# Patient Record
Sex: Female | Born: 1970 | Race: White | Hispanic: No | Smoking: Current some day smoker
Health system: Southern US, Community
[De-identification: ages and names within clinical notes are randomized; demographics above are authoritative.]

## PROBLEM LIST (undated history)

## (undated) DIAGNOSIS — F419 Anxiety disorder, unspecified: Secondary | ICD-10-CM

## (undated) DIAGNOSIS — K219 Gastro-esophageal reflux disease without esophagitis: Secondary | ICD-10-CM

## (undated) DIAGNOSIS — K589 Irritable bowel syndrome without diarrhea: Secondary | ICD-10-CM

## (undated) DIAGNOSIS — F32A Depression, unspecified: Secondary | ICD-10-CM

## (undated) DIAGNOSIS — I10 Essential (primary) hypertension: Secondary | ICD-10-CM

## (undated) DIAGNOSIS — F329 Major depressive disorder, single episode, unspecified: Secondary | ICD-10-CM

## (undated) DIAGNOSIS — M549 Dorsalgia, unspecified: Secondary | ICD-10-CM

## (undated) DIAGNOSIS — E7800 Pure hypercholesterolemia, unspecified: Secondary | ICD-10-CM

## (undated) DIAGNOSIS — M1990 Unspecified osteoarthritis, unspecified site: Secondary | ICD-10-CM

## (undated) DIAGNOSIS — E119 Type 2 diabetes mellitus without complications: Secondary | ICD-10-CM

## (undated) DIAGNOSIS — G8929 Other chronic pain: Secondary | ICD-10-CM

## (undated) HISTORY — DX: Gastro-esophageal reflux disease without esophagitis: K21.9

## (undated) HISTORY — PX: APPENDECTOMY: SHX54

## (undated) HISTORY — PX: HERNIA REPAIR: SHX51

## (undated) HISTORY — DX: Irritable bowel syndrome, unspecified: K58.9

## (undated) HISTORY — PX: TUBAL LIGATION: SHX77

## (undated) HISTORY — PX: CHOLECYSTECTOMY: SHX55

---

## 1999-10-14 ENCOUNTER — Emergency Department (HOSPITAL_COMMUNITY): Admission: EM | Admit: 1999-10-14 | Discharge: 1999-10-14 | Payer: Self-pay | Admitting: Emergency Medicine

## 1999-10-27 ENCOUNTER — Encounter: Payer: Self-pay | Admitting: Emergency Medicine

## 1999-10-27 ENCOUNTER — Emergency Department (HOSPITAL_COMMUNITY): Admission: EM | Admit: 1999-10-27 | Discharge: 1999-10-27 | Payer: Self-pay | Admitting: Emergency Medicine

## 2000-05-20 ENCOUNTER — Emergency Department (HOSPITAL_COMMUNITY): Admission: EM | Admit: 2000-05-20 | Discharge: 2000-05-20 | Payer: Self-pay | Admitting: Emergency Medicine

## 2001-04-14 ENCOUNTER — Encounter: Payer: Self-pay | Admitting: *Deleted

## 2001-04-14 ENCOUNTER — Emergency Department (HOSPITAL_COMMUNITY): Admission: EM | Admit: 2001-04-14 | Discharge: 2001-04-14 | Payer: Self-pay | Admitting: *Deleted

## 2001-04-17 ENCOUNTER — Encounter: Payer: Self-pay | Admitting: Internal Medicine

## 2001-04-17 ENCOUNTER — Ambulatory Visit (HOSPITAL_COMMUNITY): Admission: RE | Admit: 2001-04-17 | Discharge: 2001-04-17 | Payer: Self-pay | Admitting: Internal Medicine

## 2001-05-28 ENCOUNTER — Encounter: Payer: Self-pay | Admitting: Internal Medicine

## 2001-05-28 ENCOUNTER — Ambulatory Visit (HOSPITAL_COMMUNITY): Admission: RE | Admit: 2001-05-28 | Discharge: 2001-05-28 | Payer: Self-pay | Admitting: Internal Medicine

## 2001-06-15 ENCOUNTER — Emergency Department (HOSPITAL_COMMUNITY): Admission: EM | Admit: 2001-06-15 | Discharge: 2001-06-15 | Payer: Self-pay | Admitting: Emergency Medicine

## 2001-06-18 ENCOUNTER — Emergency Department (HOSPITAL_COMMUNITY): Admission: EM | Admit: 2001-06-18 | Discharge: 2001-06-18 | Payer: Self-pay | Admitting: Emergency Medicine

## 2001-06-26 ENCOUNTER — Ambulatory Visit (HOSPITAL_COMMUNITY): Admission: RE | Admit: 2001-06-26 | Discharge: 2001-06-26 | Payer: Self-pay | Admitting: Orthopedic Surgery

## 2001-06-26 ENCOUNTER — Encounter: Payer: Self-pay | Admitting: Orthopedic Surgery

## 2001-07-28 ENCOUNTER — Emergency Department (HOSPITAL_COMMUNITY): Admission: EM | Admit: 2001-07-28 | Discharge: 2001-07-28 | Payer: Self-pay | Admitting: *Deleted

## 2001-08-04 ENCOUNTER — Emergency Department (HOSPITAL_COMMUNITY): Admission: EM | Admit: 2001-08-04 | Discharge: 2001-08-04 | Payer: Self-pay | Admitting: Internal Medicine

## 2001-08-14 ENCOUNTER — Emergency Department (HOSPITAL_COMMUNITY): Admission: EM | Admit: 2001-08-14 | Discharge: 2001-08-14 | Payer: Self-pay | Admitting: Emergency Medicine

## 2001-09-08 ENCOUNTER — Emergency Department (HOSPITAL_COMMUNITY): Admission: EM | Admit: 2001-09-08 | Discharge: 2001-09-08 | Payer: Self-pay | Admitting: Internal Medicine

## 2001-09-10 ENCOUNTER — Emergency Department (HOSPITAL_COMMUNITY): Admission: EM | Admit: 2001-09-10 | Discharge: 2001-09-11 | Payer: Self-pay | Admitting: *Deleted

## 2001-10-08 ENCOUNTER — Emergency Department (HOSPITAL_COMMUNITY): Admission: EM | Admit: 2001-10-08 | Discharge: 2001-10-08 | Payer: Self-pay | Admitting: *Deleted

## 2001-10-17 HISTORY — PX: ESOPHAGOGASTRODUODENOSCOPY: SHX1529

## 2001-10-17 HISTORY — PX: COLONOSCOPY: SHX174

## 2002-01-02 ENCOUNTER — Emergency Department (HOSPITAL_COMMUNITY): Admission: EM | Admit: 2002-01-02 | Discharge: 2002-01-02 | Payer: Self-pay | Admitting: Emergency Medicine

## 2002-01-17 ENCOUNTER — Emergency Department (HOSPITAL_COMMUNITY): Admission: EM | Admit: 2002-01-17 | Discharge: 2002-01-17 | Payer: Self-pay | Admitting: *Deleted

## 2002-01-29 ENCOUNTER — Emergency Department (HOSPITAL_COMMUNITY): Admission: EM | Admit: 2002-01-29 | Discharge: 2002-01-29 | Payer: Self-pay | Admitting: Emergency Medicine

## 2002-03-04 ENCOUNTER — Emergency Department (HOSPITAL_COMMUNITY): Admission: EM | Admit: 2002-03-04 | Discharge: 2002-03-04 | Payer: Self-pay | Admitting: Emergency Medicine

## 2002-03-29 ENCOUNTER — Emergency Department (HOSPITAL_COMMUNITY): Admission: EM | Admit: 2002-03-29 | Discharge: 2002-03-29 | Payer: Self-pay | Admitting: Emergency Medicine

## 2002-04-15 ENCOUNTER — Encounter: Payer: Self-pay | Admitting: Internal Medicine

## 2002-04-15 ENCOUNTER — Ambulatory Visit (HOSPITAL_COMMUNITY): Admission: RE | Admit: 2002-04-15 | Discharge: 2002-04-15 | Payer: Self-pay | Admitting: Internal Medicine

## 2002-04-22 ENCOUNTER — Emergency Department (HOSPITAL_COMMUNITY): Admission: EM | Admit: 2002-04-22 | Discharge: 2002-04-22 | Payer: Self-pay | Admitting: *Deleted

## 2002-05-14 ENCOUNTER — Ambulatory Visit (HOSPITAL_COMMUNITY): Admission: RE | Admit: 2002-05-14 | Discharge: 2002-05-14 | Payer: Self-pay | Admitting: Internal Medicine

## 2002-05-17 ENCOUNTER — Emergency Department (HOSPITAL_COMMUNITY): Admission: EM | Admit: 2002-05-17 | Discharge: 2002-05-17 | Payer: Self-pay | Admitting: Emergency Medicine

## 2002-06-22 ENCOUNTER — Emergency Department (HOSPITAL_COMMUNITY): Admission: EM | Admit: 2002-06-22 | Discharge: 2002-06-22 | Payer: Self-pay | Admitting: Emergency Medicine

## 2002-07-25 ENCOUNTER — Emergency Department (HOSPITAL_COMMUNITY): Admission: EM | Admit: 2002-07-25 | Discharge: 2002-07-25 | Payer: Self-pay | Admitting: *Deleted

## 2002-07-28 ENCOUNTER — Emergency Department (HOSPITAL_COMMUNITY): Admission: EM | Admit: 2002-07-28 | Discharge: 2002-07-29 | Payer: Self-pay | Admitting: Emergency Medicine

## 2002-07-31 ENCOUNTER — Emergency Department (HOSPITAL_COMMUNITY): Admission: EM | Admit: 2002-07-31 | Discharge: 2002-07-31 | Payer: Self-pay | Admitting: *Deleted

## 2002-08-18 ENCOUNTER — Emergency Department (HOSPITAL_COMMUNITY): Admission: EM | Admit: 2002-08-18 | Discharge: 2002-08-18 | Payer: Self-pay | Admitting: *Deleted

## 2002-08-22 ENCOUNTER — Emergency Department (HOSPITAL_COMMUNITY): Admission: EM | Admit: 2002-08-22 | Discharge: 2002-08-23 | Payer: Self-pay | Admitting: *Deleted

## 2002-08-23 ENCOUNTER — Emergency Department (HOSPITAL_COMMUNITY): Admission: EM | Admit: 2002-08-23 | Discharge: 2002-08-23 | Payer: Self-pay | Admitting: Internal Medicine

## 2002-08-27 ENCOUNTER — Emergency Department (HOSPITAL_COMMUNITY): Admission: EM | Admit: 2002-08-27 | Discharge: 2002-08-27 | Payer: Self-pay | Admitting: Emergency Medicine

## 2002-08-30 ENCOUNTER — Emergency Department (HOSPITAL_COMMUNITY): Admission: EM | Admit: 2002-08-30 | Discharge: 2002-08-31 | Payer: Self-pay | Admitting: *Deleted

## 2002-09-03 ENCOUNTER — Emergency Department (HOSPITAL_COMMUNITY): Admission: EM | Admit: 2002-09-03 | Discharge: 2002-09-04 | Payer: Self-pay | Admitting: Emergency Medicine

## 2002-09-06 ENCOUNTER — Emergency Department (HOSPITAL_COMMUNITY): Admission: EM | Admit: 2002-09-06 | Discharge: 2002-09-07 | Payer: Self-pay | Admitting: Emergency Medicine

## 2002-09-08 ENCOUNTER — Emergency Department (HOSPITAL_COMMUNITY): Admission: EM | Admit: 2002-09-08 | Discharge: 2002-09-08 | Payer: Self-pay | Admitting: Emergency Medicine

## 2002-09-18 ENCOUNTER — Emergency Department (HOSPITAL_COMMUNITY): Admission: EM | Admit: 2002-09-18 | Discharge: 2002-09-19 | Payer: Self-pay | Admitting: Emergency Medicine

## 2002-09-26 ENCOUNTER — Emergency Department (HOSPITAL_COMMUNITY): Admission: EM | Admit: 2002-09-26 | Discharge: 2002-09-26 | Payer: Self-pay | Admitting: Internal Medicine

## 2002-10-02 ENCOUNTER — Emergency Department (HOSPITAL_COMMUNITY): Admission: EM | Admit: 2002-10-02 | Discharge: 2002-10-02 | Payer: Self-pay | Admitting: *Deleted

## 2002-10-04 ENCOUNTER — Emergency Department (HOSPITAL_COMMUNITY): Admission: EM | Admit: 2002-10-04 | Discharge: 2002-10-04 | Payer: Self-pay | Admitting: Emergency Medicine

## 2002-10-13 ENCOUNTER — Emergency Department (HOSPITAL_COMMUNITY): Admission: EM | Admit: 2002-10-13 | Discharge: 2002-10-13 | Payer: Self-pay | Admitting: Emergency Medicine

## 2002-10-19 ENCOUNTER — Emergency Department (HOSPITAL_COMMUNITY): Admission: EM | Admit: 2002-10-19 | Discharge: 2002-10-20 | Payer: Self-pay | Admitting: Emergency Medicine

## 2002-11-14 ENCOUNTER — Emergency Department (HOSPITAL_COMMUNITY): Admission: EM | Admit: 2002-11-14 | Discharge: 2002-11-14 | Payer: Self-pay | Admitting: Emergency Medicine

## 2002-12-10 ENCOUNTER — Encounter: Payer: Self-pay | Admitting: Emergency Medicine

## 2002-12-10 ENCOUNTER — Emergency Department (HOSPITAL_COMMUNITY): Admission: EM | Admit: 2002-12-10 | Discharge: 2002-12-10 | Payer: Self-pay | Admitting: Emergency Medicine

## 2002-12-17 ENCOUNTER — Emergency Department (HOSPITAL_COMMUNITY): Admission: EM | Admit: 2002-12-17 | Discharge: 2002-12-18 | Payer: Self-pay | Admitting: Emergency Medicine

## 2002-12-28 ENCOUNTER — Emergency Department (HOSPITAL_COMMUNITY): Admission: EM | Admit: 2002-12-28 | Discharge: 2002-12-28 | Payer: Self-pay | Admitting: Emergency Medicine

## 2003-01-20 ENCOUNTER — Emergency Department (HOSPITAL_COMMUNITY): Admission: EM | Admit: 2003-01-20 | Discharge: 2003-01-21 | Payer: Self-pay | Admitting: Emergency Medicine

## 2003-01-27 ENCOUNTER — Emergency Department (HOSPITAL_COMMUNITY): Admission: EM | Admit: 2003-01-27 | Discharge: 2003-01-28 | Payer: Self-pay | Admitting: Emergency Medicine

## 2003-01-28 ENCOUNTER — Encounter: Payer: Self-pay | Admitting: Emergency Medicine

## 2003-02-24 ENCOUNTER — Emergency Department (HOSPITAL_COMMUNITY): Admission: EM | Admit: 2003-02-24 | Discharge: 2003-02-25 | Payer: Self-pay | Admitting: Emergency Medicine

## 2003-03-25 ENCOUNTER — Emergency Department (HOSPITAL_COMMUNITY): Admission: EM | Admit: 2003-03-25 | Discharge: 2003-03-26 | Payer: Self-pay | Admitting: Emergency Medicine

## 2003-04-11 ENCOUNTER — Emergency Department (HOSPITAL_COMMUNITY): Admission: EM | Admit: 2003-04-11 | Discharge: 2003-04-11 | Payer: Self-pay | Admitting: *Deleted

## 2003-04-12 ENCOUNTER — Emergency Department (HOSPITAL_COMMUNITY): Admission: EM | Admit: 2003-04-12 | Discharge: 2003-04-13 | Payer: Self-pay | Admitting: *Deleted

## 2003-04-14 ENCOUNTER — Emergency Department (HOSPITAL_COMMUNITY): Admission: EM | Admit: 2003-04-14 | Discharge: 2003-04-14 | Payer: Self-pay | Admitting: Emergency Medicine

## 2003-04-14 ENCOUNTER — Encounter: Payer: Self-pay | Admitting: Emergency Medicine

## 2003-04-17 ENCOUNTER — Emergency Department (HOSPITAL_COMMUNITY): Admission: EM | Admit: 2003-04-17 | Discharge: 2003-04-18 | Payer: Self-pay | Admitting: Emergency Medicine

## 2003-05-24 ENCOUNTER — Emergency Department (HOSPITAL_COMMUNITY): Admission: EM | Admit: 2003-05-24 | Discharge: 2003-05-24 | Payer: Self-pay | Admitting: Emergency Medicine

## 2003-05-25 ENCOUNTER — Emergency Department (HOSPITAL_COMMUNITY): Admission: AD | Admit: 2003-05-25 | Discharge: 2003-05-25 | Payer: Self-pay | Admitting: Emergency Medicine

## 2003-05-27 ENCOUNTER — Encounter: Payer: Self-pay | Admitting: *Deleted

## 2003-05-27 ENCOUNTER — Emergency Department (HOSPITAL_COMMUNITY): Admission: EM | Admit: 2003-05-27 | Discharge: 2003-05-27 | Payer: Self-pay | Admitting: *Deleted

## 2003-05-31 ENCOUNTER — Emergency Department (HOSPITAL_COMMUNITY): Admission: EM | Admit: 2003-05-31 | Discharge: 2003-05-31 | Payer: Self-pay | Admitting: Emergency Medicine

## 2003-06-04 ENCOUNTER — Emergency Department (HOSPITAL_COMMUNITY): Admission: EM | Admit: 2003-06-04 | Discharge: 2003-06-04 | Payer: Self-pay | Admitting: Emergency Medicine

## 2003-06-06 ENCOUNTER — Emergency Department (HOSPITAL_COMMUNITY): Admission: EM | Admit: 2003-06-06 | Discharge: 2003-06-07 | Payer: Self-pay | Admitting: Emergency Medicine

## 2003-06-19 ENCOUNTER — Emergency Department (HOSPITAL_COMMUNITY): Admission: EM | Admit: 2003-06-19 | Discharge: 2003-06-19 | Payer: Self-pay | Admitting: Emergency Medicine

## 2003-06-19 ENCOUNTER — Emergency Department (HOSPITAL_COMMUNITY): Admission: EM | Admit: 2003-06-19 | Discharge: 2003-06-20 | Payer: Self-pay | Admitting: *Deleted

## 2003-06-21 ENCOUNTER — Emergency Department (HOSPITAL_COMMUNITY): Admission: RE | Admit: 2003-06-21 | Discharge: 2003-06-21 | Payer: Self-pay

## 2003-06-23 ENCOUNTER — Emergency Department (HOSPITAL_COMMUNITY): Admission: EM | Admit: 2003-06-23 | Discharge: 2003-06-23 | Payer: Self-pay | Admitting: *Deleted

## 2003-06-23 ENCOUNTER — Encounter: Payer: Self-pay | Admitting: *Deleted

## 2003-06-25 ENCOUNTER — Emergency Department (HOSPITAL_COMMUNITY): Admission: EM | Admit: 2003-06-25 | Discharge: 2003-06-26 | Payer: Self-pay

## 2003-06-26 ENCOUNTER — Encounter: Payer: Self-pay | Admitting: Emergency Medicine

## 2003-07-09 ENCOUNTER — Emergency Department (HOSPITAL_COMMUNITY): Admission: EM | Admit: 2003-07-09 | Discharge: 2003-07-09 | Payer: Self-pay | Admitting: Emergency Medicine

## 2003-07-23 ENCOUNTER — Encounter: Payer: Self-pay | Admitting: Emergency Medicine

## 2003-07-23 ENCOUNTER — Emergency Department (HOSPITAL_COMMUNITY): Admission: EM | Admit: 2003-07-23 | Discharge: 2003-07-23 | Payer: Self-pay | Admitting: Emergency Medicine

## 2003-07-30 ENCOUNTER — Emergency Department (HOSPITAL_COMMUNITY): Admission: EM | Admit: 2003-07-30 | Discharge: 2003-07-30 | Payer: Self-pay | Admitting: Emergency Medicine

## 2003-08-07 ENCOUNTER — Emergency Department (HOSPITAL_COMMUNITY): Admission: EM | Admit: 2003-08-07 | Discharge: 2003-08-07 | Payer: Self-pay | Admitting: Emergency Medicine

## 2003-08-29 ENCOUNTER — Emergency Department (HOSPITAL_COMMUNITY): Admission: EM | Admit: 2003-08-29 | Discharge: 2003-08-29 | Payer: Self-pay | Admitting: *Deleted

## 2003-08-30 ENCOUNTER — Emergency Department (HOSPITAL_COMMUNITY): Admission: EM | Admit: 2003-08-30 | Discharge: 2003-08-30 | Payer: Self-pay | Admitting: Emergency Medicine

## 2003-09-02 ENCOUNTER — Emergency Department (HOSPITAL_COMMUNITY): Admission: EM | Admit: 2003-09-02 | Discharge: 2003-09-02 | Payer: Self-pay | Admitting: Emergency Medicine

## 2003-09-19 ENCOUNTER — Inpatient Hospital Stay (HOSPITAL_COMMUNITY): Admission: EM | Admit: 2003-09-19 | Discharge: 2003-09-21 | Payer: Self-pay | Admitting: Emergency Medicine

## 2003-09-23 ENCOUNTER — Emergency Department (HOSPITAL_COMMUNITY): Admission: AD | Admit: 2003-09-23 | Discharge: 2003-09-23 | Payer: Self-pay | Admitting: Family Medicine

## 2003-10-11 ENCOUNTER — Emergency Department (HOSPITAL_COMMUNITY): Admission: EM | Admit: 2003-10-11 | Discharge: 2003-10-11 | Payer: Self-pay | Admitting: Emergency Medicine

## 2003-10-18 ENCOUNTER — Emergency Department (HOSPITAL_COMMUNITY): Admission: EM | Admit: 2003-10-18 | Discharge: 2003-10-18 | Payer: Self-pay | Admitting: Emergency Medicine

## 2003-11-26 ENCOUNTER — Emergency Department (HOSPITAL_COMMUNITY): Admission: EM | Admit: 2003-11-26 | Discharge: 2003-11-27 | Payer: Self-pay | Admitting: Emergency Medicine

## 2003-11-29 ENCOUNTER — Emergency Department (HOSPITAL_COMMUNITY): Admission: EM | Admit: 2003-11-29 | Discharge: 2003-11-29 | Payer: Self-pay | Admitting: Emergency Medicine

## 2003-12-14 ENCOUNTER — Emergency Department (HOSPITAL_COMMUNITY): Admission: EM | Admit: 2003-12-14 | Discharge: 2003-12-14 | Payer: Self-pay | Admitting: Emergency Medicine

## 2003-12-24 ENCOUNTER — Emergency Department (HOSPITAL_COMMUNITY): Admission: EM | Admit: 2003-12-24 | Discharge: 2003-12-24 | Payer: Self-pay | Admitting: Emergency Medicine

## 2003-12-28 ENCOUNTER — Emergency Department (HOSPITAL_COMMUNITY): Admission: EM | Admit: 2003-12-28 | Discharge: 2003-12-28 | Payer: Self-pay | Admitting: Emergency Medicine

## 2003-12-30 ENCOUNTER — Emergency Department (HOSPITAL_COMMUNITY): Admission: EM | Admit: 2003-12-30 | Discharge: 2003-12-31 | Payer: Self-pay | Admitting: Emergency Medicine

## 2004-01-15 ENCOUNTER — Emergency Department (HOSPITAL_COMMUNITY): Admission: EM | Admit: 2004-01-15 | Discharge: 2004-01-15 | Payer: Self-pay | Admitting: Emergency Medicine

## 2004-01-15 IMAGING — CR DG ABDOMEN 1V
2 series · 2 of 2 positions shown · non-contrast
Comparison: none

CLINICAL DATA: Abdominal pain.
 ONE VIEW ABDOMEN
 The bowel gas pattern is normal.  Surgical clips are seen within the right upper quadrant.  There has been a previous tubal ligation.  No definite renal or ureteral calculi.
 IMPRESSION
 No evidence for active disease.

[view not recorded (1 of 2)]
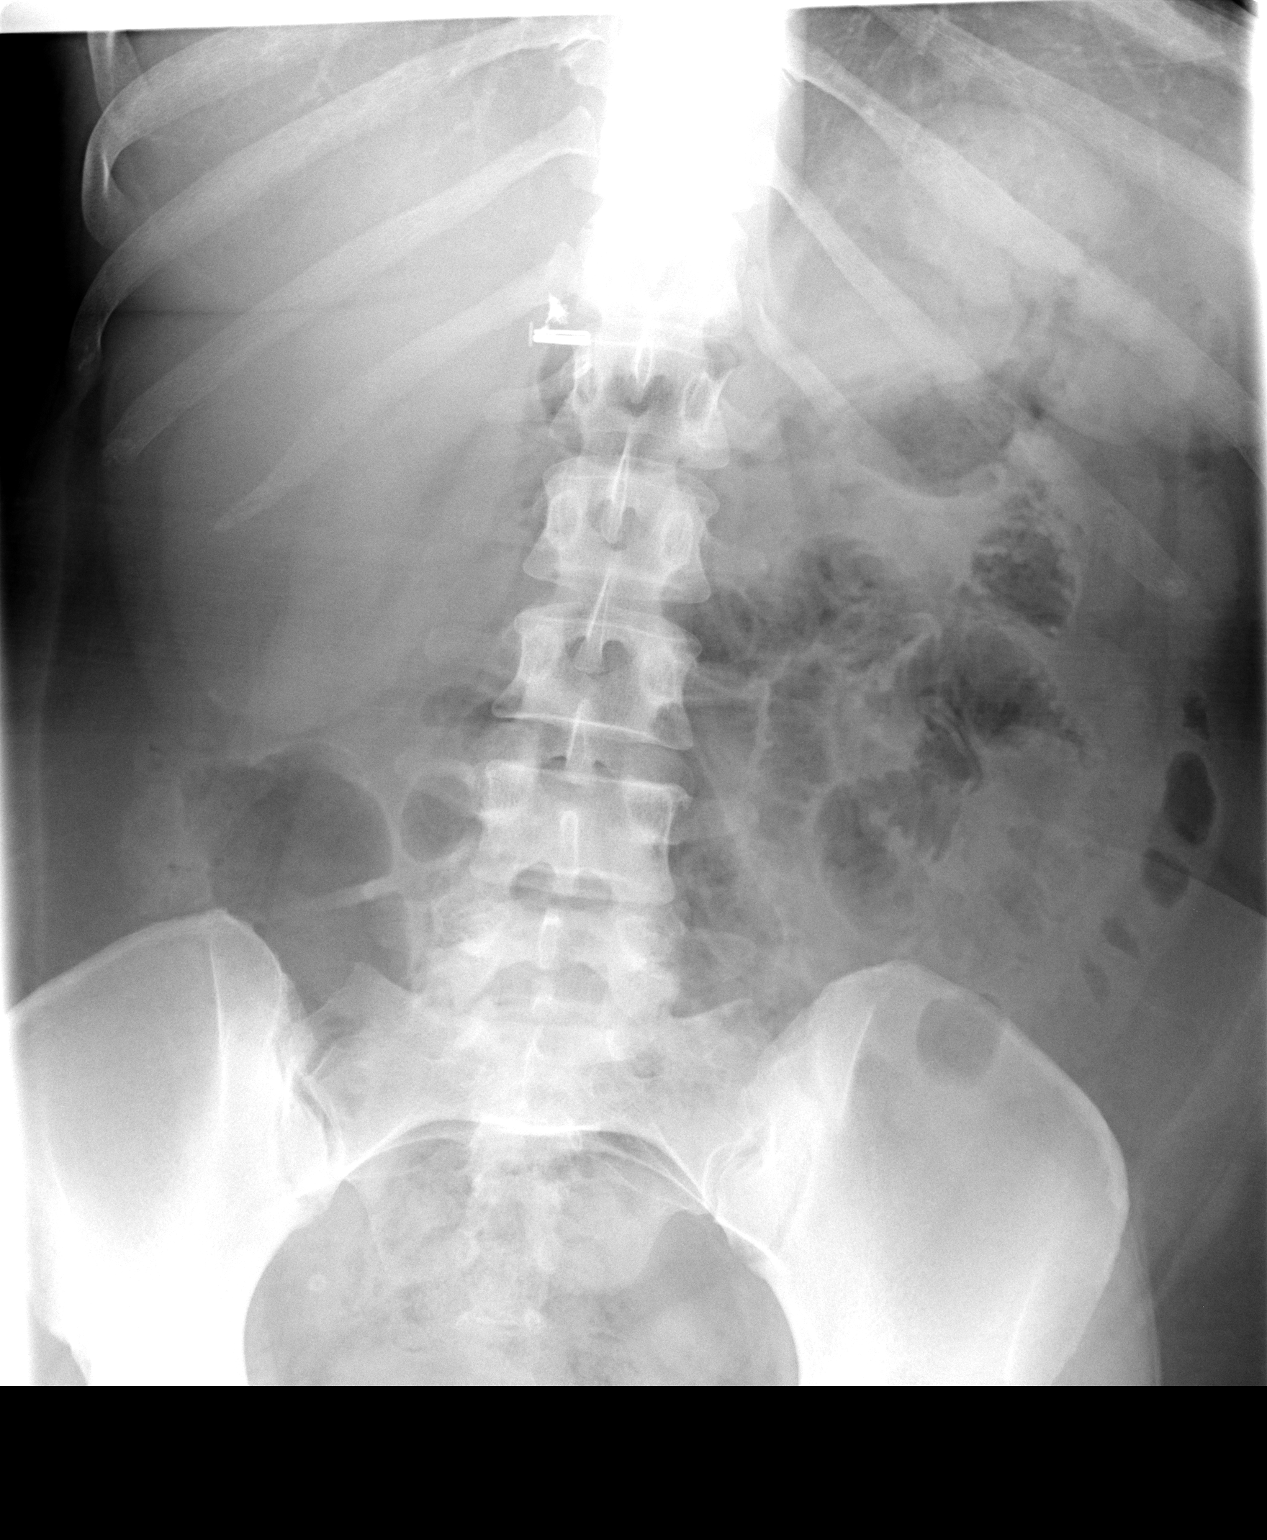

[view not recorded (2 of 2)]
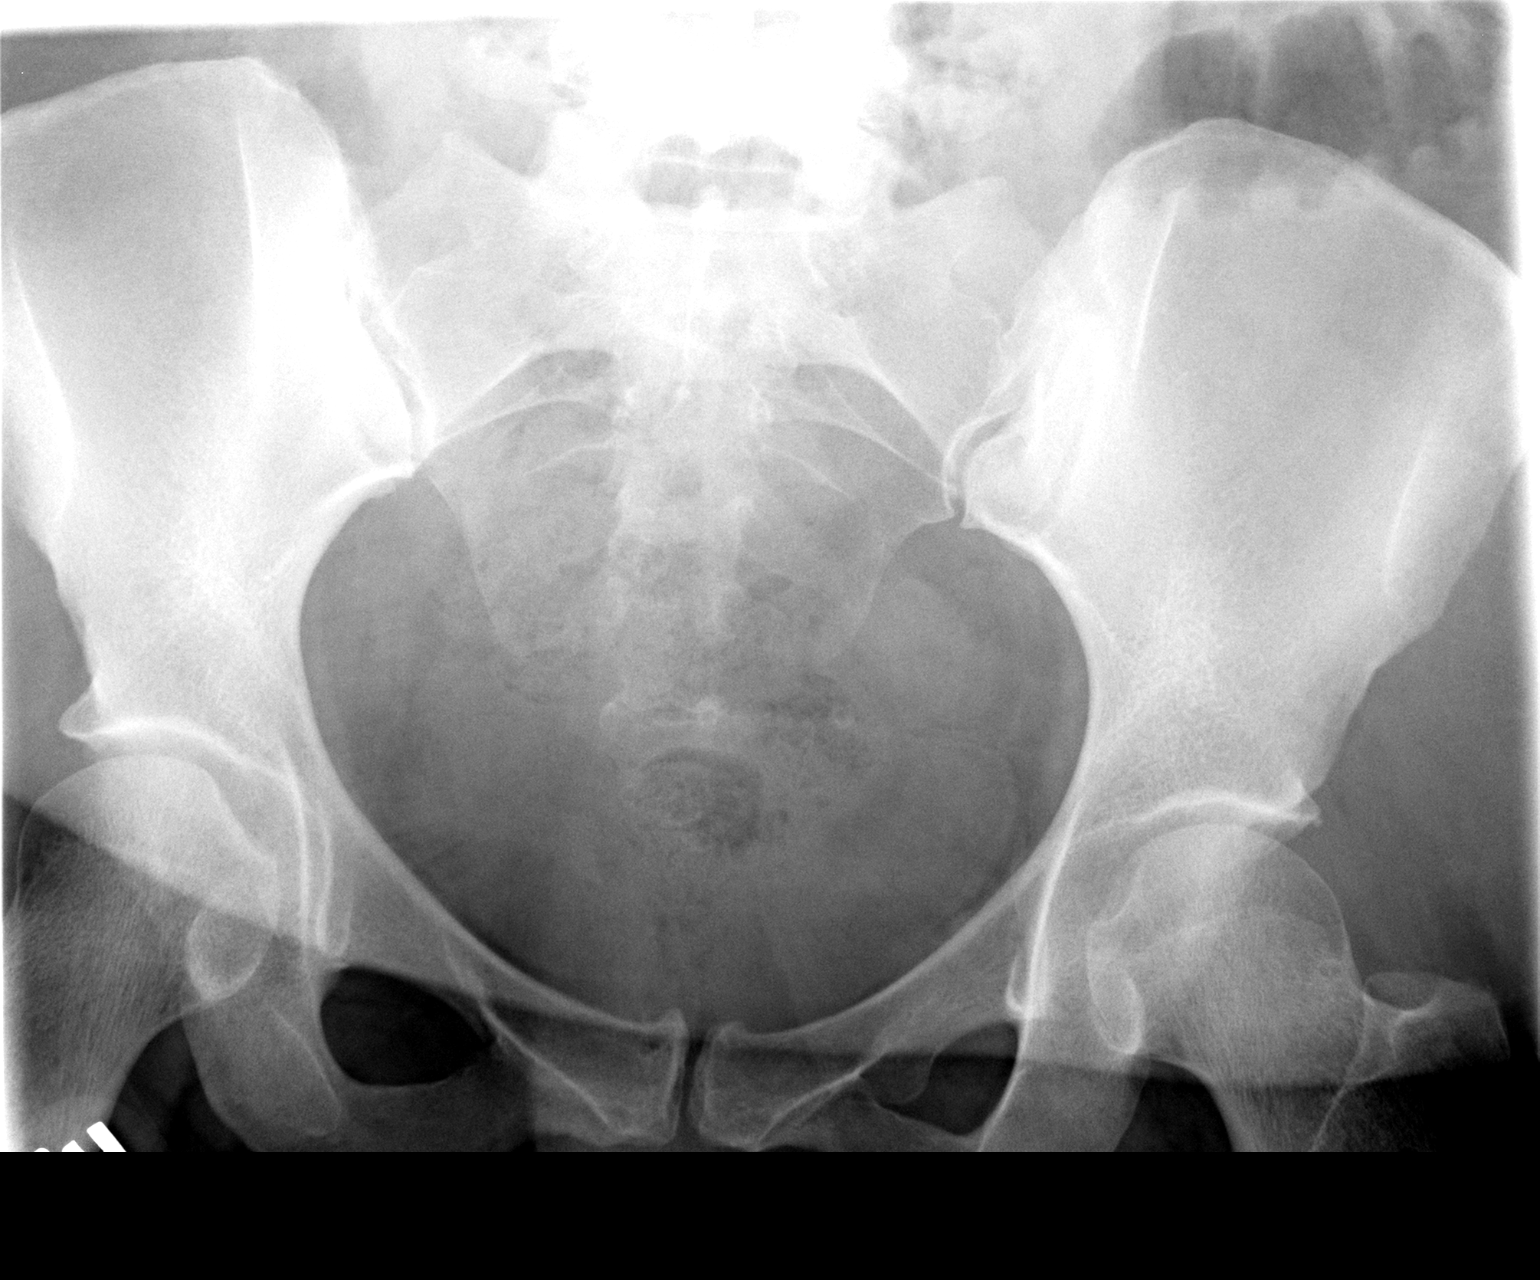

[2 of 2 positions shown; findings below may reference images not displayed]

## 2004-01-21 ENCOUNTER — Emergency Department (HOSPITAL_COMMUNITY): Admission: EM | Admit: 2004-01-21 | Discharge: 2004-01-21 | Payer: Self-pay | Admitting: Emergency Medicine

## 2004-01-31 ENCOUNTER — Emergency Department (HOSPITAL_COMMUNITY): Admission: EM | Admit: 2004-01-31 | Discharge: 2004-02-01 | Payer: Self-pay | Admitting: Emergency Medicine

## 2004-03-01 ENCOUNTER — Emergency Department (HOSPITAL_COMMUNITY): Admission: EM | Admit: 2004-03-01 | Discharge: 2004-03-02 | Payer: Self-pay | Admitting: Emergency Medicine

## 2004-03-02 IMAGING — CR DG CHEST 2V
2 series · 2 of 2 positions shown · non-contrast
Comparison: none

CLINICAL DATA: Cough.
 CHEST TWO VIEWS 
 Comparison [DATE]. 
 Normal heart size, mediastinal contours, and vascularity.  Minimal chronic accentuation of medial right lung base markings stable.  Lungs clear.  No effusion or pneumothorax. 
 IMPRESSION
 No acute abnormalities.

[view not recorded (1 of 2)]
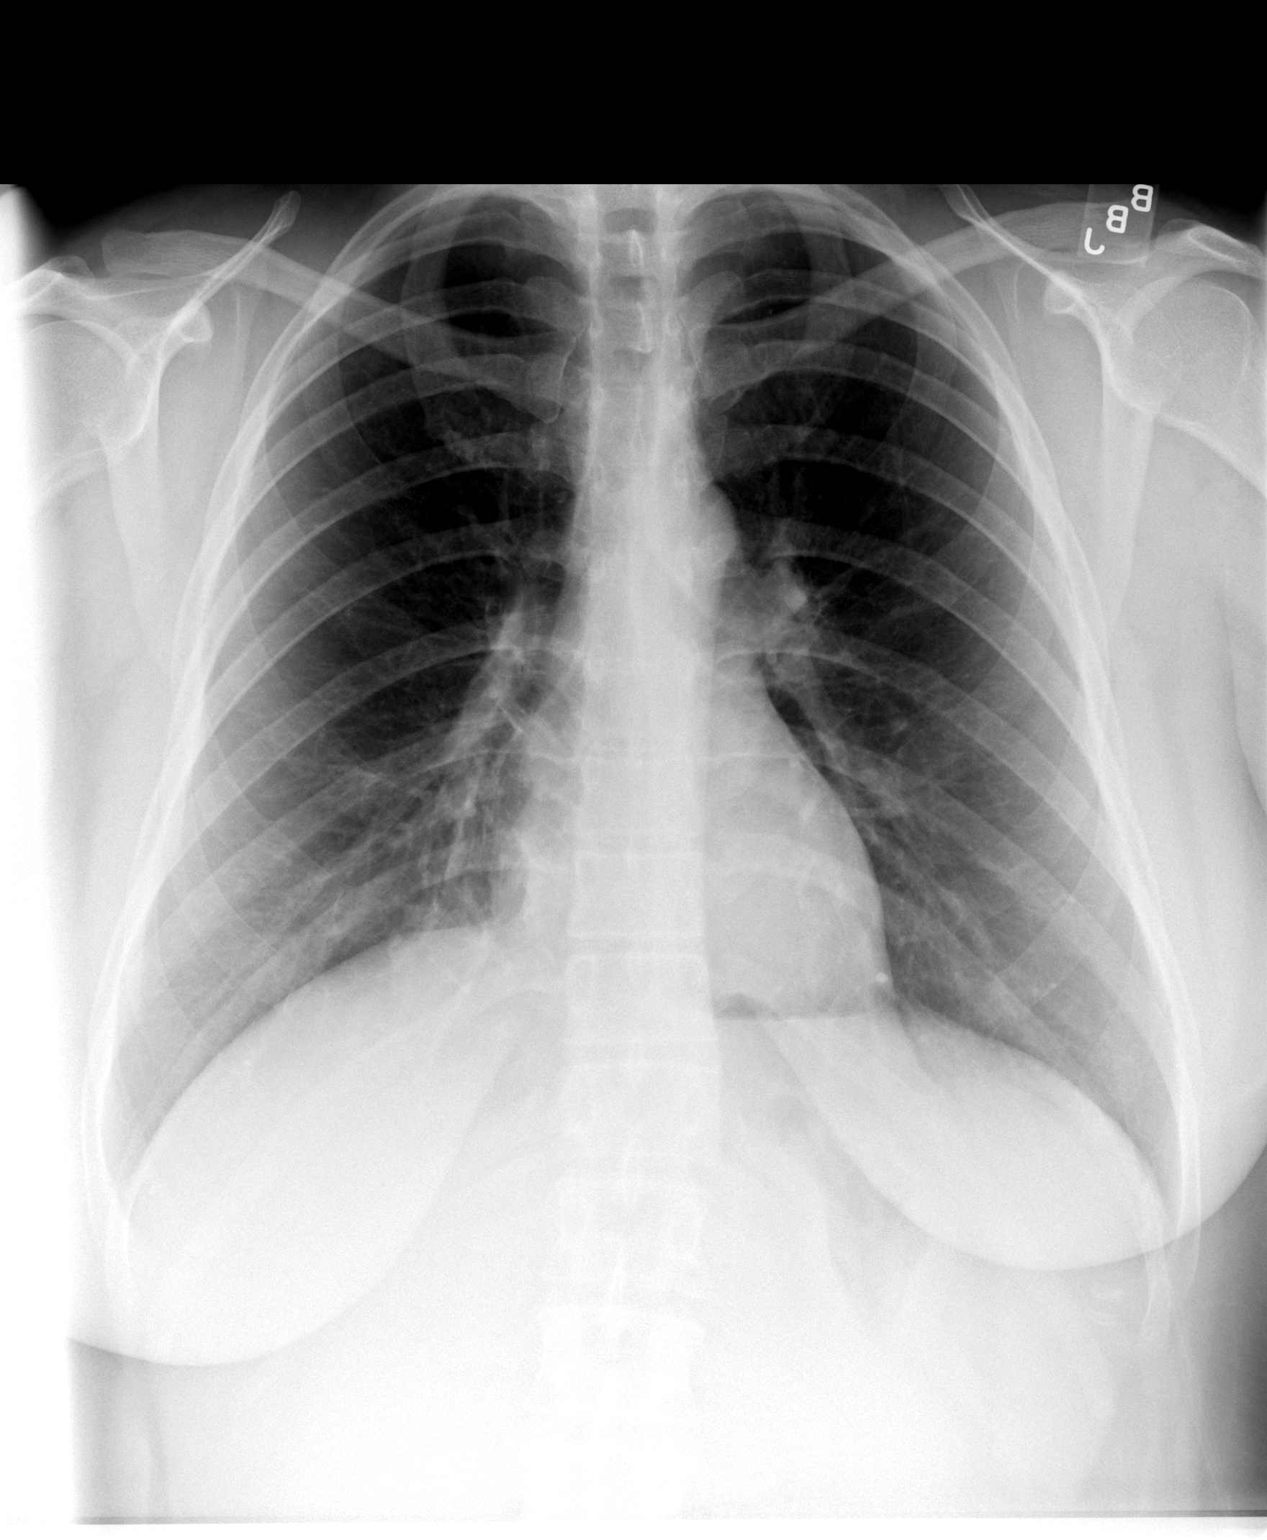

[view not recorded (2 of 2)]
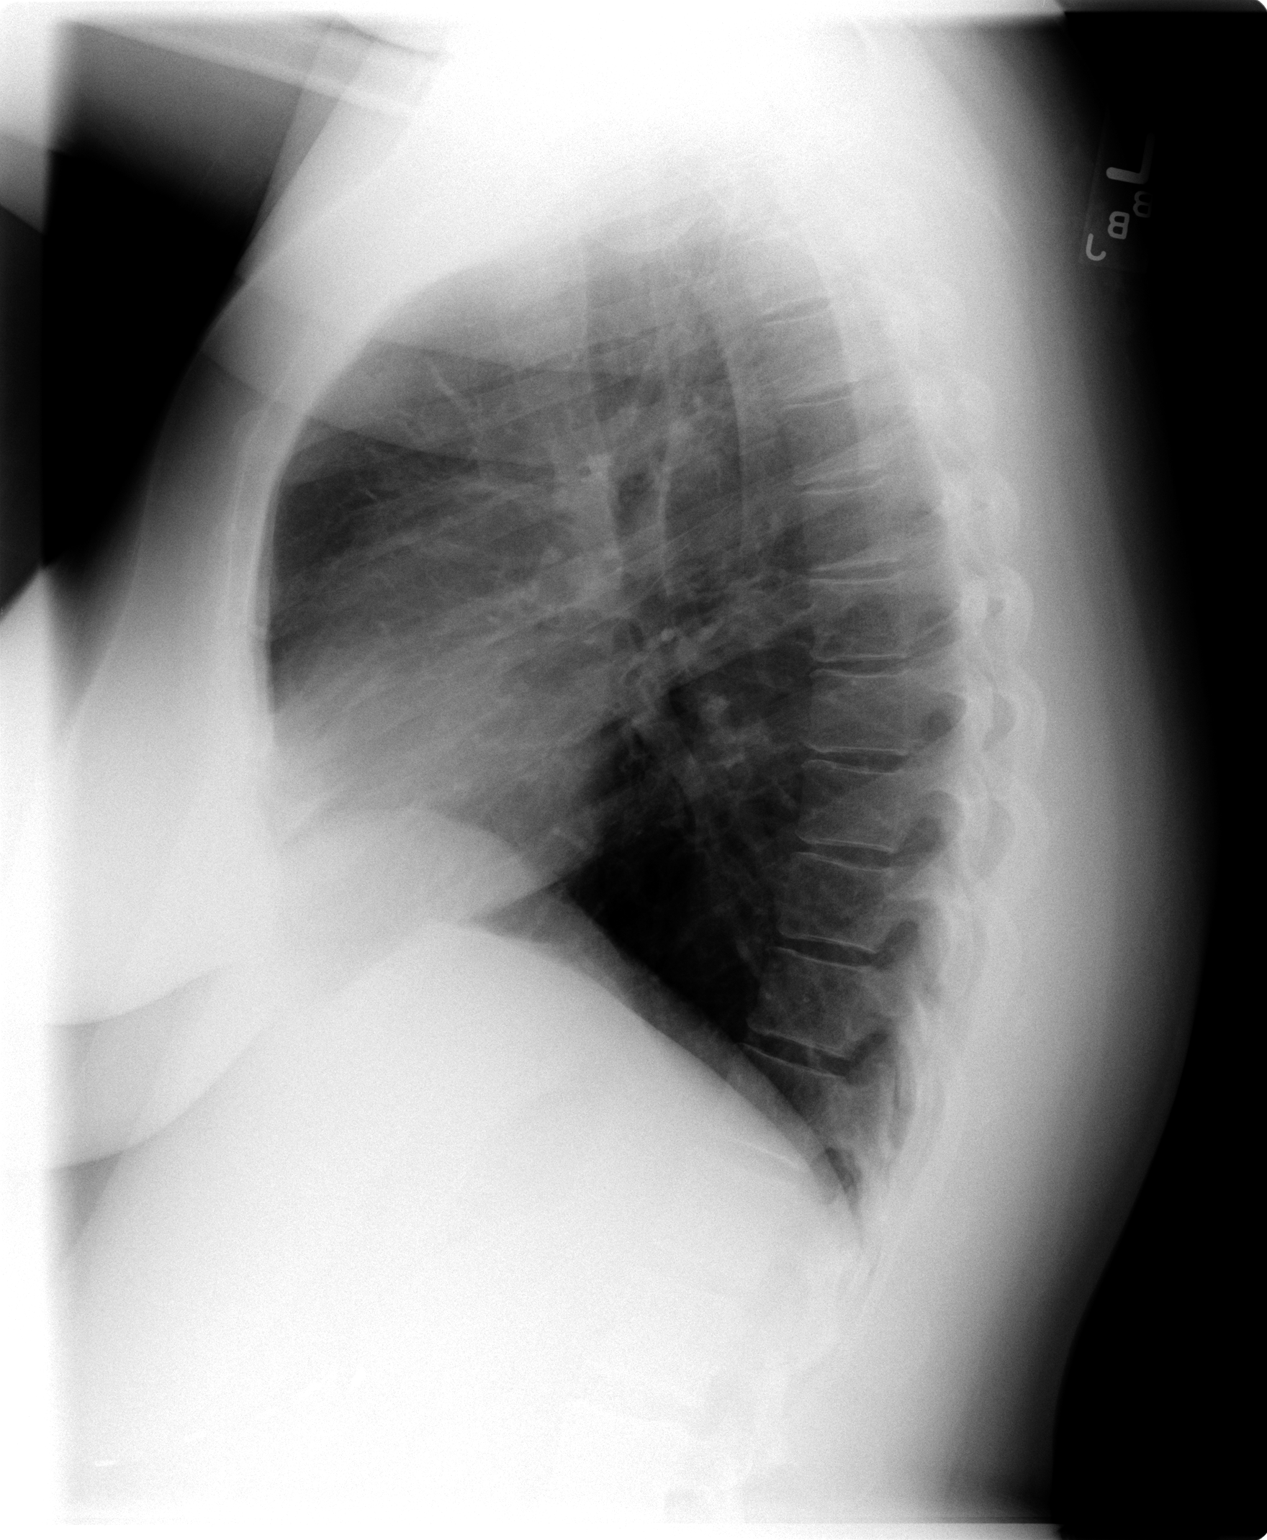

[2 of 2 positions shown; findings below may reference images not displayed]

## 2004-03-08 ENCOUNTER — Emergency Department (HOSPITAL_COMMUNITY): Admission: EM | Admit: 2004-03-08 | Discharge: 2004-03-09 | Payer: Self-pay | Admitting: Emergency Medicine

## 2004-03-09 ENCOUNTER — Emergency Department (HOSPITAL_COMMUNITY): Admission: EM | Admit: 2004-03-09 | Discharge: 2004-03-09 | Payer: Self-pay | Admitting: *Deleted

## 2004-03-18 ENCOUNTER — Emergency Department (HOSPITAL_COMMUNITY): Admission: EM | Admit: 2004-03-18 | Discharge: 2004-03-18 | Payer: Self-pay | Admitting: Emergency Medicine

## 2004-03-18 IMAGING — CR DG CHEST 2V
2 series · 2 of 2 positions shown · non-contrast
Comparison: [DATE].

CLINICAL DATA: Shortness of breath.
 PA AND LATERAL CHEST, [DATE]

[view not recorded (1 of 2)]
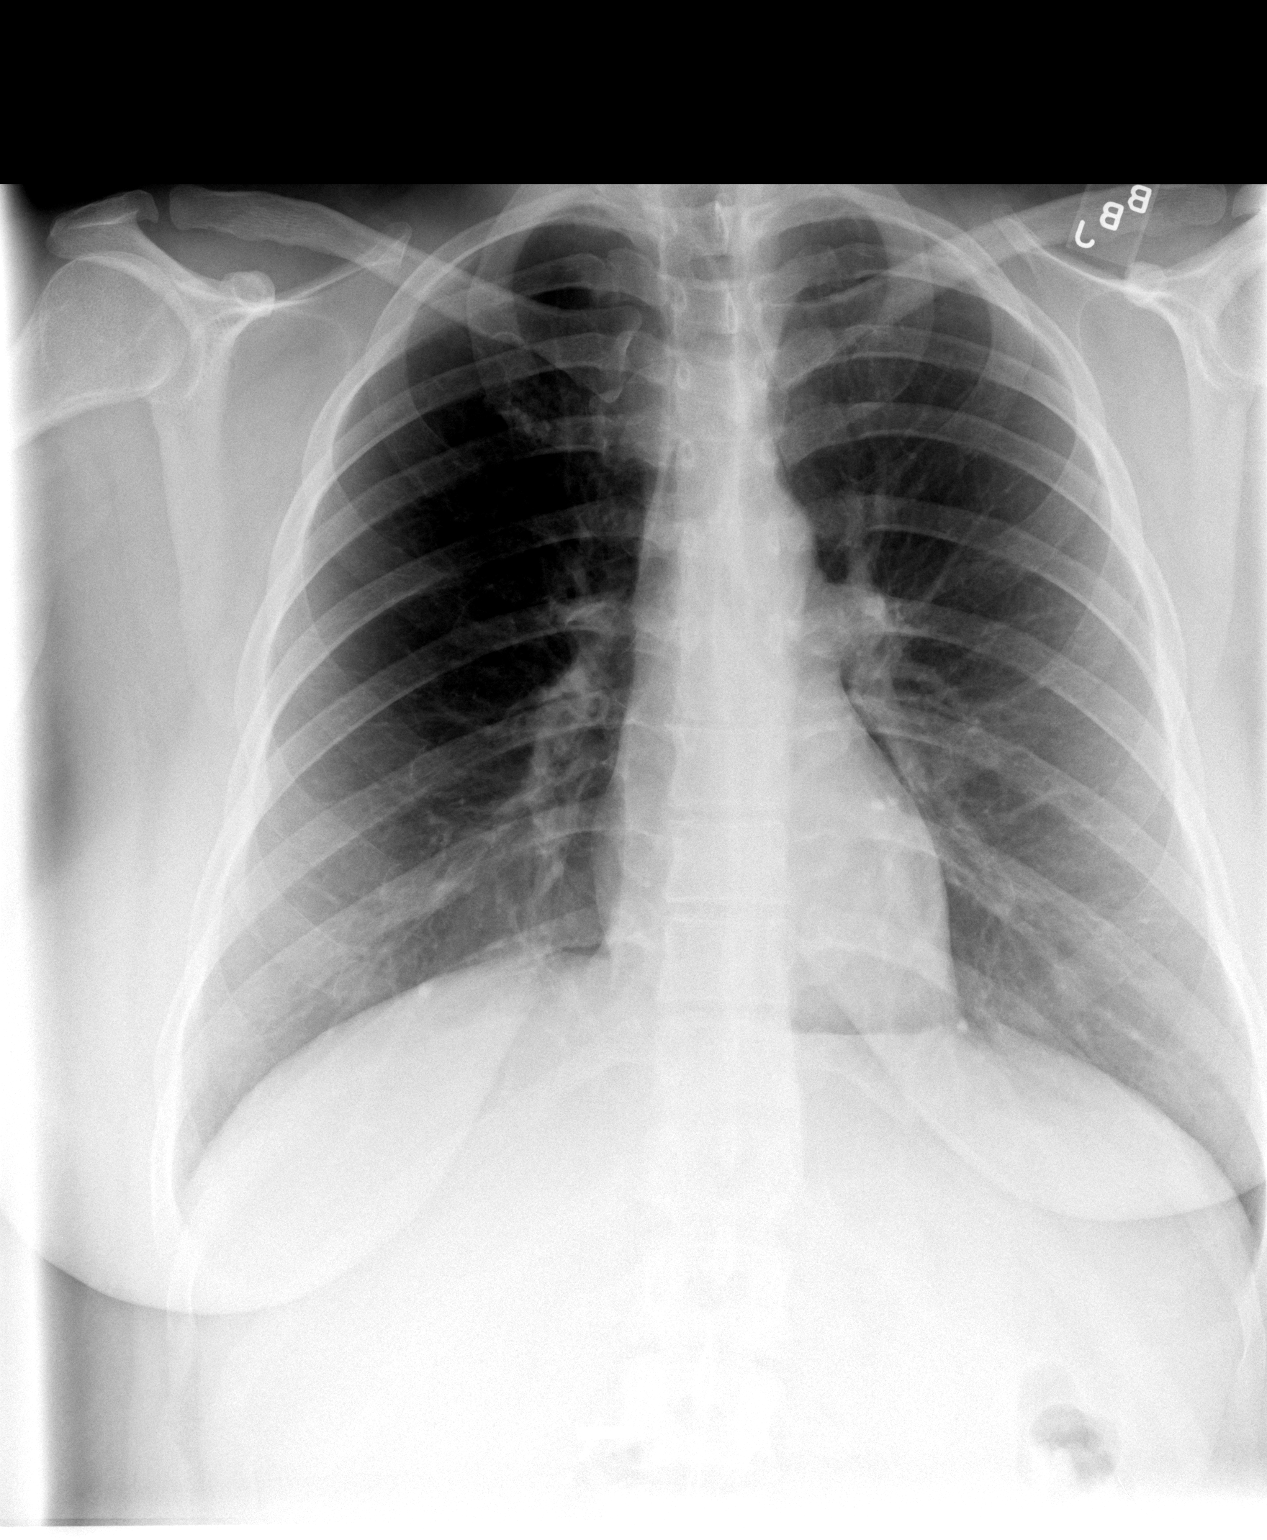

[view not recorded (2 of 2)]
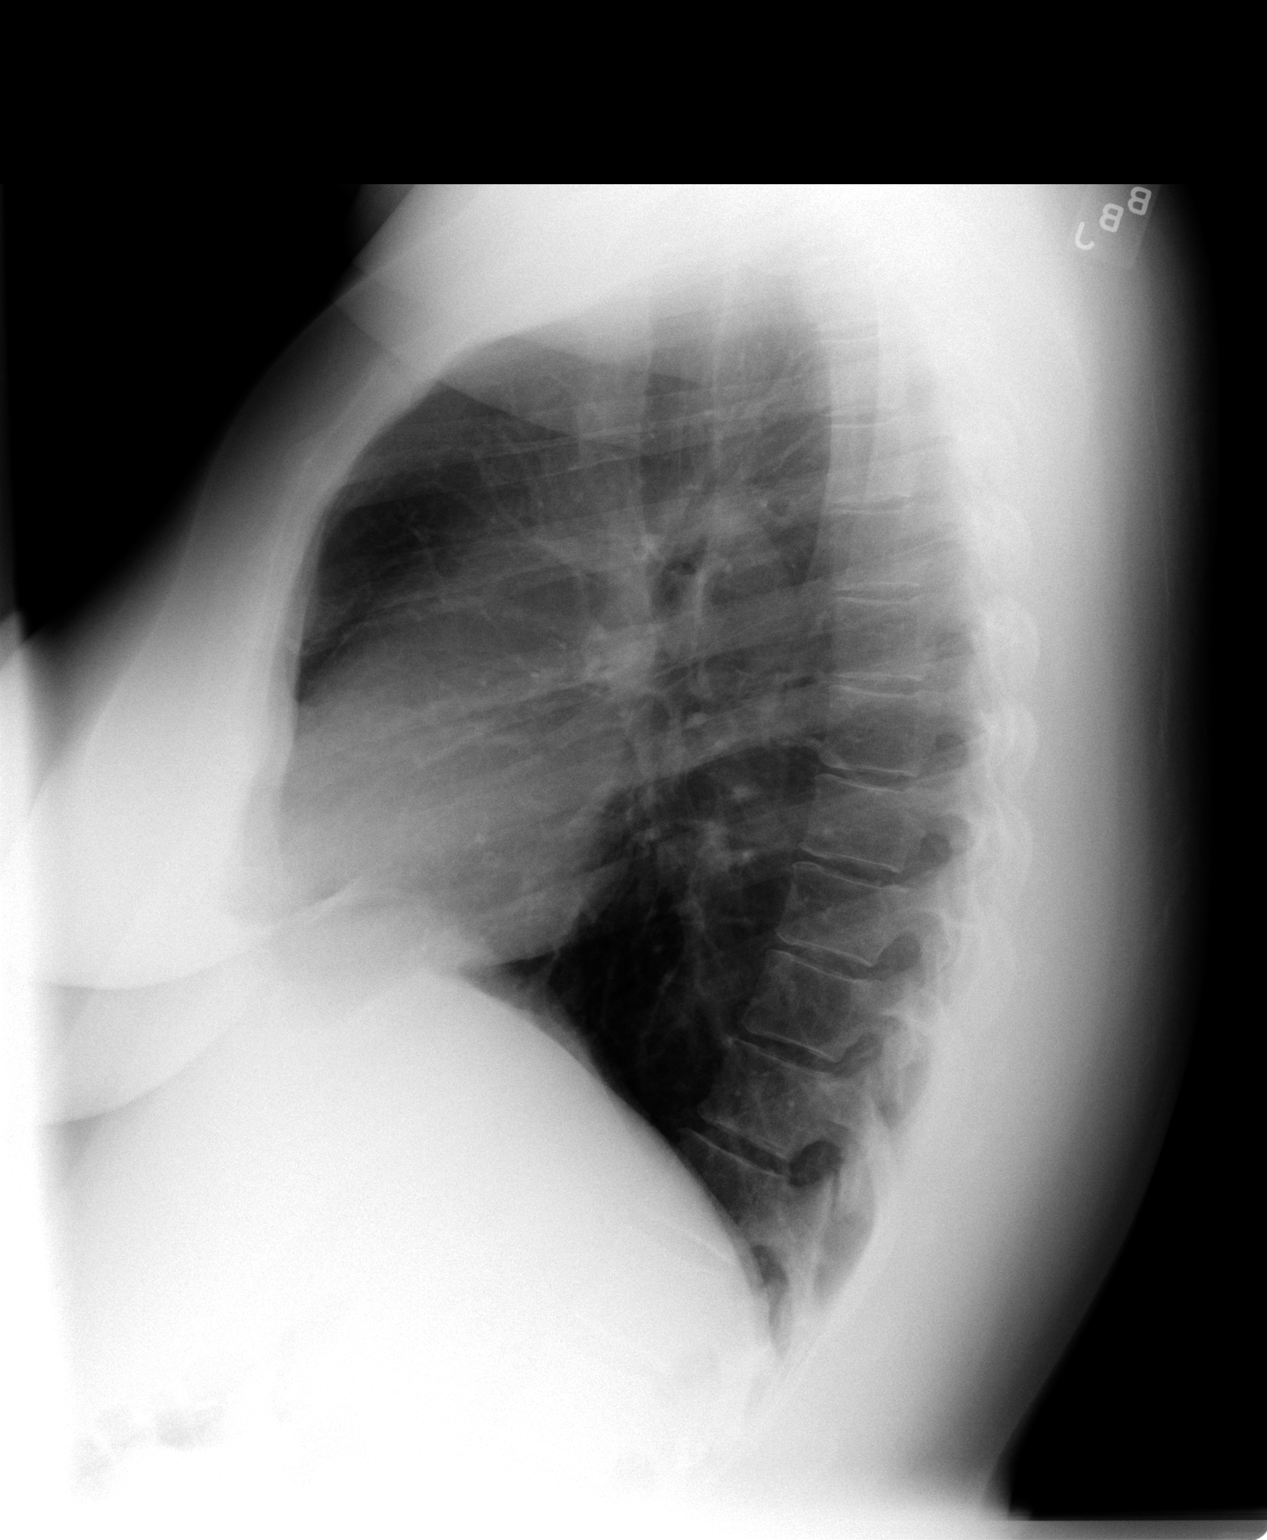

[2 of 2 positions shown; findings below may reference images not displayed]

The heart size and mediastinal contours are normal. The lungs are clear. The visualized skeleton is unremarkable.
 IMPRESSION
 No active disease.

## 2004-04-02 ENCOUNTER — Emergency Department (HOSPITAL_COMMUNITY): Admission: EM | Admit: 2004-04-02 | Discharge: 2004-04-03 | Payer: Self-pay | Admitting: Emergency Medicine

## 2004-04-14 ENCOUNTER — Emergency Department (HOSPITAL_COMMUNITY): Admission: EM | Admit: 2004-04-14 | Discharge: 2004-04-14 | Payer: Self-pay | Admitting: Emergency Medicine

## 2004-04-18 IMAGING — CR DG ABDOMEN 1V
2 series · 2 of 2 positions shown · non-contrast
Comparison: none

CLINICAL DATA: Stomach virus; vomiting
 KUB
 Surgical clips right upper quadrant, question cholecystectomy.  Nonspecific bowel gas pattern without signs of obstruction, wall thickening, or perforation.  No urinary tract calcification or acute skeletal abnormality.  Bilateral tubal ligation rings in pelvis.
 IMPRESSION
 Nonspecific gas pattern.

[view not recorded (1 of 2)]
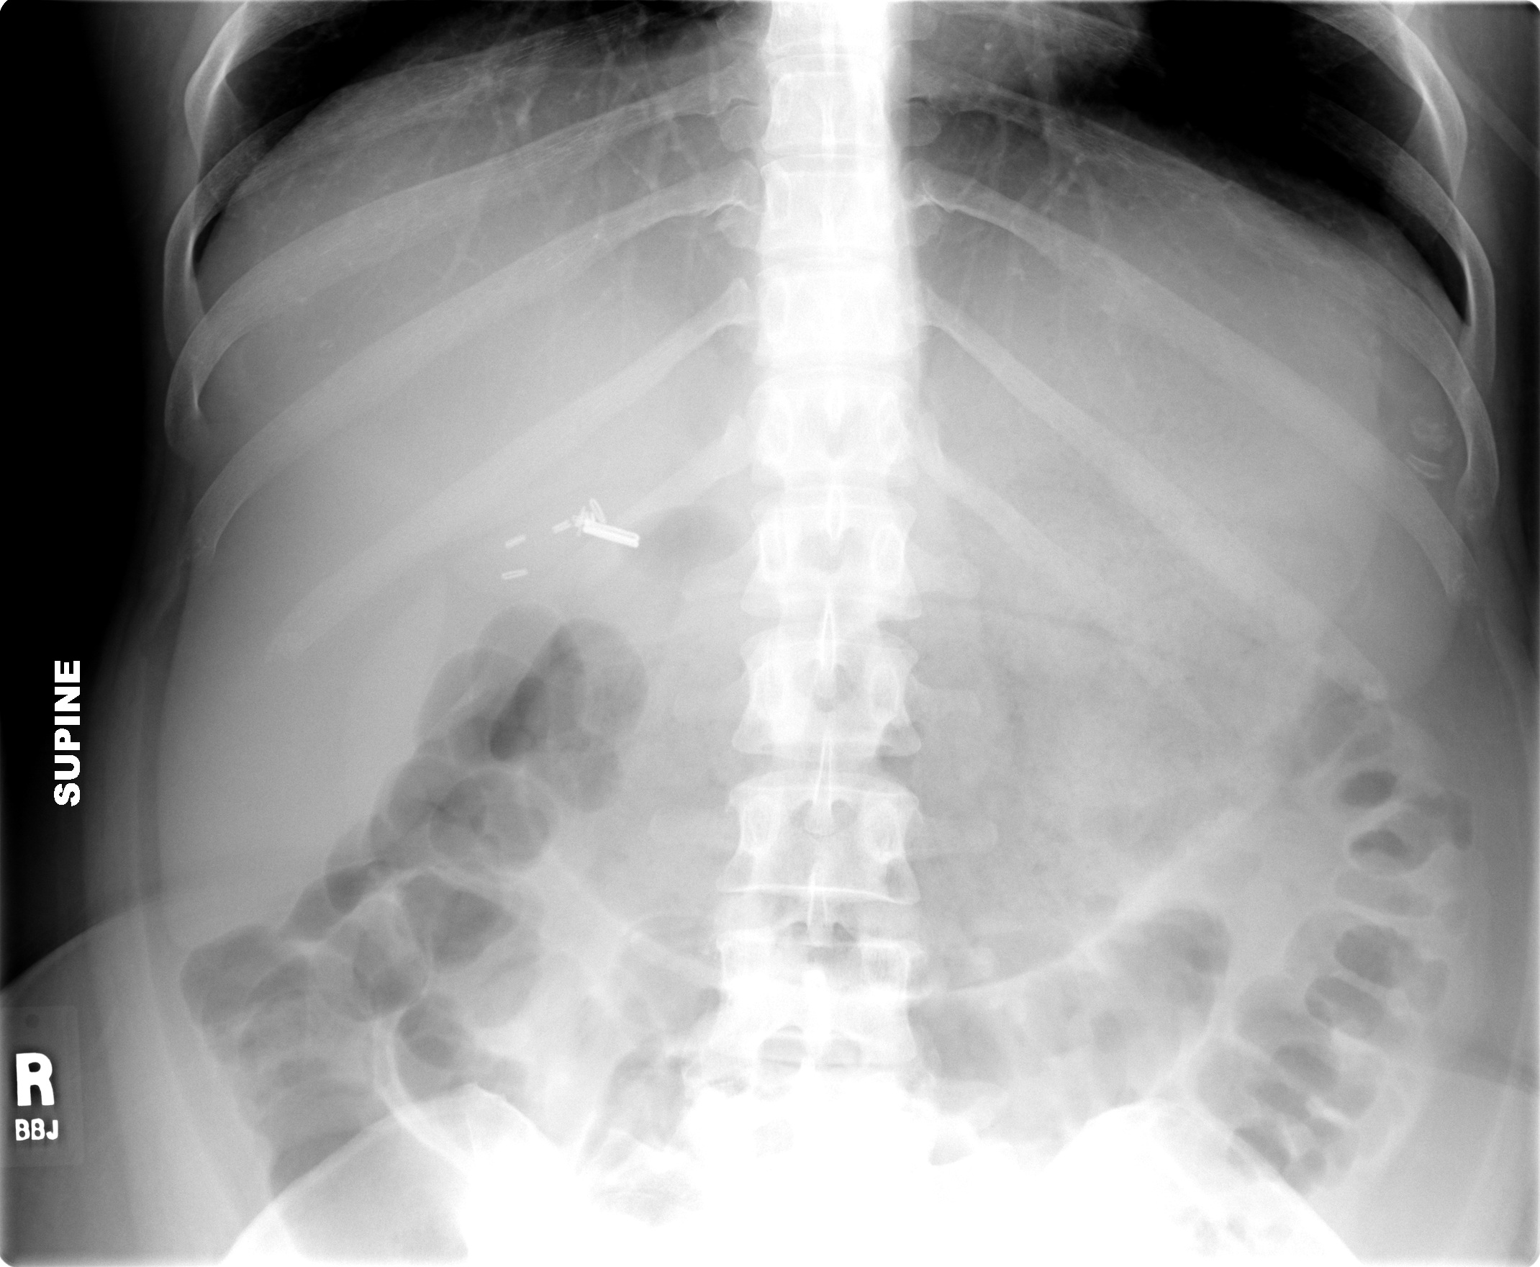

[view not recorded (2 of 2)]
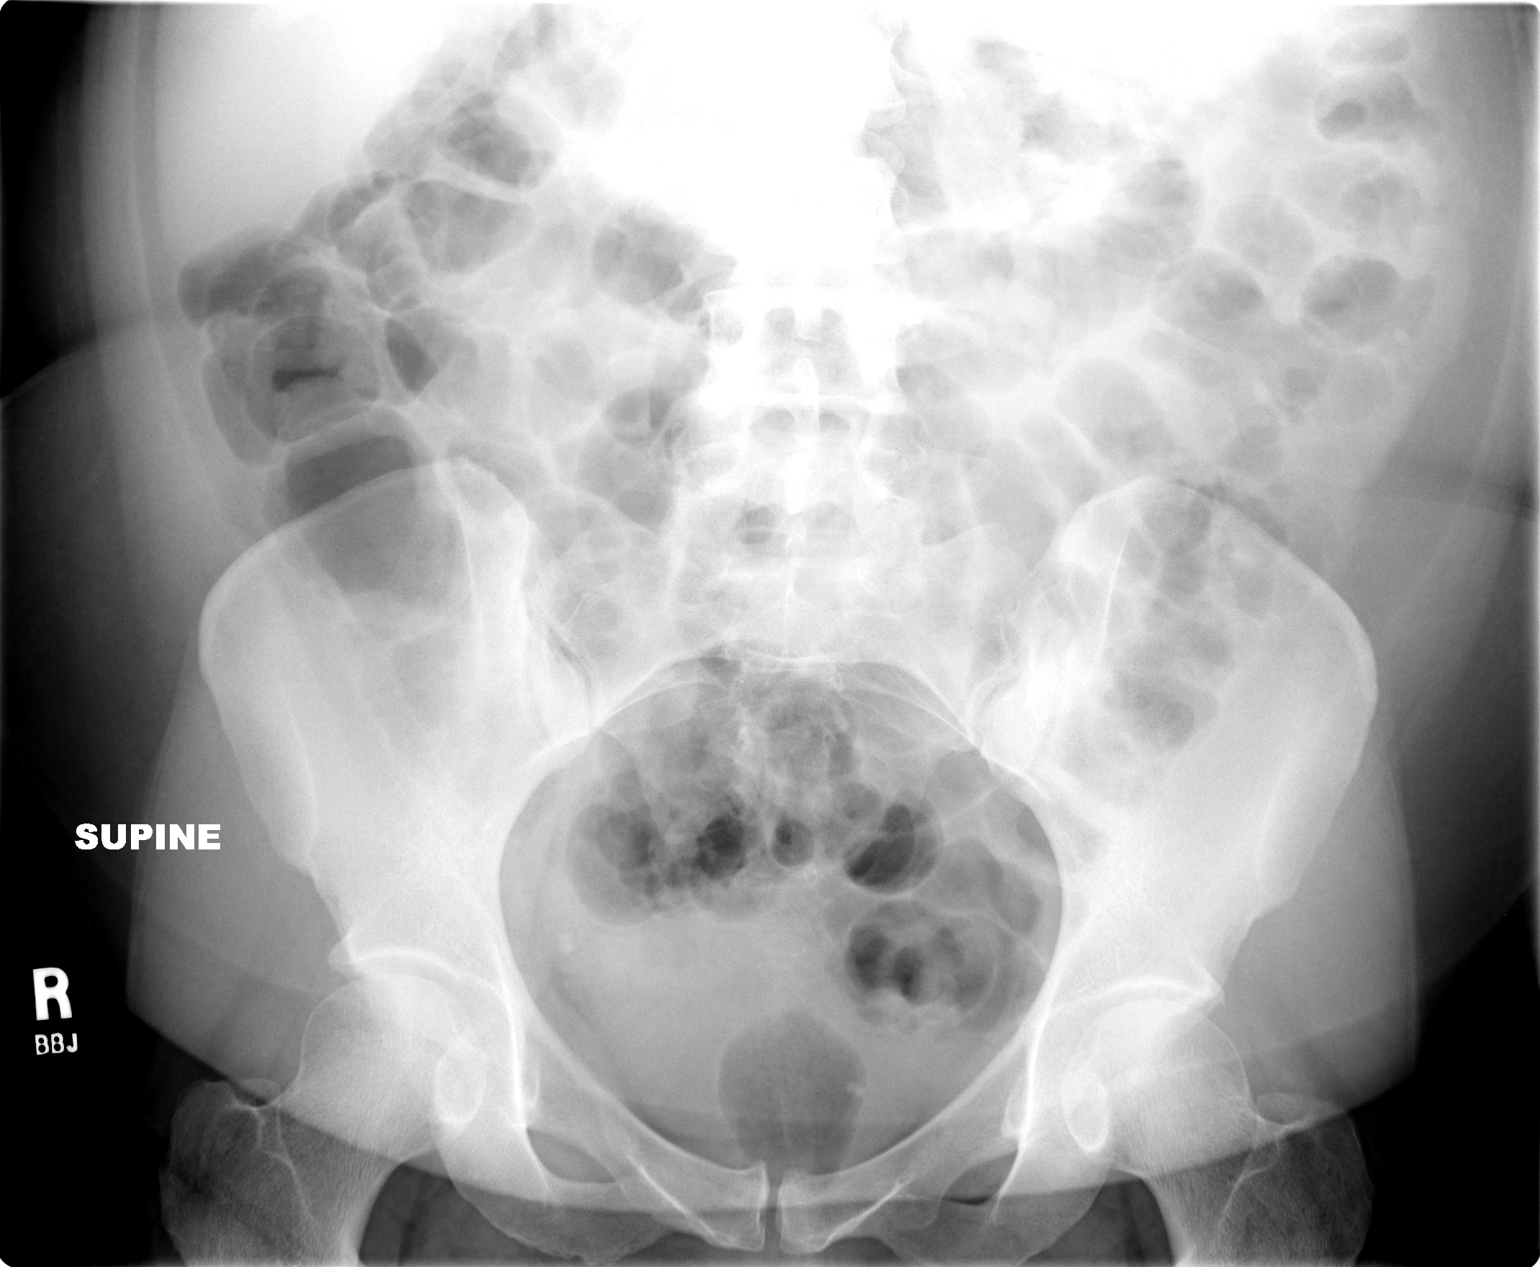

[2 of 2 positions shown; findings below may reference images not displayed]

## 2004-05-01 ENCOUNTER — Emergency Department (HOSPITAL_COMMUNITY): Admission: EM | Admit: 2004-05-01 | Discharge: 2004-05-02 | Payer: Self-pay | Admitting: *Deleted

## 2004-05-07 ENCOUNTER — Emergency Department (HOSPITAL_COMMUNITY): Admission: EM | Admit: 2004-05-07 | Discharge: 2004-05-07 | Payer: Self-pay | Admitting: Emergency Medicine

## 2004-05-08 ENCOUNTER — Emergency Department (HOSPITAL_COMMUNITY): Admission: EM | Admit: 2004-05-08 | Discharge: 2004-05-08 | Payer: Self-pay | Admitting: Emergency Medicine

## 2004-05-10 ENCOUNTER — Emergency Department (HOSPITAL_COMMUNITY): Admission: EM | Admit: 2004-05-10 | Discharge: 2004-05-10 | Payer: Self-pay | Admitting: *Deleted

## 2004-05-13 ENCOUNTER — Emergency Department (HOSPITAL_COMMUNITY): Admission: EM | Admit: 2004-05-13 | Discharge: 2004-05-13 | Payer: Self-pay | Admitting: Emergency Medicine

## 2004-05-18 ENCOUNTER — Emergency Department (HOSPITAL_COMMUNITY): Admission: EM | Admit: 2004-05-18 | Discharge: 2004-05-18 | Payer: Self-pay | Admitting: Emergency Medicine

## 2004-05-20 ENCOUNTER — Emergency Department (HOSPITAL_COMMUNITY): Admission: EM | Admit: 2004-05-20 | Discharge: 2004-05-20 | Payer: Self-pay | Admitting: *Deleted

## 2004-05-21 ENCOUNTER — Emergency Department (HOSPITAL_COMMUNITY): Admission: EM | Admit: 2004-05-21 | Discharge: 2004-05-21 | Payer: Self-pay | Admitting: Emergency Medicine

## 2004-05-21 ENCOUNTER — Emergency Department (HOSPITAL_COMMUNITY): Admission: EM | Admit: 2004-05-21 | Discharge: 2004-05-22 | Payer: Self-pay | Admitting: Emergency Medicine

## 2004-05-27 ENCOUNTER — Emergency Department (HOSPITAL_COMMUNITY): Admission: EM | Admit: 2004-05-27 | Discharge: 2004-05-27 | Payer: Self-pay | Admitting: Emergency Medicine

## 2004-05-28 ENCOUNTER — Emergency Department (HOSPITAL_COMMUNITY): Admission: EM | Admit: 2004-05-28 | Discharge: 2004-05-28 | Payer: Self-pay | Admitting: Emergency Medicine

## 2004-05-28 IMAGING — CT CT HEAD W/O CM
1 series · 16 of 28 positions shown, 20 images · non-contrast
Comparison: none

CLINICAL DATA: Syncope.  Altered level of consciousness. 
 CT HEAD WITHOUT CONTRAST ? [DATE]

[Series 2047: — · axial · 0.43mm/px · z∈[-648,-523]mm · 16 of 28 slices shown, 20 images]
[im 2/28  brain]
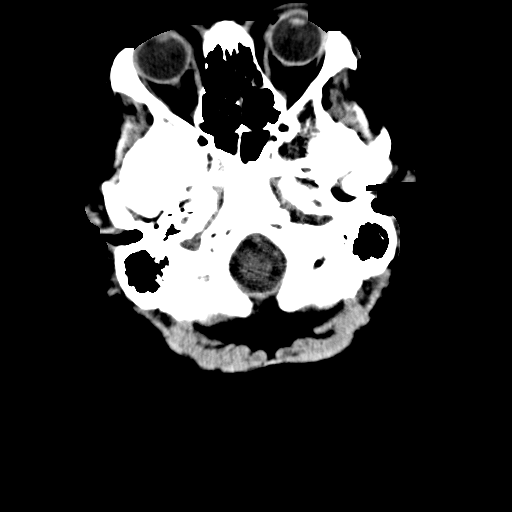
[im 2/28  bone]
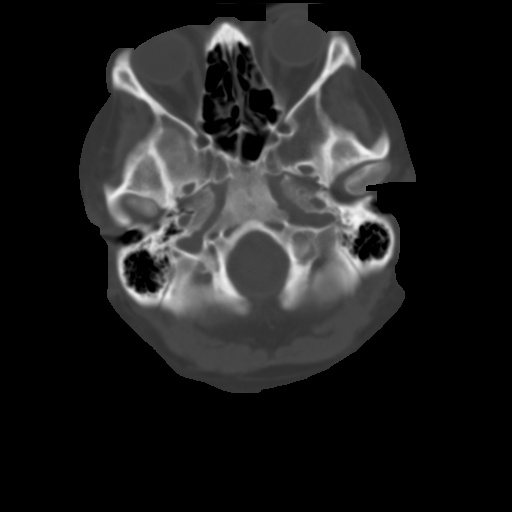
[im 4/28  brain]
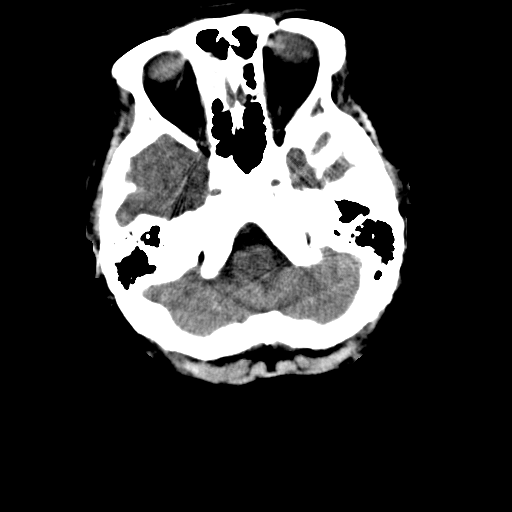
[im 6/28  brain]
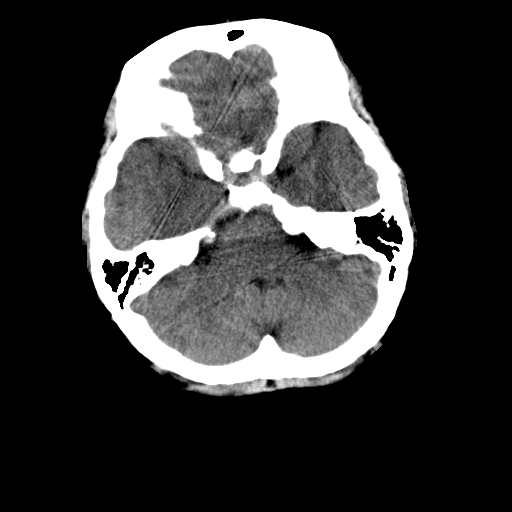
[im 7/28  brain]
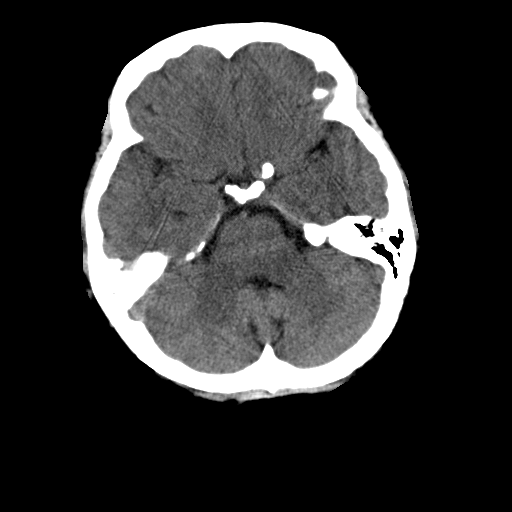
[im 9/28  brain]
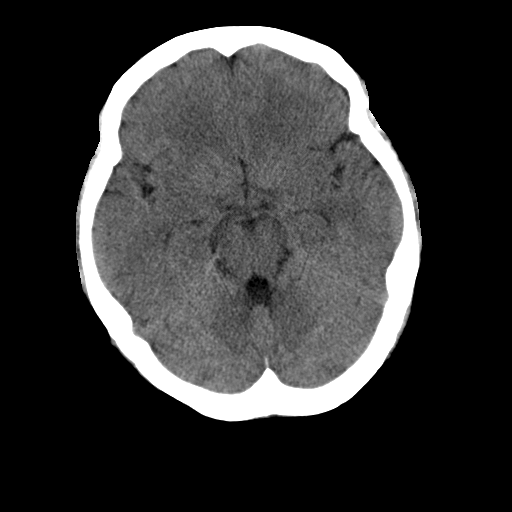
[im 9/28  bone]
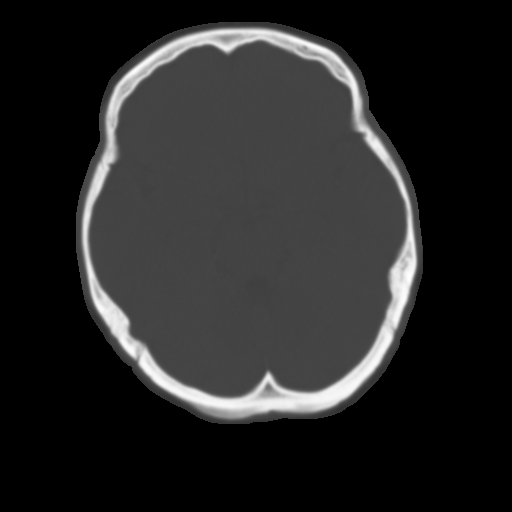
[im 10/28  brain]
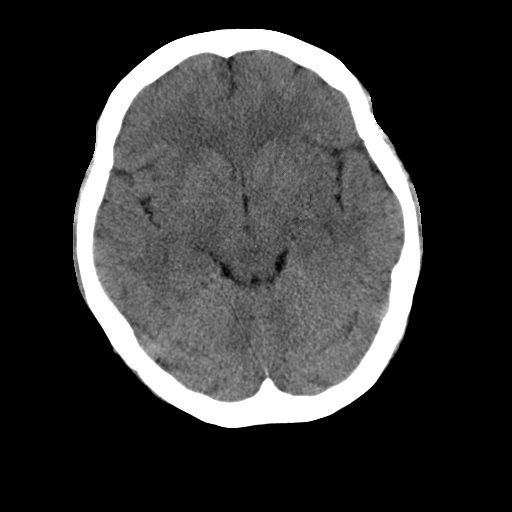
[im 12/28  brain]
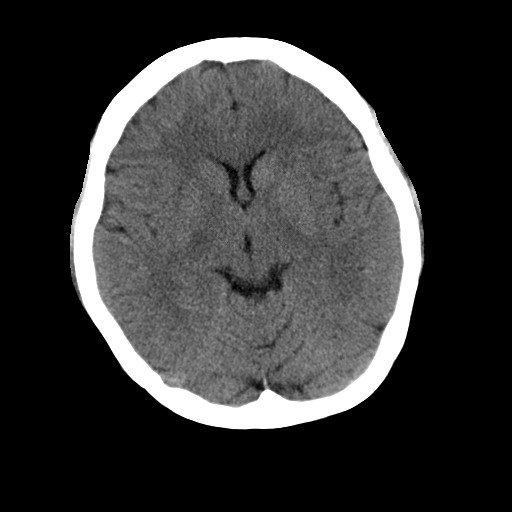
[im 14/28  brain]
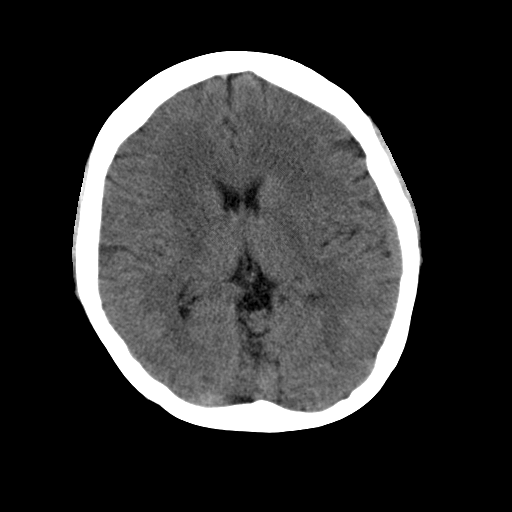
[im 15/28  brain]
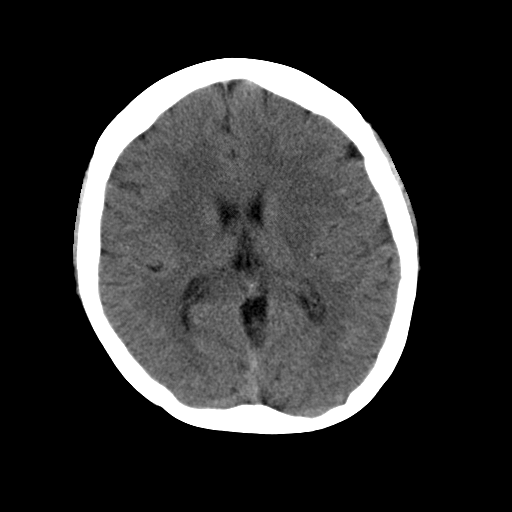
[im 15/28  bone]
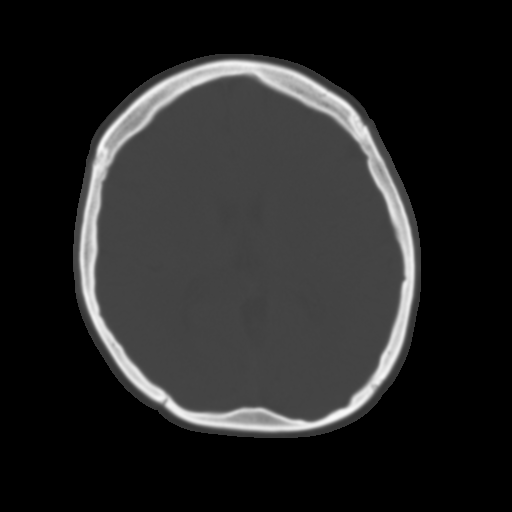
[im 17/28  brain]
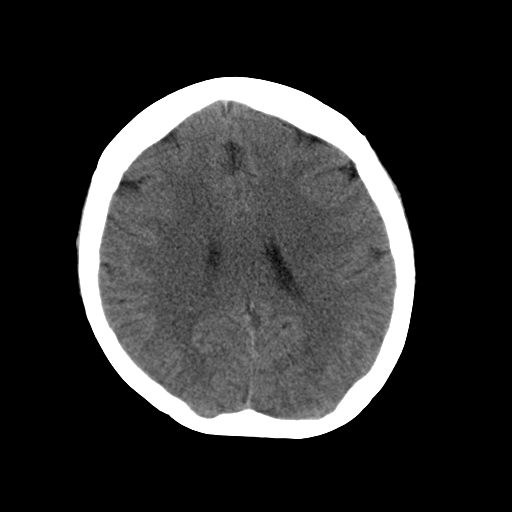
[im 19/28  brain]
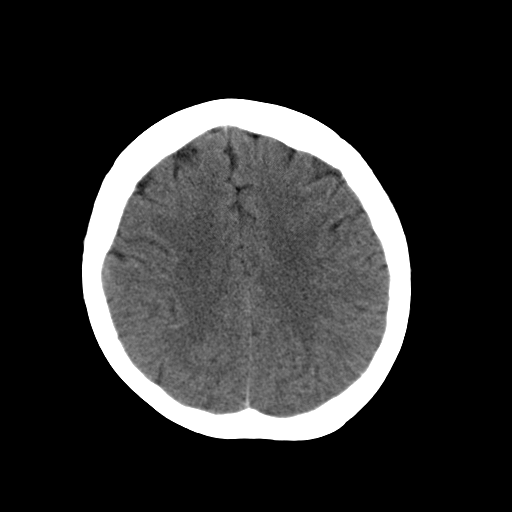
[im 20/28  brain]
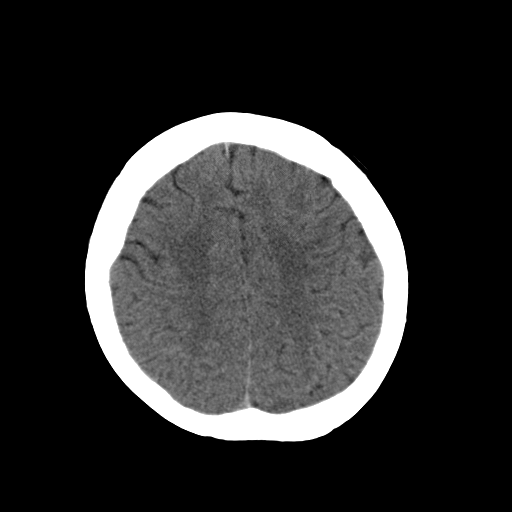
[im 22/28  brain]
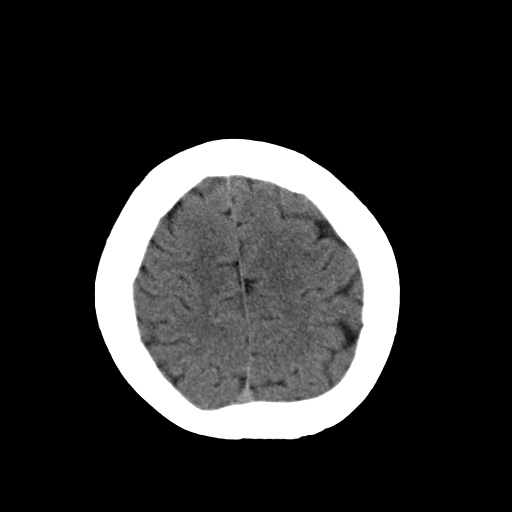
[im 22/28  bone]
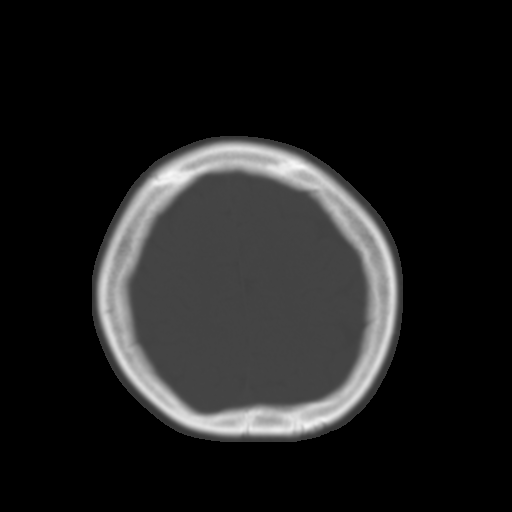
[im 23/28  brain]
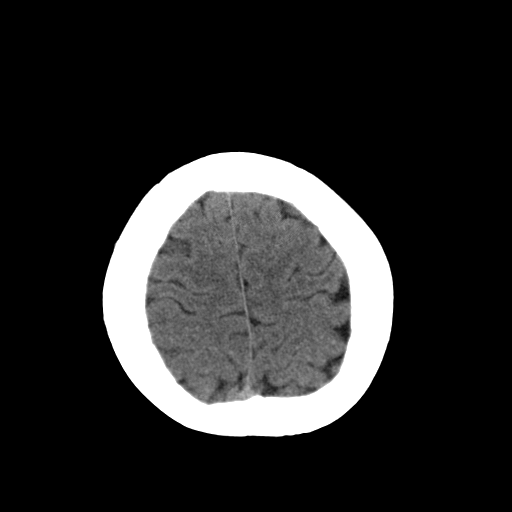
[im 25/28  brain]
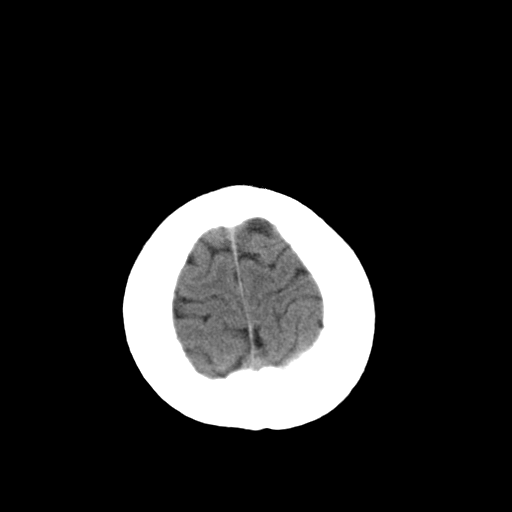
[im 27/28  brain]
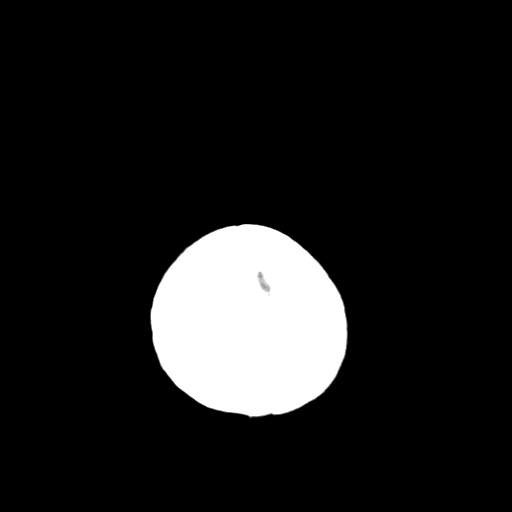

[16 of 28 positions shown; findings below may reference images not displayed]

FINDINGS: No evidence of acute intracranial abnormality including mass or mass effect, hydrocephalus, extra-axial fluid collection, midline shift, hemorrhage, or acute infarct.   Acute infarct may be missed by CT for 24-48 hours.   The visualized bony calvarium is unremarkable.
 IMPRESSION
 No evidence of acute intracranial abnormality.

## 2004-05-31 ENCOUNTER — Emergency Department (HOSPITAL_COMMUNITY): Admission: EM | Admit: 2004-05-31 | Discharge: 2004-05-31 | Payer: Self-pay | Admitting: Emergency Medicine

## 2004-06-18 ENCOUNTER — Emergency Department (HOSPITAL_COMMUNITY): Admission: EM | Admit: 2004-06-18 | Discharge: 2004-06-18 | Payer: Self-pay | Admitting: *Deleted

## 2004-06-29 ENCOUNTER — Emergency Department (HOSPITAL_COMMUNITY): Admission: EM | Admit: 2004-06-29 | Discharge: 2004-06-29 | Payer: Self-pay | Admitting: Emergency Medicine

## 2004-06-30 ENCOUNTER — Emergency Department (HOSPITAL_COMMUNITY): Admission: EM | Admit: 2004-06-30 | Discharge: 2004-07-01 | Payer: Self-pay | Admitting: *Deleted

## 2004-07-01 IMAGING — CT CT ABDOMEN W/ CM
1 of 3 series · 14 of 32 positions shown, 19 images · IV contrast (CONTRAST)
Comparison: none

**DUPLICATE COPY for exam association in [facility] - No change from original report, [DATE]**
CLINICAL DATA: Right-sided abdominal pain. 
 CT ABDOMEN WITH CONTRAST 
 Comparing to conventional radiographs of [DATE].
TECHNIQUE: Contiguous axial CT images were obtained from the lung bases through the iliac crests following the intravenous administration of 100 cc of Omnipaque 300 IV contrast.

[Series 5644: — · axial · 0.71mm/px · z∈[+1194,+1624]mm · 14 of 98 slices shown, 19 images]
[im 6/98  soft-tissue]
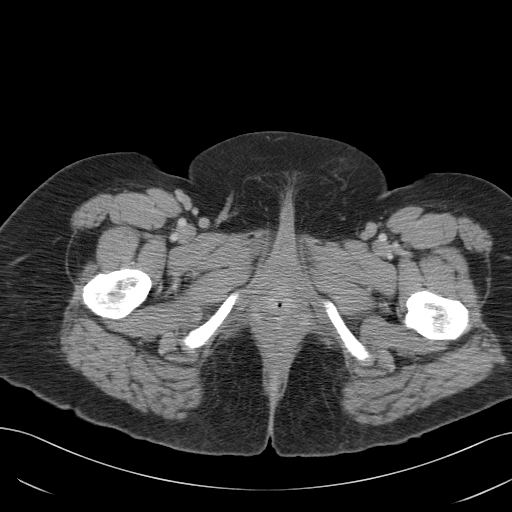
[im 6/98  bone]
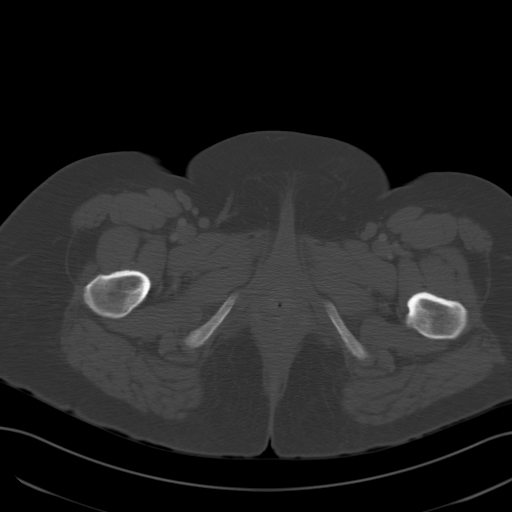
[im 11/98  soft-tissue]
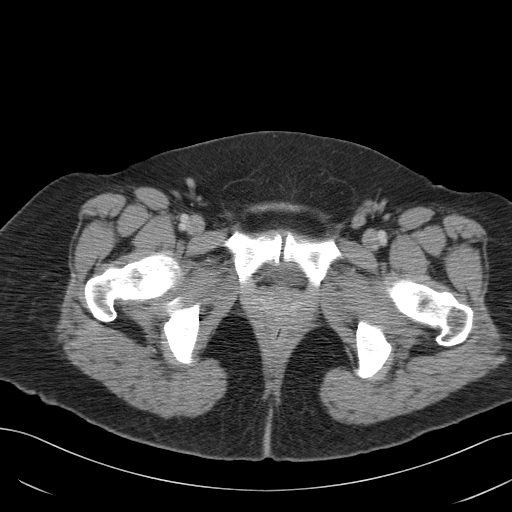
[im 22/98  soft-tissue]
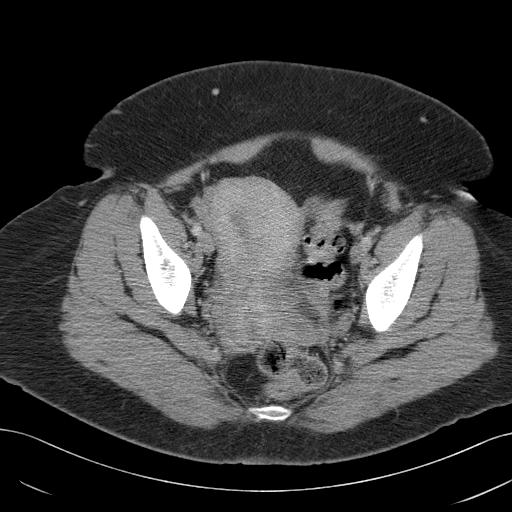
[im 27/98  soft-tissue]
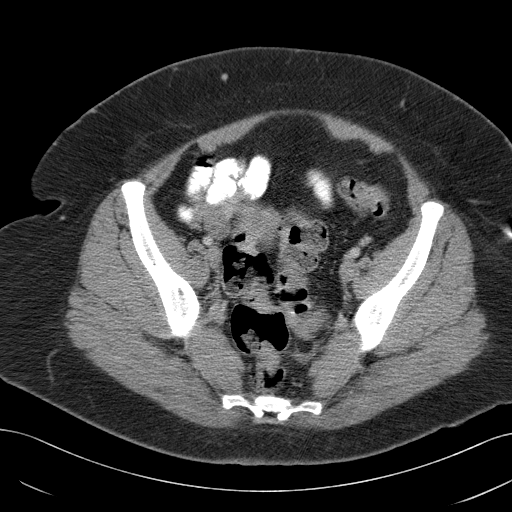
[im 33/98  soft-tissue]
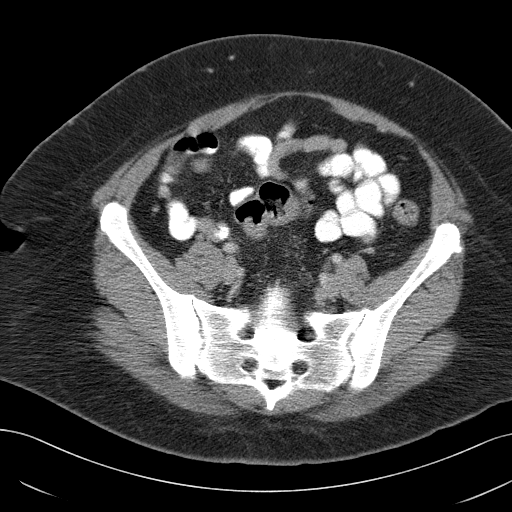
[im 44/98  soft-tissue]
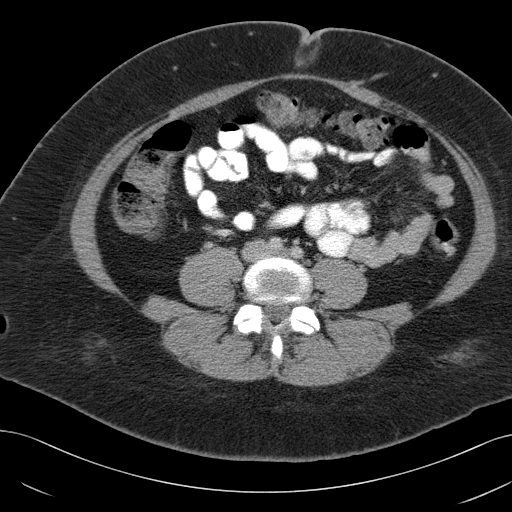
[im 49/98  soft-tissue]
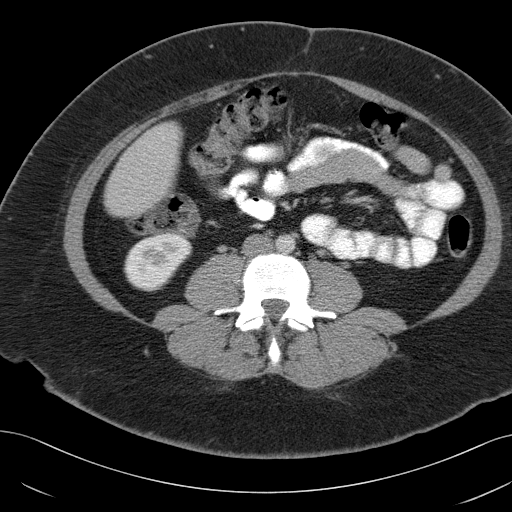
[im 54/98  soft-tissue]
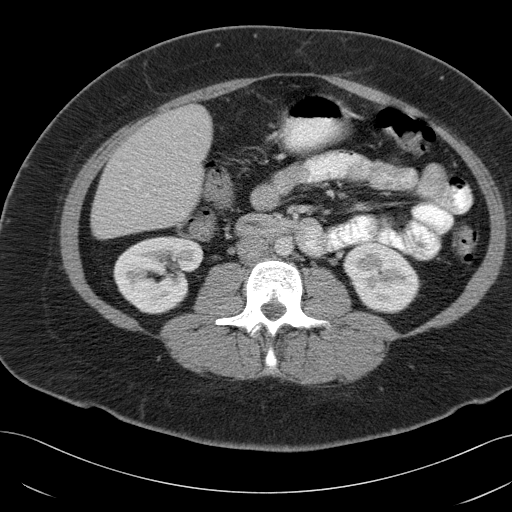
[im 65/98  soft-tissue]
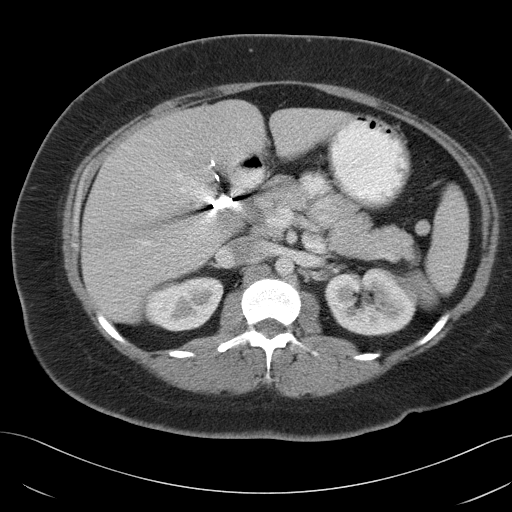
[im 65/98  bone]
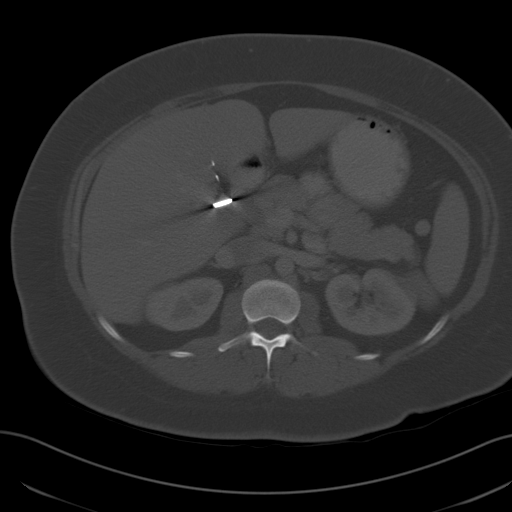
[im 71/98  soft-tissue]
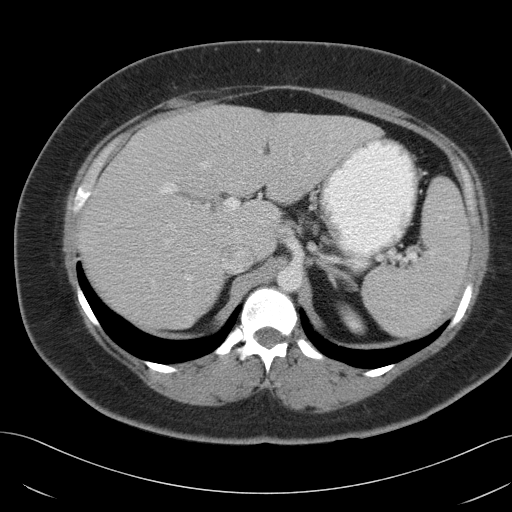
[im 76/98  soft-tissue]
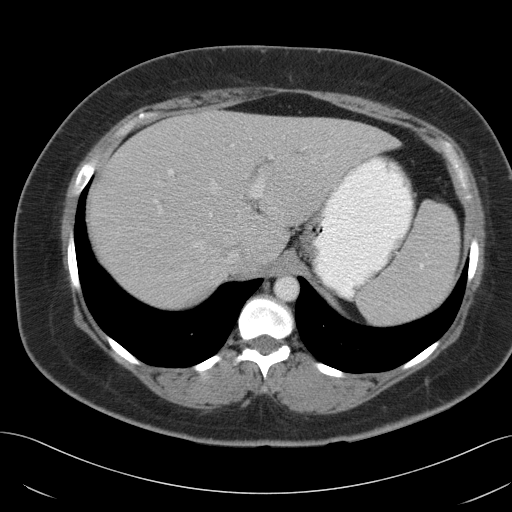
[im 76/98  lung]
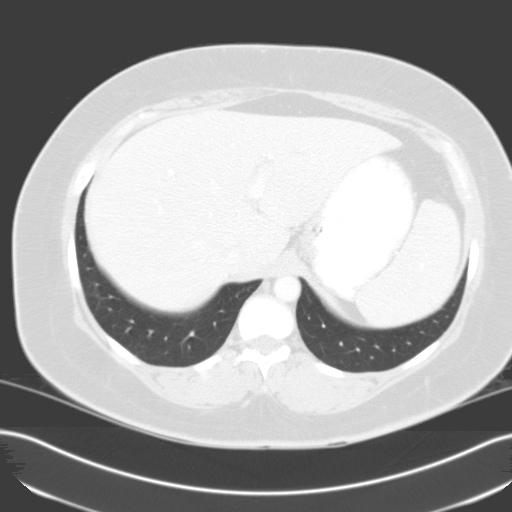
[im 81/98  lung]
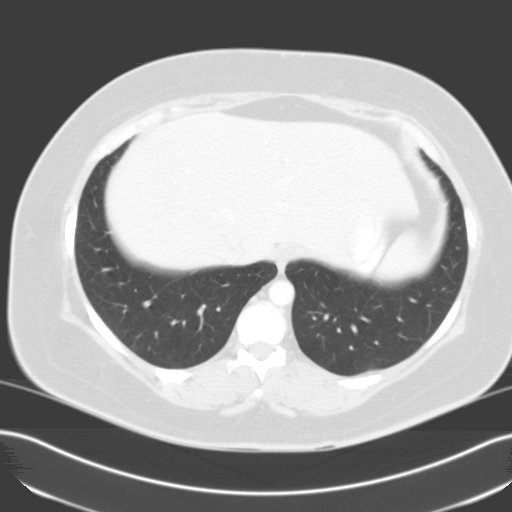
[im 87/98  soft-tissue]
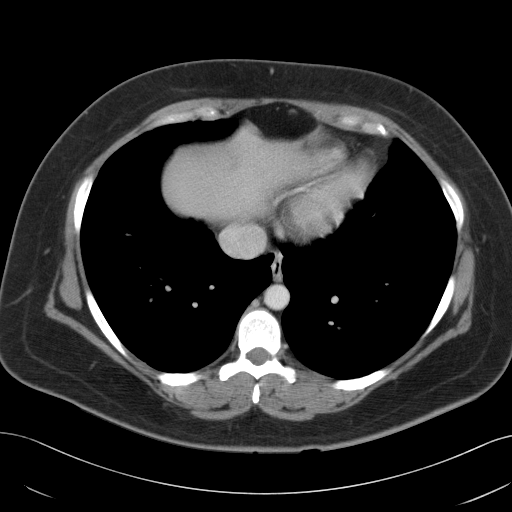
[im 87/98  lung]
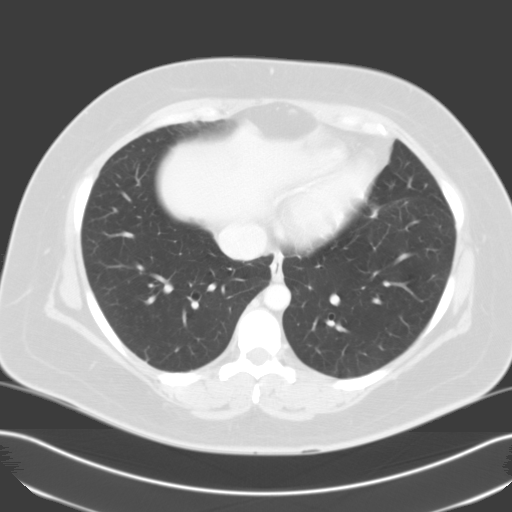
[im 92/98  soft-tissue]
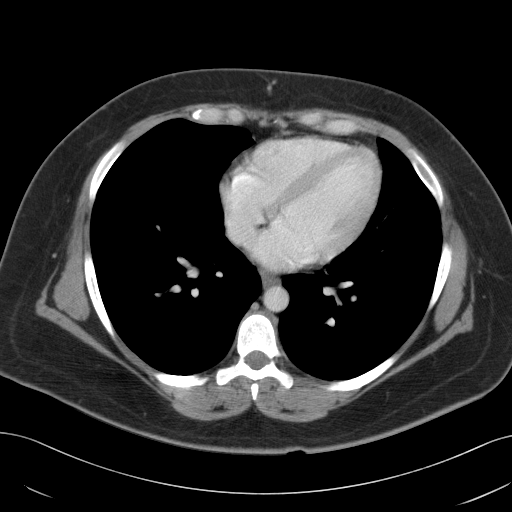
[im 92/98  lung]
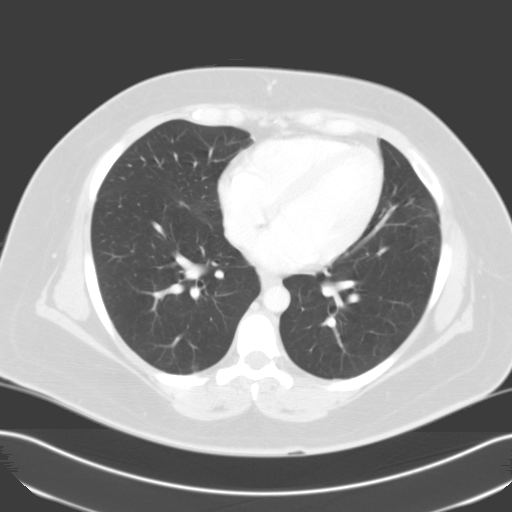

[14 of 32 positions shown; findings below may reference images not displayed]

FINDINGS: Lung bases appear clear.  Patient has had prior cholecystectomy.  A small umbilical hernia is present and contains adipose tissue.  Cannot exclude some inflammation of this umbilical hernia. 
 The kidneys, liver, spleen, spleen, pancreas, and adrenal glands appear unremarkable.  Small retroperitoneal lymph nodes are present and may be reactive, but no pathologic abnormality is noted.  No free pelvic fluid is evident. 
 IMPRESSION
 1.  Small umbilical hernia containing adipose tissue. 
 CT OF THE PELVIS WITH CONTRAST 
 Contiguous axial CT images were obtained from the iliac crests to the proximal femora following oral and IV contrast.
FINDINGS: The appendix appears normal.  The pelvic bowel appears normal.  The uterus and adnexa appear unremarkable in CT appearance. 
 IMPRESSION
 No acute pelvic findings.

## 2004-07-13 ENCOUNTER — Emergency Department (HOSPITAL_COMMUNITY): Admission: EM | Admit: 2004-07-13 | Discharge: 2004-07-14 | Payer: Self-pay | Admitting: *Deleted

## 2004-07-14 IMAGING — CR DG CHEST 2V
2 series · 2 of 2 positions shown · non-contrast
Comparison: none

CLINICAL DATA: Short of breath.  Back pain.  
 CHEST (TWO VIEWS)

  The heart size and mediastinal contours are normal. The lungs are clear. The visualized skeleton is unremarkable.
 IMPRESSION
 No active disease.

[view not recorded (1 of 2)]
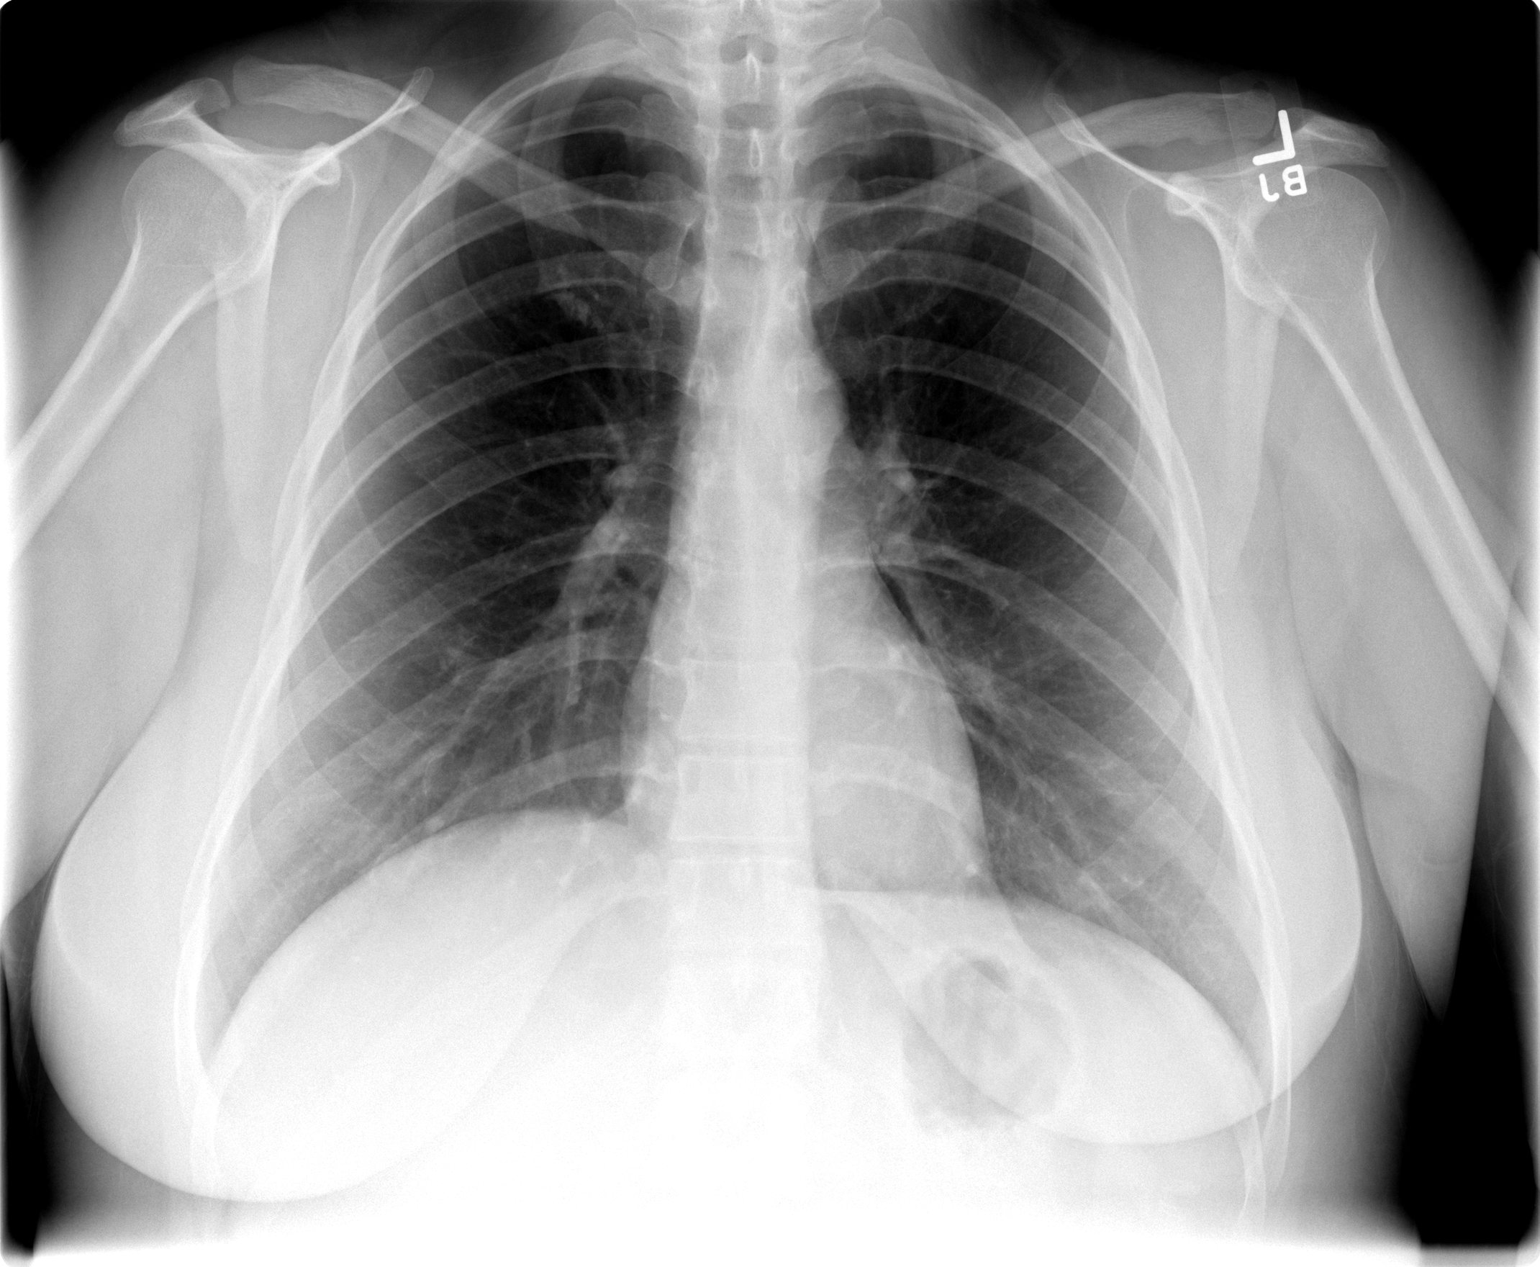

[view not recorded (2 of 2)]
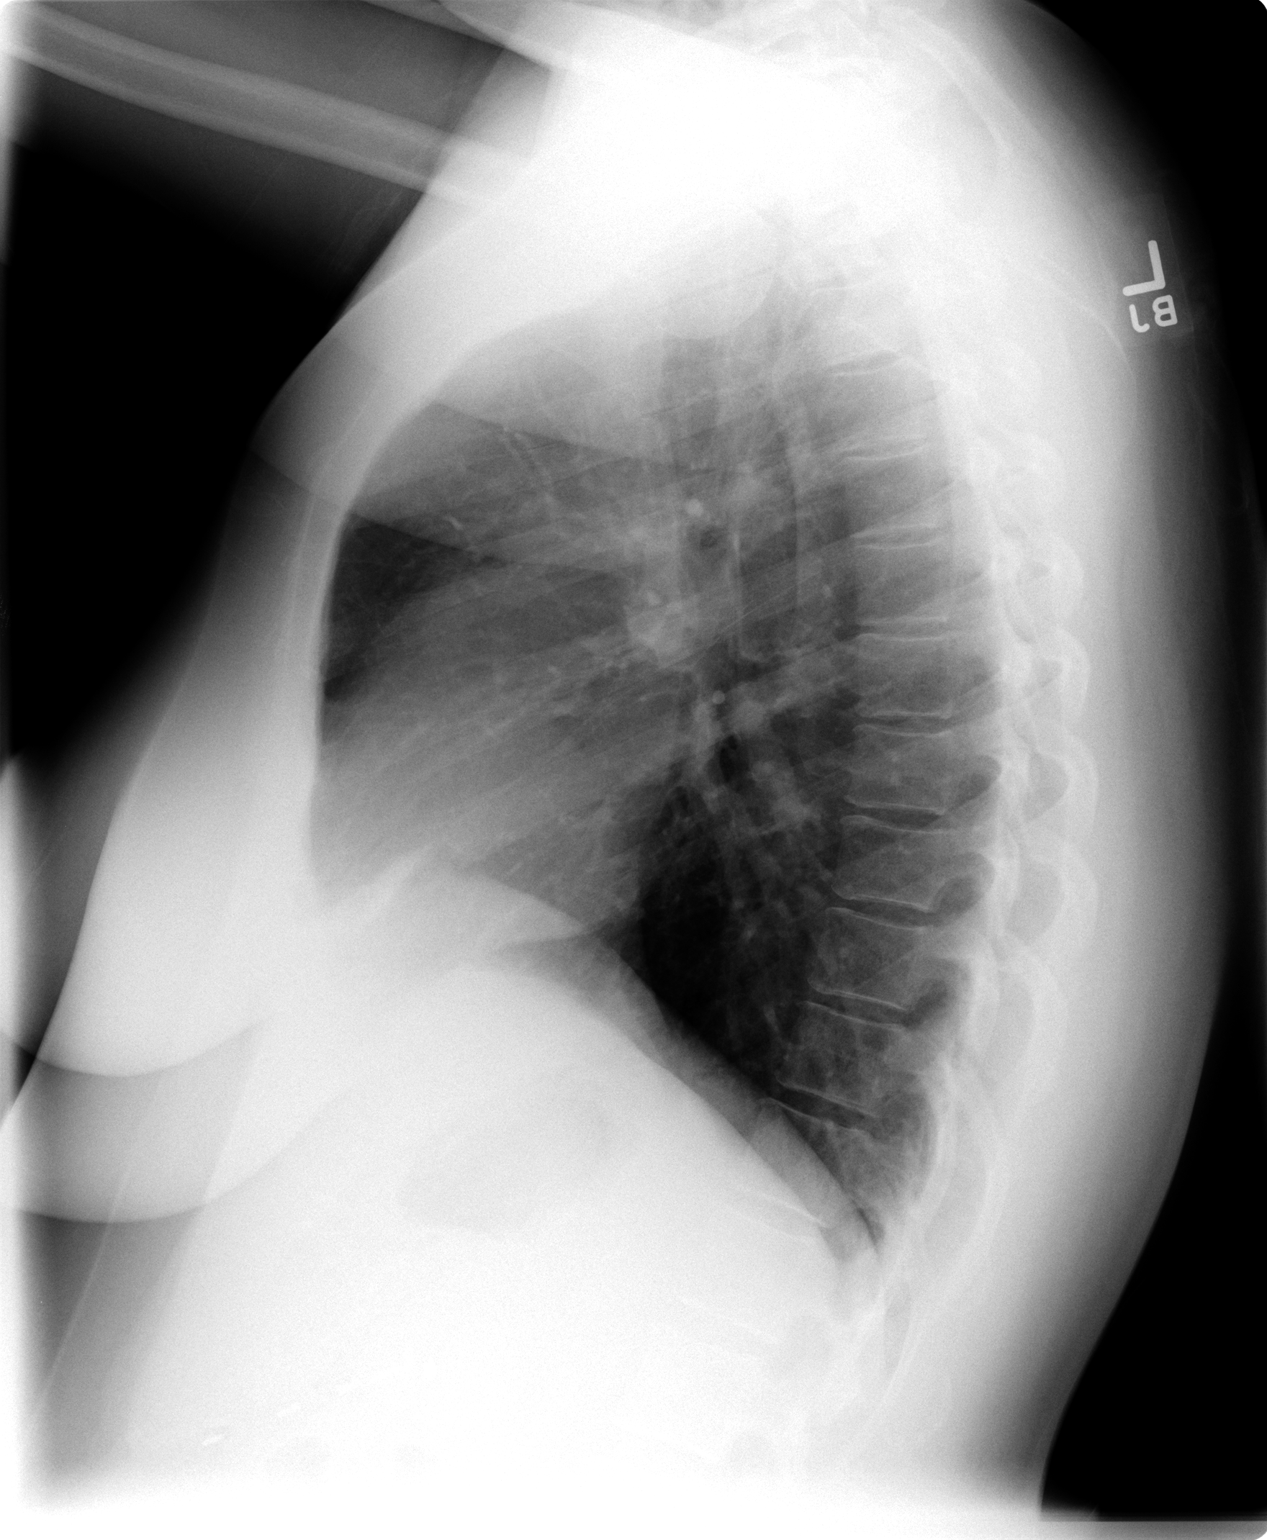

[2 of 2 positions shown; findings below may reference images not displayed]

## 2004-07-17 ENCOUNTER — Emergency Department (HOSPITAL_COMMUNITY): Admission: EM | Admit: 2004-07-17 | Discharge: 2004-07-17 | Payer: Self-pay | Admitting: Emergency Medicine

## 2004-07-21 ENCOUNTER — Emergency Department (HOSPITAL_COMMUNITY): Admission: EM | Admit: 2004-07-21 | Discharge: 2004-07-22 | Payer: Self-pay | Admitting: Emergency Medicine

## 2004-07-31 ENCOUNTER — Emergency Department (HOSPITAL_COMMUNITY): Admission: EM | Admit: 2004-07-31 | Discharge: 2004-08-01 | Payer: Self-pay | Admitting: Emergency Medicine

## 2004-08-12 ENCOUNTER — Emergency Department (HOSPITAL_COMMUNITY): Admission: EM | Admit: 2004-08-12 | Discharge: 2004-08-12 | Payer: Self-pay | Admitting: *Deleted

## 2004-08-12 IMAGING — CR DG CHEST 2V
2 series · 2 of 2 positions shown · non-contrast
Comparison: [DATE].

CLINICAL DATA: Chest and abdominal pain. Anorexia.

CHEST - 2 VIEW

[view not recorded (1 of 2)]
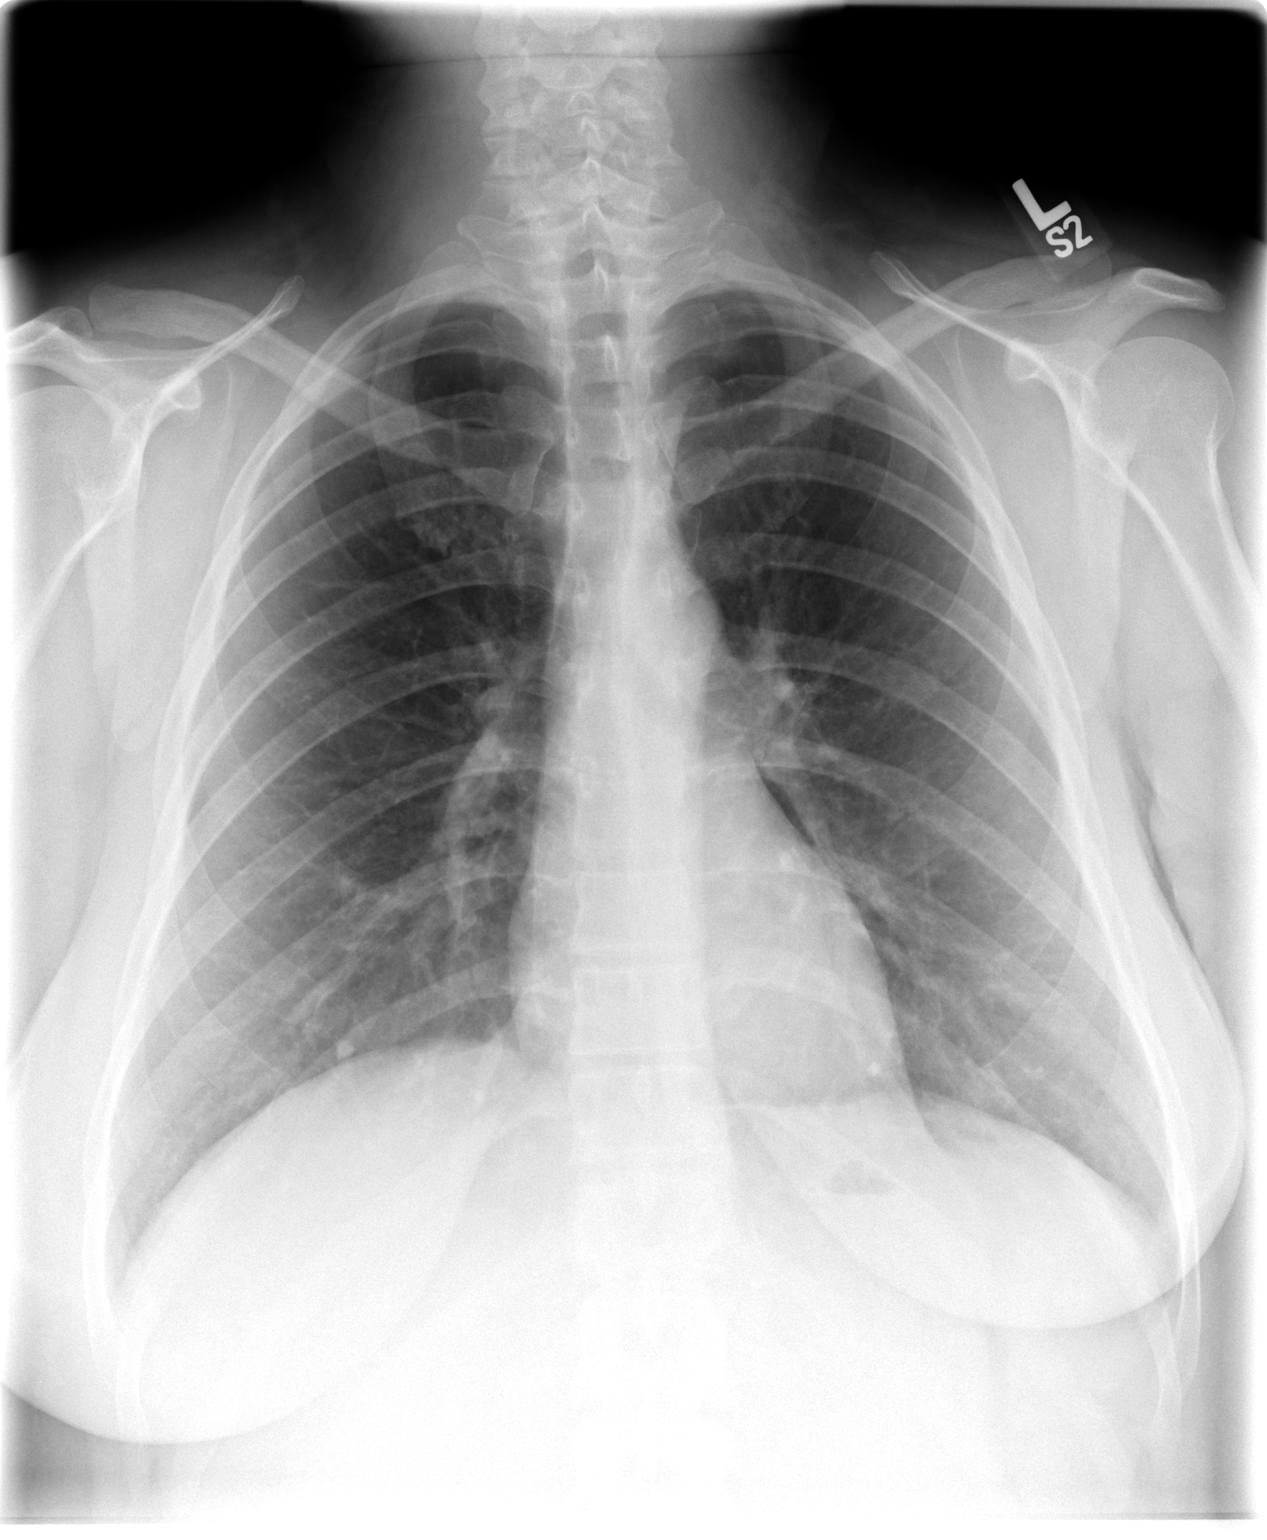

[view not recorded (2 of 2)]
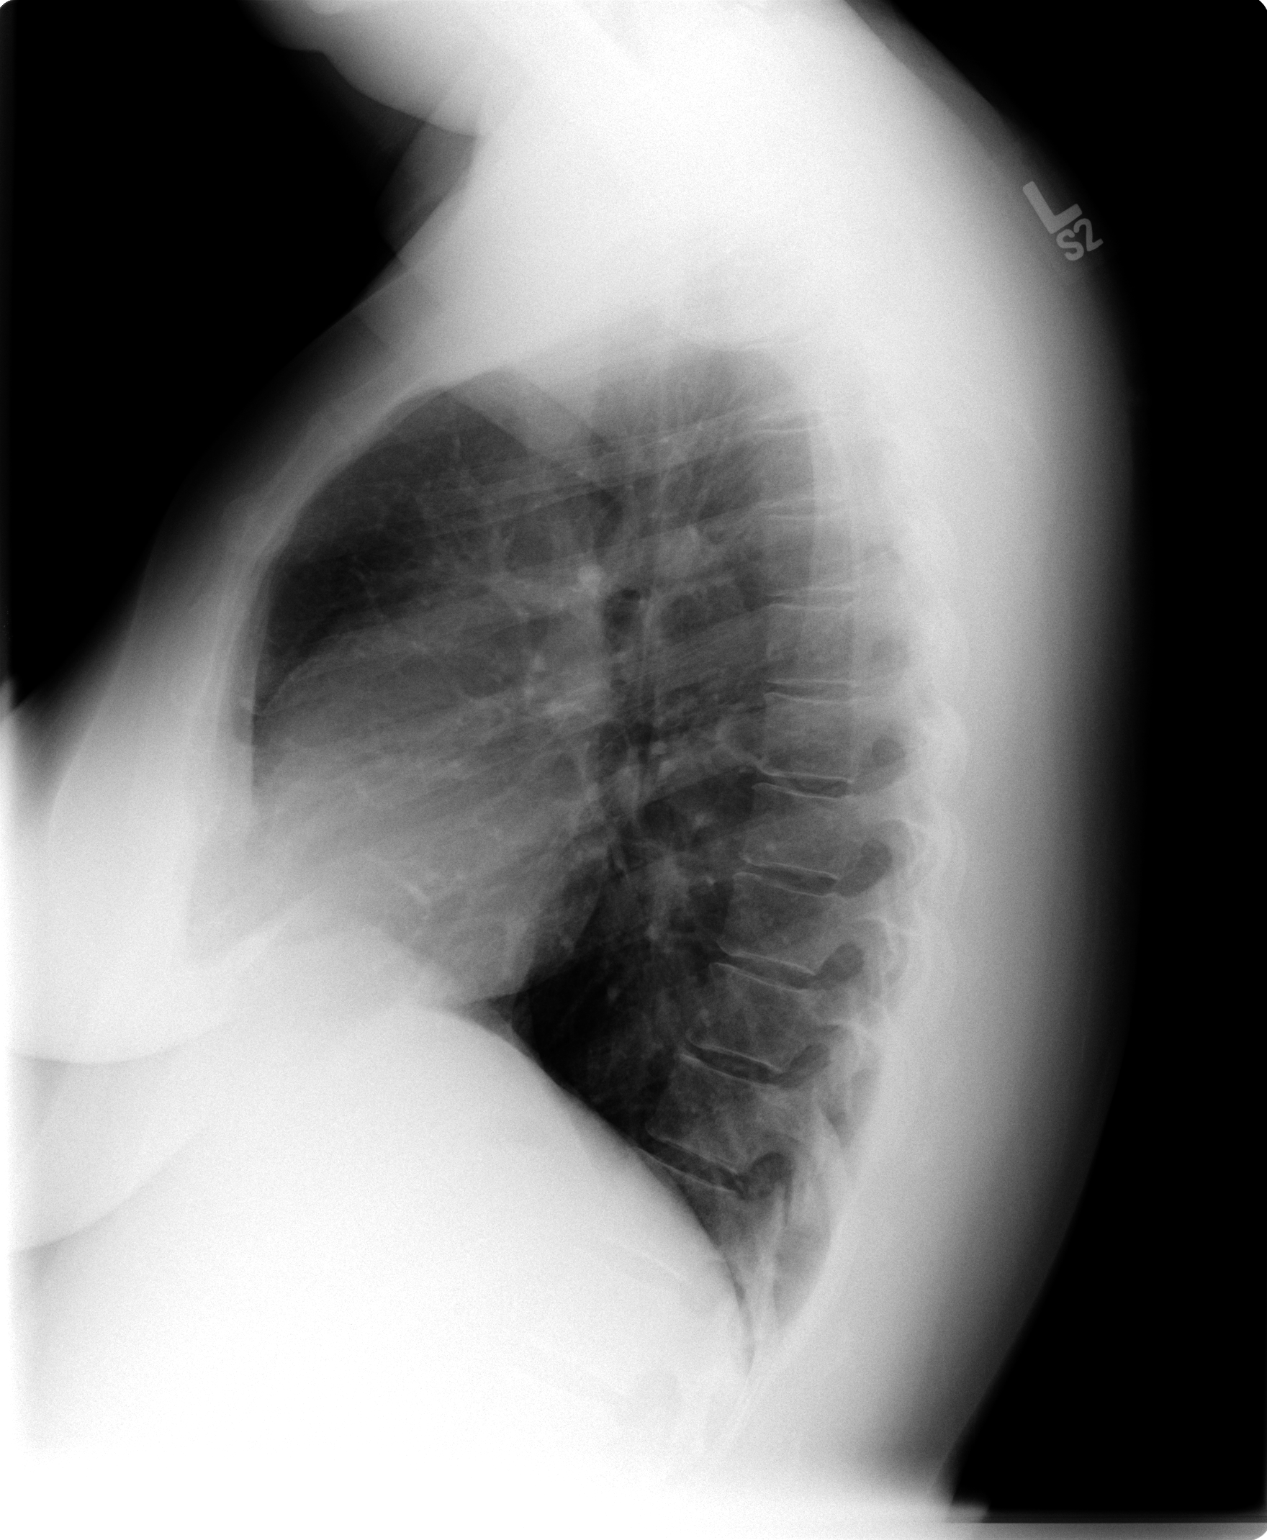

[2 of 2 positions shown; findings below may reference images not displayed]

FINDINGS: The heart size and mediastinal contours are within normal limits.  Both lungs are clear.
The visualized skeletal structures are unremarkable.

IMPRESSION

No active cardiopulmonary disease.

## 2004-08-23 ENCOUNTER — Emergency Department (HOSPITAL_COMMUNITY): Admission: EM | Admit: 2004-08-23 | Discharge: 2004-08-23 | Payer: Self-pay | Admitting: Emergency Medicine

## 2004-08-23 IMAGING — CR DG FOOT COMPLETE 3+V*L*
3 series · 3 of 3 positions shown · non-contrast
Comparison: none

CLINICAL DATA: Left foot pain following a puncture wound stepping on a metallic object.

LEFT FOOT:
Three views of the left foot demonstrate a curvilinear metallic foreign body in the plantar soft tissues of the distal foot. This does not extend to the underlying bone with no underlying fracture or dislocation seen. Moderate inferior calcaneal spur formation is noted.

[view not recorded (1 of 3)]
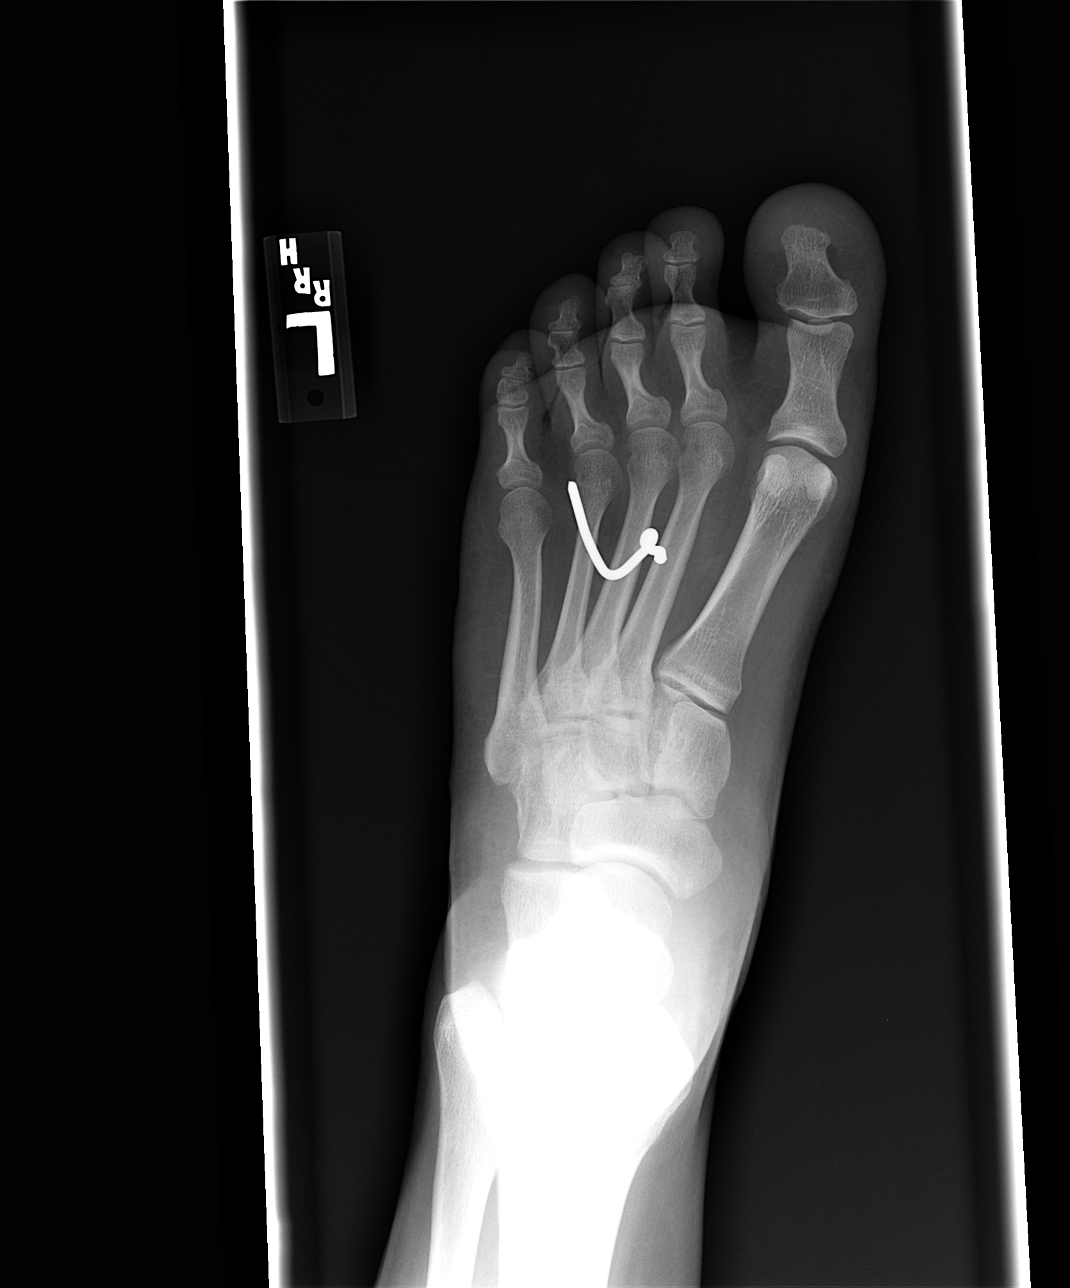

[view not recorded (2 of 3)]
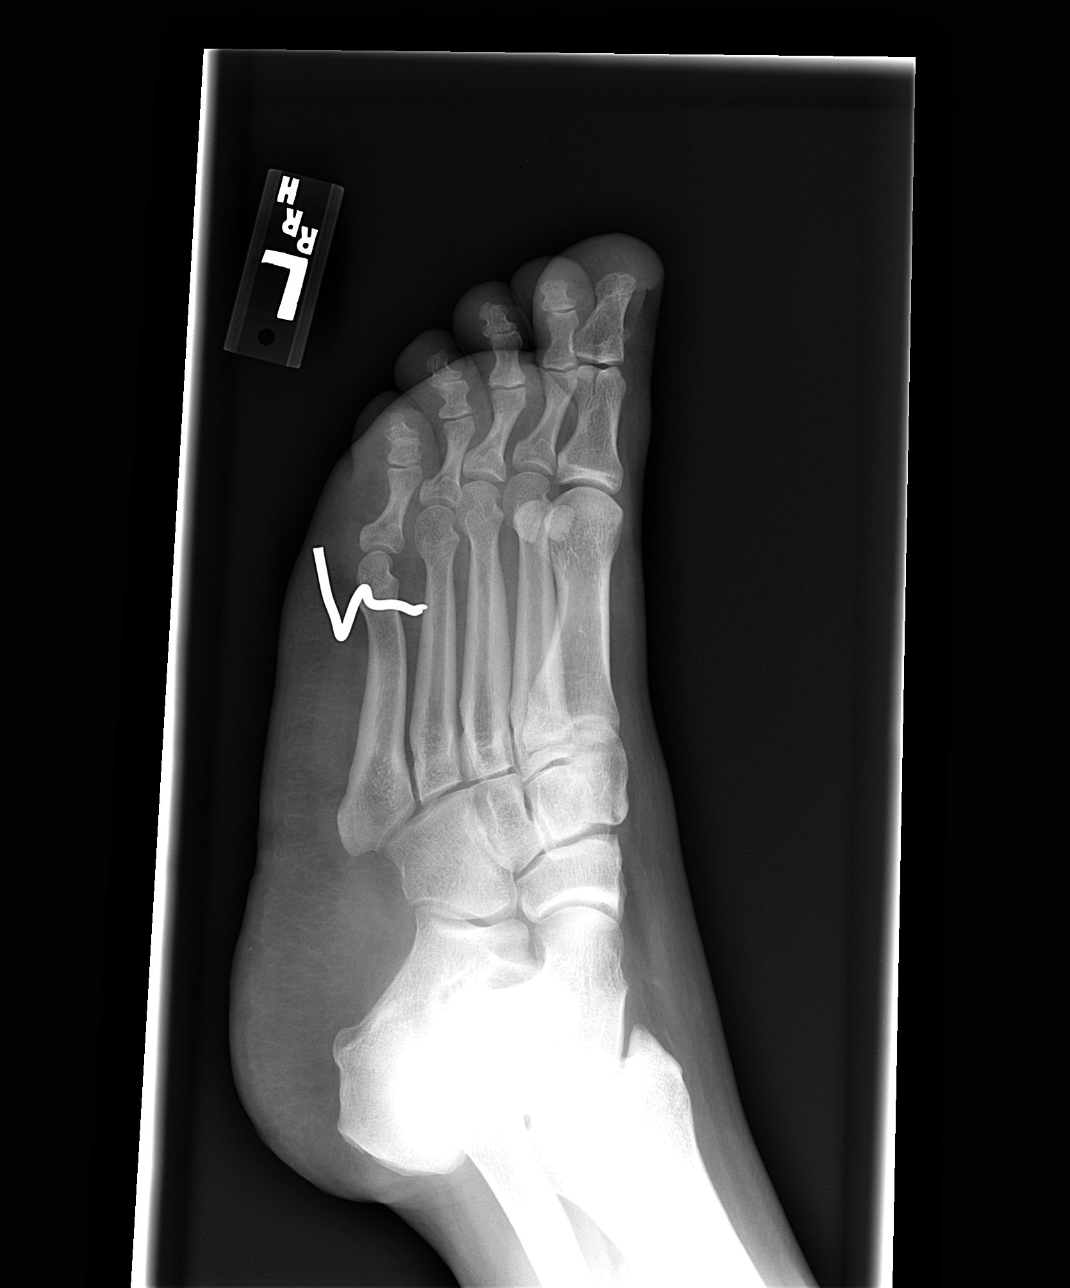

[view not recorded (3 of 3)]
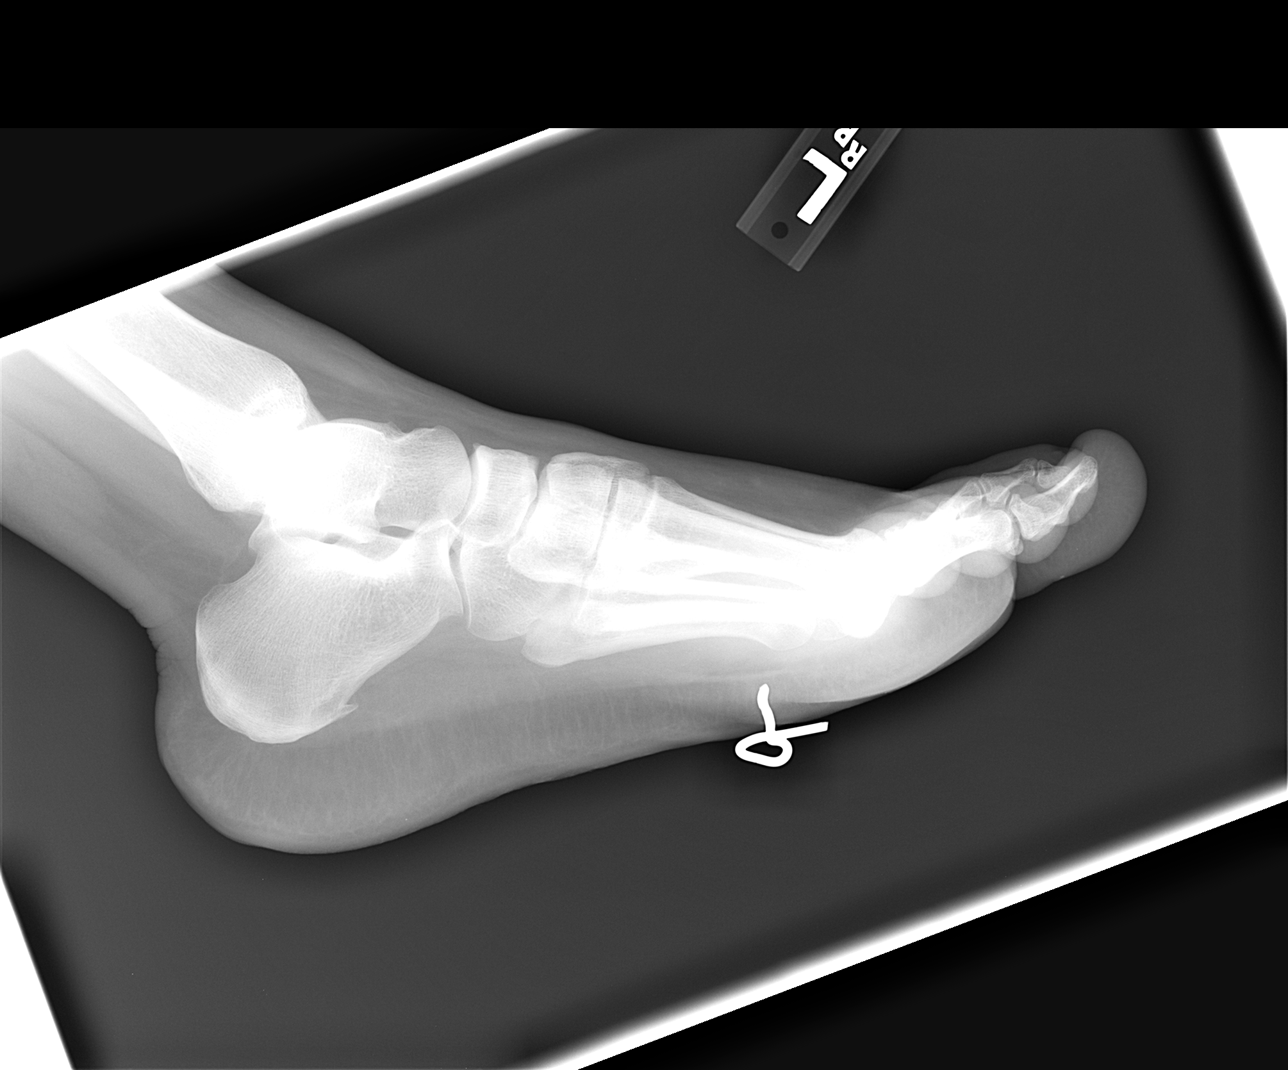

[3 of 3 positions shown; findings below may reference images not displayed]

IMPRESSION: 1. Metallic foreign body in the plantar soft tissues of the distal foot.

2. Moderate inferior calcaneal spur.

## 2004-09-21 ENCOUNTER — Emergency Department (HOSPITAL_COMMUNITY): Admission: EM | Admit: 2004-09-21 | Discharge: 2004-09-21 | Payer: Self-pay | Admitting: *Deleted
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
## 2004-10-12 ENCOUNTER — Emergency Department (HOSPITAL_COMMUNITY): Admission: EM | Admit: 2004-10-12 | Discharge: 2004-10-12 | Payer: Self-pay | Admitting: Emergency Medicine

## 2004-10-14 ENCOUNTER — Emergency Department (HOSPITAL_COMMUNITY): Admission: EM | Admit: 2004-10-14 | Discharge: 2004-10-14 | Payer: Self-pay | Admitting: Emergency Medicine

## 2004-10-18 IMAGING — CR DG CHEST 2V
2 series · 2 of 2 positions shown · non-contrast
Comparison: [DATE].

CLINICAL DATA: 33-year-old female, with shortness of breath and cough. 
 TWO VIEW CHEST RADIOGRAPH

[view not recorded (1 of 2)]
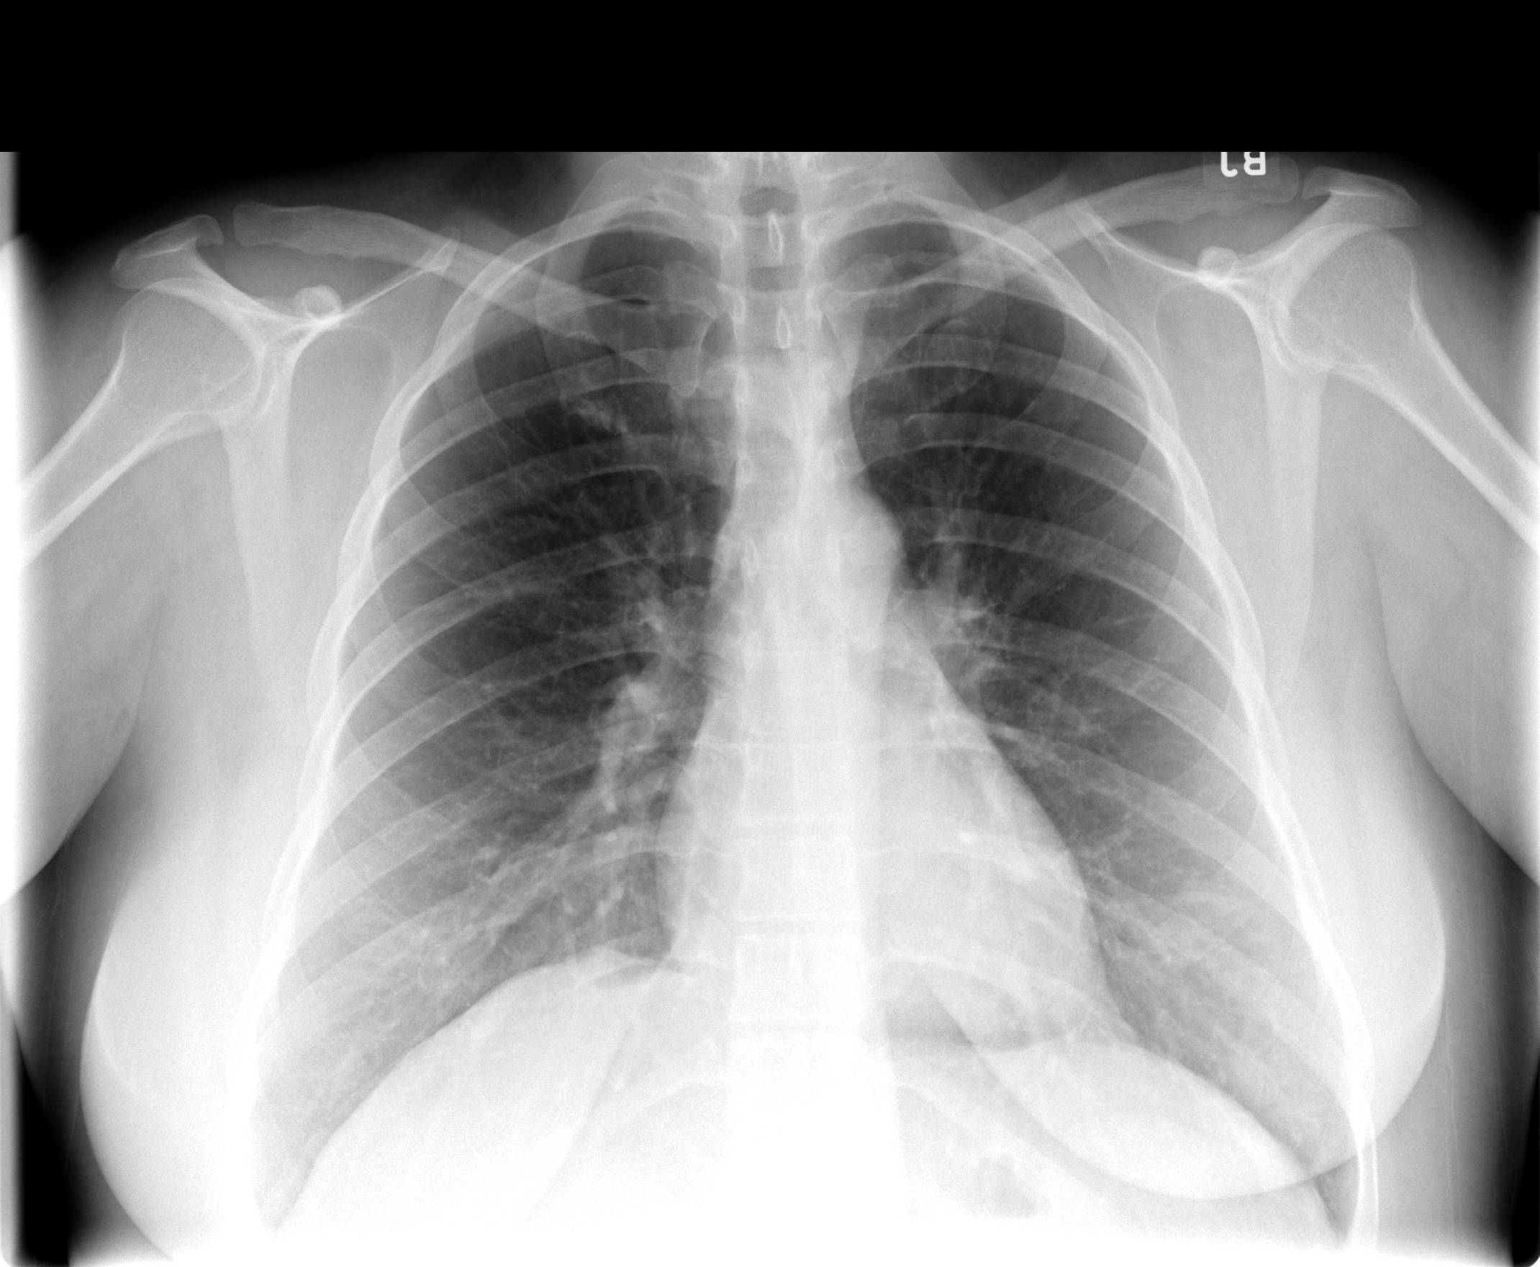

[view not recorded (2 of 2)]
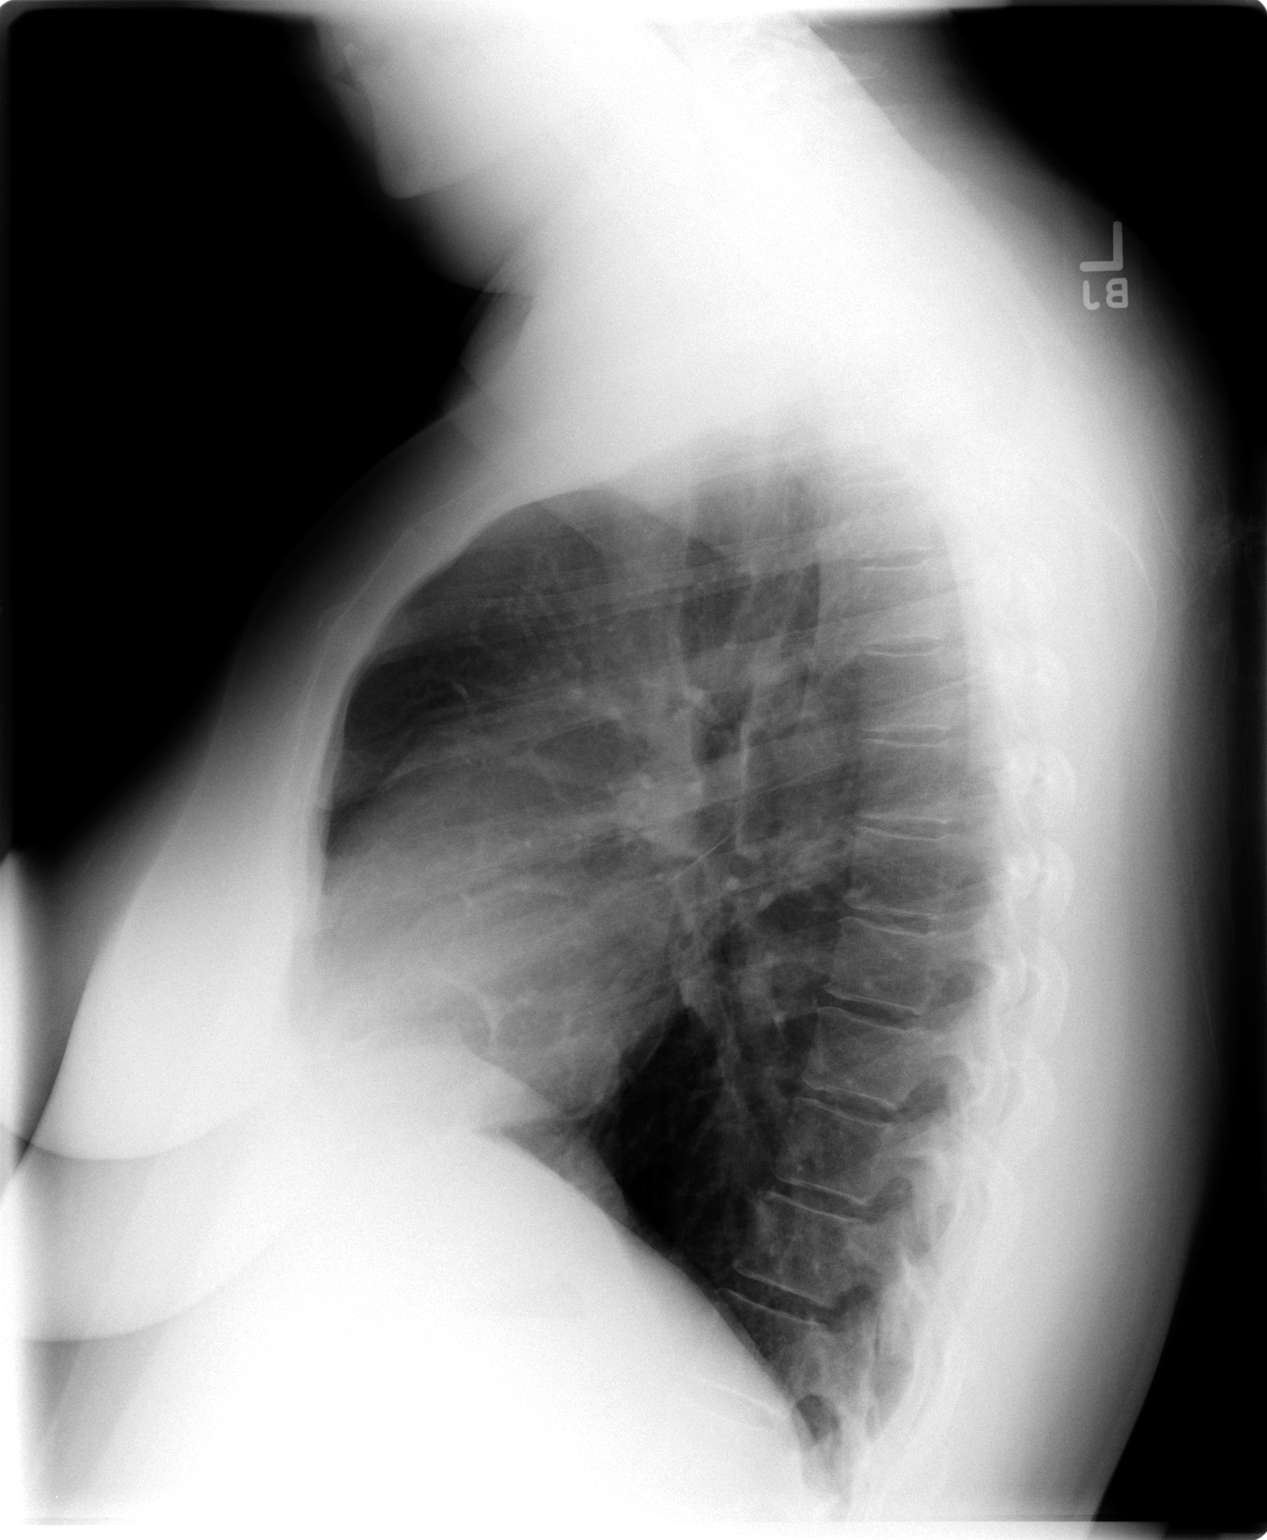

[2 of 2 positions shown; findings below may reference images not displayed]

FINDINGS: Minimal left lower lobe atelectasis.  No acute pneumonia, edema, effusion, or pneumothorax.  Heart size is normal.
 IMPRESSION
 Minimal left lower lobe atelectasis.  No acute chest disease.

## 2004-11-10 ENCOUNTER — Emergency Department (HOSPITAL_COMMUNITY): Admission: EM | Admit: 2004-11-10 | Discharge: 2004-11-10 | Payer: Self-pay | Admitting: Emergency Medicine

## 2004-11-10 IMAGING — CR DG ABDOMEN 1V
1 series · 1 of 1 positions shown · non-contrast
Comparison: CT scan of [DATE].  
 Bowel gas pattern is unremarkable. 
 The psoas shadows appear symmetric.

CLINICAL DATA: Right lower quadrant pain and back pain.
 FRONTAL VIEW ABDOMEN:

[view not recorded]
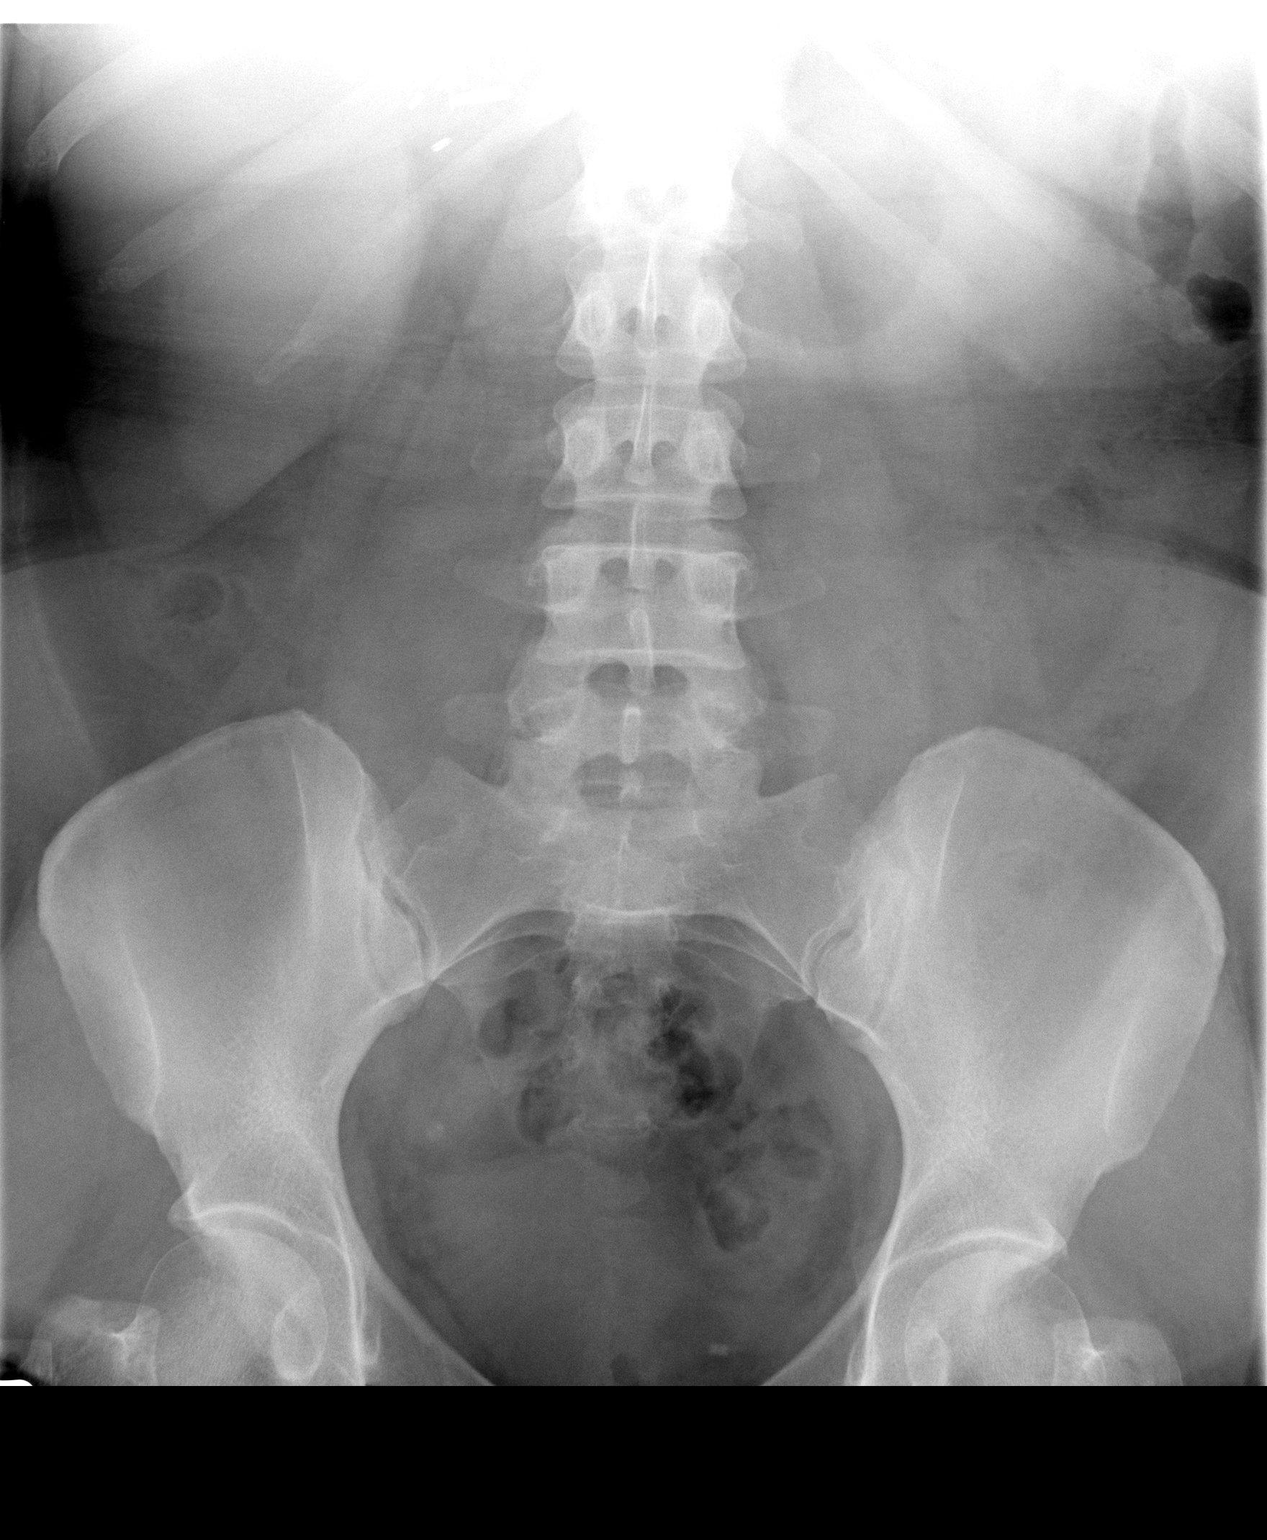

[1 of 1 positions shown; findings below may reference images not displayed]

IMPRESSION: Unremarkable conventional radiographic appearance of the abdomen.

## 2004-11-25 ENCOUNTER — Emergency Department (HOSPITAL_COMMUNITY): Admission: EM | Admit: 2004-11-25 | Discharge: 2004-11-25 | Payer: Self-pay | Admitting: *Deleted

## 2004-12-15 ENCOUNTER — Emergency Department (HOSPITAL_COMMUNITY): Admission: EM | Admit: 2004-12-15 | Discharge: 2004-12-15 | Payer: Self-pay | Admitting: *Deleted

## 2004-12-28 ENCOUNTER — Emergency Department (HOSPITAL_COMMUNITY): Admission: EM | Admit: 2004-12-28 | Discharge: 2004-12-28 | Payer: Self-pay | Admitting: *Deleted

## 2005-01-28 ENCOUNTER — Emergency Department (HOSPITAL_COMMUNITY): Admission: EM | Admit: 2005-01-28 | Discharge: 2005-01-29 | Payer: Self-pay | Admitting: Emergency Medicine

## 2005-02-08 ENCOUNTER — Emergency Department (HOSPITAL_COMMUNITY): Admission: EM | Admit: 2005-02-08 | Discharge: 2005-02-09 | Payer: Self-pay | Admitting: *Deleted

## 2005-02-25 ENCOUNTER — Emergency Department (HOSPITAL_COMMUNITY): Admission: EM | Admit: 2005-02-25 | Discharge: 2005-02-25 | Payer: Self-pay | Admitting: Emergency Medicine
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
## 2005-02-27 ENCOUNTER — Emergency Department (HOSPITAL_COMMUNITY): Admission: EM | Admit: 2005-02-27 | Discharge: 2005-02-27 | Payer: Self-pay | Admitting: Emergency Medicine

## 2005-03-07 ENCOUNTER — Emergency Department (HOSPITAL_COMMUNITY): Admission: EM | Admit: 2005-03-07 | Discharge: 2005-03-07 | Payer: Self-pay | Admitting: *Deleted

## 2005-07-20 ENCOUNTER — Emergency Department (HOSPITAL_COMMUNITY): Admission: EM | Admit: 2005-07-20 | Discharge: 2005-07-20 | Payer: Self-pay | Admitting: Emergency Medicine

## 2005-07-26 ENCOUNTER — Emergency Department (HOSPITAL_COMMUNITY): Admission: EM | Admit: 2005-07-26 | Discharge: 2005-07-27 | Payer: Self-pay | Admitting: Emergency Medicine

## 2005-08-14 ENCOUNTER — Emergency Department (HOSPITAL_COMMUNITY): Admission: EM | Admit: 2005-08-14 | Discharge: 2005-08-15 | Payer: Self-pay | Admitting: *Deleted

## 2005-08-26 ENCOUNTER — Emergency Department (HOSPITAL_COMMUNITY): Admission: EM | Admit: 2005-08-26 | Discharge: 2005-08-27 | Payer: Self-pay | Admitting: Emergency Medicine

## 2005-09-10 ENCOUNTER — Emergency Department (HOSPITAL_COMMUNITY): Admission: EM | Admit: 2005-09-10 | Discharge: 2005-09-10 | Payer: Self-pay | Admitting: Emergency Medicine

## 2005-10-04 ENCOUNTER — Emergency Department (HOSPITAL_COMMUNITY): Admission: EM | Admit: 2005-10-04 | Discharge: 2005-10-04 | Payer: Self-pay | Admitting: Emergency Medicine

## 2005-10-08 ENCOUNTER — Emergency Department (HOSPITAL_COMMUNITY): Admission: EM | Admit: 2005-10-08 | Discharge: 2005-10-08 | Payer: Self-pay | Admitting: Emergency Medicine

## 2005-10-12 ENCOUNTER — Emergency Department (HOSPITAL_COMMUNITY): Admission: EM | Admit: 2005-10-12 | Discharge: 2005-10-12 | Payer: Self-pay | Admitting: Emergency Medicine

## 2005-10-22 ENCOUNTER — Emergency Department (HOSPITAL_COMMUNITY): Admission: EM | Admit: 2005-10-22 | Discharge: 2005-10-22 | Payer: Self-pay | Admitting: Emergency Medicine

## 2006-02-04 ENCOUNTER — Emergency Department (HOSPITAL_COMMUNITY): Admission: EM | Admit: 2006-02-04 | Discharge: 2006-02-05 | Payer: Self-pay | Admitting: Emergency Medicine

## 2006-03-26 ENCOUNTER — Emergency Department (HOSPITAL_COMMUNITY): Admission: EM | Admit: 2006-03-26 | Discharge: 2006-03-26 | Payer: Self-pay | Admitting: Emergency Medicine

## 2006-04-24 ENCOUNTER — Emergency Department (HOSPITAL_COMMUNITY): Admission: EM | Admit: 2006-04-24 | Discharge: 2006-04-24 | Payer: Self-pay | Admitting: Emergency Medicine

## 2006-05-30 ENCOUNTER — Emergency Department (HOSPITAL_COMMUNITY): Admission: EM | Admit: 2006-05-30 | Discharge: 2006-05-30 | Payer: Self-pay | Admitting: Emergency Medicine

## 2006-07-05 ENCOUNTER — Emergency Department (HOSPITAL_COMMUNITY): Admission: EM | Admit: 2006-07-05 | Discharge: 2006-07-05 | Payer: Self-pay | Admitting: Emergency Medicine

## 2006-09-06 ENCOUNTER — Emergency Department (HOSPITAL_COMMUNITY): Admission: EM | Admit: 2006-09-06 | Discharge: 2006-09-07 | Payer: Self-pay | Admitting: Emergency Medicine

## 2006-09-18 ENCOUNTER — Emergency Department (HOSPITAL_COMMUNITY): Admission: EM | Admit: 2006-09-18 | Discharge: 2006-09-18 | Payer: Self-pay | Admitting: Emergency Medicine

## 2006-10-10 ENCOUNTER — Emergency Department (HOSPITAL_COMMUNITY): Admission: EM | Admit: 2006-10-10 | Discharge: 2006-10-10 | Payer: Self-pay | Admitting: Emergency Medicine

## 2006-11-12 ENCOUNTER — Emergency Department (HOSPITAL_COMMUNITY): Admission: EM | Admit: 2006-11-12 | Discharge: 2006-11-12 | Payer: Self-pay | Admitting: Emergency Medicine

## 2006-11-23 ENCOUNTER — Emergency Department (HOSPITAL_COMMUNITY): Admission: EM | Admit: 2006-11-23 | Discharge: 2006-11-23 | Payer: Self-pay | Admitting: Emergency Medicine

## 2006-12-07 ENCOUNTER — Emergency Department (HOSPITAL_COMMUNITY): Admission: EM | Admit: 2006-12-07 | Discharge: 2006-12-07 | Payer: Self-pay | Admitting: Emergency Medicine

## 2006-12-21 ENCOUNTER — Emergency Department (HOSPITAL_COMMUNITY): Admission: EM | Admit: 2006-12-21 | Discharge: 2006-12-21 | Payer: Self-pay | Admitting: Emergency Medicine

## 2007-01-02 ENCOUNTER — Emergency Department (HOSPITAL_COMMUNITY): Admission: EM | Admit: 2007-01-02 | Discharge: 2007-01-02 | Payer: Self-pay | Admitting: Emergency Medicine

## 2007-01-06 ENCOUNTER — Emergency Department (HOSPITAL_COMMUNITY): Admission: EM | Admit: 2007-01-06 | Discharge: 2007-01-06 | Payer: Self-pay | Admitting: Emergency Medicine

## 2007-01-21 ENCOUNTER — Emergency Department (HOSPITAL_COMMUNITY): Admission: EM | Admit: 2007-01-21 | Discharge: 2007-01-21 | Payer: Self-pay | Admitting: Emergency Medicine

## 2007-02-05 ENCOUNTER — Emergency Department (HOSPITAL_COMMUNITY): Admission: EM | Admit: 2007-02-05 | Discharge: 2007-02-05 | Payer: Self-pay | Admitting: Emergency Medicine

## 2007-02-19 ENCOUNTER — Emergency Department (HOSPITAL_COMMUNITY): Admission: EM | Admit: 2007-02-19 | Discharge: 2007-02-19 | Payer: Self-pay | Admitting: Emergency Medicine

## 2007-03-10 ENCOUNTER — Emergency Department (HOSPITAL_COMMUNITY): Admission: EM | Admit: 2007-03-10 | Discharge: 2007-03-11 | Payer: Self-pay | Admitting: Emergency Medicine

## 2007-04-25 ENCOUNTER — Emergency Department (HOSPITAL_COMMUNITY): Admission: EM | Admit: 2007-04-25 | Discharge: 2007-04-25 | Payer: Self-pay | Admitting: Emergency Medicine

## 2007-04-29 ENCOUNTER — Emergency Department (HOSPITAL_COMMUNITY): Admission: EM | Admit: 2007-04-29 | Discharge: 2007-04-29 | Payer: Self-pay | Admitting: Emergency Medicine

## 2007-05-12 ENCOUNTER — Emergency Department (HOSPITAL_COMMUNITY): Admission: EM | Admit: 2007-05-12 | Discharge: 2007-05-13 | Payer: Self-pay | Admitting: Emergency Medicine

## 2007-05-22 ENCOUNTER — Emergency Department (HOSPITAL_COMMUNITY): Admission: EM | Admit: 2007-05-22 | Discharge: 2007-05-22 | Payer: Self-pay | Admitting: Emergency Medicine

## 2007-06-06 ENCOUNTER — Emergency Department (HOSPITAL_COMMUNITY): Admission: EM | Admit: 2007-06-06 | Discharge: 2007-06-07 | Payer: Self-pay | Admitting: Emergency Medicine

## 2007-06-08 ENCOUNTER — Emergency Department (HOSPITAL_COMMUNITY): Admission: EM | Admit: 2007-06-08 | Discharge: 2007-06-08 | Payer: Self-pay | Admitting: Emergency Medicine

## 2007-06-26 ENCOUNTER — Emergency Department (HOSPITAL_COMMUNITY): Admission: EM | Admit: 2007-06-26 | Discharge: 2007-06-26 | Payer: Self-pay | Admitting: Emergency Medicine

## 2007-07-06 ENCOUNTER — Emergency Department (HOSPITAL_COMMUNITY): Admission: EM | Admit: 2007-07-06 | Discharge: 2007-07-06 | Payer: Self-pay | Admitting: Emergency Medicine

## 2007-07-10 ENCOUNTER — Emergency Department (HOSPITAL_COMMUNITY): Admission: EM | Admit: 2007-07-10 | Discharge: 2007-07-10 | Payer: Self-pay | Admitting: Emergency Medicine

## 2007-07-13 ENCOUNTER — Emergency Department (HOSPITAL_COMMUNITY): Admission: EM | Admit: 2007-07-13 | Discharge: 2007-07-13 | Payer: Self-pay | Admitting: Emergency Medicine

## 2007-07-16 ENCOUNTER — Emergency Department (HOSPITAL_COMMUNITY): Admission: EM | Admit: 2007-07-16 | Discharge: 2007-07-16 | Payer: Self-pay | Admitting: Emergency Medicine

## 2007-07-16 IMAGING — CR DG CHEST 2V
2 series · 2 of 2 positions shown · non-contrast
Comparison: [DATE].

CLINICAL DATA: Chest pain today.  Asthma.  
 CHEST - 2 VIEW:

[view not recorded (1 of 2)]
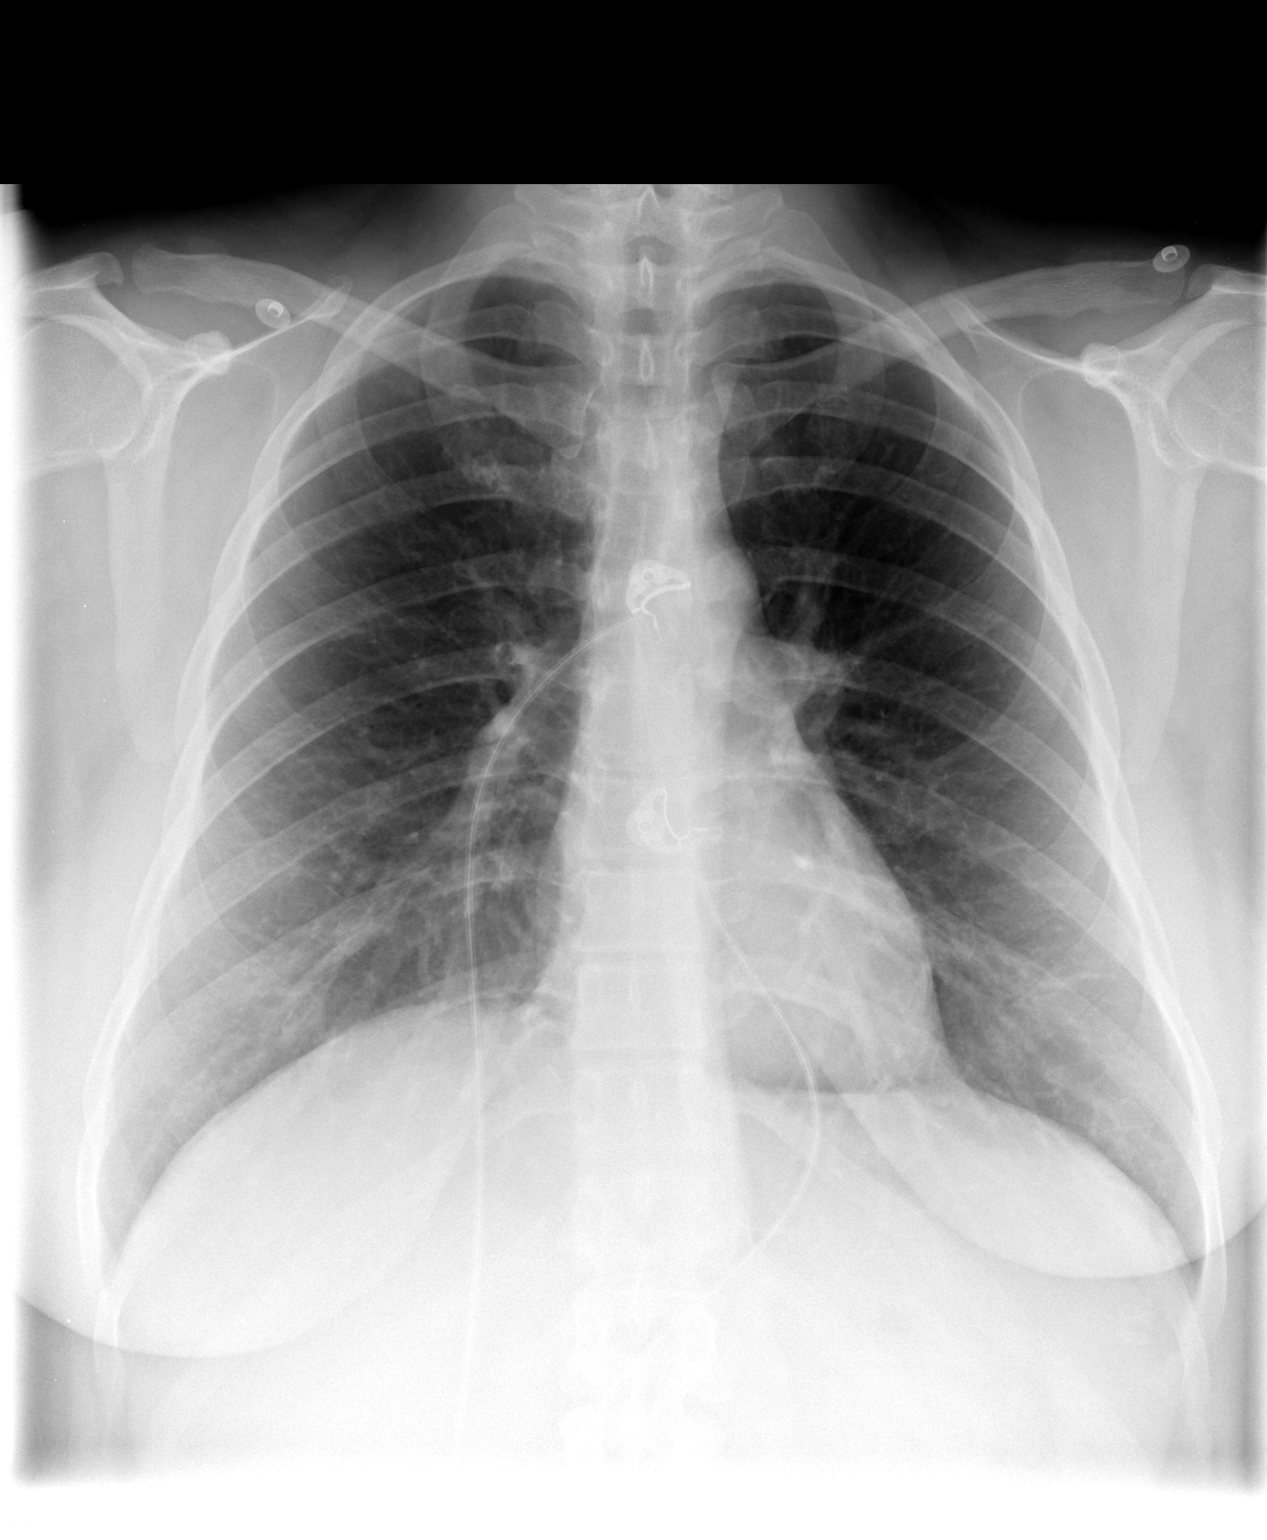

[view not recorded (2 of 2)]
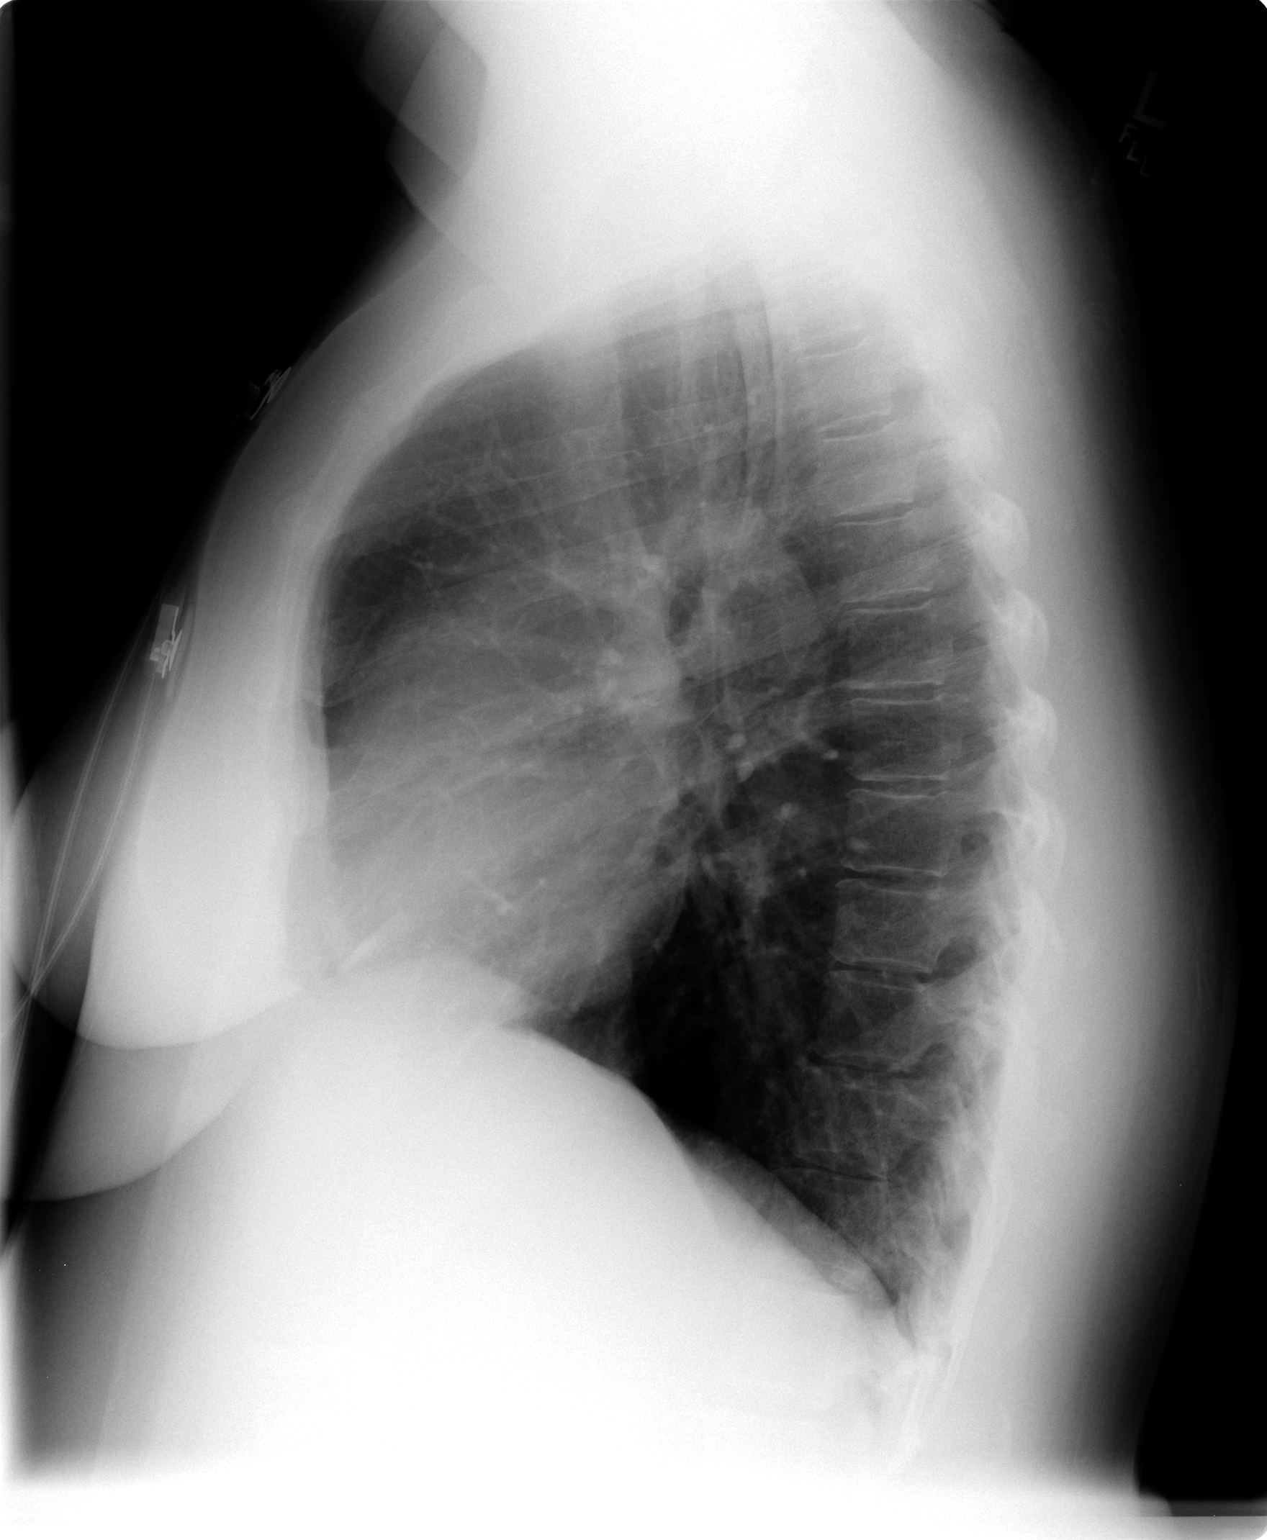

[2 of 2 positions shown; findings below may reference images not displayed]

FINDINGS: Lungs are hyperaerated with diaphragmatic flattening and prominence of the retrosternal clear space.  No active infiltrate.  Minimal linear scarring in the lingula.  Normal cardiomediastinal silhouette size and contours.
IMPRESSION: Pulmonary hyperaeration.

## 2007-08-02 ENCOUNTER — Emergency Department (HOSPITAL_COMMUNITY): Admission: EM | Admit: 2007-08-02 | Discharge: 2007-08-02 | Payer: Self-pay | Admitting: Emergency Medicine

## 2007-08-09 ENCOUNTER — Emergency Department (HOSPITAL_COMMUNITY): Admission: EM | Admit: 2007-08-09 | Discharge: 2007-08-09 | Payer: Self-pay | Admitting: Emergency Medicine

## 2007-10-15 ENCOUNTER — Emergency Department (HOSPITAL_COMMUNITY): Admission: EM | Admit: 2007-10-15 | Discharge: 2007-10-15 | Payer: Self-pay | Admitting: Emergency Medicine

## 2007-11-13 ENCOUNTER — Emergency Department (HOSPITAL_COMMUNITY): Admission: EM | Admit: 2007-11-13 | Discharge: 2007-11-13 | Payer: Self-pay | Admitting: Emergency Medicine

## 2008-01-16 ENCOUNTER — Emergency Department (HOSPITAL_COMMUNITY): Admission: EM | Admit: 2008-01-16 | Discharge: 2008-01-16 | Payer: Self-pay | Admitting: Emergency Medicine

## 2008-02-15 ENCOUNTER — Emergency Department (HOSPITAL_COMMUNITY): Admission: EM | Admit: 2008-02-15 | Discharge: 2008-02-16 | Payer: Self-pay | Admitting: Emergency Medicine

## 2008-05-02 ENCOUNTER — Emergency Department (HOSPITAL_COMMUNITY): Admission: EM | Admit: 2008-05-02 | Discharge: 2008-05-02 | Payer: Self-pay | Admitting: *Deleted

## 2009-03-04 ENCOUNTER — Emergency Department (HOSPITAL_COMMUNITY): Admission: EM | Admit: 2009-03-04 | Discharge: 2009-03-04 | Payer: Self-pay | Admitting: Emergency Medicine

## 2010-02-13 ENCOUNTER — Emergency Department (HOSPITAL_COMMUNITY): Admission: EM | Admit: 2010-02-13 | Discharge: 2010-02-13 | Payer: Self-pay | Admitting: Emergency Medicine

## 2010-03-18 ENCOUNTER — Emergency Department (HOSPITAL_COMMUNITY): Admission: EM | Admit: 2010-03-18 | Discharge: 2010-03-18 | Payer: Self-pay | Admitting: Emergency Medicine

## 2010-06-11 ENCOUNTER — Emergency Department (HOSPITAL_COMMUNITY): Admission: EM | Admit: 2010-06-11 | Discharge: 2010-06-11 | Payer: Self-pay | Admitting: Emergency Medicine

## 2010-07-01 ENCOUNTER — Emergency Department (HOSPITAL_COMMUNITY): Admission: EM | Admit: 2010-07-01 | Discharge: 2010-07-01 | Payer: Self-pay | Admitting: Emergency Medicine

## 2010-07-08 ENCOUNTER — Emergency Department (HOSPITAL_COMMUNITY): Admission: EM | Admit: 2010-07-08 | Discharge: 2010-07-08 | Payer: Self-pay | Admitting: Emergency Medicine

## 2010-07-11 ENCOUNTER — Emergency Department (HOSPITAL_COMMUNITY): Admission: EM | Admit: 2010-07-11 | Discharge: 2010-07-11 | Payer: Self-pay | Admitting: Emergency Medicine

## 2010-07-18 ENCOUNTER — Emergency Department (HOSPITAL_COMMUNITY): Admission: EM | Admit: 2010-07-18 | Discharge: 2010-07-18 | Payer: Self-pay | Admitting: Emergency Medicine

## 2010-07-24 ENCOUNTER — Emergency Department (HOSPITAL_COMMUNITY): Admission: EM | Admit: 2010-07-24 | Discharge: 2010-07-24 | Payer: Self-pay | Admitting: Emergency Medicine

## 2010-07-24 IMAGING — CR DG CHEST 2V
2 series · 2 of 2 positions shown · non-contrast
Comparison: [DATE]

CLINICAL DATA: Cough and shortness of breath

CHEST - 2 VIEW

[view not recorded (1 of 2)]
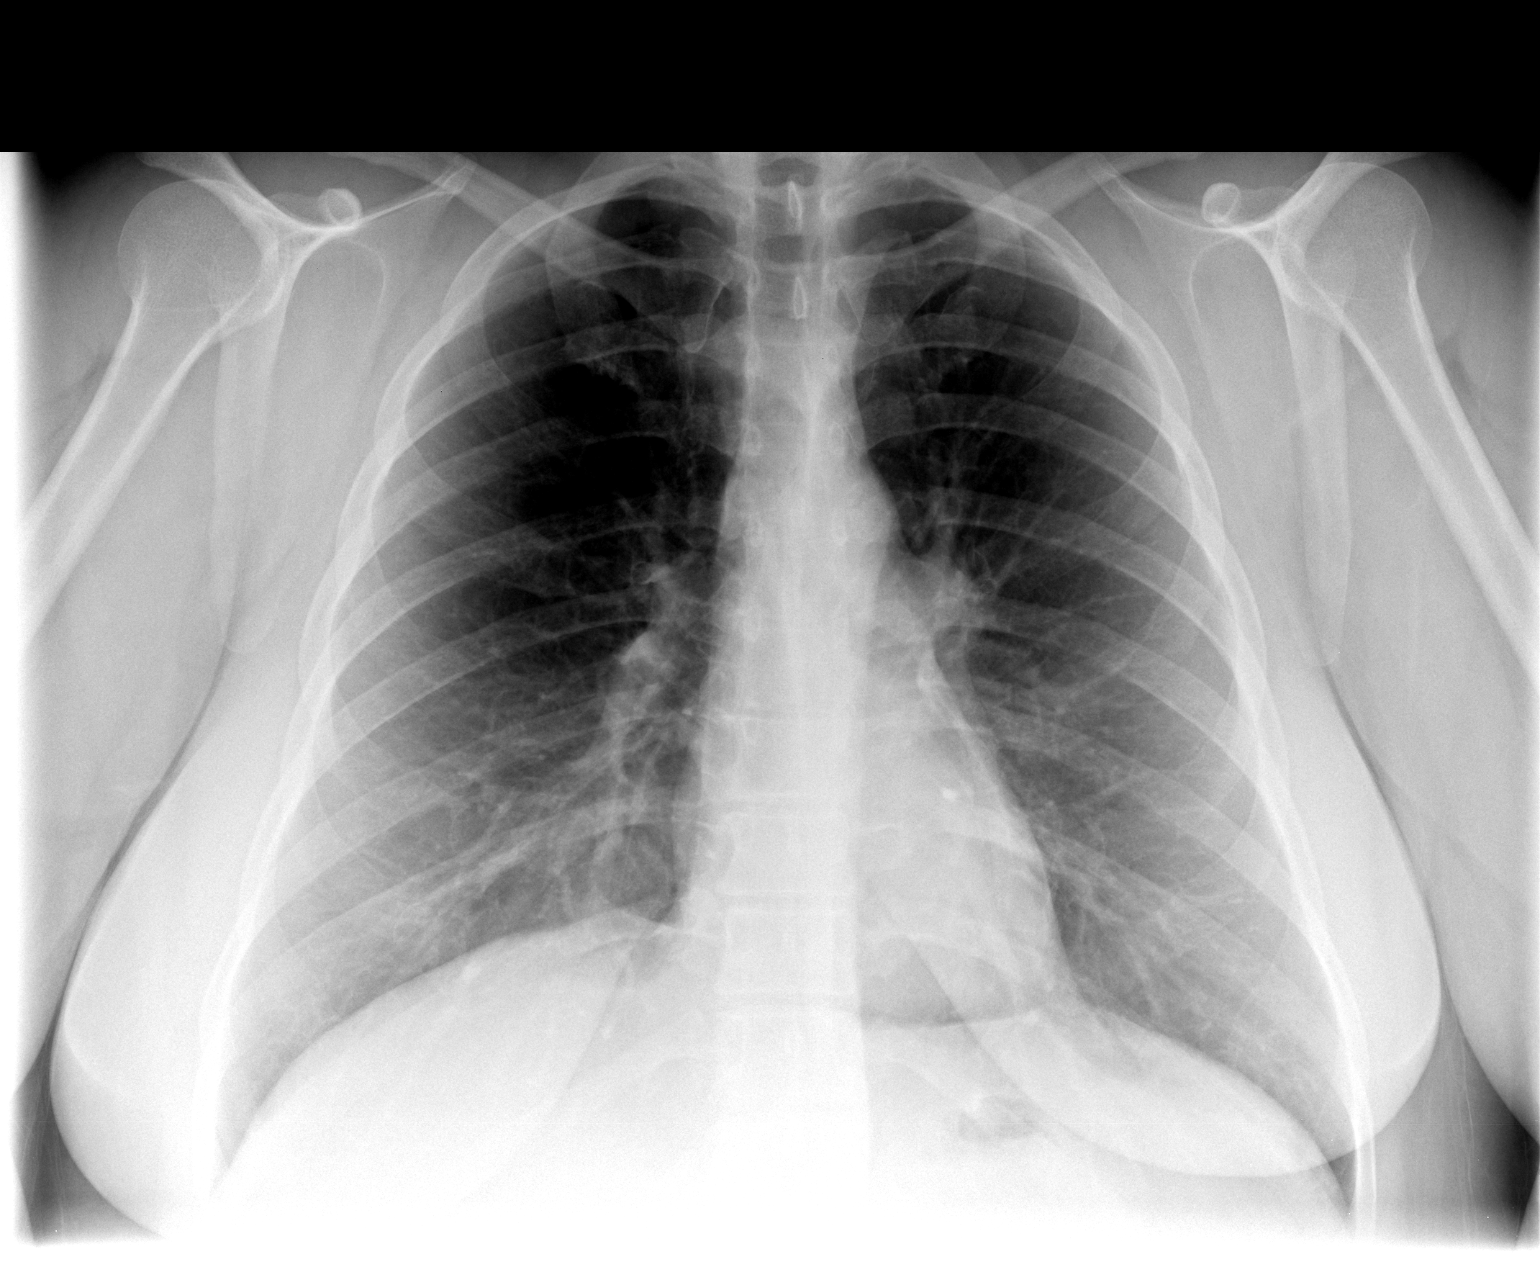

[view not recorded (2 of 2)]
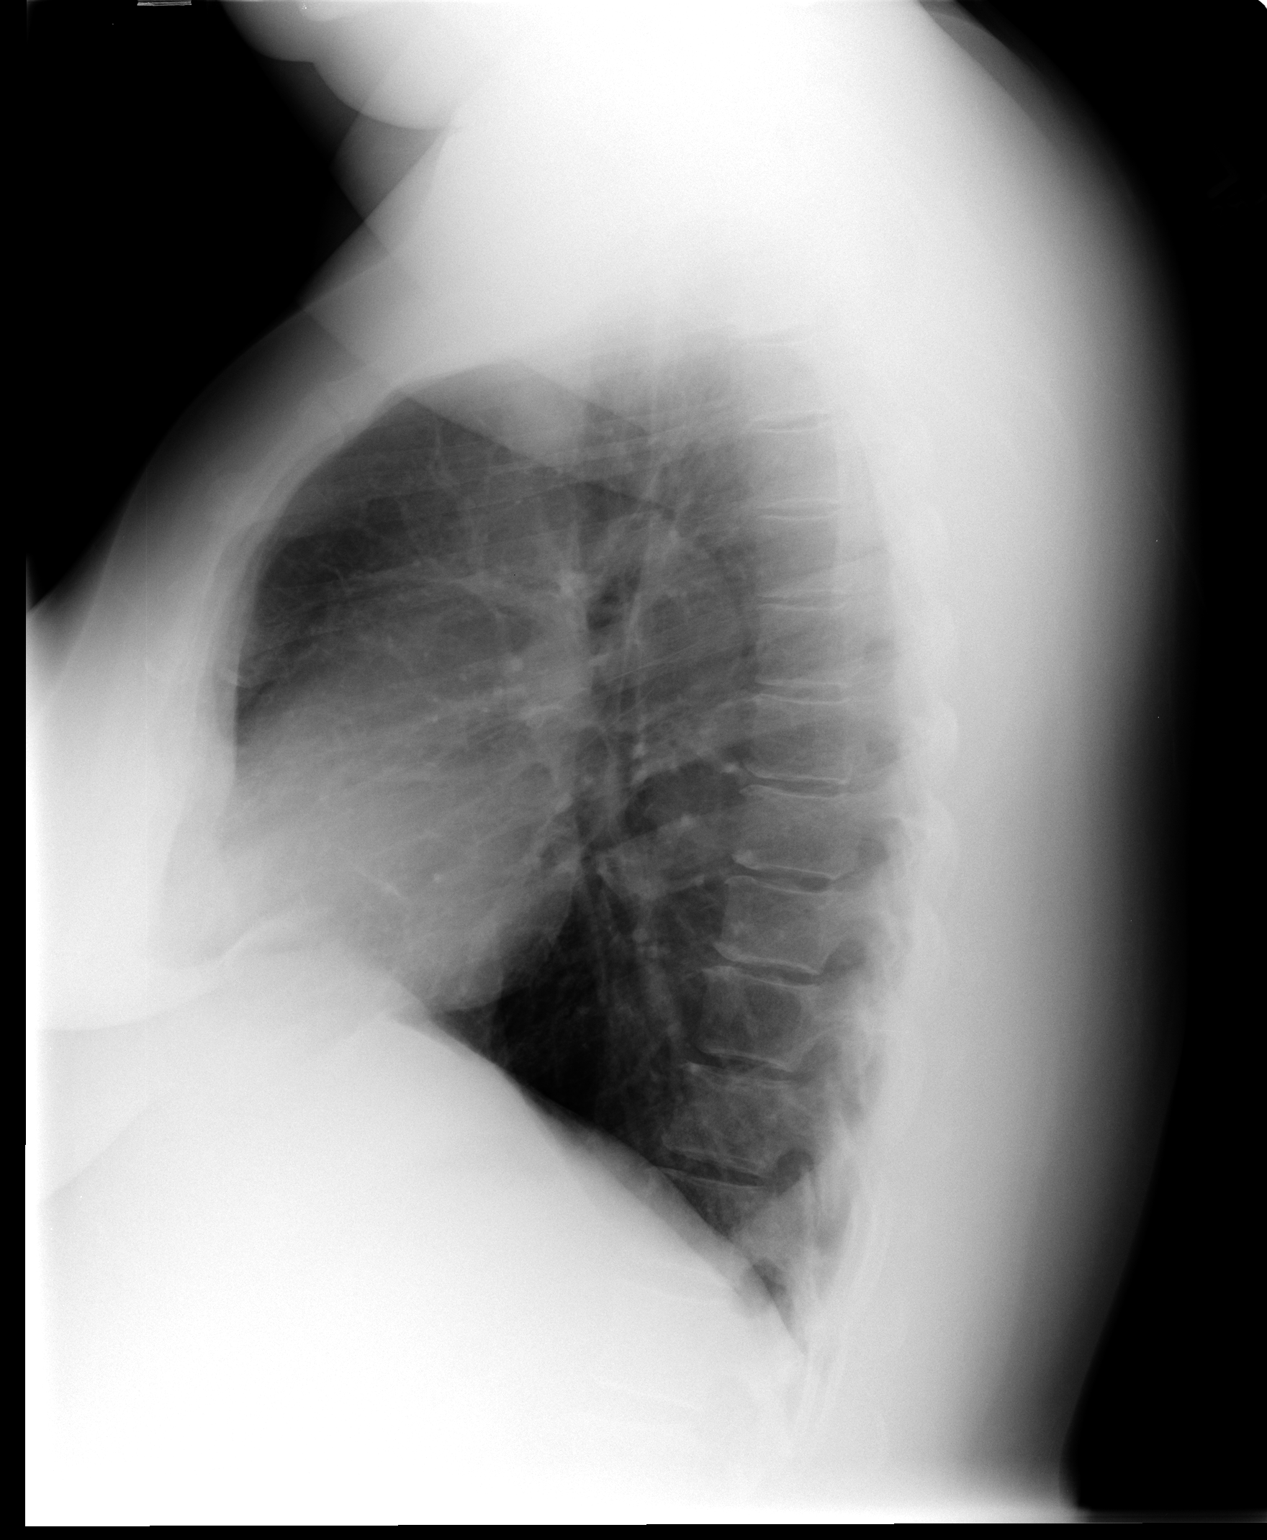

[2 of 2 positions shown; findings below may reference images not displayed]

FINDINGS: Cardiomediastinal silhouette is within normal limits. The
lungs are clear. No pleural effusion.  No pneumothorax.  No acute
osseous abnormality.
IMPRESSION: Normal exam.

## 2010-08-10 ENCOUNTER — Emergency Department (HOSPITAL_COMMUNITY): Admission: EM | Admit: 2010-08-10 | Discharge: 2010-08-10 | Payer: Self-pay | Admitting: Emergency Medicine

## 2010-08-17 ENCOUNTER — Emergency Department (HOSPITAL_COMMUNITY): Admission: EM | Admit: 2010-08-17 | Discharge: 2010-08-18 | Payer: Self-pay | Admitting: Emergency Medicine

## 2010-08-18 ENCOUNTER — Emergency Department (HOSPITAL_COMMUNITY): Admission: EM | Admit: 2010-08-18 | Discharge: 2010-08-19 | Payer: Self-pay | Admitting: Emergency Medicine

## 2010-08-18 IMAGING — CT CT ABD-PELV W/ CM
2 of 4 series · 17 of 46 positions shown, 19 images · IV contrast (Omnipaque 300)
Comparison: [DATE].

CLINICAL DATA: Right lower quadrant pain.  No fever.  Post
cholecystectomy and tubal ligation.

CT ABDOMEN AND PELVIS WITH CONTRAST
TECHNIQUE: Multidetector CT imaging of the abdomen and pelvis was
performed following the standard protocol during bolus
administration of intravenous contrast.
Contrast: 100 ml [9X].

[Series 2: abd_pel_with 5.0 b40f · axial · 0.89mm/px · z∈[-470,-50]mm · 14 of 92 slices shown, 16 images]
[im 4/92  soft-tissue]
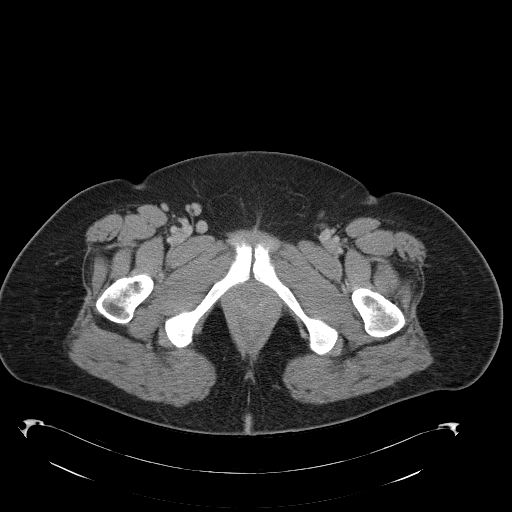
[im 4/92  bone]
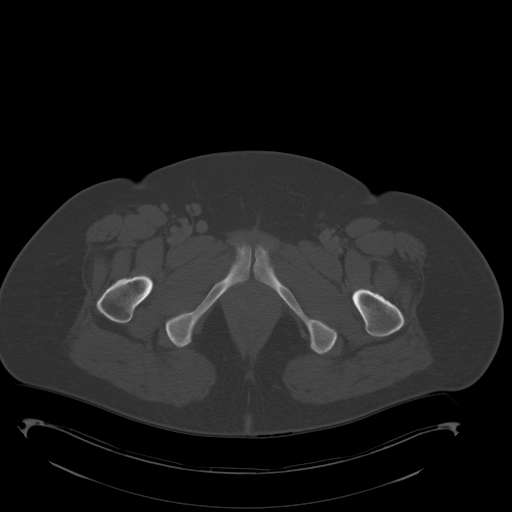
[im 12/92  soft-tissue]
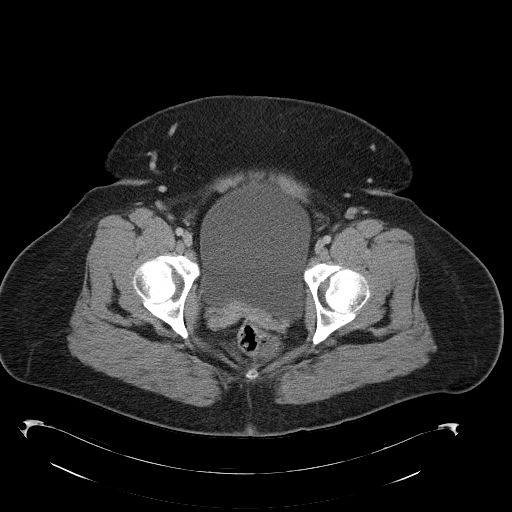
[im 19/92  soft-tissue]
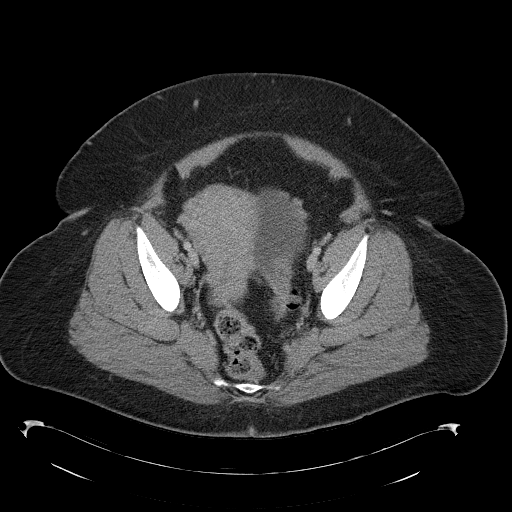
[im 23/92  soft-tissue]
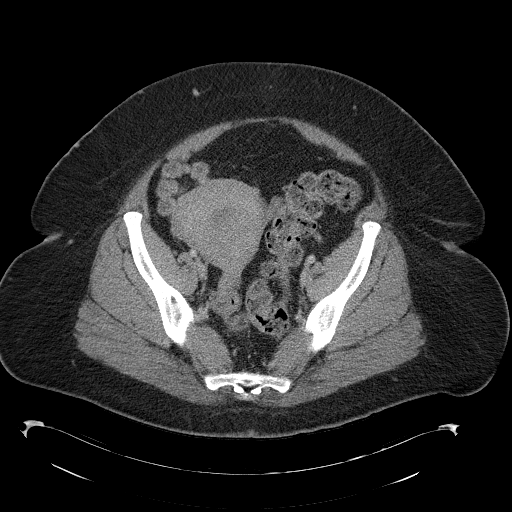
[im 31/92  soft-tissue]
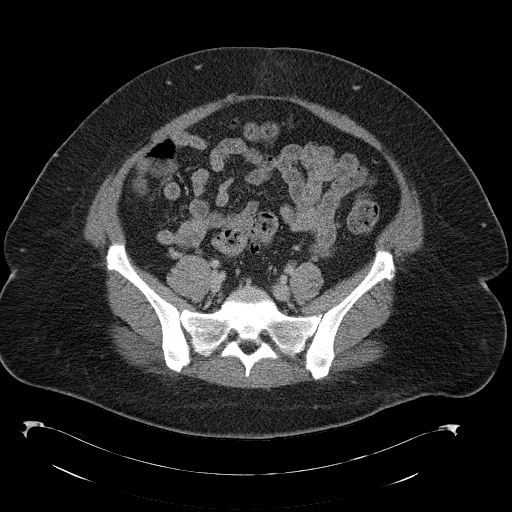
[im 38/92  soft-tissue]
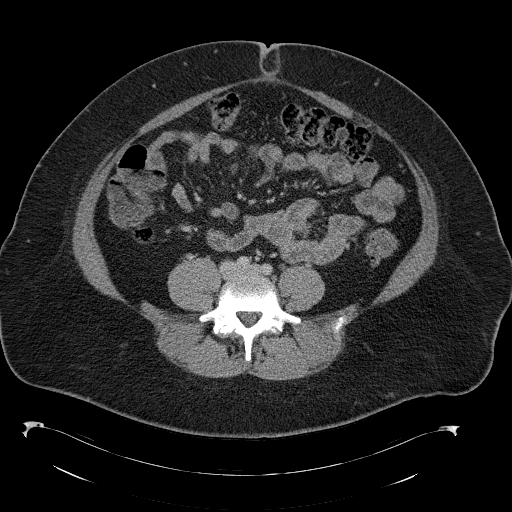
[im 42/92  soft-tissue]
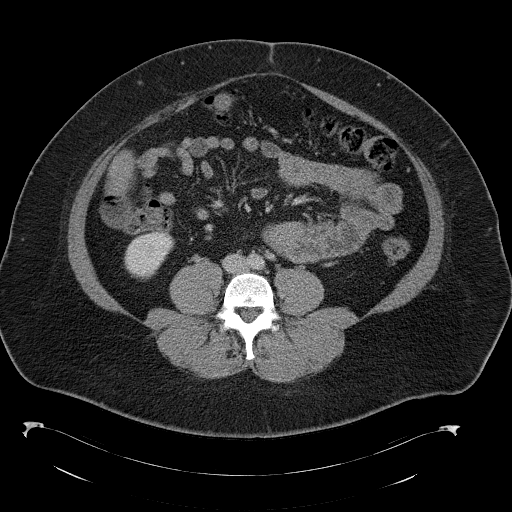
[im 50/92  soft-tissue]
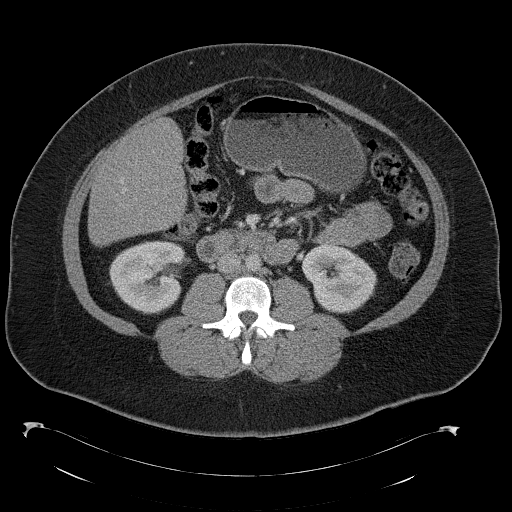
[im 54/92  soft-tissue]
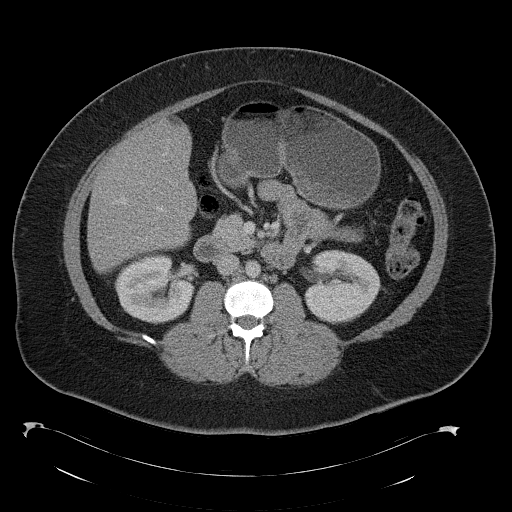
[im 54/92  bone]
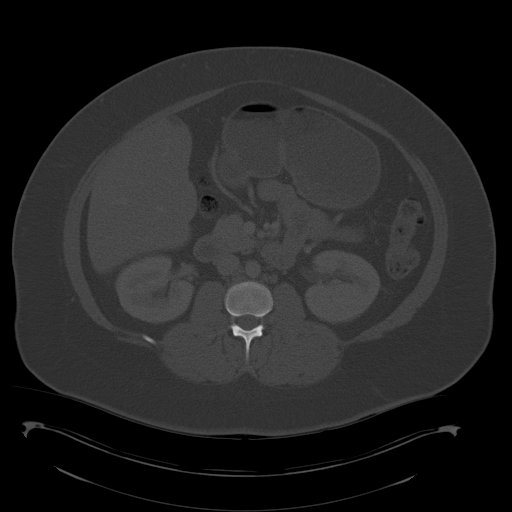
[im 61/92  soft-tissue]
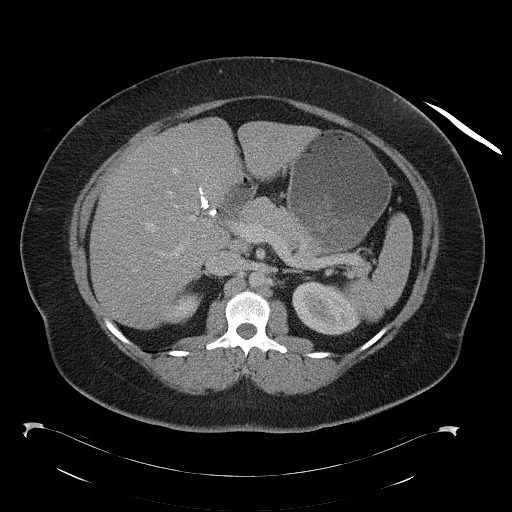
[im 69/92  soft-tissue]
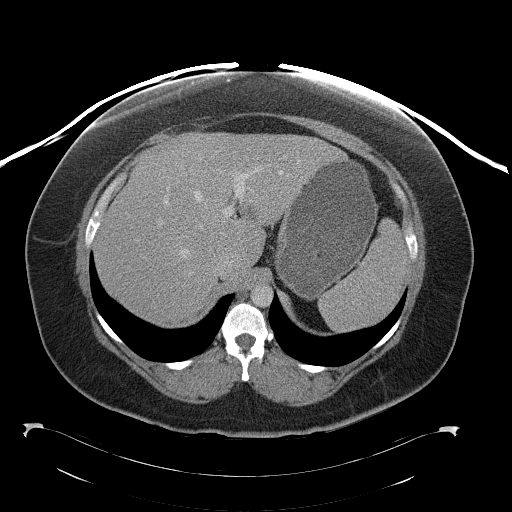
[im 73/92  soft-tissue]
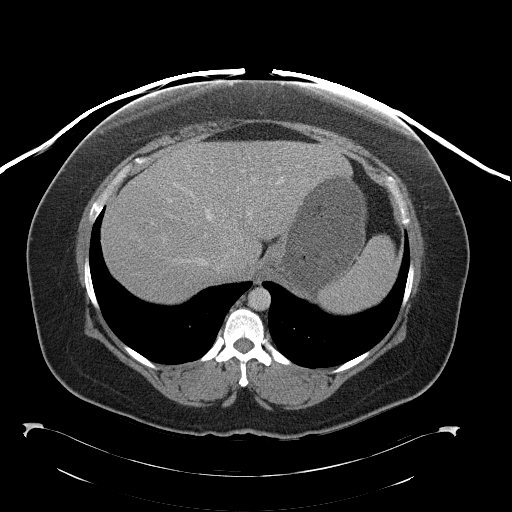
[im 80/92  soft-tissue]
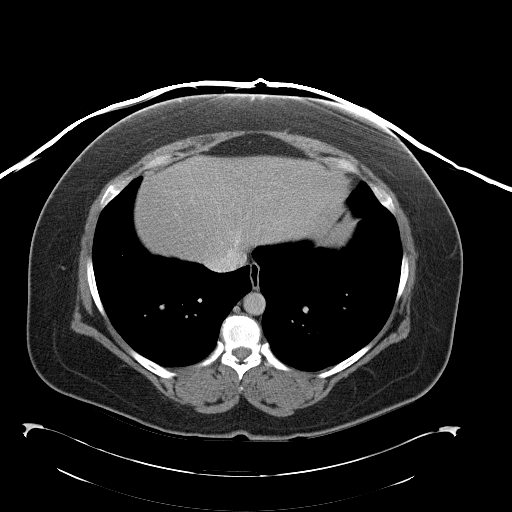
[im 88/92  soft-tissue]
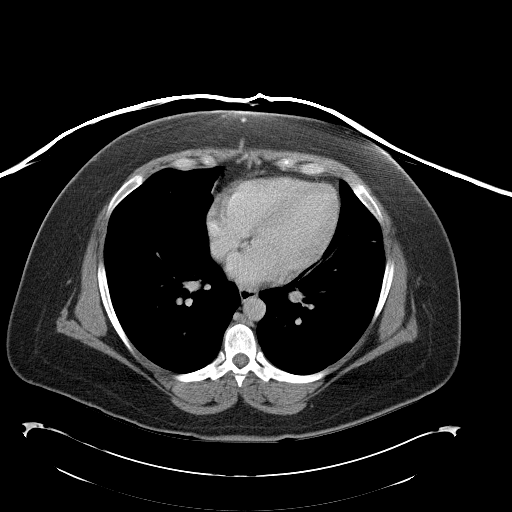

[Series 4: abd_pel_with 3.0 spo cor · coronal · 0.74mm/px · 3 of 102 slices shown]
[im 34/102  soft-tissue]
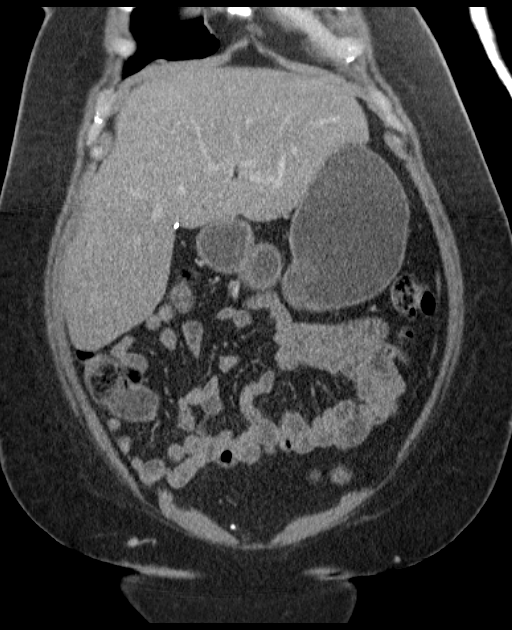
[im 45/102  soft-tissue]
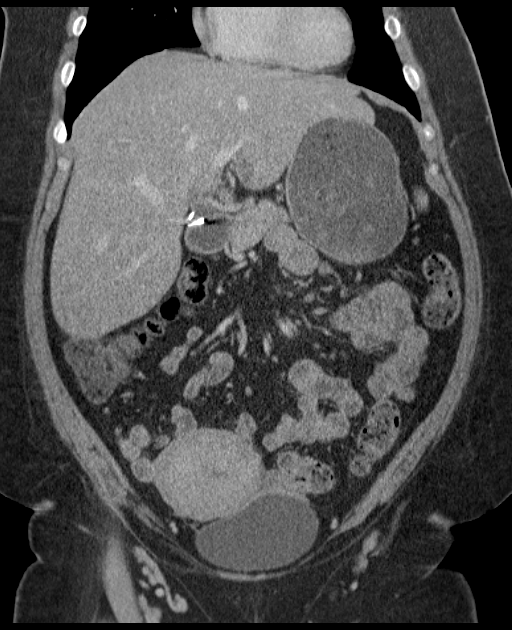
[im 57/102  soft-tissue]
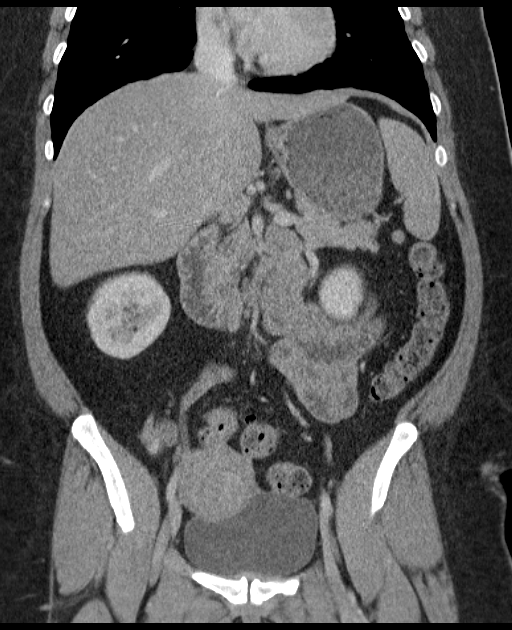

[17 of 46 positions shown; findings below may reference images not displayed]

FINDINGS: Fatty enlarged liver spanning over 22 cm.  Low density
lesion in the falciform ligament noted previously and may represent
prominent focal fatty infiltration.  Anterior  right lower lobe
liver 1.5 cm lesion possibly a cyst.  No other focal hepatic
lesion.

No focal splenic, adrenal, renal or pancreatic lesion.  No
abdominal aortic aneurysm.

No extraluminal inflammatory process.  The appendix does not appear
inflamed.

Slightly prominent size uterus tilted towards the right.  Urinary
bladder unremarkable.  No bony destructive lesion.
IMPRESSION: No inflammatory process identified.

Enlarged uterus tilted towards the right.

Fatty enlarged liver spanning over 22 cm.  Interval development of
1.5 cm low density lesion along the anterior aspect the right lobe
liver possibly a cyst.

## 2010-08-19 IMAGING — CT CT ABD-PELV W/O CM
2 of 4 series · 17 of 46 positions shown, 19 images · non-contrast
Comparison: [DATE]

CLINICAL DATA: Right flank and lower back pain, nausea, question
kidney stone

CT ABDOMEN AND PELVIS WITHOUT CONTRAST
TECHNIQUE: Multidetector CT imaging of the abdomen and pelvis was
performed following the standard protocol without intravenous
contrast. Breast shield utilized.  Sagittal and coronal MPR images
reconstructed from axial data set.

[Series 2: a/p w/o 5.0 b31f st · axial · non-contrast · 0.91mm/px · z∈[-342,+94]mm · 14 of 95 slices shown, 16 images]
[im 4/95  soft-tissue]
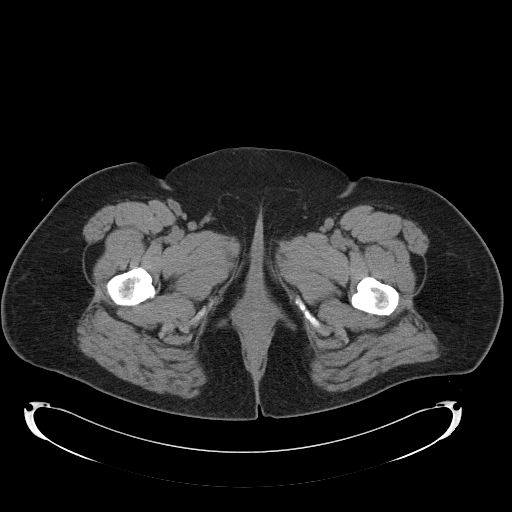
[im 4/95  bone]
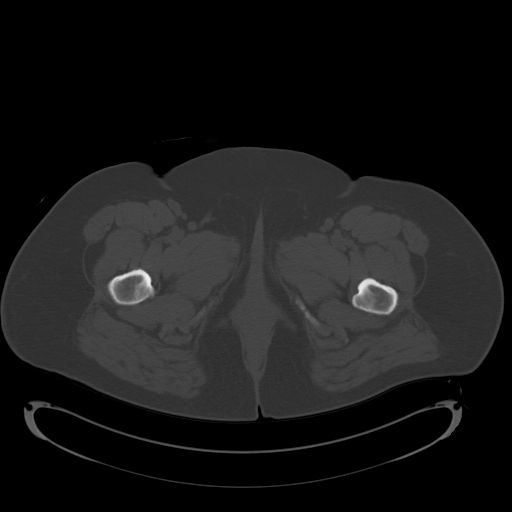
[im 12/95  soft-tissue]
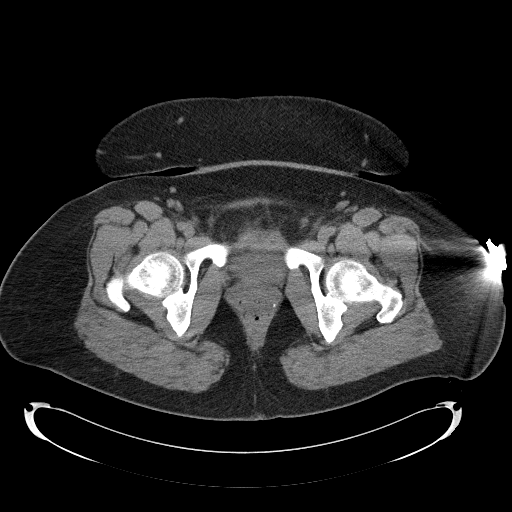
[im 20/95  soft-tissue]
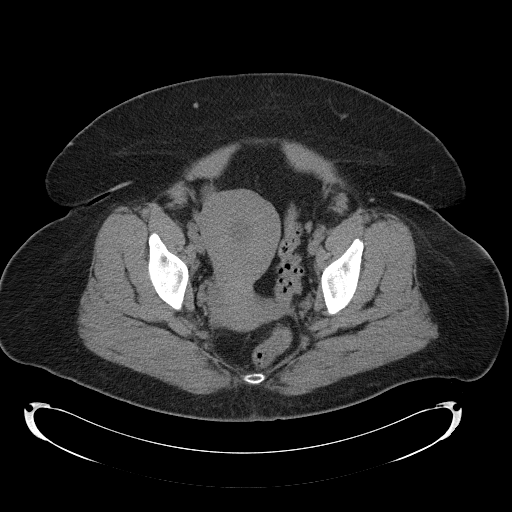
[im 24/95  soft-tissue]
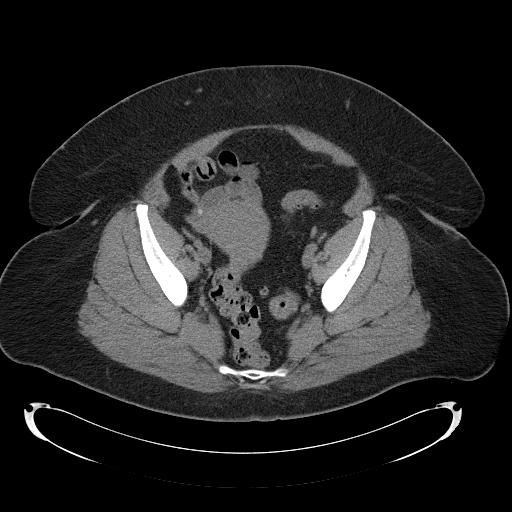
[im 32/95  soft-tissue]
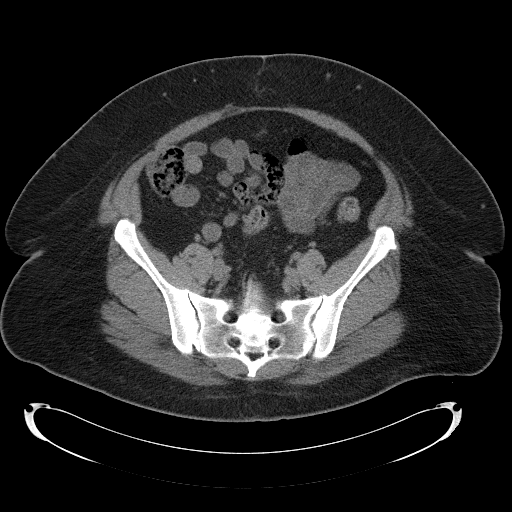
[im 40/95  soft-tissue]
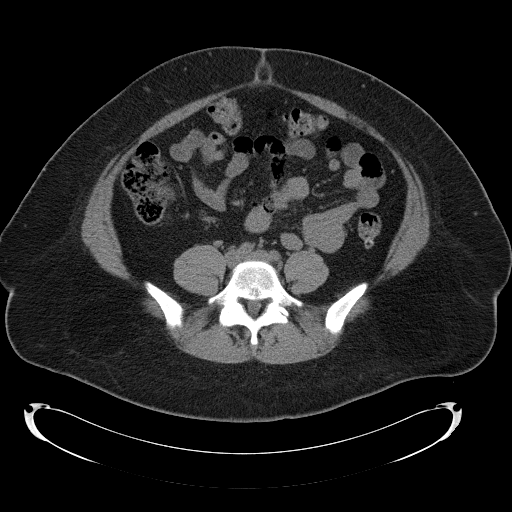
[im 44/95  soft-tissue]
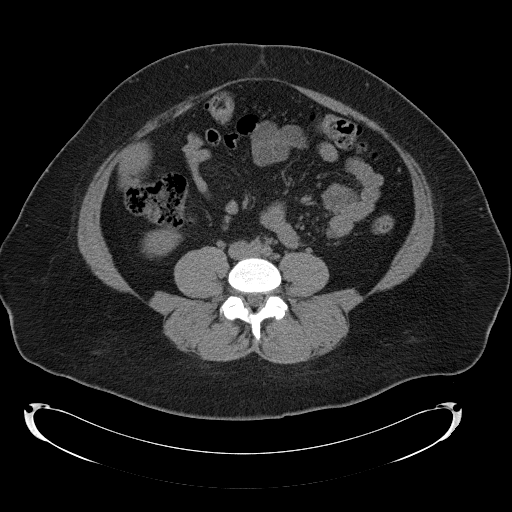
[im 51/95  soft-tissue]
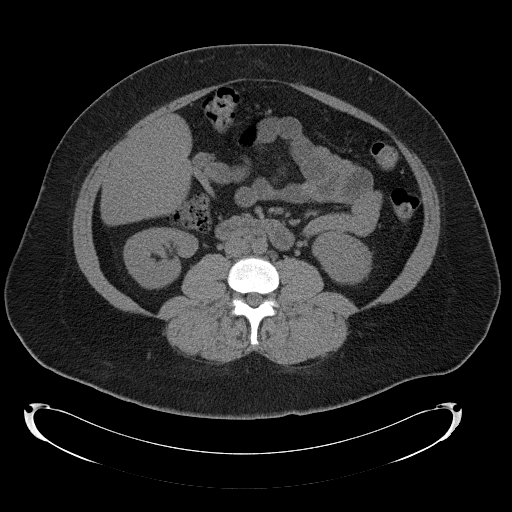
[im 55/95  soft-tissue]
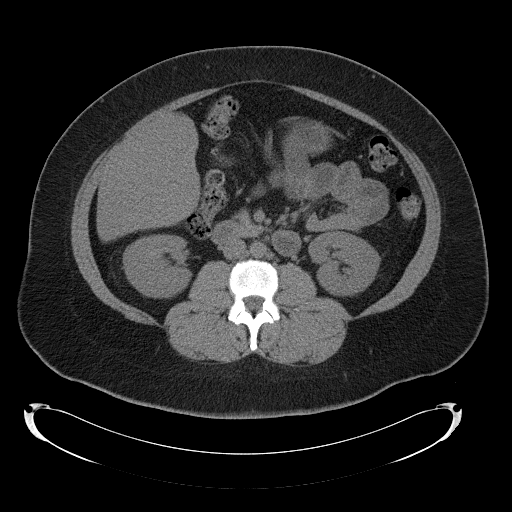
[im 55/95  bone]
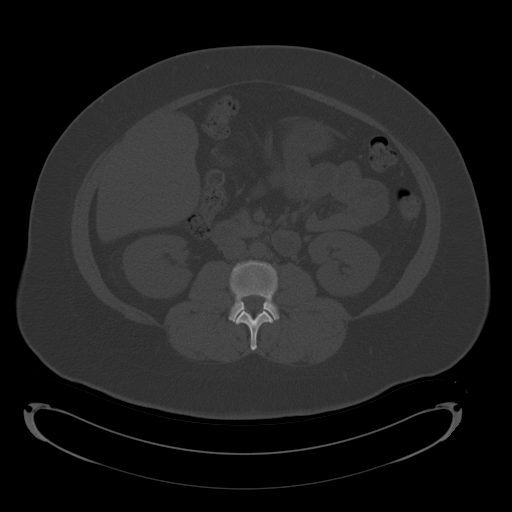
[im 63/95  soft-tissue]
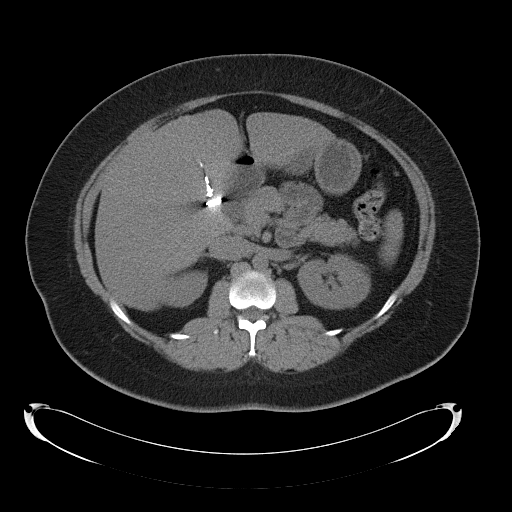
[im 71/95  soft-tissue]
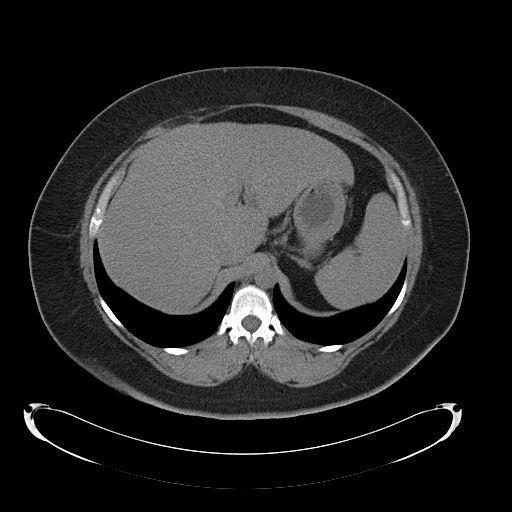
[im 75/95  soft-tissue]
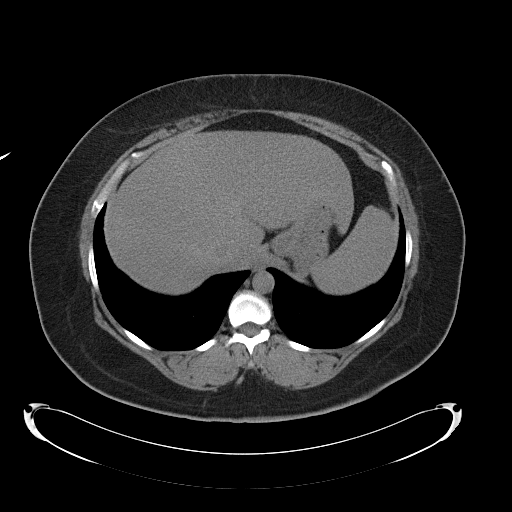
[im 83/95  soft-tissue]
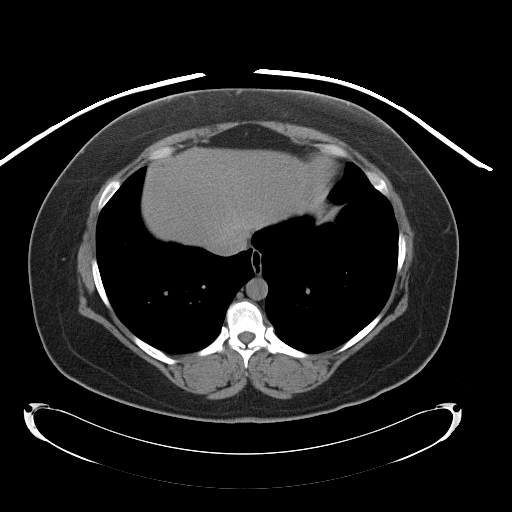
[im 91/95  soft-tissue]
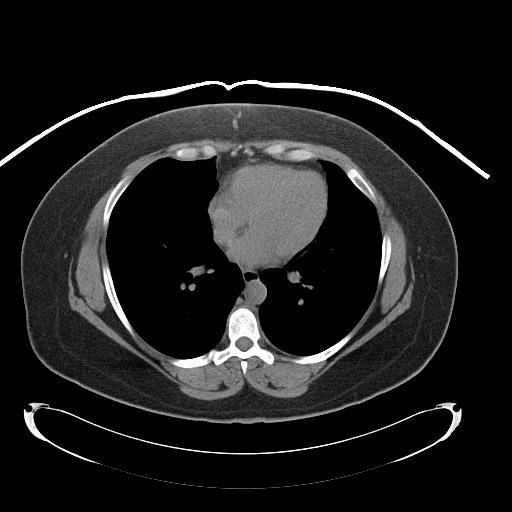

[Series 5: a/p w/o 2.0 spo cor st · coronal · non-contrast · 0.92mm/px · 3 of 334 slices shown]
[im 112/334  soft-tissue]
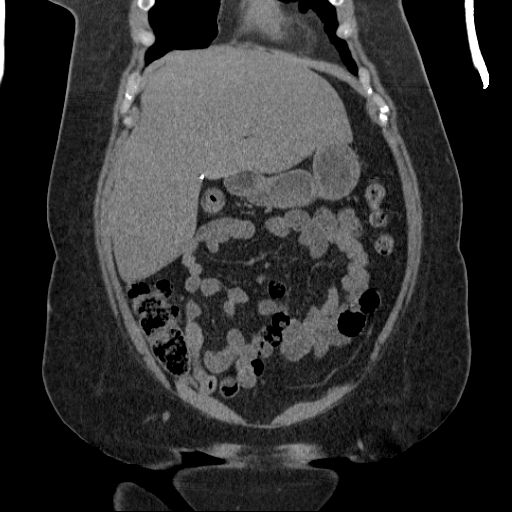
[im 149/334  soft-tissue]
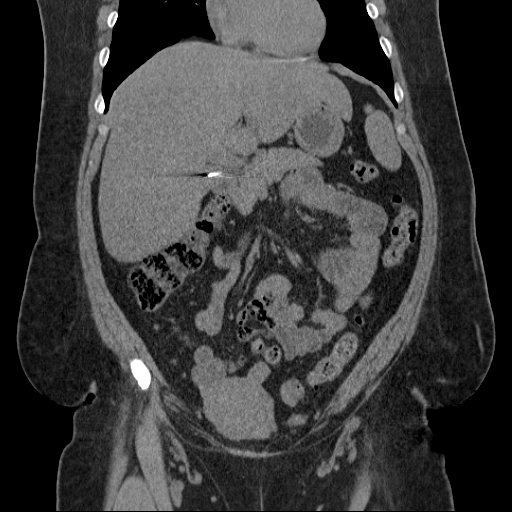
[im 186/334  soft-tissue]
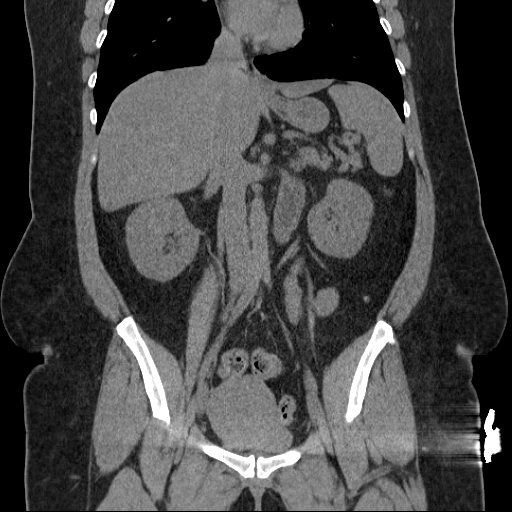

[17 of 46 positions shown; findings below may reference images not displayed]

FINDINGS: Lung bases clear.
Gallbladder surgically absent.
Focal fatty infiltration of liver adjacent to falciform fissure.
Small hepatic cyst 1.6 x 1.0 cm image 39.
Liver appears diffusely enlarged.
Liver, spleen, pancreas, kidneys, and adrenal glands otherwise
unremarkable for noncontrast technique.
Small umbilical hernia containing fat.
Unremarkable appendix.
Uterus slightly prominent size of focal mass.
Few scattered diverticula of descending and sigmoid colon without
evidence of diverticulitis.
Unremarkable adnexae.
No mass, adenopathy, free fluid or inflammatory process.
Bladder decompressed.
No ureteral calcification or dilatation.
Osseous structures unremarkable.
IMPRESSION: Minimal colonic diverticulosis.
Question hepatomegaly.
Small hepatic cyst.
Small umbilical hernia containing fat.
No other intra abdominal or intrapelvic abnormalities identified.

## 2010-08-30 ENCOUNTER — Emergency Department (HOSPITAL_COMMUNITY): Admission: EM | Admit: 2010-08-30 | Discharge: 2010-08-30 | Payer: Self-pay | Admitting: Emergency Medicine

## 2010-09-11 ENCOUNTER — Emergency Department (HOSPITAL_COMMUNITY): Admission: EM | Admit: 2010-09-11 | Discharge: 2010-09-11 | Payer: Self-pay | Admitting: Emergency Medicine

## 2010-09-11 IMAGING — CR DG KNEE COMPLETE 4+V*R*
4 series · 4 of 4 positions shown · non-contrast
Comparison: None

CLINICAL DATA: Right knee pain.  No injury.

RIGHT KNEE - COMPLETE 4+ VIEW

[view not recorded (1 of 4)]
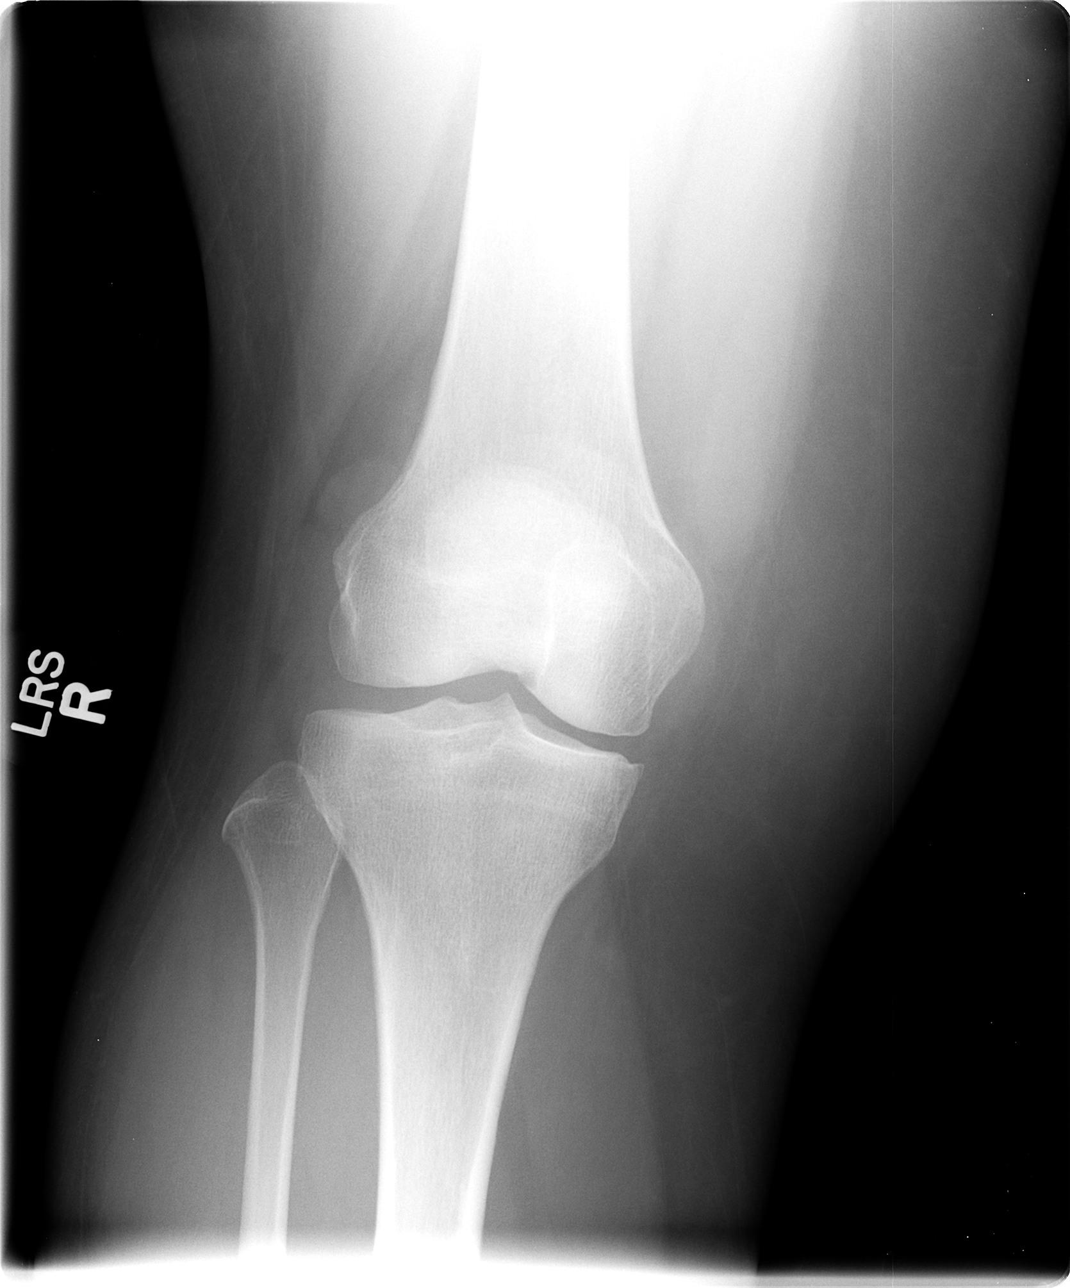

[view not recorded (2 of 4)]
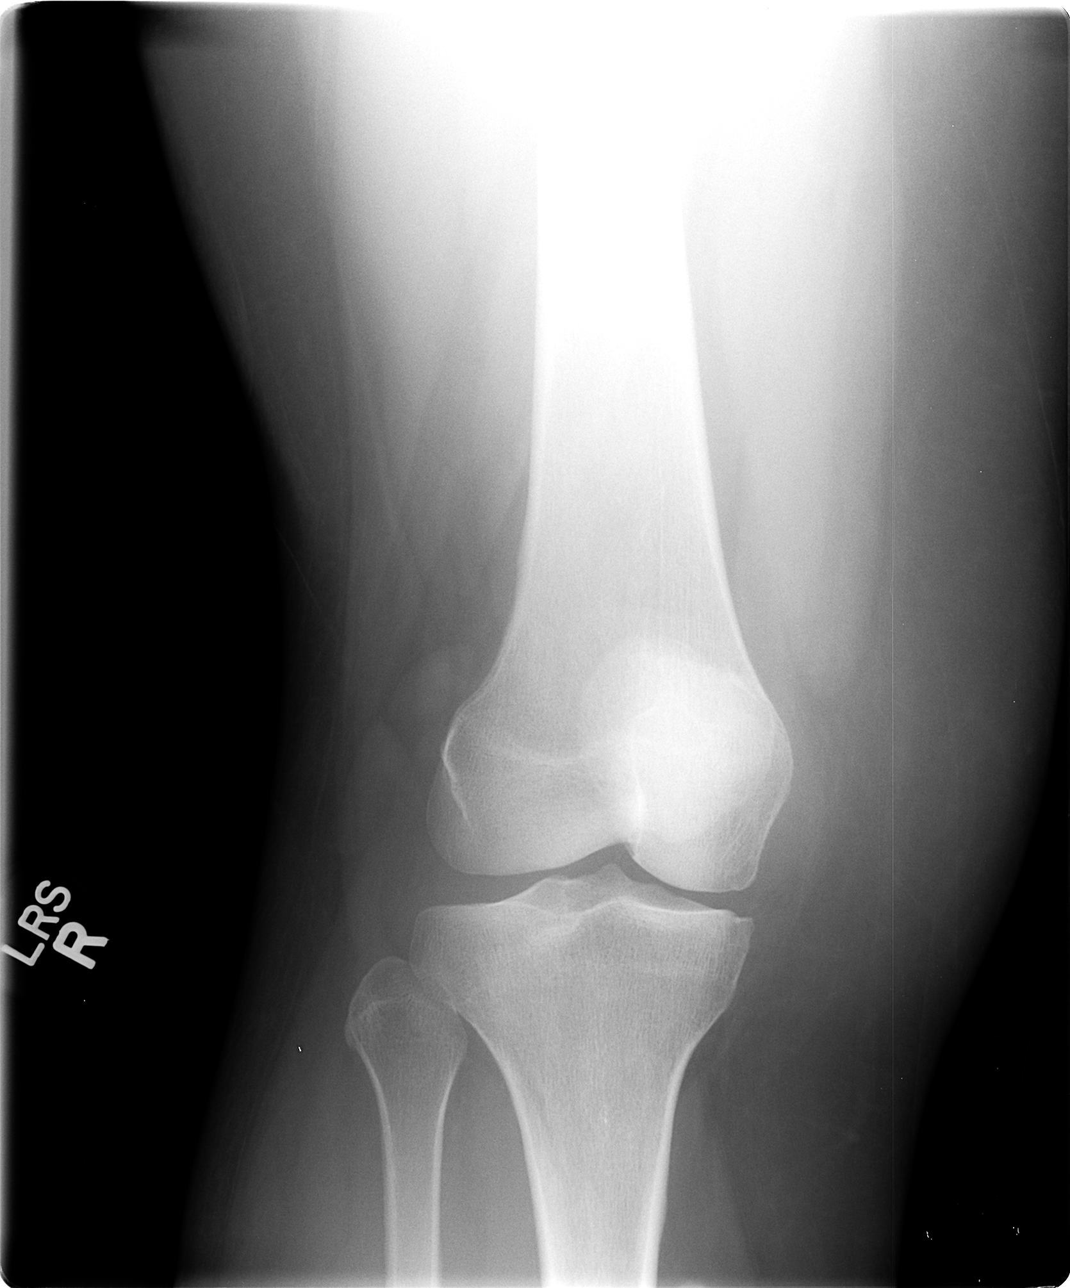

[view not recorded (3 of 4)]
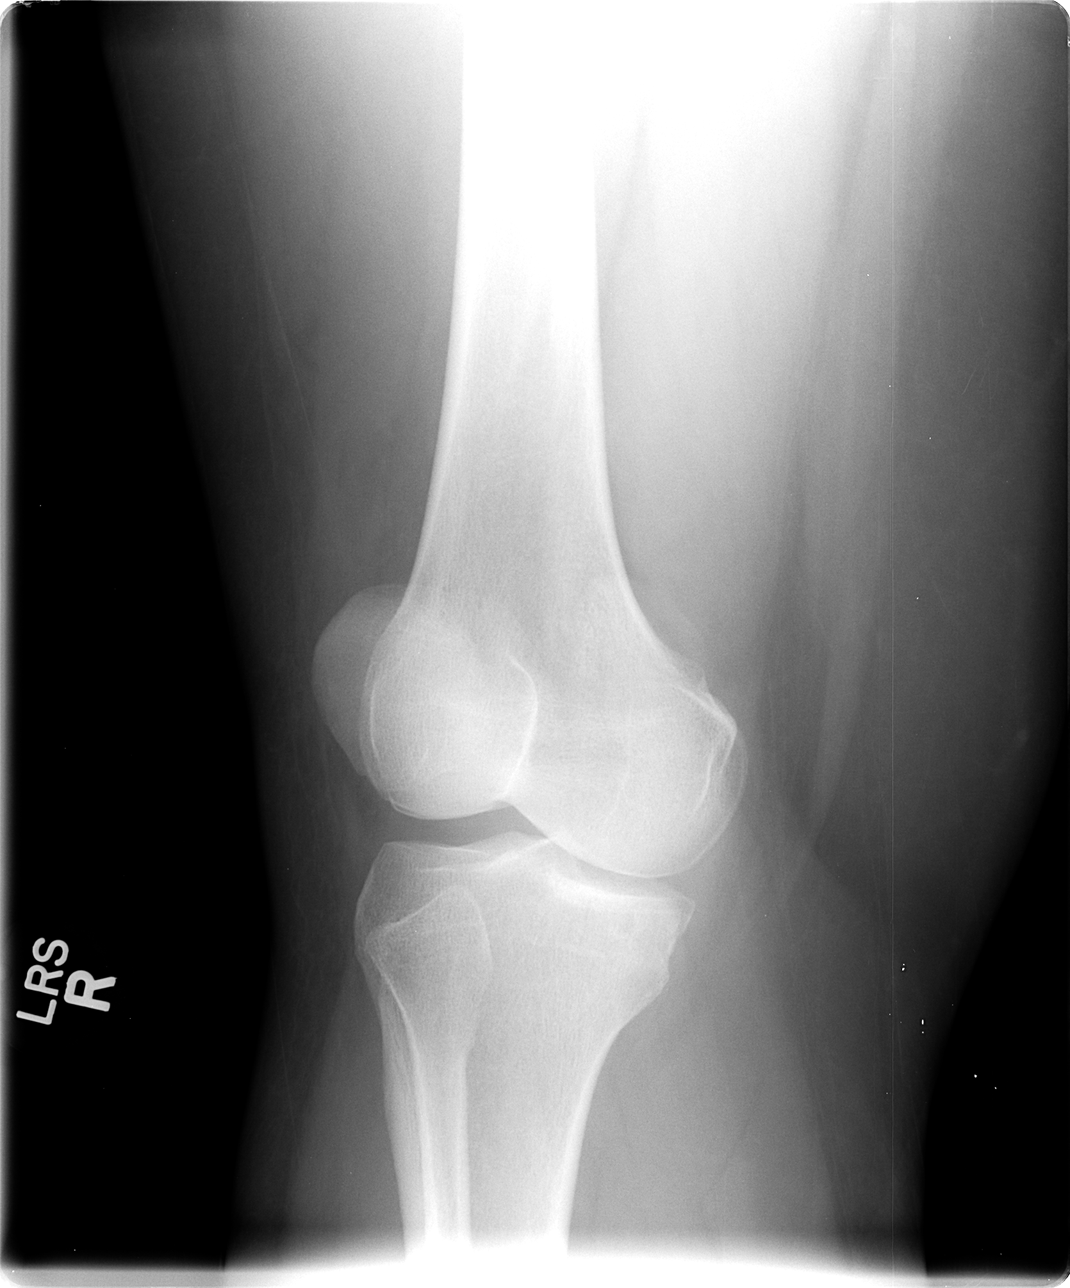

[view not recorded (4 of 4)]
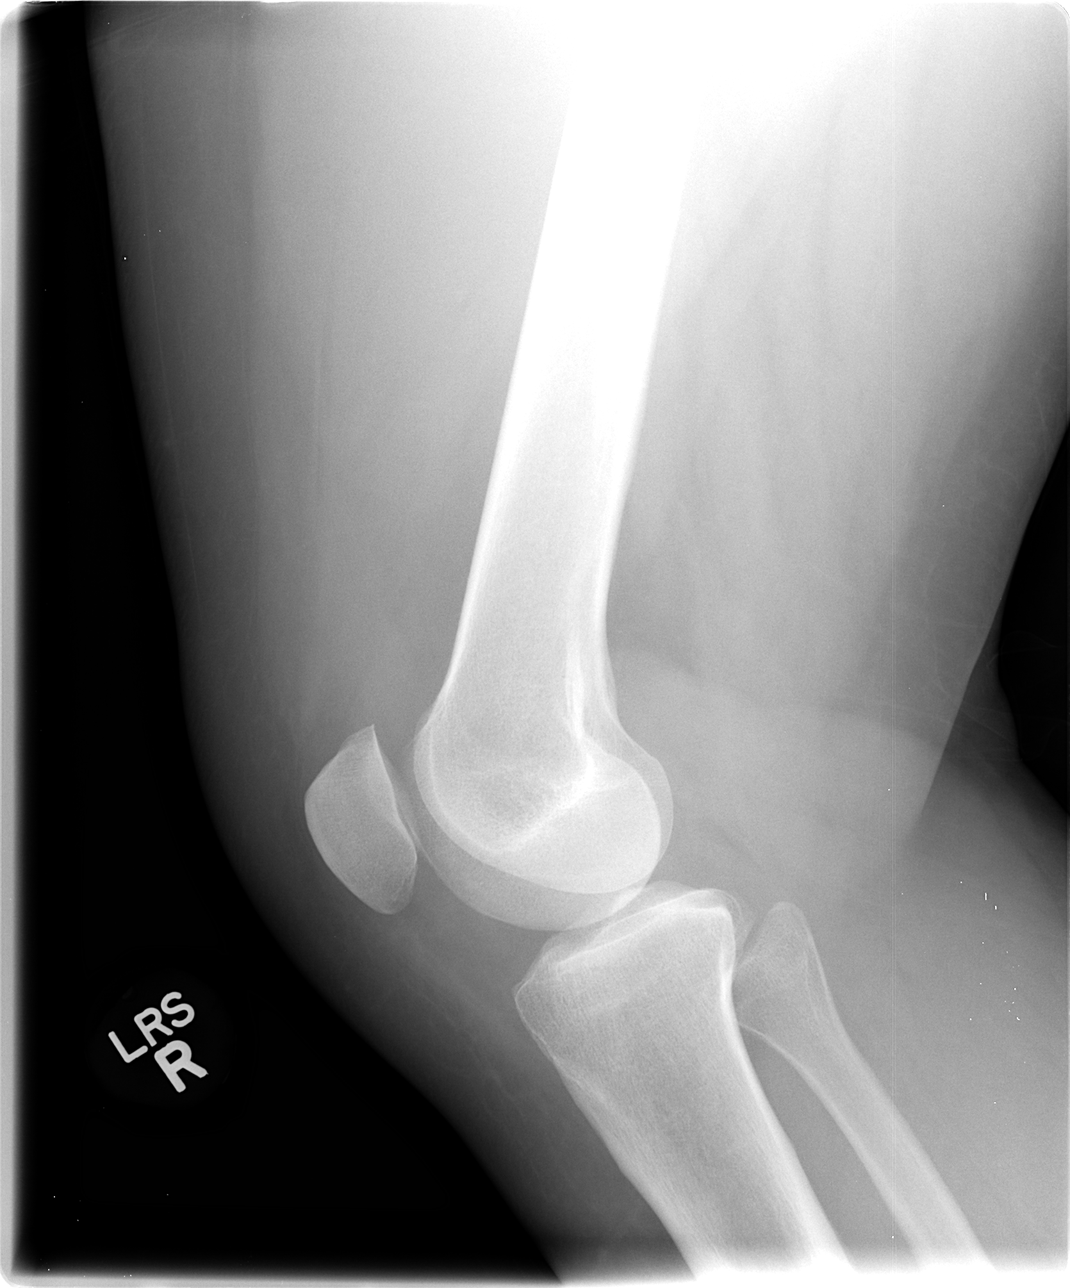

[4 of 4 positions shown; findings below may reference images not displayed]

FINDINGS: Early degenerative changes present.  Early spurring noted
in the medial compartment of the right knee. No acute bony
abnormality.  Specifically, no fracture, subluxation, or
dislocation.  Soft tissues are intact.  Small joint effusion is
present.
IMPRESSION: Small joint effusion.  Early spurring.  No acute findings.

## 2010-11-26 ENCOUNTER — Emergency Department (HOSPITAL_COMMUNITY)
Admission: EM | Admit: 2010-11-26 | Discharge: 2010-11-26 | Disposition: A | Discharge status: Home / Self Care | Payer: Self-pay | Attending: Emergency Medicine | Admitting: Emergency Medicine

## 2010-11-26 DIAGNOSIS — G43909 Migraine, unspecified, not intractable, without status migrainosus: Secondary | ICD-10-CM | POA: Insufficient documentation

## 2010-11-26 DIAGNOSIS — J069 Acute upper respiratory infection, unspecified: Secondary | ICD-10-CM | POA: Insufficient documentation

## 2010-11-26 DIAGNOSIS — G40909 Epilepsy, unspecified, not intractable, without status epilepticus: Secondary | ICD-10-CM | POA: Insufficient documentation

## 2010-11-26 DIAGNOSIS — I10 Essential (primary) hypertension: Secondary | ICD-10-CM | POA: Insufficient documentation

## 2010-11-26 DIAGNOSIS — F3289 Other specified depressive episodes: Secondary | ICD-10-CM | POA: Insufficient documentation

## 2010-11-26 DIAGNOSIS — F329 Major depressive disorder, single episode, unspecified: Secondary | ICD-10-CM | POA: Insufficient documentation

## 2010-11-26 DIAGNOSIS — E119 Type 2 diabetes mellitus without complications: Secondary | ICD-10-CM | POA: Insufficient documentation

## 2010-12-28 LAB — URINALYSIS, ROUTINE W REFLEX MICROSCOPIC
Glucose, UA: 250 mg/dL — AB
Leukocytes, UA: NEGATIVE
Protein, ur: NEGATIVE mg/dL
Specific Gravity, Urine: >1.03 — ABNORMAL HIGH (ref 1.005–1.030)
pH: 6 (ref 5.0–8.0)

## 2010-12-28 LAB — COMPREHENSIVE METABOLIC PANEL
ALT: 20 U/L (ref 0–35)
AST: 24 U/L (ref 0–37)
Alkaline Phosphatase: 41 U/L (ref 39–117)
BUN: 11 mg/dL (ref 6–23)
Calcium: 9.1 mg/dL (ref 8.4–10.5)
Chloride: 102 mEq/L (ref 96–112)
Creatinine, Ser: 0.6 mg/dL (ref 0.4–1.2)
GFR calc Af Amer: >60 mL/min (ref >60)
Total Bilirubin: 0.4 mg/dL (ref 0.3–1.2)

## 2010-12-28 LAB — DIFFERENTIAL
Basophils Absolute: 0.1 10*3/uL (ref 0.0–0.1)
Eosinophils Absolute: 0.5 10*3/uL (ref 0.0–0.7)
Lymphocytes Relative: 30 % (ref 12–46)
Lymphs Abs: 4.1 10*3/uL — ABNORMAL HIGH (ref 0.7–4.0)
Monocytes Relative: 6 % (ref 3–12)
Neutro Abs: 8.2 10*3/uL — ABNORMAL HIGH (ref 1.7–7.7)

## 2010-12-28 LAB — POCT PREGNANCY, URINE: Preg Test, Ur: NEGATIVE

## 2010-12-28 LAB — CBC: RBC: 3.84 MIL/uL — ABNORMAL LOW (ref 3.87–5.11)

## 2010-12-28 LAB — URINE MICROSCOPIC-ADD ON

## 2010-12-29 LAB — URINALYSIS, ROUTINE W REFLEX MICROSCOPIC
Bilirubin Urine: NEGATIVE
Glucose, UA: NEGATIVE mg/dL
Hgb urine dipstick: NEGATIVE
Leukocytes, UA: NEGATIVE
Leukocytes, UA: NEGATIVE
Nitrite: NEGATIVE
Specific Gravity, Urine: >1.03 — ABNORMAL HIGH (ref 1.005–1.030)
Urobilinogen, UA: 0.2 mg/dL (ref 0.0–1.0)
pH: 6 (ref 5.0–8.0)
pH: 6 (ref 5.0–8.0)

## 2010-12-29 LAB — URINE MICROSCOPIC-ADD ON

## 2010-12-29 LAB — POCT I-STAT, CHEM 8
BUN: 8 mg/dL (ref 6–23)
Chloride: 105 mEq/L (ref 96–112)
Hemoglobin: 13.3 g/dL (ref 12.0–15.0)
Potassium: 3.8 mEq/L (ref 3.5–5.1)
Sodium: 137 mEq/L (ref 135–145)
TCO2: 22 mmol/L (ref 0–100)

## 2010-12-29 LAB — CBC
Hemoglobin: 12.5 g/dL (ref 12.0–15.0)
MCHC: 34.2 g/dL (ref 30.0–36.0)
MCV: 95.6 fL (ref 78.0–100.0)
WBC: 12.1 10*3/uL — ABNORMAL HIGH (ref 4.0–10.5)

## 2010-12-29 LAB — DIFFERENTIAL
Eosinophils Absolute: 0.4 10*3/uL (ref 0.0–0.7)
Eosinophils Relative: 3 % (ref 0–5)
Monocytes Absolute: 0.8 10*3/uL (ref 0.1–1.0)
Monocytes Relative: 7 % (ref 3–12)

## 2010-12-29 LAB — PREGNANCY, URINE: Preg Test, Ur: NEGATIVE

## 2010-12-30 LAB — WET PREP, GENITAL: Yeast Wet Prep HPF POC: NONE SEEN

## 2010-12-30 LAB — URINALYSIS, ROUTINE W REFLEX MICROSCOPIC
Bilirubin Urine: NEGATIVE
Hgb urine dipstick: NEGATIVE
Ketones, ur: NEGATIVE mg/dL
Nitrite: NEGATIVE
pH: 5.5 (ref 5.0–8.0)

## 2010-12-30 LAB — RAPID STREP SCREEN (MED CTR MEBANE ONLY): Streptococcus, Group A Screen (Direct): NEGATIVE

## 2010-12-30 LAB — GC/CHLAMYDIA PROBE AMP, GENITAL: Chlamydia, DNA Probe: NEGATIVE

## 2011-02-22 ENCOUNTER — Emergency Department (HOSPITAL_COMMUNITY)
Admission: EM | Admit: 2011-02-22 | Discharge: 2011-02-23 | Disposition: A | Discharge status: Home / Self Care | Payer: Self-pay | Attending: Emergency Medicine | Admitting: Emergency Medicine

## 2011-02-22 DIAGNOSIS — F3289 Other specified depressive episodes: Secondary | ICD-10-CM | POA: Insufficient documentation

## 2011-02-22 DIAGNOSIS — F329 Major depressive disorder, single episode, unspecified: Secondary | ICD-10-CM | POA: Insufficient documentation

## 2011-02-22 DIAGNOSIS — G43909 Migraine, unspecified, not intractable, without status migrainosus: Secondary | ICD-10-CM | POA: Insufficient documentation

## 2011-02-22 DIAGNOSIS — I10 Essential (primary) hypertension: Secondary | ICD-10-CM | POA: Insufficient documentation

## 2011-02-22 DIAGNOSIS — Z79899 Other long term (current) drug therapy: Secondary | ICD-10-CM | POA: Insufficient documentation

## 2011-02-22 DIAGNOSIS — G40802 Other epilepsy, not intractable, without status epilepticus: Secondary | ICD-10-CM | POA: Insufficient documentation

## 2011-02-22 DIAGNOSIS — E119 Type 2 diabetes mellitus without complications: Secondary | ICD-10-CM | POA: Insufficient documentation

## 2011-02-22 DIAGNOSIS — M79609 Pain in unspecified limb: Secondary | ICD-10-CM | POA: Insufficient documentation

## 2011-02-22 DIAGNOSIS — G2581 Restless legs syndrome: Secondary | ICD-10-CM | POA: Insufficient documentation

## 2011-02-23 ENCOUNTER — Emergency Department (HOSPITAL_COMMUNITY): Payer: Self-pay

## 2011-02-23 ENCOUNTER — Other Ambulatory Visit (HOSPITAL_COMMUNITY): Payer: Self-pay

## 2011-02-23 IMAGING — CR DG TIBIA/FIBULA 2V*L*
4 series · 4 of 4 positions shown · non-contrast
Comparison: None.

CLINICAL DATA: Leg pain every morning

LEFT TIBIA AND FIBULA - 2 VIEW

[view not recorded (1 of 4)]
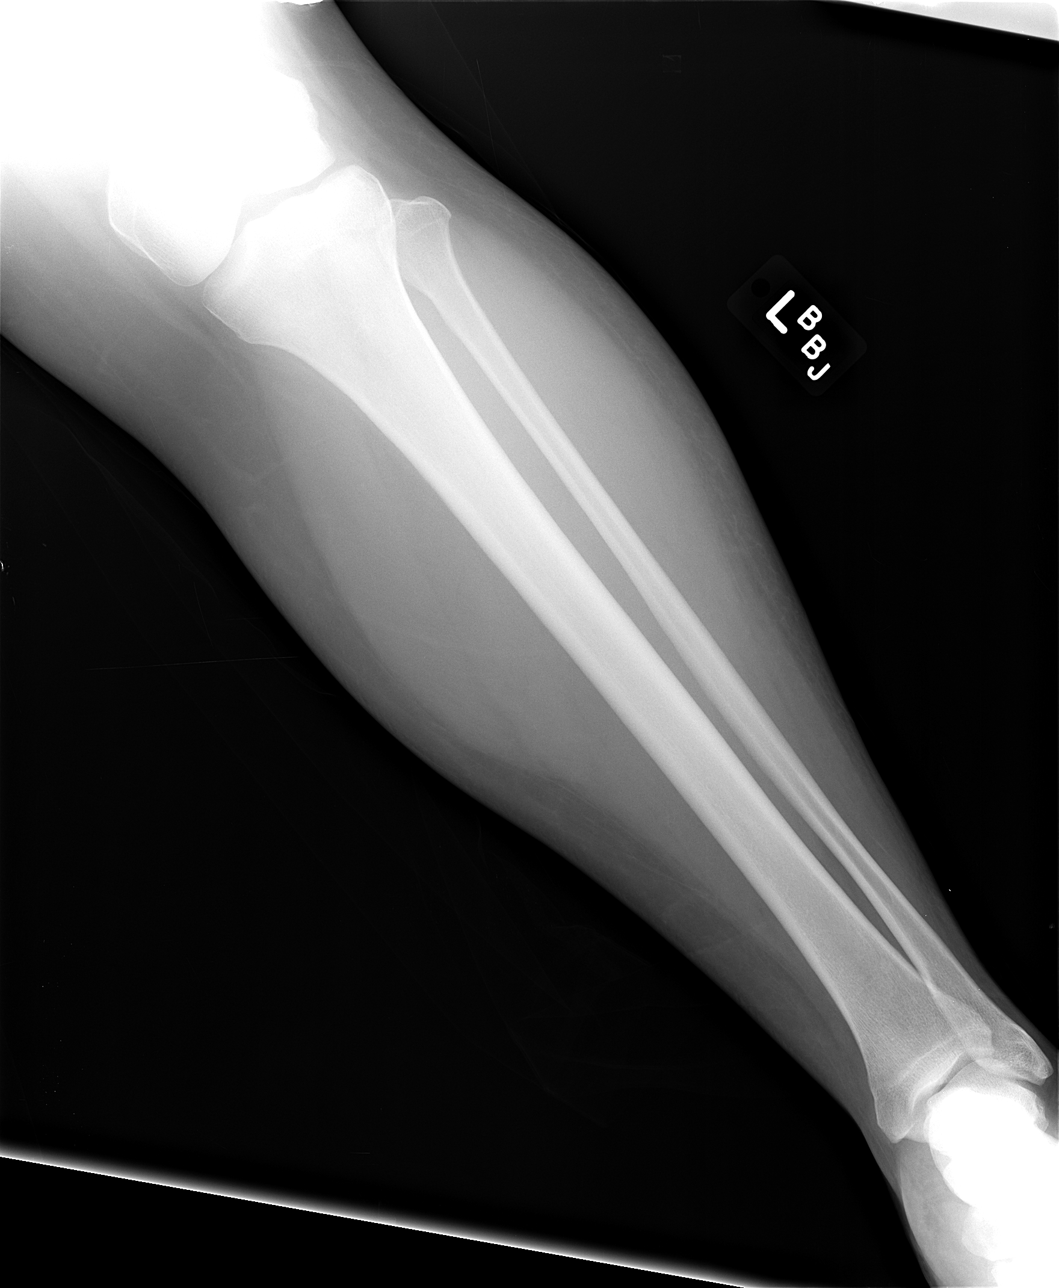

[view not recorded (2 of 4)]
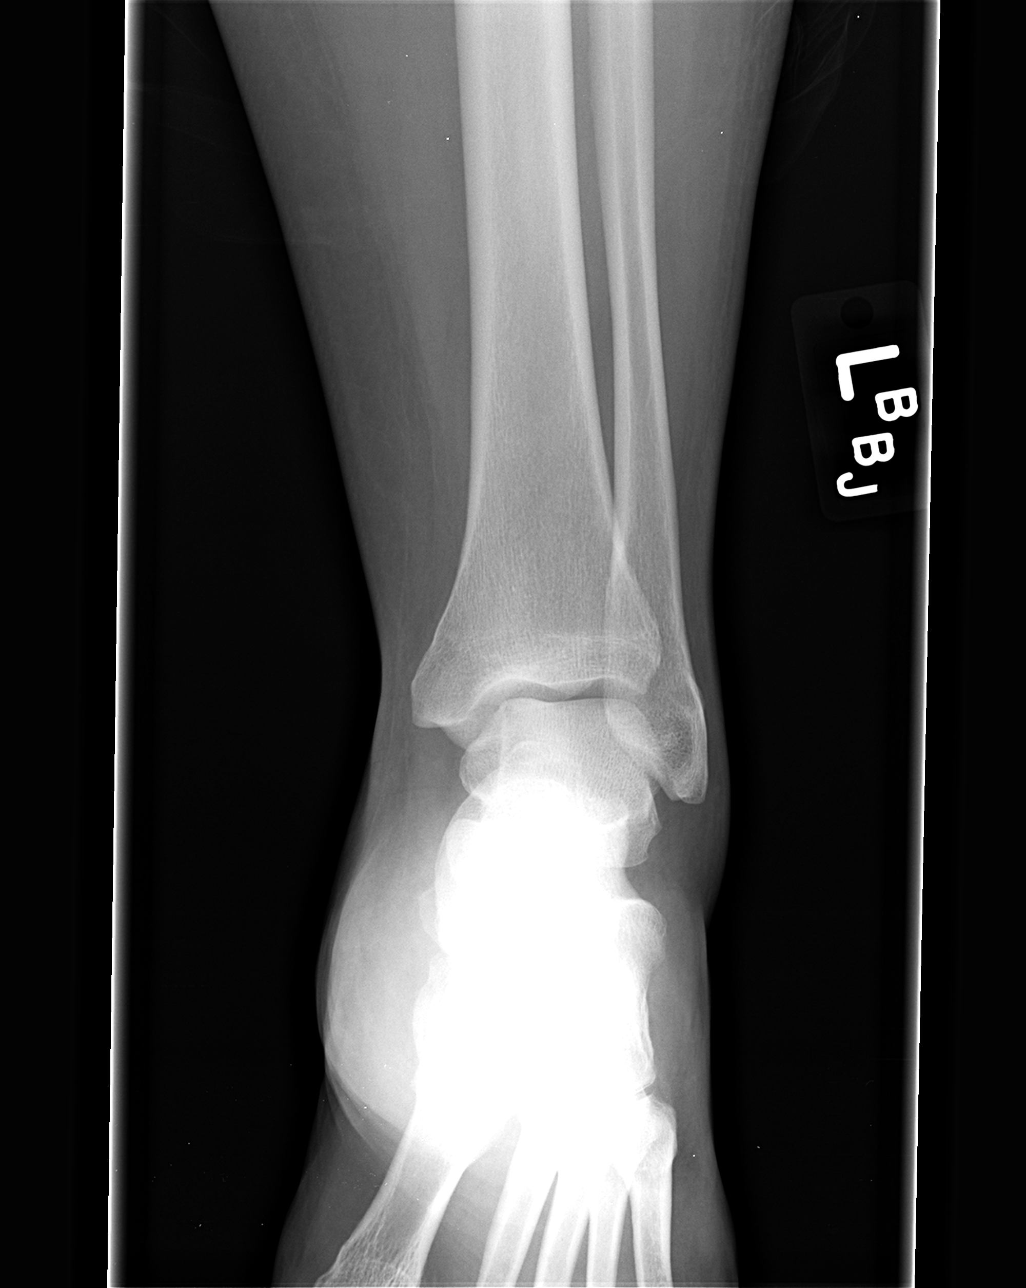

[view not recorded (3 of 4)]
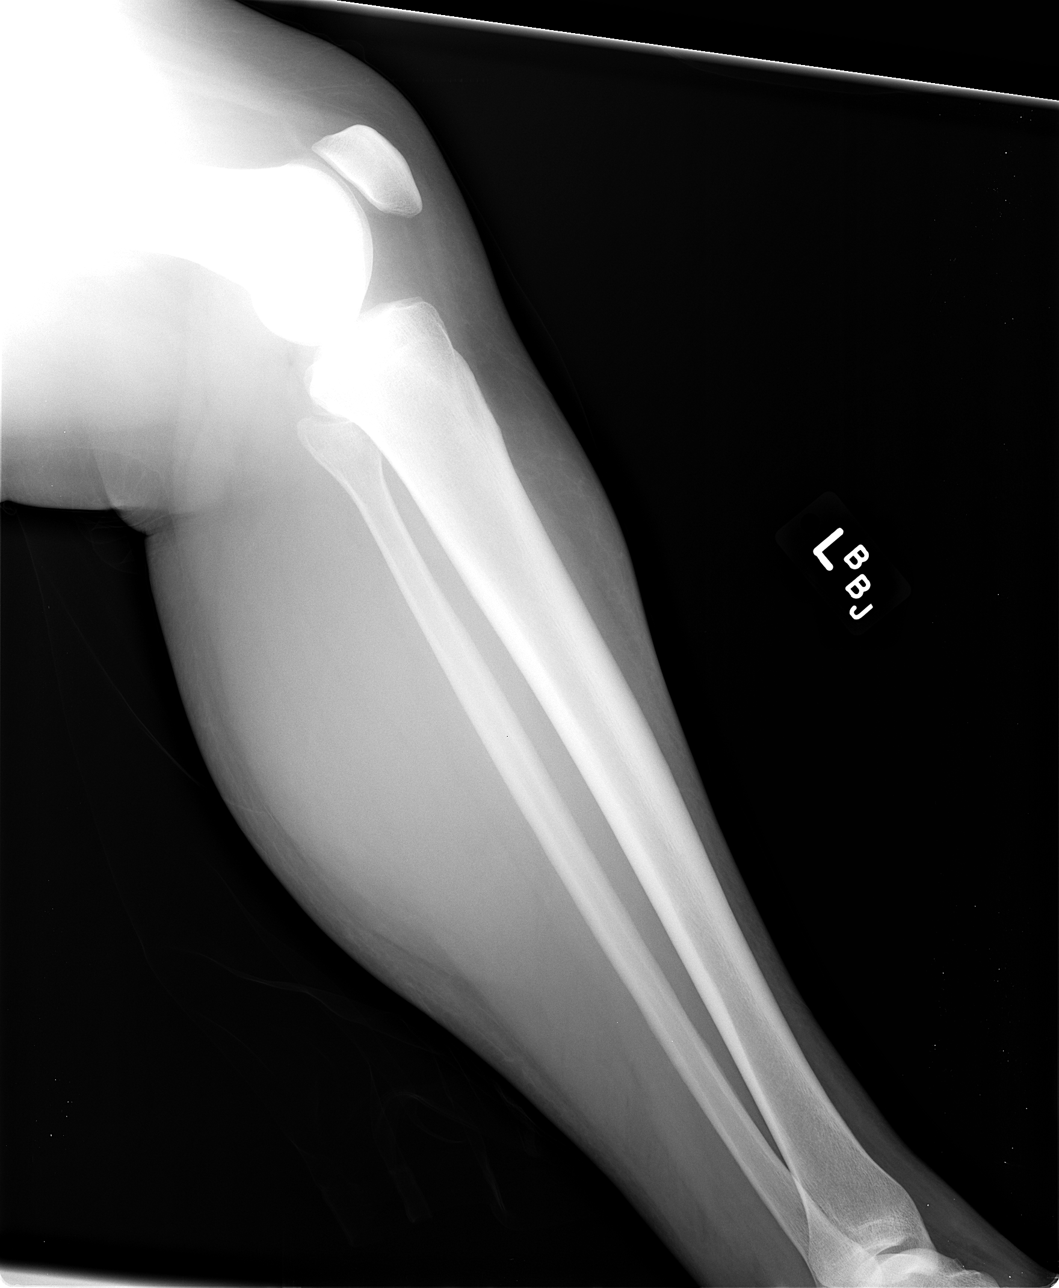

[view not recorded (4 of 4)]
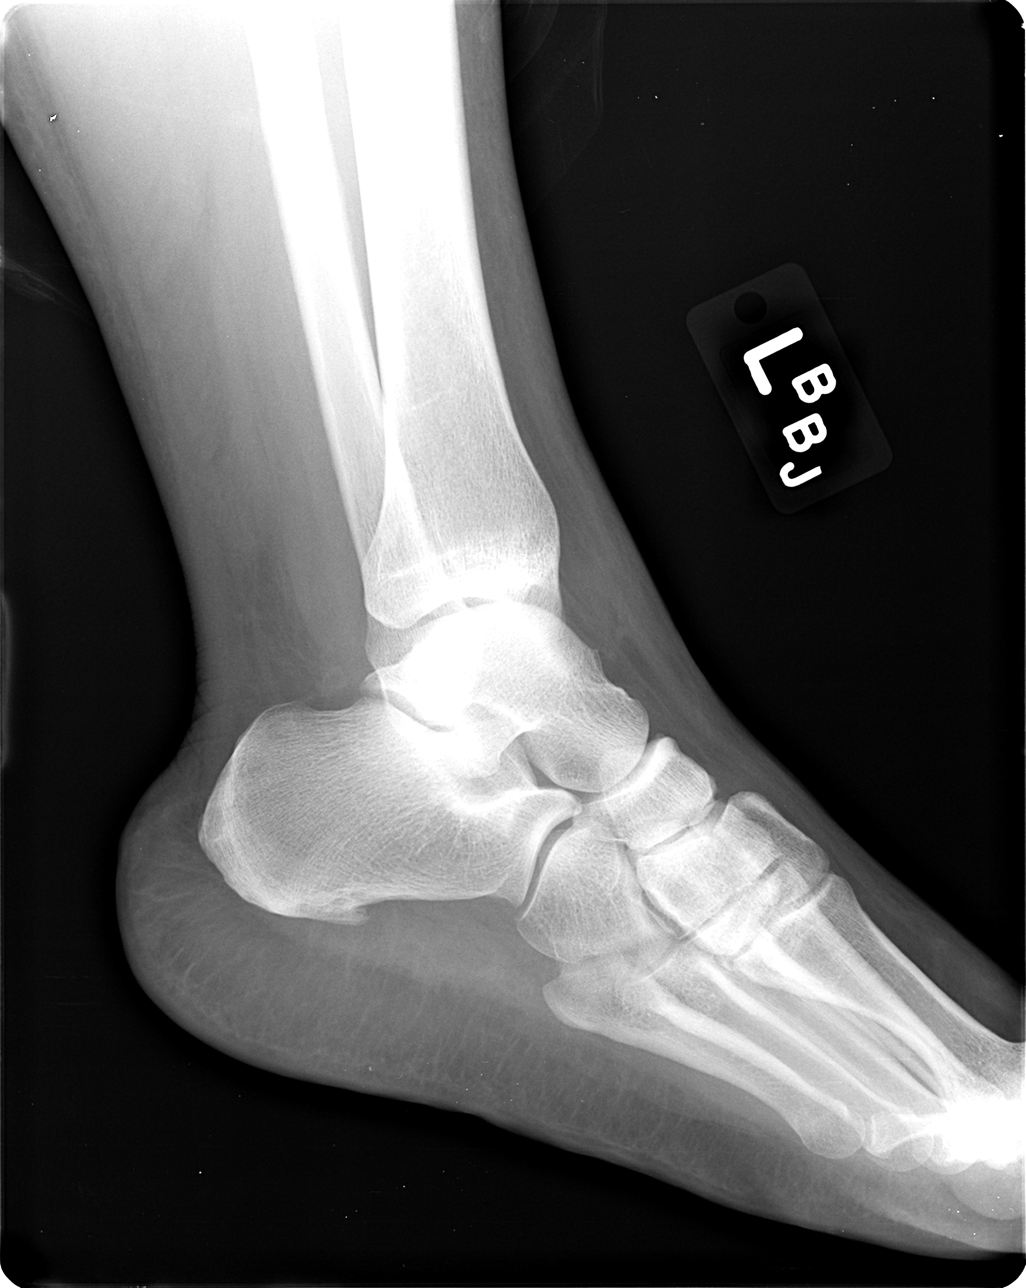

[4 of 4 positions shown; findings below may reference images not displayed]

FINDINGS: No evidence of acute fracture or subluxation.  No focal
bone lesions.  Bone matrix and cortex appear intact.  No abnormal
radiopaque densities in the soft tissues.
IMPRESSION: No acute bony abnormalities identified.

## 2011-02-27 ENCOUNTER — Emergency Department (HOSPITAL_COMMUNITY)
Admission: EM | Admit: 2011-02-27 | Discharge: 2011-02-28 | Disposition: A | Discharge status: Home / Self Care | Payer: Self-pay | Attending: Emergency Medicine | Admitting: Emergency Medicine

## 2011-02-27 DIAGNOSIS — G43909 Migraine, unspecified, not intractable, without status migrainosus: Secondary | ICD-10-CM | POA: Insufficient documentation

## 2011-02-27 DIAGNOSIS — E119 Type 2 diabetes mellitus without complications: Secondary | ICD-10-CM | POA: Insufficient documentation

## 2011-02-27 DIAGNOSIS — G2581 Restless legs syndrome: Secondary | ICD-10-CM | POA: Insufficient documentation

## 2011-02-27 DIAGNOSIS — I10 Essential (primary) hypertension: Secondary | ICD-10-CM | POA: Insufficient documentation

## 2011-03-04 NOTE — H&P (Signed)
NAME:  Beverly Tran, Beverly Tran                     ACCOUNT NO.:  [id number]   MEDICAL RECORD NO.:  [id number]                   PATIENT TYPE:  EMS   LOCATION:  ED                                   FACILITY:  [facility]   PHYSICIAN:  Hanley Hays. Dechurch, M.D.           DATE OF BIRTH:  Feb 19, 1971   DATE OF ADMISSION:  09/19/2003  DATE OF DISCHARGE:                                HISTORY & PHYSICAL   HISTORY OF PRESENT ILLNESS:  A 40 year old Caucasian female, known chronic  abuser of benzodiazepines and opiates, who presented to the emergency room  after being found at home unresponsive in respiratory arrest who was unable  to be intubated at the scene who was bagged until presentation.  She  apparently had vomited.  She was given Narcan without response though she  did become agitated, requiring paralysis for intubation.  The patient also  received charcoal through the tube.  The patient was intubated successfully.  She has remained hemodynamically stable.  She is being admitted to the  hospital for further care and evaluation.  The patient obviously is unable  to provide any significant history.  According to family members she was  last seen about 2 a.m. in her usual state of health who was alert and  apparently not intoxicated.  She had not been drinking any alcohol according  to the family.  She had no evidence of trauma.  Interestingly, her mother  was also found in the same house in the same condition.   PAST MEDICAL HISTORY:  Remarkable for gastroesophageal reflux, history of  asthma, history of depression and chronic anxiety with neuroses as  documented, history of peptic ulcer disease, chronic migraine headaches,  history of nephrolithiasis, status post tonsillectomy, C-section,  cholecystectomy.  Apparently uses an inhaler, Xanax, and a PPI at the time  but no specifics are available.   FAMILY MEDICAL HISTORY:  Noncontributory except for the fact that her mother  is a chronic drug  abuser as well and who was in the next bed.   SOCIAL HISTORY:  Lives with mother and some other extended family.  She  apparently abuses tobacco, though to the extent is unknown.  Three children.  She is unemployed.  She reportedly does not use alcohol.   PHYSICAL EXAMINATION:  GENERAL:  An obese Caucasian female, well-developed,  well-nourished, who opens her eyes to stimulation.  She follows no commands.  HEENT:  Pupils are 2 mm and not reactive.  Conjunctivae are mildly injected.  Oropharynx:  The patient is intubated and because of charcoal and other  mucous it is difficult to examine.  NECK:  Supple.  There is no JVD or adenopathy, no thyromegaly.  SKIN:  Unremarkable for any lesions or evidence of trauma.  CHEST:  Lungs reveal coarse rhonchi throughout.  Heart is diminished.  Equal  breath sounds.  VITAL SIGNS:  The blood pressure was 117/60, pulse oximeter is 100%, heart  rate is  94, sinus rhythm, respirations at 17, ventilator is at 12.  HEART:  Regular but obscured by lung noise.  ABDOMEN:  Soft, obese, nontender.  She has multiple striae.  NEUROLOGIC:  She does move her extremities to stimulation though not on  command.  Quite limited secondary to intubation.   ASSESSMENT AND PLAN:  Respiratory arrest probably secondary to drug overdose  in a patient who is apparently taking at least opiates and benzodiazepines  per the toxicology screen, who apparently was reasonably alert at 2 a.m.  She is being admitted for further evaluation and treatment, extubation, and  follow-up as indicated.     ___________________________________________                                         Hanley Hays. Josefine Class, M.D.   FED/MEDQ  D:  09/19/2003  T:  09/19/2003  Job:  [id number]

## 2011-03-04 NOTE — Op Note (Signed)
NAMEHOLDEN, DRAUGHON                       ACCOUNT NO.:  [id number]   MEDICAL RECORD NO.:  [id number]                   PATIENT TYPE:  AMB   LOCATION:  DAY                                  FACILITY:  [facility]   PHYSICIAN:  Gerrit Friends. Rourk, M.D.               DATE OF BIRTH:  06/03/1971   DATE OF PROCEDURE:  DATE OF DISCHARGE:  05/14/2002                                 OPERATIVE REPORT   PROCEDURE:  Esophagogastroduodenoscopy with small bowel biopsy followed by  diagnostic colonoscopy (stool sampling).   INDICATIONS FOR PROCEDURE:  The patient is a 40 year old lady with chronic  incessant diarrhea.  She has a nocturnal component.  EGD and colonoscopy are  now being done.  Small bowel biopsy will be obtained.  Colonoscopy is now  being done to rule out inflammatory bowel disease.  This approach has been  discussed with Ms. Harb previously.  Potential risks, benefits, and  alternatives have been reviewed, questions answered.  She is agreeable.  She  is at low risk for conscious sedation.  Please see my dictated H&P of  05/07/02 for more information.   DESCRIPTION OF PROCEDURE:  O2 saturation, blood pressure, pulse, and  respirations were monitored throughout the entire procedure.   Conscious sedation:  Versed 8 mg IV, Demerol 175 mg IV in divided doses,  Cetacaine spray for topical oropharyngeal anesthesia.   Instrument:  Olympus video chip gastroscope and diagnostic colonoscope.   EGD findings:  Examination of the tubular esophagus revealed no mucosal  abnormality.  The EG junction was easily traversed, entering the stomach.  The gastric cavity insufflated well with air.  A thorough examination of the  gastric mucosa, including a retroflexed view of the proximal stomach and  esophagogastric junction, demonstrated no abnormalities.  Pylorus was  patent, easily traversed.   Duodenum:  The bulb, second portion, and third portion appeared normal.   Therapy/diagnostic maneuvers  performed:  Multiple biopsies were obtained of  D2 and D3 were taken to further evaluate the possibility of celiac disease.   The patient tolerated the procedure well, and she was prepared for  colonoscopy.  Digital rectal examination revealed no abnormality.   Endoscopic findings:  The prep was marginal.  Rectum:  Examination of the  rectal mucosa, including the retroflexed view of the verge, revealed no  abnormalities aside from a thin coating of stool.  With this washed away,  the mucosa appears normal.   Colon:  The colonic mucosa was surveyed from the rectosigmoid junction  through the left, transverse, and right colon to the area of the appendiceal  orifice and ileocecal valve and the cecum.  A thin coating of thick, viscous  stool on most of the colon to the cecum.  Quite a bit of washing had to be  done.  The mucosa to the cecum appeared normal.  The terminal ileum was  intubated to 10 cm, and this segment of  the GI tract also appeared normal.  Please see photos.  From this level the scope was slowly and cautiously  withdrawn.  All previously-viewed mucosal surfaces were again seen, and  there was no evidence of colitis or other abnormality.  A stool sample was  taken for microbiology studies.  The patient tolerated the procedure well,  was reactivated in endoscopy.   IMPRESSION:  EGD findings:  Normal esophagus, stomach, D1, D2, D3, status  post small bowel biopsy.   Colonoscopy findings:  1. Poor prep.  2. Grossly normal rectum, colon, and terminal ileum.  3. Stool sample taken.   RECOMMENDATIONS:  1. Follow up on pathology and stool studies.  2. Further recommendations to follow.                                               Gerrit Friends. Rourk, M.D.    RMR/MEDQ  D:  05/14/2002  T:  05/16/2002  Job:  [id number]   cc:   Elliot Cousin, M.D.

## 2011-03-04 NOTE — Discharge Summary (Signed)
NAME:  Beverly Tran, Beverly Tran                     ACCOUNT NO.:  [id number]   MEDICAL RECORD NO.:  [id number]                   PATIENT TYPE:  INP   LOCATION:  IC03                                 FACILITY:  [facility]   PHYSICIAN:  Hanley Hays. Dechurch, M.D.           DATE OF BIRTH:  10/22/1970   DATE OF ADMISSION:  09/19/2003  DATE OF DISCHARGE:  09/21/2003                                 DISCHARGE SUMMARY   DISCHARGE DIAGNOSES:  1. Acute respiratory failure probably secondary to narcotic overdose.  2. Probable aspiration.  3. Obesity.  4. History of depression.  5. Chronic anxiety disorder with neuroses.  6. History of peptic ulcer disease.  7. Chronic migraine headaches.  8. History of nephrolithiasis.  9. History of asthma.   HOSPITAL COURSE:  A 40 year old, Caucasian female, known chronic abuser of  benzodiazepines and opiates was brought to the emergency room by EMS after  being found at home unresponsive in respiratory arrest and was unable to be  intubated at the scene.  She was intubated in the emergency room and over  the course of the next 24 hours returned to her baseline mental status and  was successfully intubated.  She did have a perihilar infiltrate.  She was  maintained in the ICU.  On the day of December 5, the patient was at  baseline and normal mental status.  She does not recall any events that may  have led up to her respiratory arrest, etc.  However, later that evening,  she stated that she was leaving the hospital.  The physician on call was  unable to convince her to stay.  She was informed of the risk of leaving  against medical advice given her aspiration, etc.  She chose to take this  risk and left at 1930 hours on September 21, 2003.     ___________________________________________                                         Hanley Hays. Josefine Class, M.D.   FED/MEDQ  D:  10/16/2003  T:  10/16/2003  Job:  [id number]

## 2011-07-27 LAB — URINALYSIS, ROUTINE W REFLEX MICROSCOPIC
Bilirubin Urine: NEGATIVE
Glucose, UA: NEGATIVE
Ketones, ur: NEGATIVE
Urobilinogen, UA: 0.2

## 2011-07-27 LAB — URINE MICROSCOPIC-ADD ON

## 2011-08-01 LAB — CBC
HCT: 40.1
Hemoglobin: 13.9
MCHC: 34.8
MCV: 95.3
Platelets: 302
RDW: 12.6

## 2011-08-01 LAB — BASIC METABOLIC PANEL
BUN: 7
CO2: 25
Chloride: 105
Glucose, Bld: 92
Potassium: 3.4 — ABNORMAL LOW
Sodium: 138

## 2011-08-01 LAB — PREGNANCY, URINE: Preg Test, Ur: NEGATIVE

## 2011-08-01 LAB — URINALYSIS, ROUTINE W REFLEX MICROSCOPIC
Bilirubin Urine: NEGATIVE
Hgb urine dipstick: NEGATIVE
Ketones, ur: NEGATIVE
Nitrite: NEGATIVE
Specific Gravity, Urine: 1.015
Urobilinogen, UA: 0.2

## 2011-08-01 LAB — DIFFERENTIAL
Basophils Absolute: 0
Eosinophils Absolute: 0.1
Eosinophils Relative: 1
Lymphocytes Relative: 31
Monocytes Absolute: 0.5

## 2012-03-02 ENCOUNTER — Encounter (HOSPITAL_COMMUNITY): Payer: Self-pay | Admitting: *Deleted

## 2012-03-02 ENCOUNTER — Emergency Department (HOSPITAL_COMMUNITY)
Admission: EM | Admit: 2012-03-02 | Discharge: 2012-03-02 | Disposition: A | Discharge status: Home / Self Care | Payer: Self-pay | Attending: Emergency Medicine | Admitting: Emergency Medicine

## 2012-03-02 DIAGNOSIS — G8929 Other chronic pain: Secondary | ICD-10-CM

## 2012-03-02 DIAGNOSIS — M545 Low back pain, unspecified: Secondary | ICD-10-CM

## 2012-03-02 DIAGNOSIS — G43909 Migraine, unspecified, not intractable, without status migrainosus: Secondary | ICD-10-CM

## 2012-03-02 DIAGNOSIS — I10 Essential (primary) hypertension: Secondary | ICD-10-CM | POA: Insufficient documentation

## 2012-03-02 HISTORY — DX: Dorsalgia, unspecified: M54.9

## 2012-03-02 HISTORY — DX: Anxiety disorder, unspecified: F41.9

## 2012-03-02 HISTORY — DX: Major depressive disorder, single episode, unspecified: F32.9

## 2012-03-02 HISTORY — DX: Depression, unspecified: F32.A

## 2012-03-02 HISTORY — DX: Essential (primary) hypertension: I10

## 2012-03-02 HISTORY — DX: Pure hypercholesterolemia, unspecified: E78.00

## 2012-03-02 HISTORY — DX: Other chronic pain: G89.29

## 2012-03-02 MED ORDER — HYDROMORPHONE HCL PF 1 MG/ML IJ SOLN
1.0000 mg | Freq: Once | INTRAMUSCULAR | Status: AC
  Administered 2012-03-02: 1 mg via INTRAMUSCULAR
  Filled 2012-03-02: qty 1

## 2012-03-02 MED ORDER — CYCLOBENZAPRINE HCL 10 MG PO TABS: 10.0000 mg | ORAL_TABLET | Freq: Three times a day (TID) | ORAL | Status: AC | PRN

## 2012-03-02 MED ORDER — HYDROCODONE-ACETAMINOPHEN 5-325 MG PO TABS: ORAL_TABLET | ORAL | Status: AC

## 2012-03-02 NOTE — ED Notes (Signed)
Pt presents with migraine headache since 2 this afternoon per pt. Pt denies vision change, denies n/v at this time. Pt is alert sitting in room with all lights on, watching TV. Pt reports has appointment with pain management clinic on 5 th of June. History of migraine headaches since age of 28 per pt. Tylenol taken without relief.

## 2012-03-02 NOTE — ED Notes (Signed)
States that she mopped floor today and now has lower back pain, migraine HA since 1700 and nausea, denies vomitting

## 2012-03-02 NOTE — Discharge Instructions (Signed)
RESOURCE GUIDE  Dental Problems  Patients with Medicaid: [facility]                     [facility] [address]                                           [address] Phone:  [phone]                                                  Phone:  [phone]  If unable to pay or uninsured, contact:  [facility] or [facility] to become qualified for the adult dental clinic.  Chronic Pain Problems Contact [facility]  [phone] Patients need to be referred by their primary care doctor.  Insufficient Money for Medicine Contact [employer]:  call "211" or [facility] 271-5999.  No Primary Care Doctor Call [facility]  832-8000 Other agencies that provide inexpensive medical care    [facility]  [phone]    [facility]  [phone]    [facility]  [phone]    Women's Clinic  [phone]    [facility]  [phone]    [facility]  [phone]  Psychological Services [facility]  [phone] [facility]  [phone] [facility]   [phone] (emergency services 641-4993)  Substance Abuse Resources [facility]  336-882-2125 [facility] 336-784-9470 [facility] 336-285-9073 [facility] 336-845-3988 Residential & Outpatient Substance Abuse Program  800-659-3381  Abuse/Neglect [place] Child Abuse Hotline (336) 641-3795 [place] Child Abuse Hotline 800-378-5315 (After Hours)  Emergency Shelter [facility] (336) 271-5985  M[facility] (336) 275-9566 [facility] (704) 372-4663  MRSA Hotline #:   832-7006    [place] [facility]  [facility] [place]     [employer]                          [facility] [address] [place]                       [address]      [address]  [place]                                                [place]                            [place] Phone:  [phone]                                   Phone:  [phone]                 Phone:  [phone]  [facility] Phone:  [phone]    [facility] Child Abuse Hotline (567)775-1729 (619)816-5419 (After Hours)   Take the prescriptions as directed.  Apply moist heat or ice to the area(s) of discomfort, for 15 minutes at a time, several times per day for the next few days.  Do not fall asleep on a heating or ice pack.  Call your regular medical doctor on Monday to schedule a follow up appointment this week.  Return to the Emergency Department immediately if worsening.

## 2012-03-02 NOTE — ED Provider Notes (Signed)
History     CSN: [id number]  Arrival date & time 03/02/12  2105   First MD Initiated Contact with Patient 03/02/12 2144      Chief Complaint  Patient presents with  . Back Pain  . Migraine    HPI Pt was seen at 2145.  Per pt, c/o gradual onset and persistence of constant acute flair of her chronic low back "pain" that began earlier today after she was mopping a floor.  Denies any change in her usual chronic pain pattern.  Denies incont/retention of bowel or bladder, no saddle anesthesia, no focal motor weakness, no tingling/numbness in extremities, no fevers, no injury.   The symptoms have been associated with gradual onset and persistence of constant acute flair of her chronic migraine headache for the past week. Describes the headache as per her usual chronic migraine headache pain pattern for many years.  Denies headache was sudden or maximal in onset or at any time.  Denies visual changes, no focal motor weakness, no tingling/numbness in extremities, no fevers, no neck pain, no rash.  The patient has a significant history of similar symptoms previously, recently being evaluated for this complaint and multiple prior evals for same.     Past Medical History  Diagnosis Date  . Migraine   . Back pain, chronic   . Hypertension   . High cholesterol   . Depression   . Anxiety   . Asthma     Past Surgical History  Procedure Date  . Cesarean section   . Tubal ligation     History  Substance Use Topics  . Smoking status: Former Games developer  . Smokeless tobacco: Not on file  . Alcohol Use: No    Review of Systems ROS: Statement: All systems negative except as marked or noted in the HPI; Constitutional: Negative for fever and chills. ; ; Eyes: Negative for eye pain, redness and discharge. ; ; ENMT: Negative for ear pain, hoarseness, nasal congestion, sinus pressure and sore throat. ; ; Cardiovascular: Negative for chest pain, palpitations, diaphoresis, dyspnea and peripheral edema. ; ;  Respiratory: Negative for cough, wheezing and stridor. ; ; Gastrointestinal: Negative for nausea, vomiting, diarrhea, abdominal pain, blood in stool, hematemesis, jaundice and rectal bleeding. . ; ; Genitourinary: Negative for dysuria, flank pain and hematuria. ; ; Musculoskeletal: +LBP.  Negative for neck pain. Negative for swelling and trauma.; ; Skin: Negative for pruritus, rash, abrasions, blisters, bruising and skin lesion.; ; Neuro: +headache. Negative for lightheadedness and neck stiffness. Negative for weakness, altered level of consciousness , altered mental status, extremity weakness, paresthesias, involuntary movement, seizure and syncope.       Allergies  Aspirin; Compazine; Imitrex; Nubain; Thorazine; and Voltaren  Home Medications  No current outpatient prescriptions on file.  BP 104/78  Pulse 102  Temp(Src) 98.4 F (36.9 C) (Oral)  Resp 17  Ht 5\' 5"  (1.651 m)  Wt 198 lb (89.812 kg)  BMI 32.95 kg/m2  SpO2 99%  LMP 02/06/2012  Physical Exam 2150: Physical examination:  Nursing notes reviewed; Vital signs and O2 SAT reviewed;  Constitutional: Well developed, Well nourished, Well hydrated, In no acute distress; Head:  Normocephalic, atraumatic; Eyes: EOMI, PERRL, No scleral icterus; ENMT: Mouth and pharynx normal, Mucous membranes moist; Neck: Supple, Full range of motion, No lymphadenopathy; Cardiovascular: Regular rate and rhythm, No murmur or gallop; Respiratory: Breath sounds clear & equal bilaterally, No rales, rhonchi, wheezes, Normal respiratory effort/excursion; Chest: Nontender, Movement normal; Abdomen: Soft, Nontender, Nondistended, Normal bowel sounds;  Genitourinary: No CVA tenderness; Spine:  No midline CS, TS, LS tenderness.  +TTP bilat lumbar paraspinal muscles.;  Extremities: Pulses normal, No tenderness, No edema, No calf edema or asymmetry.; Neuro: AA&Ox3, Major CN grossly intact.  Strength 5/5 equal bilat UE's and LE's, including great toe dorsiflexion.  DTR 2/4  equal bilat UE's and LE's.  No gross sensory deficits.  Neg straight leg raises bilat. Gait steady;.; Skin: Color normal, Warm, Dry, no rash.    ED Course  Procedures    MDM  MDM Reviewed: nursing note, previous chart and vitals      9:57 PM:  Long hx of chronic pain.  Will tx symptomatically at this time.         Laray Anger, DO 03/04/12 2318

## 2012-10-24 ENCOUNTER — Emergency Department (HOSPITAL_COMMUNITY)
Admission: EM | Admit: 2012-10-24 | Discharge: 2012-10-24 | Disposition: A | Discharge status: Home / Self Care | Payer: Self-pay | Attending: Emergency Medicine | Admitting: Emergency Medicine

## 2012-10-24 ENCOUNTER — Emergency Department (HOSPITAL_COMMUNITY): Payer: Self-pay

## 2012-10-24 ENCOUNTER — Encounter (HOSPITAL_COMMUNITY): Payer: Self-pay | Admitting: *Deleted

## 2012-10-24 DIAGNOSIS — W010XXA Fall on same level from slipping, tripping and stumbling without subsequent striking against object, initial encounter: Secondary | ICD-10-CM | POA: Insufficient documentation

## 2012-10-24 DIAGNOSIS — I10 Essential (primary) hypertension: Secondary | ICD-10-CM | POA: Insufficient documentation

## 2012-10-24 DIAGNOSIS — Z87891 Personal history of nicotine dependence: Secondary | ICD-10-CM | POA: Insufficient documentation

## 2012-10-24 DIAGNOSIS — G8929 Other chronic pain: Secondary | ICD-10-CM | POA: Insufficient documentation

## 2012-10-24 DIAGNOSIS — S8990XA Unspecified injury of unspecified lower leg, initial encounter: Secondary | ICD-10-CM | POA: Insufficient documentation

## 2012-10-24 DIAGNOSIS — Z8659 Personal history of other mental and behavioral disorders: Secondary | ICD-10-CM | POA: Insufficient documentation

## 2012-10-24 DIAGNOSIS — Y9301 Activity, walking, marching and hiking: Secondary | ICD-10-CM | POA: Insufficient documentation

## 2012-10-24 DIAGNOSIS — Z79899 Other long term (current) drug therapy: Secondary | ICD-10-CM | POA: Insufficient documentation

## 2012-10-24 DIAGNOSIS — S93409A Sprain of unspecified ligament of unspecified ankle, initial encounter: Secondary | ICD-10-CM | POA: Insufficient documentation

## 2012-10-24 DIAGNOSIS — M549 Dorsalgia, unspecified: Secondary | ICD-10-CM | POA: Insufficient documentation

## 2012-10-24 DIAGNOSIS — E7800 Pure hypercholesterolemia, unspecified: Secondary | ICD-10-CM | POA: Insufficient documentation

## 2012-10-24 DIAGNOSIS — R51 Headache: Secondary | ICD-10-CM | POA: Insufficient documentation

## 2012-10-24 DIAGNOSIS — Y929 Unspecified place or not applicable: Secondary | ICD-10-CM | POA: Insufficient documentation

## 2012-10-24 DIAGNOSIS — S93401A Sprain of unspecified ligament of right ankle, initial encounter: Secondary | ICD-10-CM

## 2012-10-24 DIAGNOSIS — Z8679 Personal history of other diseases of the circulatory system: Secondary | ICD-10-CM | POA: Insufficient documentation

## 2012-10-24 DIAGNOSIS — J45909 Unspecified asthma, uncomplicated: Secondary | ICD-10-CM | POA: Insufficient documentation

## 2012-10-24 IMAGING — CR DG ANKLE COMPLETE 3+V*R*
3 series · 3 of 3 positions shown · non-contrast
Comparison: None.

CLINICAL DATA: Fell and twisted ankle, pain

RIGHT ANKLE - COMPLETE 3+ VIEW

[view not recorded (1 of 3)]
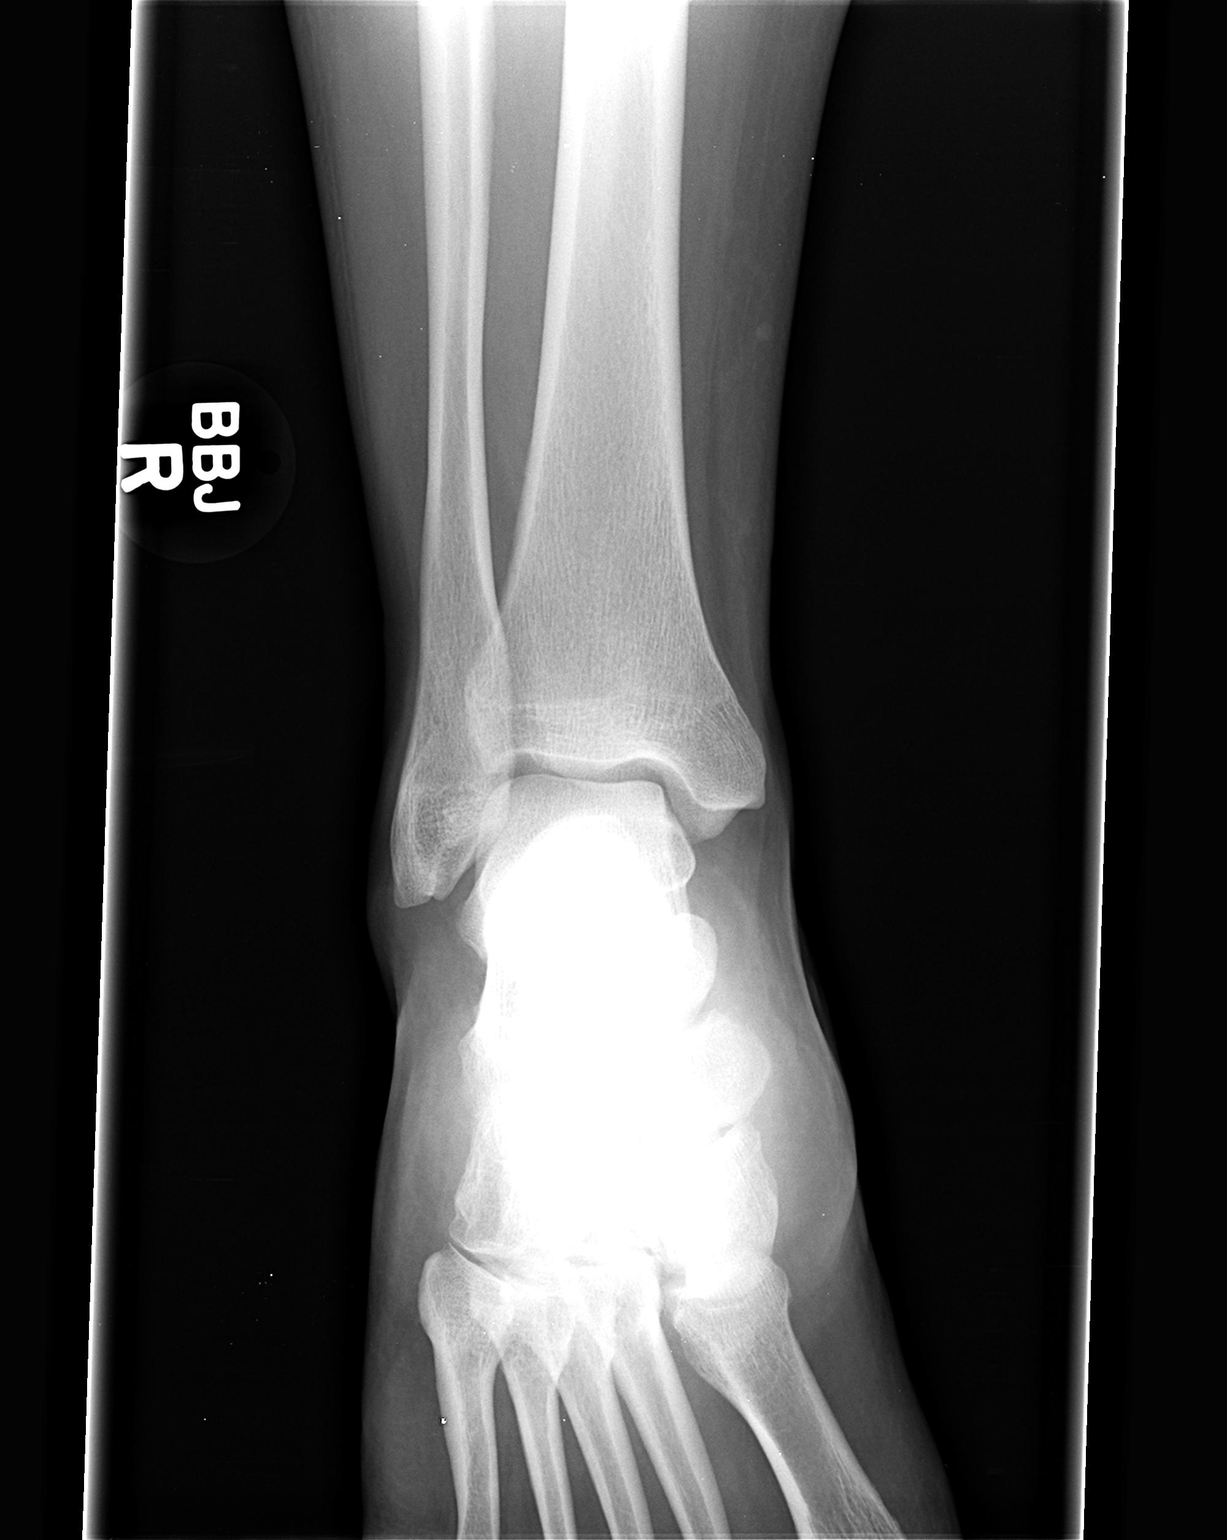

[view not recorded (2 of 3)]
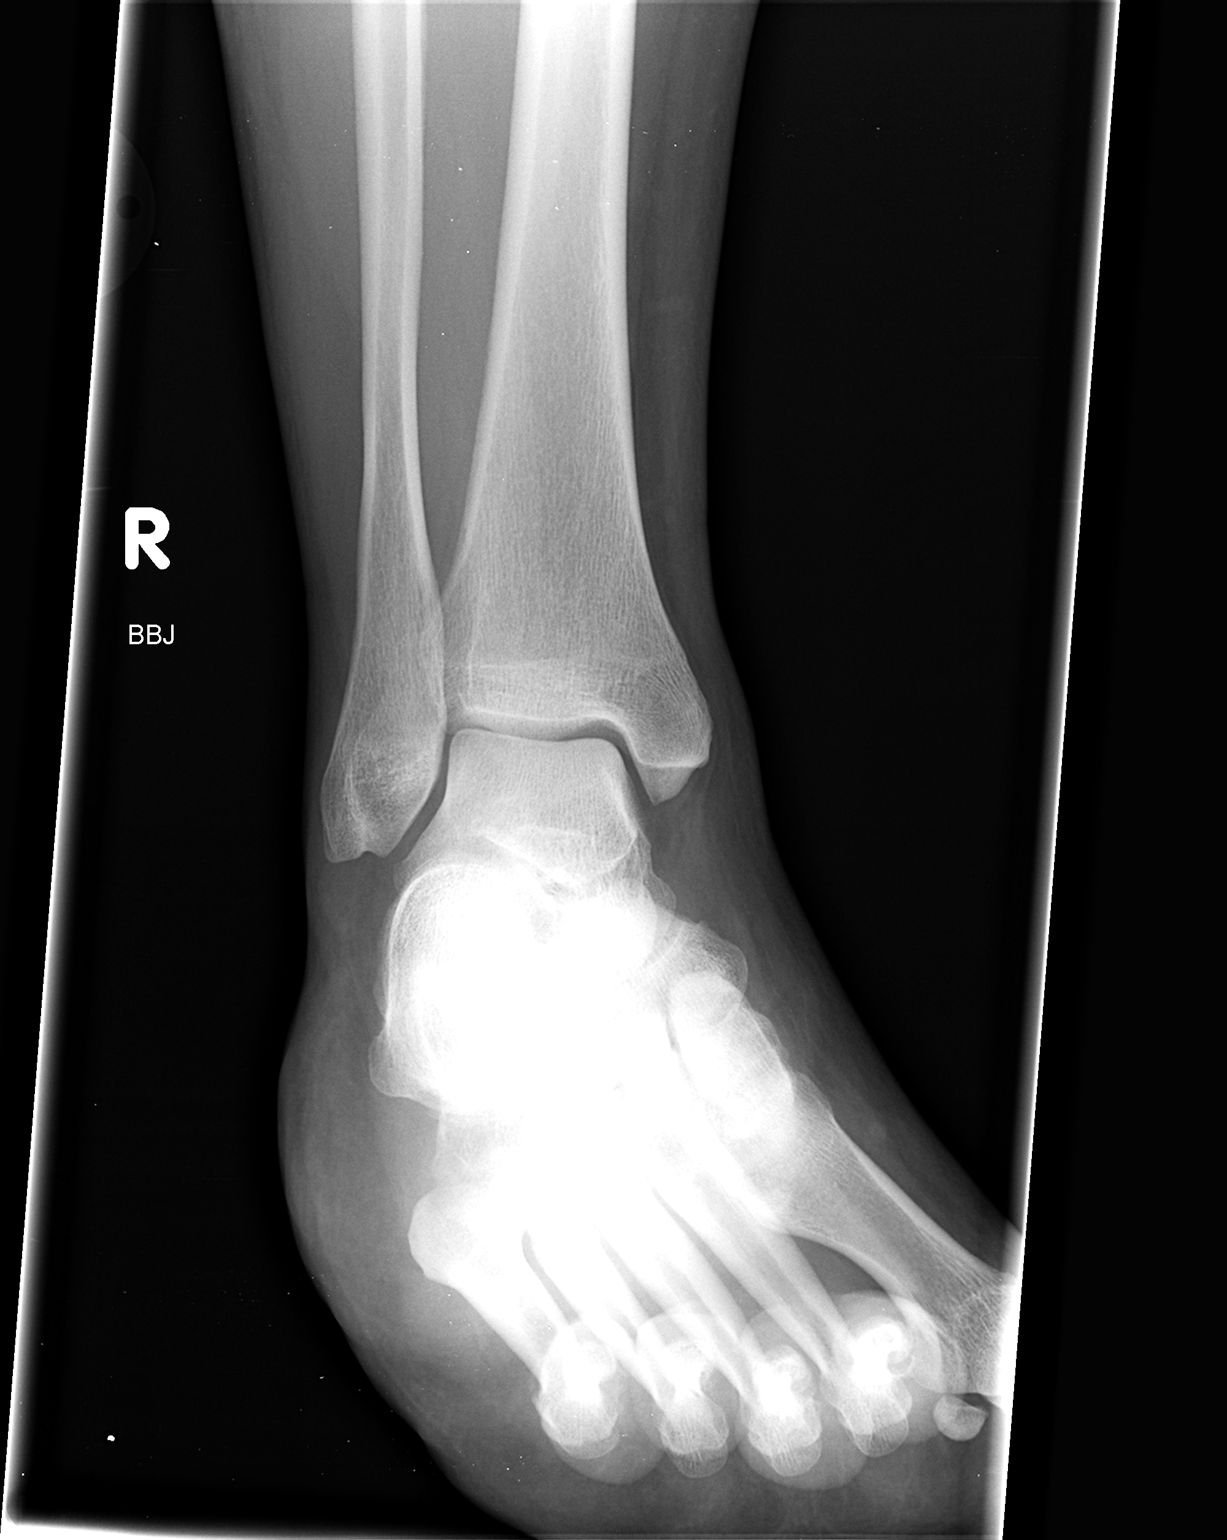

[view not recorded (3 of 3)]
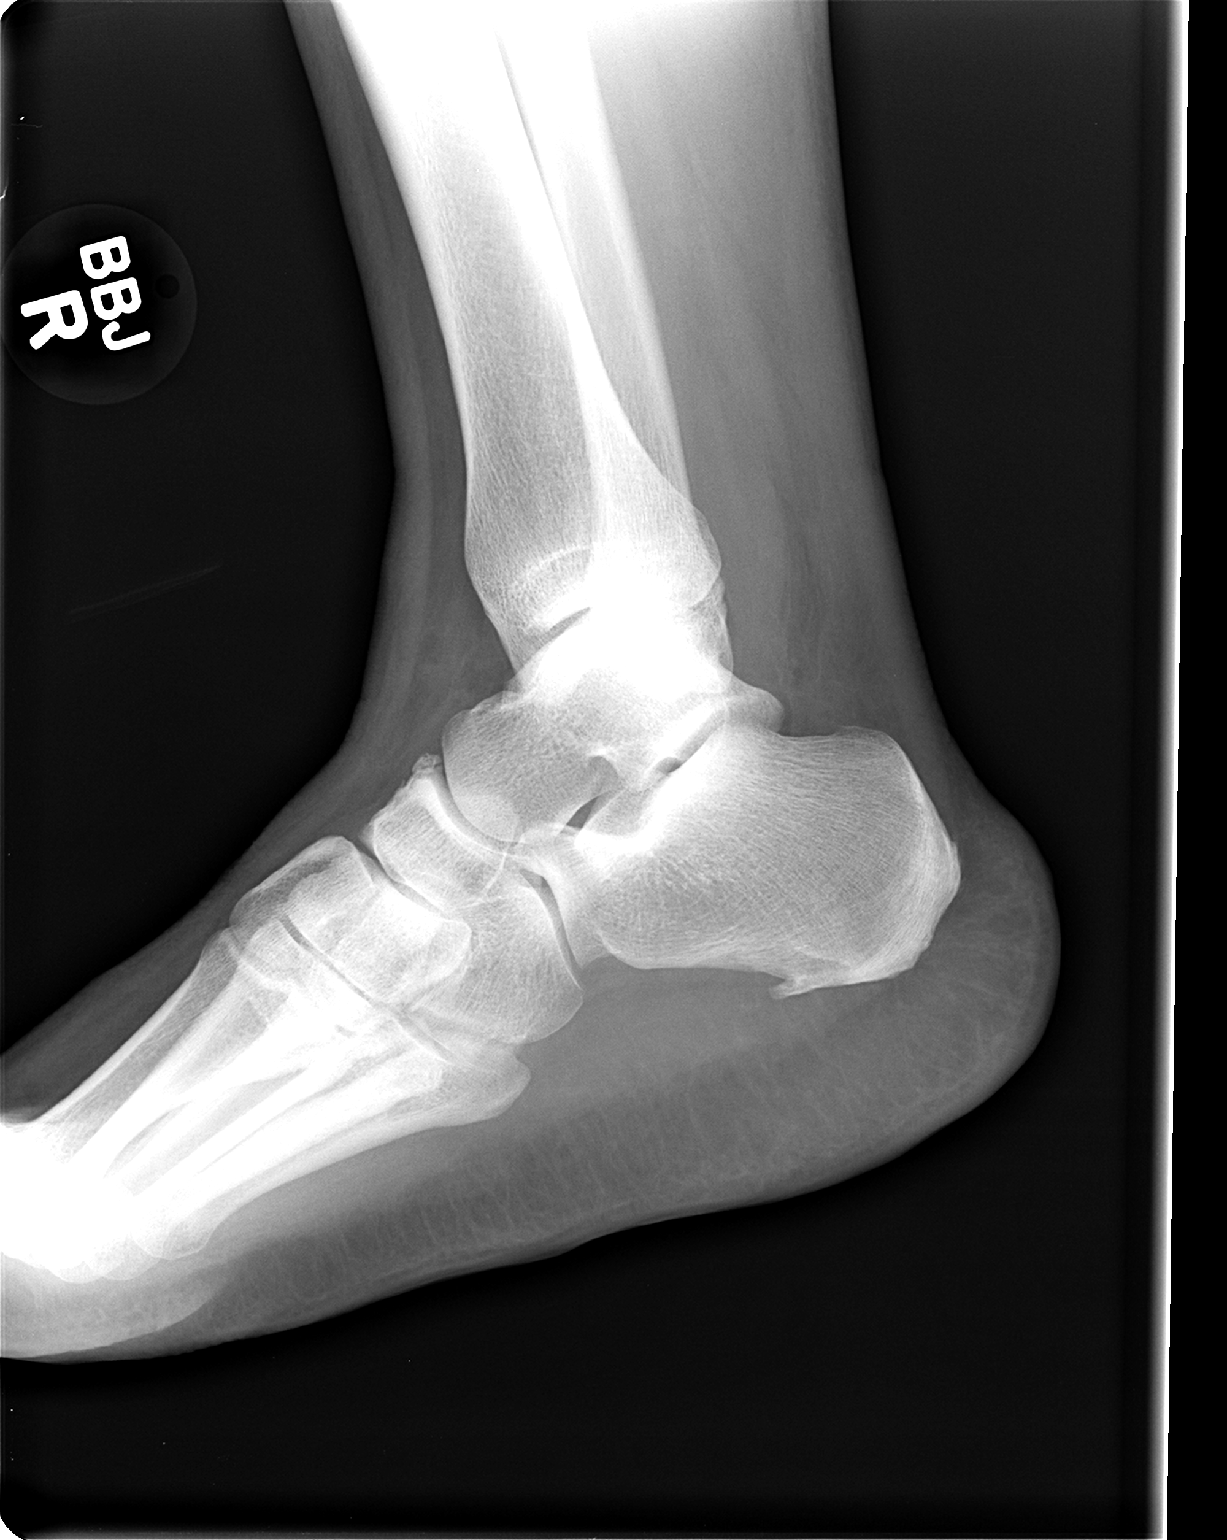

[3 of 3 positions shown; findings below may reference images not displayed]

FINDINGS: There is no evidence of fracture or dislocation.  There
is no evidence of arthropathy or other focal bony abnormality.
Soft tissues are unremarkable. Plantar spur. Old navicular avulsion
dorsally.
IMPRESSION: No acute findings.

## 2012-10-24 MED ORDER — ACETAMINOPHEN 500 MG PO TABS
1000.0000 mg | ORAL_TABLET | Freq: Once | ORAL | Status: AC
  Administered 2012-10-24: 1000 mg via ORAL
  Filled 2012-10-24: qty 2

## 2012-10-24 NOTE — ED Provider Notes (Signed)
Medical screening examination/treatment/procedure(s) were performed by non-physician practitioner and as supervising physician I was immediately available for consultation/collaboration. Devoria Albe, MD, Armando Gang   Ward Givens, MD 10/24/12 [phone]

## 2012-10-24 NOTE — ED Provider Notes (Signed)
History     CSN: [id number]  Arrival date & time 10/24/12  2038   First MD Initiated Contact with Patient 10/24/12 2112      Chief Complaint  Patient presents with  . Headache    (Consider location/radiation/quality/duration/timing/severity/associated sxs/prior treatment) HPI Comments: Pt states she was walking in a grassy area with her fiancee and stepped in a hole and inverted her R ankle.    No other injuries.  She also mentions that she has had a generalized headache for a couple days but the real reason she came to the ED was the ankle injury.  The history is provided by the patient. No language interpreter was used.    Past Medical History  Diagnosis Date  . Migraine   . Back pain, chronic   . Hypertension   . High cholesterol   . Depression   . Anxiety   . Asthma     Past Surgical History  Procedure Date  . Cesarean section   . Tubal ligation     History reviewed. No pertinent family history.  History  Substance Use Topics  . Smoking status: Former Games developer  . Smokeless tobacco: Not on file  . Alcohol Use: No    OB History    Grav Para Term Preterm Abortions TAB SAB Ect Mult Living                  Review of Systems  Musculoskeletal: Negative for joint swelling.       Ankle injury  Skin: Negative for wound.  Neurological: Positive for headaches.  All other systems reviewed and are negative.    Allergies  Aspirin; Compazine; Imitrex; Nubain; Thorazine; and Voltaren  Home Medications   Current Outpatient Rx  Name  Route  Sig  Dispense  Refill  . ALBUTEROL SULFATE HFA 108 (90 BASE) MCG/ACT IN AERS   Inhalation   Inhale 2 puffs into the lungs every 6 (six) hours as needed.         Marland Kitchen DAYQUIL/NYQUIL COLD/FLU RELIEF PO   Oral   Take 1-2 capsules by mouth daily as needed. For symptoms         . PSEUDOEPHEDRINE-ACETAMINOPHEN 30-500 MG PO TABS   Oral   Take 2 tablets by mouth every 4 (four) hours as needed. For pain           BP 129/81   Pulse 100  Temp 98.7 F (37.1 C) (Oral)  Resp 20  Ht 5\' 5"  (1.651 m)  Wt 215 lb (97.523 kg)  BMI 35.78 kg/m2  SpO2 98%  LMP 10/17/2012  Physical Exam  Nursing note and vitals reviewed. Constitutional: She is oriented to person, place, and time. Vital signs are normal. She appears well-developed and well-nourished. She is cooperative.  Non-toxic appearance. She does not have a sickly appearance. She does not appear ill. No distress.  HENT:  Head: Normocephalic and atraumatic.       Diffuse headache.  Eyes: EOM are normal.  Neck: Normal range of motion.  Cardiovascular: Normal rate and regular rhythm.   Pulmonary/Chest: Effort normal.  Abdominal: Soft. She exhibits no distension. There is no tenderness.  Musculoskeletal: She exhibits tenderness.       Right ankle: She exhibits decreased range of motion and swelling. She exhibits no ecchymosis, no deformity, no laceration and normal pulse. tenderness. Lateral malleolus tenderness found.       Feet:  Neurological: She is alert and oriented to person, place, and time. Coordination normal.  Skin: Skin is warm and dry.  Psychiatric: She has a normal mood and affect. Judgment normal.    ED Course  Procedures (including critical care time)  Labs Reviewed - No data to display Dg Ankle Complete Right  10/24/2012  *RADIOLOGY REPORT*  Clinical Data: Larey Seat and twisted ankle, pain  RIGHT ANKLE - COMPLETE 3+ VIEW  Comparison: None.  Findings: There is no evidence of fracture or dislocation.  There is no evidence of arthropathy or other focal bony abnormality. Soft tissues are unremarkable. Plantar spur. Old navicular avulsion dorsally.  IMPRESSION: No acute findings.   Original Report Authenticated By: Davonna Belling, M.D.      1. Right ankle sprain   2. Headache       MDM  ASO splint, crutches, ice, elevation Tylenol for pain F/u with [facility] prn        Evalina Field, PA 10/24/12 2143

## 2012-10-24 NOTE — ED Notes (Addendum)
Migraine headache for 2 days, no vomiting, Pain rt ankle after slipping in grass 4 pm today.  Alert, No hx of HI.

## 2013-04-15 ENCOUNTER — Encounter (HOSPITAL_COMMUNITY): Payer: Self-pay | Admitting: *Deleted

## 2013-04-15 ENCOUNTER — Emergency Department (HOSPITAL_COMMUNITY)
Admission: EM | Admit: 2013-04-15 | Discharge: 2013-04-16 | Disposition: A | Discharge status: Home / Self Care | Payer: Self-pay | Attending: Emergency Medicine | Admitting: Emergency Medicine

## 2013-04-15 DIAGNOSIS — Z87891 Personal history of nicotine dependence: Secondary | ICD-10-CM | POA: Insufficient documentation

## 2013-04-15 DIAGNOSIS — F329 Major depressive disorder, single episode, unspecified: Secondary | ICD-10-CM | POA: Insufficient documentation

## 2013-04-15 DIAGNOSIS — R51 Headache: Secondary | ICD-10-CM | POA: Insufficient documentation

## 2013-04-15 DIAGNOSIS — E7800 Pure hypercholesterolemia, unspecified: Secondary | ICD-10-CM | POA: Insufficient documentation

## 2013-04-15 DIAGNOSIS — G8929 Other chronic pain: Secondary | ICD-10-CM | POA: Insufficient documentation

## 2013-04-15 DIAGNOSIS — Z79899 Other long term (current) drug therapy: Secondary | ICD-10-CM | POA: Insufficient documentation

## 2013-04-15 DIAGNOSIS — J45909 Unspecified asthma, uncomplicated: Secondary | ICD-10-CM | POA: Insufficient documentation

## 2013-04-15 DIAGNOSIS — Z8679 Personal history of other diseases of the circulatory system: Secondary | ICD-10-CM | POA: Insufficient documentation

## 2013-04-15 DIAGNOSIS — F411 Generalized anxiety disorder: Secondary | ICD-10-CM | POA: Insufficient documentation

## 2013-04-15 DIAGNOSIS — I10 Essential (primary) hypertension: Secondary | ICD-10-CM | POA: Insufficient documentation

## 2013-04-15 DIAGNOSIS — F3289 Other specified depressive episodes: Secondary | ICD-10-CM | POA: Insufficient documentation

## 2013-04-15 MED ORDER — ONDANSETRON 4 MG PO TBDP
4.0000 mg | ORAL_TABLET | Freq: Once | ORAL | Status: AC
  Administered 2013-04-15: 4 mg via ORAL
  Filled 2013-04-15: qty 1

## 2013-04-15 MED ORDER — DIPHENHYDRAMINE HCL 25 MG PO CAPS
25.0000 mg | ORAL_CAPSULE | Freq: Once | ORAL | Status: AC
  Administered 2013-04-15: 25 mg via ORAL
  Filled 2013-04-15: qty 1

## 2013-04-15 MED ORDER — HYDROMORPHONE HCL PF 1 MG/ML IJ SOLN
1.0000 mg | Freq: Once | INTRAMUSCULAR | Status: AC
  Administered 2013-04-15: 1 mg via INTRAMUSCULAR
  Filled 2013-04-15: qty 1

## 2013-04-15 NOTE — ED Notes (Signed)
Headache for 2 days, nausea, No HI,  No fever

## 2013-04-15 NOTE — ED Provider Notes (Signed)
History    CSN: [id number] Arrival date & time 04/15/13  2205  First MD Initiated Contact with Patient 04/15/13 2333     Chief Complaint  Patient presents with  . Headache   (Consider location/radiation/quality/duration/timing/severity/associated sxs/prior Treatment) HPI HPI Comments: Beverly Tran is a 42 y.o. female who presents to the Emergency Department complaining of  Migraine headache that began 2 days ago and has not responded to aleve or excedrin extra strength. Associated with nausea. Denies fever, chills, vision changes, trouble talking or swallowing, shortness of brath, cough.  Past Medical History  Diagnosis Date  . Migraine   . Back pain, chronic   . Hypertension   . High cholesterol   . Depression   . Anxiety   . Asthma    Past Surgical History  Procedure Laterality Date  . Cesarean section    . Tubal ligation    . Cholecystectomy     History reviewed. No pertinent family history. History  Substance Use Topics  . Smoking status: Former Games developer  . Smokeless tobacco: Not on file  . Alcohol Use: No   OB History   Grav Para Term Preterm Abortions TAB SAB Ect Mult Living                 Review of Systems  Constitutional: Negative for fever.       10 Systems reviewed and are negative for acute change except as noted in the HPI.  HENT: Negative for congestion.   Eyes: Negative for discharge and redness.  Respiratory: Negative for cough and shortness of breath.   Cardiovascular: Negative for chest pain.  Gastrointestinal: Negative for vomiting and abdominal pain.  Musculoskeletal: Negative for back pain.  Skin: Negative for rash.  Neurological: Positive for headaches. Negative for syncope and numbness.  Psychiatric/Behavioral:       No behavior change.    Allergies  Aspirin; Compazine; Imitrex; Nubain; Thorazine; and Voltaren  Home Medications   Current Outpatient Rx  Name  Route  Sig  Dispense  Refill  . albuterol (PROVENTIL HFA;VENTOLIN  HFA) 108 (90 BASE) MCG/ACT inhaler   Inhalation   Inhale 2 puffs into the lungs every 6 (six) hours as needed.         . Pseudoeph-Doxylamine-DM-APAP (DAYQUIL/NYQUIL COLD/FLU RELIEF PO)   Oral   Take 1-2 capsules by mouth daily as needed. For symptoms         . pseudoephedrine-acetaminophen (TYLENOL SINUS) 30-500 MG TABS   Oral   Take 2 tablets by mouth every 4 (four) hours as needed. For pain          BP 135/96  Pulse 103  Temp(Src) 99.1 F (37.3 C) (Oral)  Resp 20  Ht 5\' 5"  (1.651 m)  Wt 220 lb (99.791 kg)  BMI 36.61 kg/m2  SpO2 98%  LMP 04/10/2013 Physical Exam  Nursing note and vitals reviewed. Constitutional: She appears well-developed and well-nourished.  Awake, alert, nontoxic appearance.  HENT:  Head: Normocephalic and atraumatic.  Very poor dentition  Eyes: EOM are normal. Pupils are equal, round, and reactive to light.  Neck: Normal range of motion. Neck supple.  Cardiovascular: Normal rate and intact distal pulses.   Pulmonary/Chest: Effort normal and breath sounds normal. She exhibits no tenderness.  Abdominal: Soft. Bowel sounds are normal. There is no tenderness. There is no rebound.  Musculoskeletal: She exhibits no tenderness.  Baseline ROM, no obvious new focal weakness.  Neurological:  Mental status and motor strength appears baseline for patient  and situation.  Skin: No rash noted.  Psychiatric: She has a normal mood and affect.    ED Course  Procedures (including critical care time) Medications  HYDROmorphone (DILAUDID) injection 1 mg (1 mg Intramuscular Given 04/15/13 2357)  ondansetron (ZOFRAN-ODT) disintegrating tablet 4 mg (4 mg Oral Given 04/15/13 2357)  diphenhydrAMINE (BENADRYL) capsule 25 mg (25 mg Oral Given 04/15/13 2357)   1238 Patient states headache is much better. She is ready for discharge. MDM  Patient presents with a migraine headache for two days that has not responded to OTC medications. Given dilaudid,  zofran, benadryl  with relief. Pt stable in ED with no significant deterioration in condition.The patient appears reasonably screened and/or stabilized for discharge and I doubt any other medical condition or other [facility] requiring further screening, evaluation, or treatment in the ED at this time prior to discharge.  MDM Reviewed: nursing note and vitals     Nicoletta Dress. Colon Branch, MD 04/16/13 0040

## 2013-04-19 ENCOUNTER — Emergency Department (HOSPITAL_COMMUNITY)
Admission: EM | Admit: 2013-04-19 | Discharge: 2013-04-19 | Disposition: A | Discharge status: Home / Self Care | Payer: Self-pay | Attending: Emergency Medicine | Admitting: Emergency Medicine

## 2013-04-19 ENCOUNTER — Encounter (HOSPITAL_COMMUNITY): Payer: Self-pay | Admitting: *Deleted

## 2013-04-19 DIAGNOSIS — M549 Dorsalgia, unspecified: Secondary | ICD-10-CM | POA: Insufficient documentation

## 2013-04-19 DIAGNOSIS — Z87891 Personal history of nicotine dependence: Secondary | ICD-10-CM | POA: Insufficient documentation

## 2013-04-19 DIAGNOSIS — F329 Major depressive disorder, single episode, unspecified: Secondary | ICD-10-CM | POA: Insufficient documentation

## 2013-04-19 DIAGNOSIS — F3289 Other specified depressive episodes: Secondary | ICD-10-CM | POA: Insufficient documentation

## 2013-04-19 DIAGNOSIS — Z8639 Personal history of other endocrine, nutritional and metabolic disease: Secondary | ICD-10-CM | POA: Insufficient documentation

## 2013-04-19 DIAGNOSIS — Z79899 Other long term (current) drug therapy: Secondary | ICD-10-CM | POA: Insufficient documentation

## 2013-04-19 DIAGNOSIS — R51 Headache: Secondary | ICD-10-CM | POA: Insufficient documentation

## 2013-04-19 DIAGNOSIS — G8929 Other chronic pain: Secondary | ICD-10-CM | POA: Insufficient documentation

## 2013-04-19 DIAGNOSIS — M25561 Pain in right knee: Secondary | ICD-10-CM

## 2013-04-19 DIAGNOSIS — Z862 Personal history of diseases of the blood and blood-forming organs and certain disorders involving the immune mechanism: Secondary | ICD-10-CM | POA: Insufficient documentation

## 2013-04-19 DIAGNOSIS — F411 Generalized anxiety disorder: Secondary | ICD-10-CM | POA: Insufficient documentation

## 2013-04-19 DIAGNOSIS — M25569 Pain in unspecified knee: Secondary | ICD-10-CM | POA: Insufficient documentation

## 2013-04-19 DIAGNOSIS — I10 Essential (primary) hypertension: Secondary | ICD-10-CM | POA: Insufficient documentation

## 2013-04-19 DIAGNOSIS — J45909 Unspecified asthma, uncomplicated: Secondary | ICD-10-CM | POA: Insufficient documentation

## 2013-04-19 DIAGNOSIS — Z8679 Personal history of other diseases of the circulatory system: Secondary | ICD-10-CM | POA: Insufficient documentation

## 2013-04-19 DIAGNOSIS — R000 Tachycardia, unspecified: Secondary | ICD-10-CM | POA: Insufficient documentation

## 2013-04-19 MED ORDER — PROMETHAZINE HCL 12.5 MG PO TABS
12.5000 mg | ORAL_TABLET | Freq: Once | ORAL | Status: AC
  Administered 2013-04-19: 12.5 mg via ORAL
  Filled 2013-04-19: qty 1

## 2013-04-19 MED ORDER — HYDROCODONE-ACETAMINOPHEN 5-325 MG PO TABS
2.0000 | ORAL_TABLET | Freq: Once | ORAL | Status: AC
  Administered 2013-04-19: 2 via ORAL
  Filled 2013-04-19: qty 2

## 2013-04-19 MED ORDER — HYDROCODONE-ACETAMINOPHEN 5-325 MG PO TABS: 1.0000 | ORAL_TABLET | ORAL | Status: DC | PRN

## 2013-04-19 NOTE — ED Notes (Signed)
Pt c/o right leg pain and a headache. Denies n/v

## 2013-04-19 NOTE — ED Provider Notes (Signed)
History    CSN: [id number] Arrival date & time 04/19/13  2215  First MD Initiated Contact with Patient 04/19/13 2229     Chief Complaint  Patient presents with  . Leg Pain  . Headache   (Consider location/radiation/quality/duration/timing/severity/associated sxs/prior Treatment) HPI Comments: Patient has history of migraine headaches. Patient states that over last 24 hours she has developed a headache that would not respond to Tylenol. She's not had any vomiting. She's not had any changes in her vision or speech. She's not had any change in sensation of her upper lower extremity. There's been no injury or trauma to the head. Patient also complains of right leg pain from the knee to the thigh.  Patient is a 42 y.o. female presenting with leg pain and headaches. The history is provided by the patient.  Leg Pain Location:  Leg Injury: no   Leg location:  R leg Pain details:    Quality:  Aching   Radiates to: RADIATES FROM RT KNEE TO THIGH.   Severity:  Moderate   Onset quality:  Gradual   Duration:  3 days   Timing:  Intermittent   Progression:  Worsening Dislocation: no   Foreign body present:  No foreign bodies Prior injury to area:  No Relieved by:  Nothing Worsened by:  Bearing weight Ineffective treatments:  Rest and acetaminophen Associated symptoms: back pain   Associated symptoms: no neck pain   Headache Associated symptoms: back pain   Associated symptoms: no abdominal pain, no cough, no dizziness, no neck pain, no photophobia and no seizures    Past Medical History  Diagnosis Date  . Migraine   . Back pain, chronic   . Hypertension   . High cholesterol   . Depression   . Anxiety   . Asthma    Past Surgical History  Procedure Laterality Date  . Cesarean section    . Tubal ligation    . Cholecystectomy     History reviewed. No pertinent family history. History  Substance Use Topics  . Smoking status: Former Games developer  . Smokeless tobacco: Not on file  .  Alcohol Use: No   OB History   Grav Para Term Preterm Abortions TAB SAB Ect Mult Living                 Review of Systems  Constitutional: Negative for activity change.       All ROS Neg except as noted in HPI  HENT: Negative for nosebleeds and neck pain.   Eyes: Negative for photophobia and discharge.  Respiratory: Negative for cough, shortness of breath and wheezing.   Cardiovascular: Negative for chest pain and palpitations.  Gastrointestinal: Negative for abdominal pain and blood in stool.  Genitourinary: Negative for dysuria, frequency and hematuria.  Musculoskeletal: Positive for back pain and arthralgias.  Skin: Negative.   Neurological: Positive for headaches. Negative for dizziness, seizures and speech difficulty.  Psychiatric/Behavioral: Negative for hallucinations and confusion. The patient is nervous/anxious.        Depression    Allergies  Salami; Aspirin; Bee venom; Compazine; Imitrex; Nubain; Thorazine; and Voltaren  Home Medications   Current Outpatient Rx  Name  Route  Sig  Dispense  Refill  . acetaminophen (TYLENOL) 500 MG tablet   Oral   Take 500-1,000 mg by mouth every 6 (six) hours as needed for pain.         Marland Kitchen albuterol (PROVENTIL HFA;VENTOLIN HFA) 108 (90 BASE) MCG/ACT inhaler   Inhalation  Inhale 2 puffs into the lungs every 6 (six) hours as needed for wheezing or shortness of breath.          . CarBAMazepine (TEGRETOL PO)   Oral   Take 300 mg by mouth every morning.         Marland Kitchen LISINOPRIL-HYDROCHLOROTHIAZIDE PO   Oral   Take 1 tablet by mouth daily.          BP 124/81  Pulse 110  Temp(Src) 98.7 F (37.1 C)  Resp 20  Ht 5\' 4"  (1.626 m)  Wt 215 lb (97.523 kg)  BMI 36.89 kg/m2  SpO2 96%  LMP 04/10/2013 Physical Exam  Nursing note and vitals reviewed. Constitutional: She is oriented to person, place, and time. She appears well-developed and well-nourished.  Non-toxic appearance.  HENT:  Head: Normocephalic.  Right Ear: Tympanic  membrane and external ear normal.  Left Ear: Tympanic membrane and external ear normal.  Eyes: EOM and lids are normal. Pupils are equal, round, and reactive to light.  Neck: Normal range of motion. Neck supple. Carotid bruit is not present.  Cardiovascular: Regular rhythm, normal heart sounds, intact distal pulses and normal pulses.  Tachycardia present.   Pulmonary/Chest: Breath sounds normal. No respiratory distress.  Abdominal: Soft. Bowel sounds are normal. There is no tenderness. There is no guarding.  Musculoskeletal: Normal range of motion.  There is soreness with flexion and extension of the right knee. Is no deformity of the quadricep area or the anterior tibial tuberosity. There is no effusion of the right knee. Is a negative Homans sign. The Achilles tendon is intact. There is no posterior mass of the knee. The dorsalis pedis pulse is 2+.  Lymphadenopathy:       Head (right side): No submandibular adenopathy present.       Head (left side): No submandibular adenopathy present.    She has no cervical adenopathy.  Neurological: She is alert and oriented to person, place, and time. She has normal strength. No cranial nerve deficit or sensory deficit.  Gait is steady. There is no gross motor or sensory deficit appreciated.  Skin: Skin is warm and dry.  Psychiatric: She has a normal mood and affect. Her speech is normal.    ED Course  Procedures (including critical care time) Labs Reviewed - No data to display No results found. No diagnosis found.  MDM  I have reviewed nursing notes, vital signs, and all appropriate lab and imaging results for this patient. Patient presents to the emergency department with pain of the right leg, and headache. The headache is from the temporal areas and around to the back of the neck. The leg pain is from the right knee to the right thigh. There's been no injury or accident.  Patient is fitted with a knee immobilizer. She is to with Norco one every  4 hours as needed for pain. Patient is to see the orthopedist for evaluation of her knee pain, and her primary physician for evaluation of her headaches. Patient is to return to the emergency department if any changes, problems, or concerns.  Kathie Dike, PA-C 04/19/13 2333

## 2013-04-20 NOTE — ED Provider Notes (Signed)
Medical screening examination/treatment/procedure(s) were performed by non-physician practitioner and as supervising physician I was immediately available for consultation/collaboration.  Jamesyn Lindell R. Devontae Casasola, MD 04/20/13 2152 

## 2013-04-28 ENCOUNTER — Emergency Department (HOSPITAL_COMMUNITY)
Admission: EM | Admit: 2013-04-28 | Discharge: 2013-04-28 | Disposition: A | Discharge status: Home / Self Care | Payer: Self-pay | Attending: Emergency Medicine | Admitting: Emergency Medicine

## 2013-04-28 ENCOUNTER — Encounter (HOSPITAL_COMMUNITY): Payer: Self-pay | Admitting: *Deleted

## 2013-04-28 DIAGNOSIS — Z8639 Personal history of other endocrine, nutritional and metabolic disease: Secondary | ICD-10-CM | POA: Insufficient documentation

## 2013-04-28 DIAGNOSIS — Z8659 Personal history of other mental and behavioral disorders: Secondary | ICD-10-CM | POA: Insufficient documentation

## 2013-04-28 DIAGNOSIS — H53149 Visual discomfort, unspecified: Secondary | ICD-10-CM | POA: Insufficient documentation

## 2013-04-28 DIAGNOSIS — G8929 Other chronic pain: Secondary | ICD-10-CM | POA: Insufficient documentation

## 2013-04-28 DIAGNOSIS — R51 Headache: Secondary | ICD-10-CM | POA: Insufficient documentation

## 2013-04-28 DIAGNOSIS — R110 Nausea: Secondary | ICD-10-CM | POA: Insufficient documentation

## 2013-04-28 DIAGNOSIS — I10 Essential (primary) hypertension: Secondary | ICD-10-CM | POA: Insufficient documentation

## 2013-04-28 DIAGNOSIS — Z87891 Personal history of nicotine dependence: Secondary | ICD-10-CM | POA: Insufficient documentation

## 2013-04-28 DIAGNOSIS — Z8679 Personal history of other diseases of the circulatory system: Secondary | ICD-10-CM | POA: Insufficient documentation

## 2013-04-28 DIAGNOSIS — J45909 Unspecified asthma, uncomplicated: Secondary | ICD-10-CM | POA: Insufficient documentation

## 2013-04-28 DIAGNOSIS — Z79899 Other long term (current) drug therapy: Secondary | ICD-10-CM | POA: Insufficient documentation

## 2013-04-28 DIAGNOSIS — Z862 Personal history of diseases of the blood and blood-forming organs and certain disorders involving the immune mechanism: Secondary | ICD-10-CM | POA: Insufficient documentation

## 2013-04-28 MED ORDER — HYDROMORPHONE HCL PF 1 MG/ML IJ SOLN
1.0000 mg | Freq: Once | INTRAMUSCULAR | Status: AC
  Administered 2013-04-28: 1 mg via INTRAMUSCULAR
  Filled 2013-04-28: qty 1

## 2013-04-28 MED ORDER — PROMETHAZINE HCL 25 MG PO TABS: 25.0000 mg | ORAL_TABLET | Freq: Four times a day (QID) | ORAL | Status: DC | PRN

## 2013-04-28 MED ORDER — OXYCODONE-ACETAMINOPHEN 5-325 MG PO TABS: 2.0000 | ORAL_TABLET | ORAL | Status: DC | PRN

## 2013-04-28 MED ORDER — ONDANSETRON 8 MG PO TBDP
8.0000 mg | ORAL_TABLET | Freq: Once | ORAL | Status: AC
  Administered 2013-04-28: 8 mg via ORAL
  Filled 2013-04-28: qty 1

## 2013-04-28 NOTE — ED Provider Notes (Signed)
History    This chart was scribed for Donnetta Hutching, MD by Quintella Reichert, ED scribe.  This patient was seen in room [facility]09/[facility]09 and the patient's care was started at 10:25 PM.  CSN: [id number]  Arrival date & time 04/28/13  2045    Chief Complaint  Patient presents with  . Headache    The history is provided by the patient. No language interpreter was used.     HPI Comments: Beverly Tran is a 42 y.o. female with h/o migraines who presents to the Emergency Department complaining of one day of constant, gradual-onset, gradually-worsening moderate headache, with accompanying nausea and photophobia.  HA is localized to the bitemporal and occipital region and is described as similar to patient's recurrent migraines.  She has attempted to treat pain with Tylenol Extra Strength, without relief.   She reports that she has had migraines since age 18 and she has them several times each month.  She denies any specific precipitating factor for her present migraine.  Patient notes that [facility]     Past Medical History  Diagnosis Date  . Migraine   . Back pain, chronic   . Hypertension   . High cholesterol   . Depression   . Anxiety   . Asthma     Past Surgical History  Procedure Laterality Date  . Cesarean section    . Tubal ligation    . Cholecystectomy      History reviewed. No pertinent family history.   History  Substance Use Topics  . Smoking status: Former Games developer  . Smokeless tobacco: Not on file  . Alcohol Use: No    OB History   Grav Para Term Preterm Abortions TAB SAB Ect Mult Living                   Review of Systems A complete 10 system review of systems was obtained and all systems are negative except as noted in the HPI and PMH.     Allergies  Salami; Aspirin; Bee venom; Compazine; Imitrex; Nubain; Thorazine; and Voltaren  Home Medications   Current Outpatient Rx  Name  Route  Sig  Dispense   Refill  . acetaminophen (TYLENOL) 500 MG tablet   Oral   Take 500-1,000 mg by mouth every 6 (six) hours as needed for pain.         Marland Kitchen albuterol (PROVENTIL HFA;VENTOLIN HFA) 108 (90 BASE) MCG/ACT inhaler   Inhalation   Inhale 2 puffs into the lungs every 6 (six) hours as needed for wheezing or shortness of breath.           BP 123/78  Pulse 97  Temp(Src) 98.3 F (36.8 C) (Oral)  Resp 20  Ht 5\' 5"  (1.651 m)  Wt 215 lb (97.523 kg)  BMI 35.78 kg/m2  SpO2 99%  LMP 04/26/2013  Physical Exam  Nursing note and vitals reviewed. Constitutional: She is oriented to person, place, and time. She appears well-developed and well-nourished.  HENT:  Head: Normocephalic and atraumatic.  Eyes: Conjunctivae and EOM are normal. Pupils are equal, round, and reactive to light.  Neck: Normal range of motion. Neck supple.  Cardiovascular: Normal rate, regular rhythm and normal heart sounds.   Pulmonary/Chest: Effort normal and breath sounds normal.  Abdominal: Soft. Bowel sounds are normal.  Musculoskeletal: Normal range of motion.  Neurological: She is alert and oriented to person, place, and time.  Skin: Skin is warm and  dry.  Psychiatric: She has a normal mood and affect.    ED Course  Procedures (including critical care time)   DIAGNOSTIC STUDIES: Oxygen Saturation is 99% on room air, normal by my interpretation.    COORDINATION OF CARE: 10:27 PM: Discussed treatment plan which includes Dilaudid and Zofran injection.  Pt expressed understanding and agreed to plan.    Labs Reviewed - No data to display  No results found.  No diagnosis found.  MDM  Patient has typical migraine headache. No neuro deficits    I personally performed the services described in this documentation, which was scribed in my presence. The recorded information has been reviewed and is accurate.    Donnetta Hutching, MD 04/28/13 [phone]

## 2013-04-28 NOTE — ED Notes (Signed)
Pt alert & oriented x4, stable gait. Patient given discharge instructions, paperwork & prescription(s). Patient  instructed to stop at the registration desk to finish any additional paperwork. Patient verbalized understanding. Pt left department w/ no further questions. 

## 2013-04-28 NOTE — ED Notes (Signed)
Pt c/o headache and nausea x 1 day. Extra strength tylenol does not help the pain.

## 2013-05-04 ENCOUNTER — Encounter (HOSPITAL_COMMUNITY): Payer: Self-pay

## 2013-05-04 ENCOUNTER — Emergency Department (HOSPITAL_COMMUNITY)
Admission: EM | Admit: 2013-05-04 | Discharge: 2013-05-04 | Disposition: A | Discharge status: Home / Self Care | Payer: Self-pay | Attending: Emergency Medicine | Admitting: Emergency Medicine

## 2013-05-04 DIAGNOSIS — R112 Nausea with vomiting, unspecified: Secondary | ICD-10-CM | POA: Insufficient documentation

## 2013-05-04 DIAGNOSIS — Z8679 Personal history of other diseases of the circulatory system: Secondary | ICD-10-CM | POA: Insufficient documentation

## 2013-05-04 DIAGNOSIS — J45909 Unspecified asthma, uncomplicated: Secondary | ICD-10-CM | POA: Insufficient documentation

## 2013-05-04 DIAGNOSIS — Z87891 Personal history of nicotine dependence: Secondary | ICD-10-CM | POA: Insufficient documentation

## 2013-05-04 DIAGNOSIS — Z79899 Other long term (current) drug therapy: Secondary | ICD-10-CM | POA: Insufficient documentation

## 2013-05-04 DIAGNOSIS — G8929 Other chronic pain: Secondary | ICD-10-CM | POA: Insufficient documentation

## 2013-05-04 DIAGNOSIS — R51 Headache: Secondary | ICD-10-CM | POA: Insufficient documentation

## 2013-05-04 DIAGNOSIS — I10 Essential (primary) hypertension: Secondary | ICD-10-CM | POA: Insufficient documentation

## 2013-05-04 DIAGNOSIS — R05 Cough: Secondary | ICD-10-CM | POA: Insufficient documentation

## 2013-05-04 DIAGNOSIS — Z8659 Personal history of other mental and behavioral disorders: Secondary | ICD-10-CM | POA: Insufficient documentation

## 2013-05-04 DIAGNOSIS — Z8639 Personal history of other endocrine, nutritional and metabolic disease: Secondary | ICD-10-CM | POA: Insufficient documentation

## 2013-05-04 DIAGNOSIS — M549 Dorsalgia, unspecified: Secondary | ICD-10-CM | POA: Insufficient documentation

## 2013-05-04 DIAGNOSIS — R059 Cough, unspecified: Secondary | ICD-10-CM | POA: Insufficient documentation

## 2013-05-04 DIAGNOSIS — Z862 Personal history of diseases of the blood and blood-forming organs and certain disorders involving the immune mechanism: Secondary | ICD-10-CM | POA: Insufficient documentation

## 2013-05-04 MED ORDER — DIPHENHYDRAMINE HCL 50 MG/ML IJ SOLN
25.0000 mg | Freq: Once | INTRAMUSCULAR | Status: AC
  Administered 2013-05-04: 25 mg via INTRAMUSCULAR
  Filled 2013-05-04: qty 1

## 2013-05-04 MED ORDER — METOCLOPRAMIDE HCL 5 MG/ML IJ SOLN
10.0000 mg | Freq: Once | INTRAMUSCULAR | Status: AC
  Administered 2013-05-04: 10 mg via INTRAMUSCULAR
  Filled 2013-05-04: qty 2

## 2013-05-04 NOTE — ED Notes (Signed)
Pt reports headache onset last night, states she took her last percocet that she got here the last time she was here, nausea

## 2013-05-04 NOTE — ED Notes (Signed)
Pt given sprite per request 

## 2013-05-04 NOTE — ED Provider Notes (Signed)
History    CSN: [id number] Arrival date & time 05/04/13  0119  First MD Initiated Contact with Patient 05/04/13 0132     Chief Complaint  Patient presents with  . Headache  . Cough    Patient is a 42 y.o. female presenting with headaches. The history is provided by the patient.  Headache Pain location:  Generalized Onset quality:  Gradual Duration:  1 day Timing:  Constant Progression:  Worsening Chronicity:  Recurrent Similar to prior headaches: yes   Relieved by:  Nothing Worsened by:  Light Associated symptoms: cough, nausea and vomiting   Associated symptoms: no abdominal pain, no blurred vision, no neck stiffness, no seizures, no visual change and no weakness   no head trauma No tick bites No rash Similar to prior headaches Past Medical History  Diagnosis Date  . Migraine   . Back pain, chronic   . Hypertension   . High cholesterol   . Depression   . Anxiety   . Asthma    Past Surgical History  Procedure Laterality Date  . Cesarean section    . Tubal ligation    . Cholecystectomy     No family history on file. History  Substance Use Topics  . Smoking status: Former Games developer  . Smokeless tobacco: Not on file  . Alcohol Use: No   OB History   Grav Para Term Preterm Abortions TAB SAB Ect Mult Living                 Review of Systems  HENT: Negative for neck stiffness.   Eyes: Negative for blurred vision.  Respiratory: Positive for cough.   Cardiovascular: Negative for chest pain.  Gastrointestinal: Positive for nausea and vomiting. Negative for abdominal pain.  Neurological: Positive for headaches. Negative for seizures and weakness.  All other systems reviewed and are negative.    Allergies  Salami; Aspirin; Bee venom; Compazine; Imitrex; Nubain; Thorazine; Voltaren; and Zofran  Home Medications   Current Outpatient Rx  Name  Route  Sig  Dispense  Refill  . acetaminophen (TYLENOL) 500 MG tablet   Oral   Take 500-1,000 mg by mouth every 6  (six) hours as needed for pain.         Marland Kitchen albuterol (PROVENTIL HFA;VENTOLIN HFA) 108 (90 BASE) MCG/ACT inhaler   Inhalation   Inhale 2 puffs into the lungs every 6 (six) hours as needed for wheezing or shortness of breath.          . oxyCODONE-acetaminophen (PERCOCET) 5-325 MG per tablet   Oral   Take 2 tablets by mouth every 4 (four) hours as needed for pain.   10 tablet   0   . promethazine (PHENERGAN) 25 MG tablet   Oral   Take 1 tablet (25 mg total) by mouth every 6 (six) hours as needed for nausea.   10 tablet   0    BP 142/94  Pulse 94  Temp(Src) 98 F (36.7 C) (Oral)  Resp 18  Ht 5\' 5"  (1.651 m)  Wt 201 lb (91.173 kg)  BMI 33.45 kg/m2  SpO2 96%  LMP 04/26/2013 Physical Exam CONSTITUTIONAL: Well developed/well nourished HEAD: Normocephalic/atraumatic EYES: EOMI/PERRL, no nystagmus ENMT: Mucous membranes moist NECK: supple no meningeal signs, no bruits SPINE:entire spine nontender CV: S1/S2 noted, no murmurs/rubs/gallops noted LUNGS: Lungs are clear to auscultation bilaterally, no apparent distress ABDOMEN: soft, nontender, no rebound or guarding GU:no cva tenderness NEURO:Awake/alert, facies symmetric, no arm or leg drift is noted Cranial  nerves 3/4/5/6/04/24/09/11/12 tested and intact Gait normal without ataxia No past pointing EXTREMITIES: pulses normal, full ROM SKIN: warm, color normal PSYCH: no abnormalities of mood noted   ED Course  Procedures  1. Headache     MDM  Nursing notes including past medical history and social history reviewed and considered in documentation Previous records reviewed and considered  Pt with multiple visits recently for HA.  She has no neuro deficits.  She has no concerning history for SAH/meningitis or other acute neurologic process Advised f/u with neurology or pain specialist   Joya Gaskins, MD 05/04/13 [phone]

## 2013-05-06 ENCOUNTER — Encounter (HOSPITAL_COMMUNITY): Payer: Self-pay | Admitting: *Deleted

## 2013-05-06 ENCOUNTER — Emergency Department (HOSPITAL_COMMUNITY)
Admission: EM | Admit: 2013-05-06 | Discharge: 2013-05-06 | Disposition: A | Discharge status: Home / Self Care | Payer: Self-pay | Attending: Emergency Medicine | Admitting: Emergency Medicine

## 2013-05-06 DIAGNOSIS — J45909 Unspecified asthma, uncomplicated: Secondary | ICD-10-CM | POA: Insufficient documentation

## 2013-05-06 DIAGNOSIS — I10 Essential (primary) hypertension: Secondary | ICD-10-CM | POA: Insufficient documentation

## 2013-05-06 DIAGNOSIS — Z79899 Other long term (current) drug therapy: Secondary | ICD-10-CM | POA: Insufficient documentation

## 2013-05-06 DIAGNOSIS — Z8659 Personal history of other mental and behavioral disorders: Secondary | ICD-10-CM | POA: Insufficient documentation

## 2013-05-06 DIAGNOSIS — G8929 Other chronic pain: Secondary | ICD-10-CM | POA: Insufficient documentation

## 2013-05-06 DIAGNOSIS — Z862 Personal history of diseases of the blood and blood-forming organs and certain disorders involving the immune mechanism: Secondary | ICD-10-CM | POA: Insufficient documentation

## 2013-05-06 DIAGNOSIS — Z8639 Personal history of other endocrine, nutritional and metabolic disease: Secondary | ICD-10-CM | POA: Insufficient documentation

## 2013-05-06 DIAGNOSIS — R55 Syncope and collapse: Secondary | ICD-10-CM | POA: Insufficient documentation

## 2013-05-06 DIAGNOSIS — M549 Dorsalgia, unspecified: Secondary | ICD-10-CM | POA: Insufficient documentation

## 2013-05-06 DIAGNOSIS — Z87891 Personal history of nicotine dependence: Secondary | ICD-10-CM | POA: Insufficient documentation

## 2013-05-06 DIAGNOSIS — R42 Dizziness and giddiness: Secondary | ICD-10-CM | POA: Insufficient documentation

## 2013-05-06 DIAGNOSIS — G43909 Migraine, unspecified, not intractable, without status migrainosus: Secondary | ICD-10-CM | POA: Insufficient documentation

## 2013-05-06 MED ORDER — KETOROLAC TROMETHAMINE 30 MG/ML IJ SOLN
30.0000 mg | Freq: Once | INTRAMUSCULAR | Status: AC
  Administered 2013-05-06: 30 mg via INTRAVENOUS
  Filled 2013-05-06: qty 1

## 2013-05-06 MED ORDER — METOCLOPRAMIDE HCL 10 MG PO TABS: 10.0000 mg | ORAL_TABLET | Freq: Four times a day (QID) | ORAL | Status: DC | PRN

## 2013-05-06 MED ORDER — SODIUM CHLORIDE 0.9 % IV BOLUS (SEPSIS)
1000.0000 mL | Freq: Once | INTRAVENOUS | Status: AC
  Administered 2013-05-06: 1000 mL via INTRAVENOUS

## 2013-05-06 MED ORDER — DIPHENHYDRAMINE HCL 50 MG/ML IJ SOLN
50.0000 mg | Freq: Once | INTRAMUSCULAR | Status: AC
  Administered 2013-05-06: 50 mg via INTRAVENOUS
  Filled 2013-05-06: qty 1

## 2013-05-06 MED ORDER — METOCLOPRAMIDE HCL 5 MG/ML IJ SOLN
10.0000 mg | Freq: Once | INTRAMUSCULAR | Status: AC
  Administered 2013-05-06: 10 mg via INTRAVENOUS
  Filled 2013-05-06: qty 2

## 2013-05-06 NOTE — ED Notes (Signed)
Pt reports a headache that started yesterday. Pt states she almost past out today, dizzy, & thinks her speech is slurred.

## 2013-05-06 NOTE — ED Provider Notes (Signed)
History    CSN: [id number] Arrival date & time 05/06/13  2017  First MD Initiated Contact with Patient 05/06/13 2114     Chief Complaint  Patient presents with  . Migraine  . Near Syncope  . Dizziness   (Consider location/radiation/quality/duration/timing/severity/associated sxs/prior Treatment) HPI This 42 year old female has had chronic intermittent headaches for years they were once or twice a month but now they've been several times a week for the last several months, she is a typical headache for about the last 24 hours now gradual onset occasionally throbbing moderately severe but was mild at its onset no nausea or vomiting no fever no trauma no change in speech vision swallowing or understanding no focal or lateralizing weakness numbness incoordination or vertigo, tonight when she stood up quickly felt a bit lightheaded with presyncope but no syncope no amnesia and even though the initial nurse's note stated the patient thought she had slurred speech the patient denies this to me, treatment prior to arrival consisted of Tylenol with some partial improvement, even though her allergy list states aspirin and diclofenac the patient states she has had Toradol without problems in the past. The patient's current headache was gradual onset yesterday evening and gradually worsening throughout today generalized throbbing headache with no associated symptoms now moderately severe like she has had multiple times in the past. Past Medical History  Diagnosis Date  . Migraine   . Back pain, chronic   . Hypertension   . High cholesterol   . Depression   . Anxiety   . Asthma    Past Surgical History  Procedure Laterality Date  . Cesarean section    . Tubal ligation    . Cholecystectomy     No family history on file. History  Substance Use Topics  . Smoking status: Former Games developer  . Smokeless tobacco: Not on file  . Alcohol Use: No   OB History   Grav Para Term Preterm Abortions TAB SAB  Ect Mult Living                 Review of Systems 10 Systems reviewed and are negative for acute change except as noted in the HPI. Allergies  Salami; Aspirin; Bee venom; Compazine; Imitrex; Nubain; Thorazine; Voltaren; and Zofran  Home Medications   Current Outpatient Rx  Name  Route  Sig  Dispense  Refill  . acetaminophen (TYLENOL) 500 MG tablet   Oral   Take 500-1,000 mg by mouth every 6 (six) hours as needed for pain.         Marland Kitchen albuterol (PROVENTIL HFA;VENTOLIN HFA) 108 (90 BASE) MCG/ACT inhaler   Inhalation   Inhale 2 puffs into the lungs every 6 (six) hours as needed for wheezing or shortness of breath.          . promethazine (PHENERGAN) 25 MG tablet   Oral   Take 1 tablet (25 mg total) by mouth every 6 (six) hours as needed for nausea.   10 tablet   0   . metoCLOPramide (REGLAN) 10 MG tablet   Oral   Take 1 tablet (10 mg total) by mouth every 6 (six) hours as needed (nausea/headache).   6 tablet   0    BP 141/90  Pulse 106  Temp(Src) 99.2 F (37.3 C) (Oral)  Resp 18  Ht 5\' 5"  (1.651 m)  Wt 201 lb (91.173 kg)  BMI 33.45 kg/m2  SpO2 96%  LMP 04/26/2013 Physical Exam  Nursing note and vitals reviewed. Constitutional:  Awake, alert, nontoxic appearance with baseline speech for patient.  HENT:  Head: Atraumatic.  Mouth/Throat: No oropharyngeal exudate.  Eyes: EOM are normal. Pupils are equal, round, and reactive to light. Right eye exhibits no discharge. Left eye exhibits no discharge.  Neck: Neck supple.  Cardiovascular: Normal rate and regular rhythm.   No murmur heard. Pulmonary/Chest: Effort normal and breath sounds normal. No stridor. No respiratory distress. She has no wheezes. She has no rales. She exhibits no tenderness.  Abdominal: Soft. Bowel sounds are normal. She exhibits no mass. There is no tenderness. There is no rebound.  Musculoskeletal: She exhibits no tenderness.  Baseline ROM, moves extremities with no obvious new focal weakness.   Lymphadenopathy:    She has no cervical adenopathy.  Neurological:  Awake, alert, cooperative and aware of situation; motor strength bilaterally; sensation normal to light touch bilaterally; peripheral visual fields full to confrontation; no facial asymmetry; tongue midline; major cranial nerves appear intact; no pronator drift, normal finger to nose bilaterally, baseline gait without new ataxia.  Skin: No rash noted.  Psychiatric: She has a normal mood and affect.    ED Course  Procedures (including critical care time) Pt feels improved after observation and/or treatment in ED.Patient / Family / Caregiver informed of clinical course, understand medical decision-making process, and agree with plan. Labs Reviewed - No data to display No results found. 1. Headache   2. Chronic headaches     MDM  I doubt any other EMC precluding discharge at this time including, but not necessarily limited to the following:SBI, SAH, CVA.  Hurman Horn, MD 05/08/13 2139

## 2013-05-11 ENCOUNTER — Emergency Department (HOSPITAL_COMMUNITY): Payer: Self-pay

## 2013-05-11 ENCOUNTER — Encounter (HOSPITAL_COMMUNITY): Payer: Self-pay | Admitting: *Deleted

## 2013-05-11 ENCOUNTER — Emergency Department (HOSPITAL_COMMUNITY)
Admission: EM | Admit: 2013-05-11 | Discharge: 2013-05-11 | Disposition: A | Discharge status: Home / Self Care | Payer: Self-pay | Attending: Emergency Medicine | Admitting: Emergency Medicine

## 2013-05-11 DIAGNOSIS — M25559 Pain in unspecified hip: Secondary | ICD-10-CM | POA: Insufficient documentation

## 2013-05-11 DIAGNOSIS — Z79899 Other long term (current) drug therapy: Secondary | ICD-10-CM | POA: Insufficient documentation

## 2013-05-11 DIAGNOSIS — I10 Essential (primary) hypertension: Secondary | ICD-10-CM | POA: Insufficient documentation

## 2013-05-11 DIAGNOSIS — R110 Nausea: Secondary | ICD-10-CM | POA: Insufficient documentation

## 2013-05-11 DIAGNOSIS — R51 Headache: Secondary | ICD-10-CM | POA: Insufficient documentation

## 2013-05-11 DIAGNOSIS — J3489 Other specified disorders of nose and nasal sinuses: Secondary | ICD-10-CM | POA: Insufficient documentation

## 2013-05-11 DIAGNOSIS — J45901 Unspecified asthma with (acute) exacerbation: Secondary | ICD-10-CM | POA: Insufficient documentation

## 2013-05-11 DIAGNOSIS — Z87891 Personal history of nicotine dependence: Secondary | ICD-10-CM | POA: Insufficient documentation

## 2013-05-11 DIAGNOSIS — Z8639 Personal history of other endocrine, nutritional and metabolic disease: Secondary | ICD-10-CM | POA: Insufficient documentation

## 2013-05-11 DIAGNOSIS — M5430 Sciatica, unspecified side: Secondary | ICD-10-CM | POA: Insufficient documentation

## 2013-05-11 DIAGNOSIS — Z8659 Personal history of other mental and behavioral disorders: Secondary | ICD-10-CM | POA: Insufficient documentation

## 2013-05-11 DIAGNOSIS — H53149 Visual discomfort, unspecified: Secondary | ICD-10-CM | POA: Insufficient documentation

## 2013-05-11 DIAGNOSIS — Z862 Personal history of diseases of the blood and blood-forming organs and certain disorders involving the immune mechanism: Secondary | ICD-10-CM | POA: Insufficient documentation

## 2013-05-11 DIAGNOSIS — G43909 Migraine, unspecified, not intractable, without status migrainosus: Secondary | ICD-10-CM | POA: Insufficient documentation

## 2013-05-11 DIAGNOSIS — M5432 Sciatica, left side: Secondary | ICD-10-CM

## 2013-05-11 IMAGING — CT CT HEAD W/O CM
1 series · 16 of 30 positions shown, 20 images · non-contrast
Comparison: [DATE]

CLINICAL DATA: Severe headache with nausea

CT HEAD WITHOUT CONTRAST
TECHNIQUE: Contiguous axial images were obtained from the base of
the skull through the vertex without contrast. Study was obtained
within 24 hours of patient arrival at the emergency department.

[Series 2: headseq 4.8 h37s · axial · 0.40mm/px · z∈[+79,+239]mm · 16 of 36 slices shown, 20 images]
[im 2/36  brain]
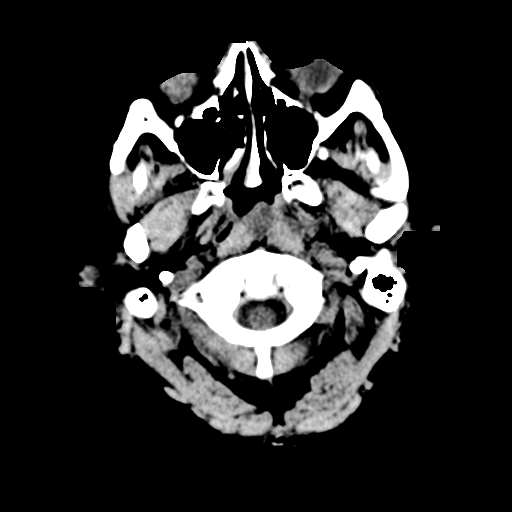
[im 2/36  bone]
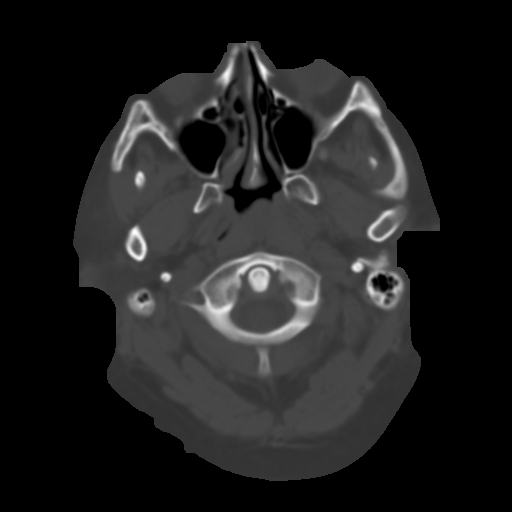
[im 4/36  brain]
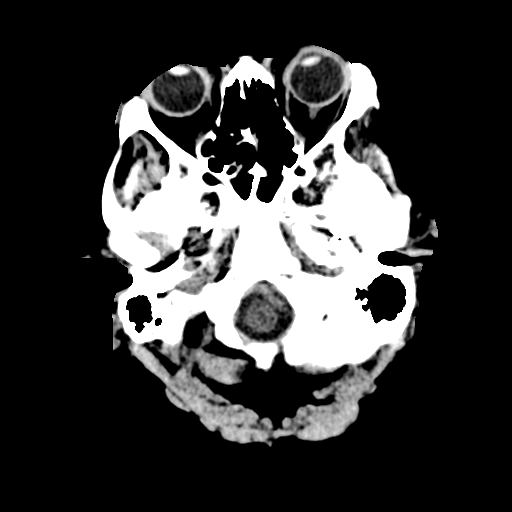
[im 7/36  brain]
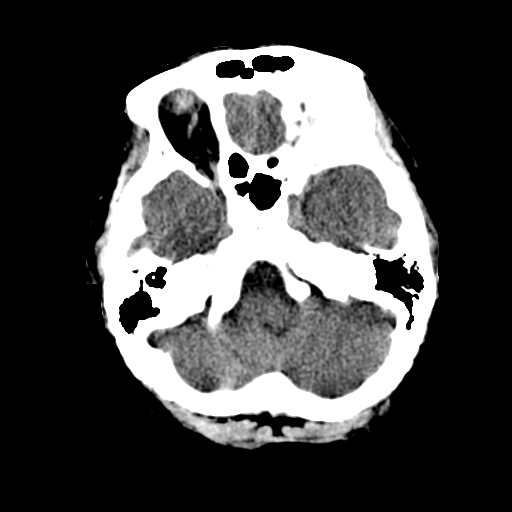
[im 9/36  brain]
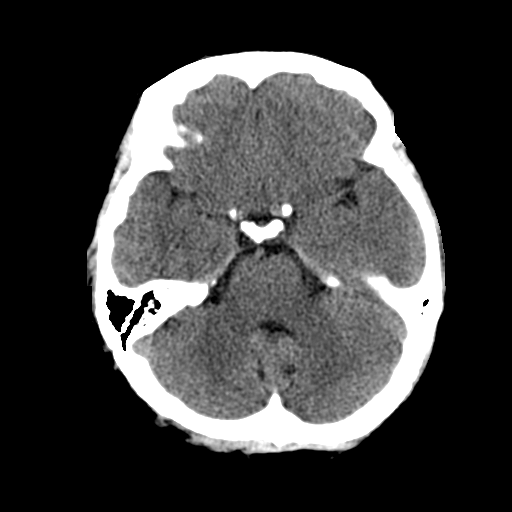
[im 10/36  brain]
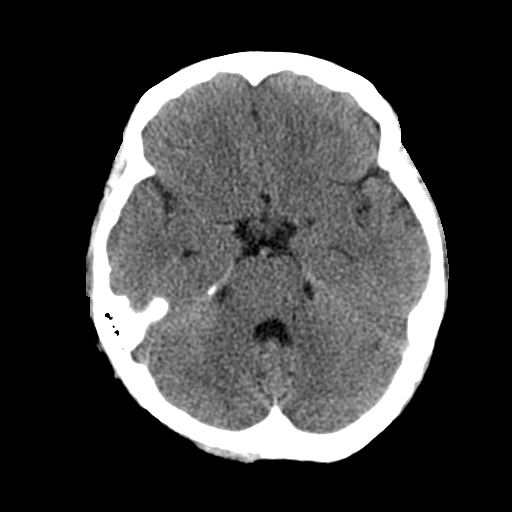
[im 10/36  bone]
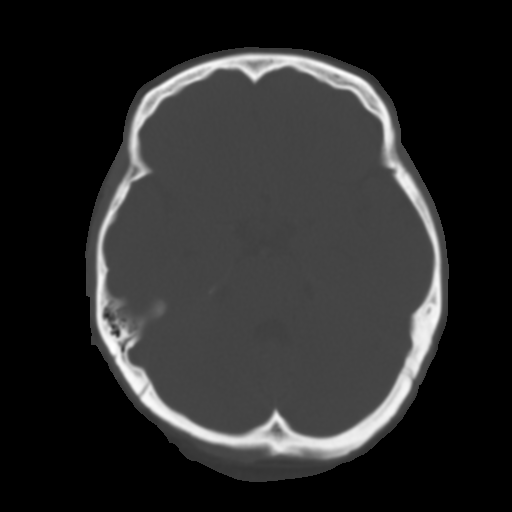
[im 13/36  brain]
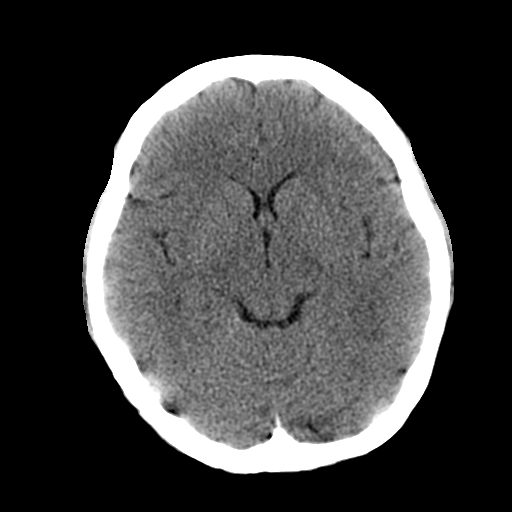
[im 15/36  brain]
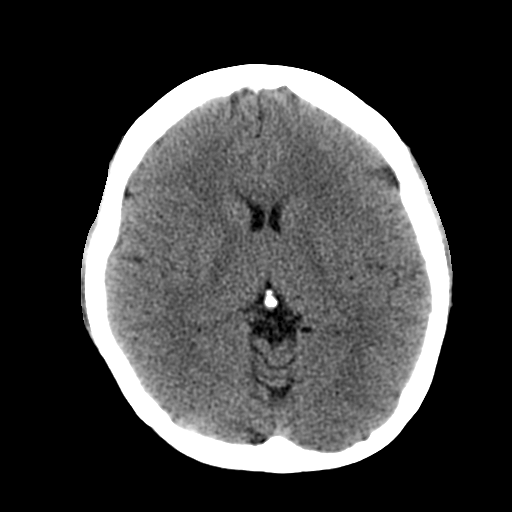
[im 17/36  brain]
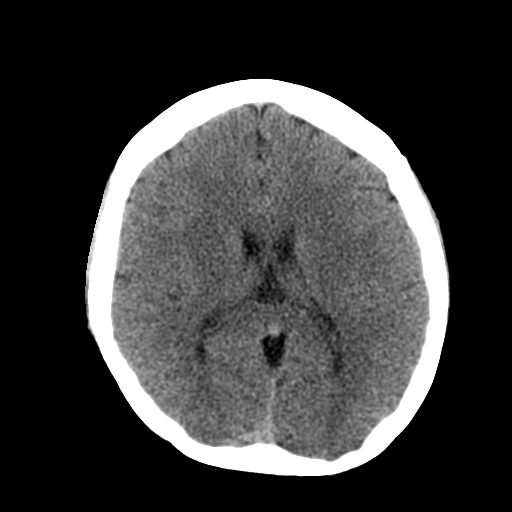
[im 19/36  brain]
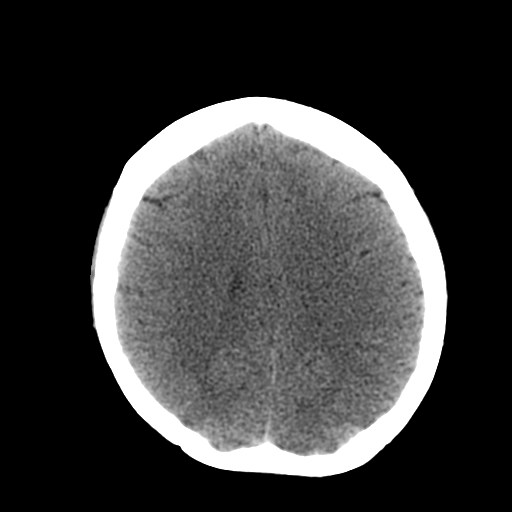
[im 19/36  bone]
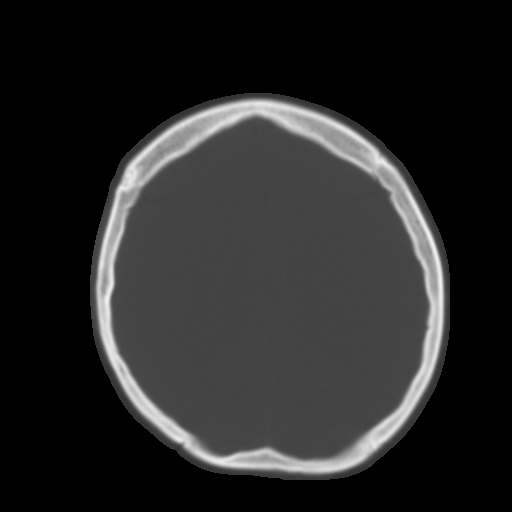
[im 21/36  brain]
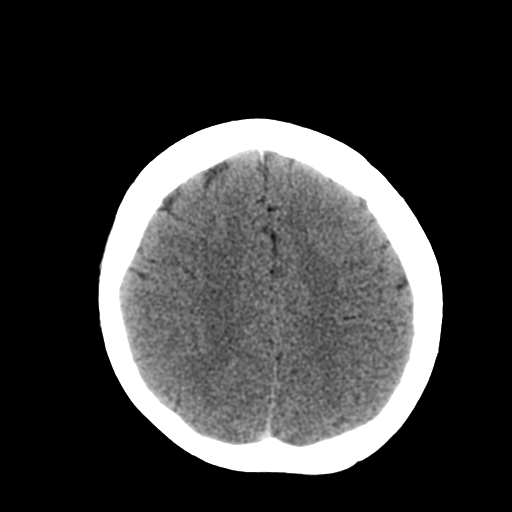
[im 23/36  brain]
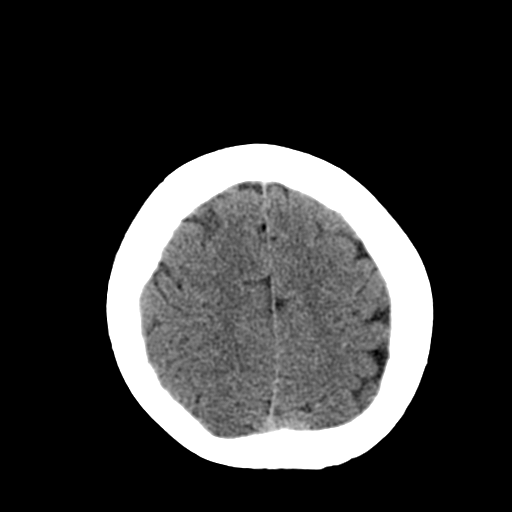
[im 26/36  brain]
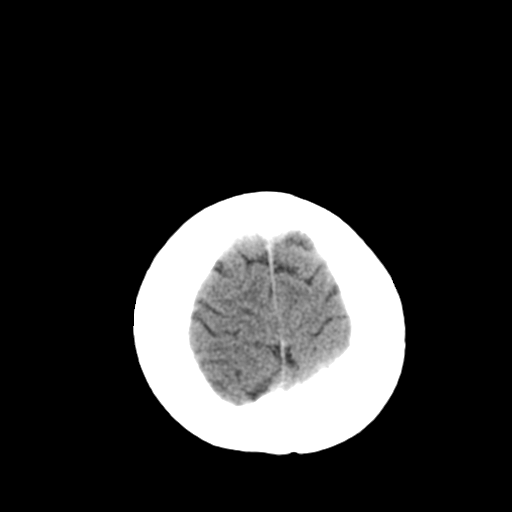
[im 27/36  brain]
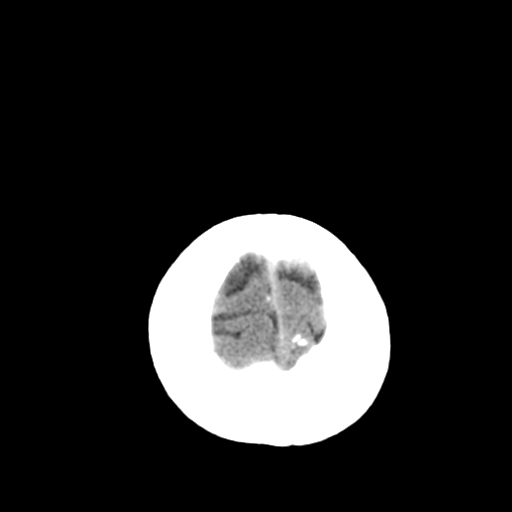
[im 27/36  bone]
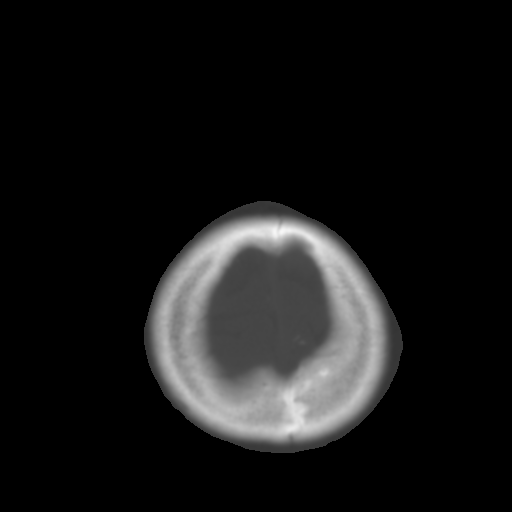
[im 29/36  brain]
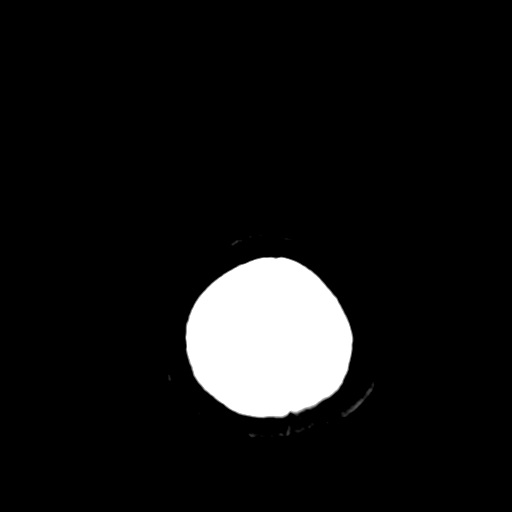
[im 32/36  brain]
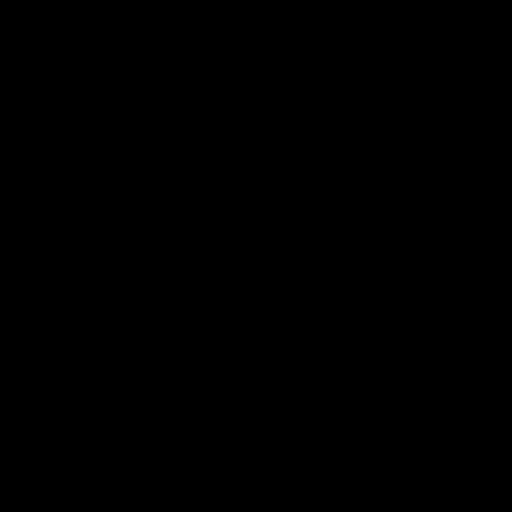
[im 34/36  brain]
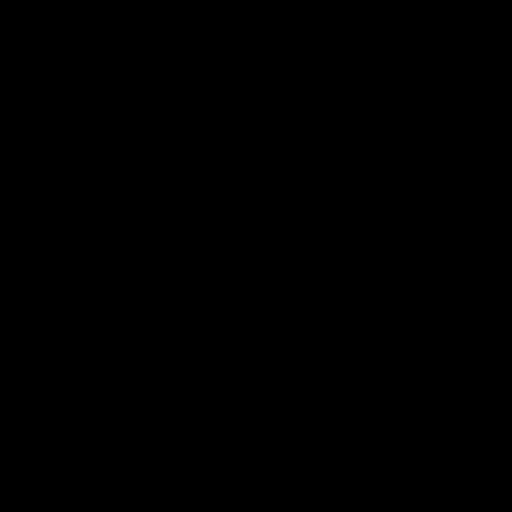

[16 of 30 positions shown; findings below may reference images not displayed]

FINDINGS: Ventricles are normal in size and configuration.  There
is no mass, hemorrhage, extra-axial fluid collection, or midline
shift.  Gray-white compartments are normal. There is no
demonstrable acute infarct.  Bony calvarium appears intact.  The
mastoid air cells are clear.
IMPRESSION: Study within normal limits.

## 2013-05-11 MED ORDER — METOCLOPRAMIDE HCL 5 MG/ML IJ SOLN
10.0000 mg | Freq: Once | INTRAMUSCULAR | Status: AC
  Administered 2013-05-11: 10 mg via INTRAVENOUS
  Filled 2013-05-11: qty 2

## 2013-05-11 MED ORDER — DEXAMETHASONE SODIUM PHOSPHATE 4 MG/ML IJ SOLN
10.0000 mg | Freq: Once | INTRAMUSCULAR | Status: AC
  Administered 2013-05-11: 10 mg via INTRAVENOUS
  Filled 2013-05-11: qty 3

## 2013-05-11 MED ORDER — HYDROMORPHONE HCL PF 1 MG/ML IJ SOLN
1.0000 mg | Freq: Once | INTRAMUSCULAR | Status: AC
  Administered 2013-05-11: 1 mg via INTRAVENOUS
  Filled 2013-05-11: qty 1

## 2013-05-11 MED ORDER — SODIUM CHLORIDE 0.9 % IV SOLN
INTRAVENOUS | Status: DC
  Administered 2013-05-11: 16:00:00 via INTRAVENOUS

## 2013-05-11 MED ORDER — DIPHENHYDRAMINE HCL 50 MG/ML IJ SOLN
25.0000 mg | Freq: Once | INTRAMUSCULAR | Status: AC
  Administered 2013-05-11: 25 mg via INTRAVENOUS
  Filled 2013-05-11: qty 1

## 2013-05-11 NOTE — ED Notes (Signed)
Pt c/o headache that started last night, left hip pain that started three days ago, has hx of migraine headaches, admits to nausea, denies any vomiting, dizziness.

## 2013-05-11 NOTE — ED Provider Notes (Signed)
CSN: [id number]     Arrival date & time 05/11/13  1429 History     First MD Initiated Contact with Patient 05/11/13 1459     Chief Complaint  Patient presents with  . Headache  . Hip Pain   (Consider location/radiation/quality/duration/timing/severity/associated sxs/prior Treatment) Patient is a 42 y.o. female presenting with headaches and hip pain. The history is provided by the patient.  Headache Pain location:  Generalized Quality:  Sharp and stabbing Radiates to:  Does not radiate Severity currently:  10/10 (worse than any headache ever) Severity at highest:  10/10 Onset quality:  Gradual Duration:  24 hours Timing:  Constant Progression:  Worsening Chronicity:  Recurrent Similar to prior headaches: similar but never this bad.   Context: bright light and loud noise   Relieved by:  Nothing Worsened by:  Activity, light, neck movement and sound Ineffective treatments:  Acetaminophen (Aleve) Associated symptoms: congestion, nausea and photophobia   Associated symptoms: no abdominal pain, no back pain, no blurred vision, no fever, no focal weakness, no loss of balance, no myalgias, no neck pain, no neck stiffness, no numbness, no seizures, no sinus pressure, no sore throat, no swollen glands, no syncope, no tingling, no vomiting and no weakness   Hip Pain Associated symptoms include congestion, headaches and nausea. Pertinent negatives include no abdominal pain, fever, myalgias, neck pain, numbness, sore throat, swollen glands or vomiting.   Beverly Tran is a 42 y.o. female who presents to the ED with a headache. She has had migraines in the past but this headache is worse. She has nausea but no vomiting. Also complains of left hip pain that started 3 days ago. Pain increases with ambulation and pressure.   Past Medical History  Diagnosis Date  . Migraine   . Back pain, chronic   . Hypertension   . High cholesterol   . Depression   . Anxiety   . Asthma    Past  Surgical History  Procedure Laterality Date  . Cesarean section    . Tubal ligation    . Cholecystectomy     No family history on file. History  Substance Use Topics  . Smoking status: Former Games developer  . Smokeless tobacco: Not on file  . Alcohol Use: No   OB History   Grav Para Term Preterm Abortions TAB SAB Ect Mult Living                 Review of Systems  Constitutional: Negative for fever.  HENT: Positive for congestion. Negative for sore throat, neck pain, neck stiffness and sinus pressure.   Eyes: Positive for photophobia. Negative for blurred vision.  Cardiovascular: Negative for syncope.  Gastrointestinal: Positive for nausea. Negative for vomiting and abdominal pain.  Musculoskeletal: Negative for myalgias and back pain.  Neurological: Positive for headaches. Negative for focal weakness, seizures, numbness and loss of balance.    Allergies  Salami; Aspirin; Bee venom; Compazine; Imitrex; Nubain; Thorazine; Voltaren; and Zofran  Home Medications   Current Outpatient Rx  Name  Route  Sig  Dispense  Refill  . acetaminophen (TYLENOL) 500 MG tablet   Oral   Take 500-1,000 mg by mouth every 6 (six) hours as needed for pain.         Marland Kitchen albuterol (PROVENTIL HFA;VENTOLIN HFA) 108 (90 BASE) MCG/ACT inhaler   Inhalation   Inhale 2 puffs into the lungs every 6 (six) hours as needed for wheezing or shortness of breath.          Marland Kitchen  metoCLOPramide (REGLAN) 10 MG tablet   Oral   Take 1 tablet (10 mg total) by mouth every 6 (six) hours as needed (nausea/headache).   6 tablet   0   . promethazine (PHENERGAN) 25 MG tablet   Oral   Take 1 tablet (25 mg total) by mouth every 6 (six) hours as needed for nausea.   10 tablet   0    BP 136/62  Pulse 96  Temp(Src) 98.3 F (36.8 C) (Oral)  Resp 16  SpO2 98%  LMP 04/26/2013 Physical Exam  Nursing note and vitals reviewed. Constitutional: She is oriented to person, place, and time. She appears well-developed and  well-nourished. No distress.  HENT:  Head: Normocephalic.  Eyes: Conjunctivae and EOM are normal. Pupils are equal, round, and reactive to light.  Neck: Normal range of motion. Neck supple.  Cardiovascular: Normal rate and normal heart sounds.   Pulmonary/Chest: Effort normal. She has wheezes.  Abdominal: Soft. Bowel sounds are normal. There is no tenderness.  Musculoskeletal: Normal range of motion.  Tender left buttock at sciatic nerve. Pedal pulses present and equal, adequate circulation, good touch sensation.  Neurological: She is alert and oriented to person, place, and time. She has normal strength and normal reflexes. No cranial nerve deficit or sensory deficit. She displays a negative Romberg sign. Coordination and gait normal.  Stands on one foot without out difficulty. Steady gait without difficulty.   Skin: Skin is warm and dry.  Psychiatric: She has a normal mood and affect. Her behavior is normal.    ED Course  Ct Head Wo Contrast  05/11/2013   *RADIOLOGY REPORT*  Clinical Data: Severe headache with nausea  CT HEAD WITHOUT CONTRAST  Technique:  Contiguous axial images were obtained from the base of the skull through the vertex without contrast. Study was obtained within 24 hours of patient arrival at the emergency department.  Comparison: May 28, 2004  Findings: Ventricles are normal in size and configuration.  There is no mass, hemorrhage, extra-axial fluid collection, or midline shift.  Gray-white compartments are normal. There is no demonstrable acute infarct.  Bony calvarium appears intact.  The mastoid air cells are clear.  IMPRESSION:   Study within normal limits.   Original Report Authenticated By: Bretta Bang, M.D.    Procedures IV hydration, Reglan, decadron, benadryl and patient felt better but still some headache. Dilaudid 1 mg. IV and patient's pain completely resolved. MDM  42 y.o. female with headache and left hip pain.  CT scan normal. Patient ambulatory on  discharge without pain. She has an appoint next week with Dr. Gerilyn Pilgrim for her chronic pain. She will return here as needed.  I have reviewed this patient's vital signs, nurses notes, appropriate labs and imaging.  I have discussed findings with the patient and plan of care . Patient voices understanding.   [place], [place] 05/11/13 1654

## 2013-05-11 NOTE — ED Notes (Signed)
Pt c/o headache that begin last night as well as left hip pain x3 days. Pt denies injury to hip and states pain has progressively worsened. Pt has an appointment with Dr. Gerilyn Pilgrim for her headaches August 4th.

## 2013-05-11 NOTE — ED Provider Notes (Signed)
Medical screening examination/treatment/procedure(s) were performed by non-physician practitioner and as supervising physician I was immediately available for consultation/collaboration.   Ivory Bail III, MD 05/11/13 1916 

## 2013-07-16 ENCOUNTER — Emergency Department (HOSPITAL_COMMUNITY)
Admission: EM | Admit: 2013-07-16 | Discharge: 2013-07-17 | Disposition: A | Discharge status: Home / Self Care | Payer: Self-pay | Attending: Emergency Medicine | Admitting: Emergency Medicine

## 2013-07-16 ENCOUNTER — Encounter (HOSPITAL_COMMUNITY): Payer: Self-pay | Admitting: *Deleted

## 2013-07-16 DIAGNOSIS — R51 Headache: Secondary | ICD-10-CM | POA: Insufficient documentation

## 2013-07-16 DIAGNOSIS — I10 Essential (primary) hypertension: Secondary | ICD-10-CM | POA: Insufficient documentation

## 2013-07-16 DIAGNOSIS — Z862 Personal history of diseases of the blood and blood-forming organs and certain disorders involving the immune mechanism: Secondary | ICD-10-CM | POA: Insufficient documentation

## 2013-07-16 DIAGNOSIS — M549 Dorsalgia, unspecified: Secondary | ICD-10-CM | POA: Insufficient documentation

## 2013-07-16 DIAGNOSIS — R110 Nausea: Secondary | ICD-10-CM | POA: Insufficient documentation

## 2013-07-16 DIAGNOSIS — G8929 Other chronic pain: Secondary | ICD-10-CM | POA: Insufficient documentation

## 2013-07-16 DIAGNOSIS — Z87891 Personal history of nicotine dependence: Secondary | ICD-10-CM | POA: Insufficient documentation

## 2013-07-16 DIAGNOSIS — Z8659 Personal history of other mental and behavioral disorders: Secondary | ICD-10-CM | POA: Insufficient documentation

## 2013-07-16 DIAGNOSIS — Z8639 Personal history of other endocrine, nutritional and metabolic disease: Secondary | ICD-10-CM | POA: Insufficient documentation

## 2013-07-16 DIAGNOSIS — J45909 Unspecified asthma, uncomplicated: Secondary | ICD-10-CM | POA: Insufficient documentation

## 2013-07-16 DIAGNOSIS — J3489 Other specified disorders of nose and nasal sinuses: Secondary | ICD-10-CM | POA: Insufficient documentation

## 2013-07-16 NOTE — ED Notes (Signed)
PA has been in to evaluate

## 2013-07-16 NOTE — ED Notes (Signed)
Headache since 1 pm .  Cold symptoms for 1 week

## 2013-07-17 MED ORDER — HYDROCODONE-ACETAMINOPHEN 5-325 MG PO TABS
2.0000 | ORAL_TABLET | Freq: Once | ORAL | Status: AC
  Administered 2013-07-17: 2 via ORAL
  Filled 2013-07-17: qty 2

## 2013-07-17 MED ORDER — DIPHENHYDRAMINE HCL 12.5 MG/5ML PO ELIX
12.5000 mg | ORAL_SOLUTION | Freq: Once | ORAL | Status: AC
  Administered 2013-07-17: 12.5 mg via ORAL
  Filled 2013-07-17: qty 5

## 2013-07-17 MED ORDER — DEXAMETHASONE SODIUM PHOSPHATE 4 MG/ML IJ SOLN
8.0000 mg | Freq: Once | INTRAMUSCULAR | Status: AC
  Administered 2013-07-17: 8 mg via INTRAMUSCULAR
  Filled 2013-07-17: qty 2

## 2013-07-17 MED ORDER — PROMETHAZINE HCL 25 MG PO TABS: 25.0000 mg | ORAL_TABLET | Freq: Four times a day (QID) | ORAL | Status: DC | PRN

## 2013-07-17 MED ORDER — HYDROCODONE-ACETAMINOPHEN 5-325 MG PO TABS: 1.0000 | ORAL_TABLET | ORAL | Status: DC | PRN

## 2013-07-17 MED ORDER — PROMETHAZINE HCL 12.5 MG PO TABS
12.5000 mg | ORAL_TABLET | Freq: Once | ORAL | Status: AC
  Administered 2013-07-17: 12.5 mg via ORAL
  Filled 2013-07-17: qty 1

## 2013-07-17 NOTE — ED Notes (Signed)
Patient ambulated unassisted back to the bathroom.

## 2013-07-17 NOTE — ED Provider Notes (Signed)
CSN: [id number]     Arrival date & time 07/16/13  2212 History   First MD Initiated Contact with Patient 07/16/13 2232     Chief Complaint  Patient presents with  . Headache   (Consider location/radiation/quality/duration/timing/severity/associated sxs/prior Treatment) Patient is a 42 y.o. female presenting with headaches. The history is provided by the patient.  Headache Pain location:  L temporal and R temporal Quality:  Dull Radiates to:  Upper back Severity currently:  8/10 Onset quality:  Gradual Duration:  1 hour Timing:  Constant Progression:  Unchanged Similar to prior headaches: yes   Context: coughing   Relieved by:  Nothing Worsened by:  Nothing tried Ineffective treatments:  Acetaminophen and NSAIDs Associated symptoms: back pain, drainage and nausea   Associated symptoms: no fever, no neck pain, no photophobia, no sinus pressure, no sore throat, no syncope and no vomiting     Past Medical History  Diagnosis Date  . Migraine   . Back pain, chronic   . Hypertension   . High cholesterol   . Depression   . Anxiety   . Asthma    Past Surgical History  Procedure Laterality Date  . Cesarean section    . Tubal ligation    . Cholecystectomy     History reviewed. No pertinent family history. History  Substance Use Topics  . Smoking status: Former Games developer  . Smokeless tobacco: Not on file  . Alcohol Use: No   OB History   Grav Para Term Preterm Abortions TAB SAB Ect Mult Living                 Review of Systems  Constitutional: Negative for fever.  HENT: Positive for postnasal drip. Negative for sore throat, neck pain and sinus pressure.   Eyes: Negative for photophobia.  Cardiovascular: Negative for syncope.  Gastrointestinal: Positive for nausea. Negative for vomiting.  Musculoskeletal: Positive for back pain.  Neurological: Positive for headaches.  Psychiatric/Behavioral: The patient is nervous/anxious.        Depression    Allergies  Salami;  Aspirin; Bee venom; Compazine; Imitrex; Nubain; Thorazine; Voltaren; and Zofran  Home Medications   Current Outpatient Rx  Name  Route  Sig  Dispense  Refill  . acetaminophen (TYLENOL) 500 MG tablet   Oral   Take 500-1,000 mg by mouth every 6 (six) hours as needed for pain.         Marland Kitchen albuterol (PROVENTIL HFA;VENTOLIN HFA) 108 (90 BASE) MCG/ACT inhaler   Inhalation   Inhale 2 puffs into the lungs every 6 (six) hours as needed for wheezing or shortness of breath.           BP 148/90  Pulse 103  Temp(Src) 98.8 F (37.1 C) (Oral)  Resp 18  Ht 5\' 5"  (1.651 m)  Wt 201 lb (91.173 kg)  BMI 33.45 kg/m2  SpO2 97%  LMP 06/25/2013 Physical Exam  Nursing note and vitals reviewed. Constitutional: She is oriented to person, place, and time. She appears well-developed and well-nourished.  Non-toxic appearance.  HENT:  Head: Normocephalic.  Right Ear: Tympanic membrane and external ear normal.  Left Ear: Tympanic membrane and external ear normal.  Eyes: EOM and lids are normal. Pupils are equal, round, and reactive to light.  Neck: Normal range of motion. Neck supple. Carotid bruit is not present.  Cardiovascular: Normal rate, regular rhythm, normal heart sounds, intact distal pulses and normal pulses.   Pulmonary/Chest: Breath sounds normal. No respiratory distress.  Abdominal: Soft. Bowel sounds  are normal. There is no tenderness. There is no guarding.  Musculoskeletal: Normal range of motion.  Lymphadenopathy:       Head (right side): No submandibular adenopathy present.       Head (left side): No submandibular adenopathy present.    She has no cervical adenopathy.  Neurological: She is alert and oriented to person, place, and time. She has normal strength. No cranial nerve deficit or sensory deficit.  Skin: Skin is warm and dry.  Psychiatric: She has a normal mood and affect. Her speech is normal.    ED Course  Procedures (including critical care time) Labs Review Labs  Reviewed - No data to display Imaging Review No results found.  MDM  No diagnosis found. **I have reviewed nursing notes, vital signs, and all appropriate lab and imaging results for this patient.*  No gross neuro deficit. Vital sign reviewed. Pt ambulatory without problem. Speaks in complete sentences. Plan - norco and promethazine Rx given to the patient. She will follow up with PCP if not improving.  Kathie Dike, PA-C 07/17/13 1512

## 2013-07-18 NOTE — ED Provider Notes (Signed)
Medical screening examination/treatment/procedure(s) were performed by non-physician practitioner and as supervising physician I was immediately available for consultation/collaboration.   Benny Lennert, MD 07/18/13 [phone]

## 2013-07-21 ENCOUNTER — Emergency Department (HOSPITAL_COMMUNITY)
Admission: EM | Admit: 2013-07-21 | Discharge: 2013-07-21 | Disposition: A | Discharge status: Home / Self Care | Payer: Self-pay | Attending: Emergency Medicine | Admitting: Emergency Medicine

## 2013-07-21 ENCOUNTER — Encounter (HOSPITAL_COMMUNITY): Payer: Self-pay | Admitting: Emergency Medicine

## 2013-07-21 DIAGNOSIS — I10 Essential (primary) hypertension: Secondary | ICD-10-CM | POA: Insufficient documentation

## 2013-07-21 DIAGNOSIS — J0100 Acute maxillary sinusitis, unspecified: Secondary | ICD-10-CM | POA: Insufficient documentation

## 2013-07-21 DIAGNOSIS — R51 Headache: Secondary | ICD-10-CM | POA: Insufficient documentation

## 2013-07-21 DIAGNOSIS — Z8639 Personal history of other endocrine, nutritional and metabolic disease: Secondary | ICD-10-CM | POA: Insufficient documentation

## 2013-07-21 DIAGNOSIS — Z87891 Personal history of nicotine dependence: Secondary | ICD-10-CM | POA: Insufficient documentation

## 2013-07-21 DIAGNOSIS — Z8659 Personal history of other mental and behavioral disorders: Secondary | ICD-10-CM | POA: Insufficient documentation

## 2013-07-21 DIAGNOSIS — J40 Bronchitis, not specified as acute or chronic: Secondary | ICD-10-CM

## 2013-07-21 DIAGNOSIS — Z79899 Other long term (current) drug therapy: Secondary | ICD-10-CM | POA: Insufficient documentation

## 2013-07-21 DIAGNOSIS — J45901 Unspecified asthma with (acute) exacerbation: Secondary | ICD-10-CM | POA: Insufficient documentation

## 2013-07-21 DIAGNOSIS — Z8679 Personal history of other diseases of the circulatory system: Secondary | ICD-10-CM | POA: Insufficient documentation

## 2013-07-21 DIAGNOSIS — G8929 Other chronic pain: Secondary | ICD-10-CM | POA: Insufficient documentation

## 2013-07-21 DIAGNOSIS — Z862 Personal history of diseases of the blood and blood-forming organs and certain disorders involving the immune mechanism: Secondary | ICD-10-CM | POA: Insufficient documentation

## 2013-07-21 MED ORDER — PHENYLEPH-PROMETHAZINE-COD 5-6.25-10 MG/5ML PO SYRP: 5.0000 mL | ORAL_SOLUTION | ORAL | Status: DC | PRN

## 2013-07-21 MED ORDER — AMOXICILLIN 500 MG PO CAPS: 500.0000 mg | ORAL_CAPSULE | Freq: Three times a day (TID) | ORAL | Status: DC

## 2013-07-21 NOTE — ED Provider Notes (Signed)
CSN: [id number]     Arrival date & time 07/21/13  1756 History   First MD Initiated Contact with Patient 07/21/13 1812     Chief Complaint  Patient presents with  . Nasal Congestion  . Cough  . Headache   (Consider location/radiation/quality/duration/timing/severity/associated sxs/prior Treatment) Patient is a 42 y.o. female presenting with cough and headaches. The history is provided by the patient.  Cough Cough characteristics:  Non-productive and hacking Duration:  4 days Timing:  Sporadic Progression:  Worsening Chronicity:  New Smoker: no   Relieved by:  Nothing Worsened by:  Lying down Ineffective treatments:  Steam, decongestant, cough suppressants and steroid inhaler Associated symptoms: ear pain, headaches, myalgias, sinus congestion, sore throat and wheezing   Associated symptoms: no chest pain, no chills, no diaphoresis, no ear fullness, no fever, no rash and no shortness of breath   Headache Associated symptoms: cough, ear pain, myalgias and sore throat   Associated symptoms: no abdominal pain, no back pain, no pain, no fever, no nausea and no vomiting    Beverly Tran is a 42 y.o. female who presents to the ED with cough and congestion x 3 days.  Past Medical History  Diagnosis Date  . Migraine   . Back pain, chronic   . Hypertension   . High cholesterol   . Depression   . Anxiety   . Asthma    Past Surgical History  Procedure Laterality Date  . Cesarean section    . Tubal ligation    . Cholecystectomy     Family History  Problem Relation Age of Onset  . Seizures Mother   . Heart failure Mother   . Heart failure Father    History  Substance Use Topics  . Smoking status: Former Smoker -- 1.00 packs/day for 7 years    Types: Cigarettes    Quit date: 07/17/2012  . Smokeless tobacco: Never Used  . Alcohol Use: No   OB History   Grav Para Term Preterm Abortions TAB SAB Ect Mult Living   3 3 3       3      Review of Systems  Constitutional:  Negative for fever, chills and diaphoresis.  HENT: Positive for ear pain and sore throat.        Facial pain   Eyes: Negative for pain, redness and itching.  Respiratory: Positive for cough and wheezing. Negative for shortness of breath.   Cardiovascular: Negative for chest pain.  Gastrointestinal: Negative for nausea, vomiting and abdominal pain.  Genitourinary: Negative for dysuria, urgency and frequency.  Musculoskeletal: Positive for myalgias. Negative for back pain.  Skin: Negative for rash.  Allergic/Immunologic: Negative for immunocompromised state.  Neurological: Positive for headaches.  Psychiatric/Behavioral: The patient is not nervous/anxious.     Allergies  Salami; Aspirin; Bee venom; Compazine; Imitrex; Nubain; Thorazine; Voltaren; and Zofran  Home Medications   Current Outpatient Rx  Name  Route  Sig  Dispense  Refill  . acetaminophen (TYLENOL) 500 MG tablet   Oral   Take 500-1,000 mg by mouth every 6 (six) hours as needed for pain.         Marland Kitchen albuterol (PROVENTIL HFA;VENTOLIN HFA) 108 (90 BASE) MCG/ACT inhaler   Inhalation   Inhale 2 puffs into the lungs every 6 (six) hours as needed for wheezing or shortness of breath.          Marland Kitchen HYDROcodone-acetaminophen (NORCO/VICODIN) 5-325 MG per tablet   Oral   Take 1 tablet by mouth every  4 (four) hours as needed for pain.   15 tablet   0   . promethazine (PHENERGAN) 25 MG tablet   Oral   Take 1 tablet (25 mg total) by mouth every 6 (six) hours as needed for nausea.   30 tablet   0    BP 126/66  Pulse 92  Temp(Src) 98.3 F (36.8 C) (Oral)  Resp 16  Ht 5\' 5"  (1.651 m)  Wt 201 lb (91.173 kg)  BMI 33.45 kg/m2  SpO2 96%  LMP 07/19/2013 Physical Exam  Nursing note and vitals reviewed. Constitutional: She is oriented to person, place, and time. She appears well-developed and well-nourished.  HENT:  Head: Normocephalic and atraumatic.  Right Ear: Tympanic membrane is erythematous.  Left Ear: Tympanic  membrane normal.  Nose: Right sinus exhibits maxillary sinus tenderness. Left sinus exhibits maxillary sinus tenderness.  Mouth/Throat: Uvula is midline and mucous membranes are normal. Posterior oropharyngeal erythema present.  Eyes: EOM are normal.  Neck: Neck supple.  Cardiovascular: Normal rate, regular rhythm and normal heart sounds.   Pulmonary/Chest: Effort normal. She has wheezes.  Abdominal: Soft. There is no tenderness.  Musculoskeletal: Normal range of motion.  Neurological: She is alert and oriented to person, place, and time. No cranial nerve deficit.  Skin: Skin is warm and dry.  Psychiatric: She has a normal mood and affect. Her behavior is normal.    ED Course  Procedures   MDM  42 y.o. female with cough, congestion, sinus tenderness and ear pain x 3 days. Will treat for sinusitis and bronchitis. She will follow up with her PCP or return here as needed. She is stable for discharge home without any immediate complications.  Discussed with the patient clinical findings and plan of care and all questioned fully answered.    Medication List    TAKE these medications       amoxicillin 500 MG capsule  Commonly known as:  AMOXIL  Take 1 capsule (500 mg total) by mouth 3 (three) times daily.     Phenyleph-Promethazine-Cod 5-6.25-10 MG/5ML Syrp  Take 5 mLs by mouth every 4 (four) hours as needed.      ASK your doctor about these medications       acetaminophen 500 MG tablet  Commonly known as:  TYLENOL  Take 500-1,000 mg by mouth every 6 (six) hours as needed for pain.     albuterol 108 (90 BASE) MCG/ACT inhaler  Commonly known as:  PROVENTIL HFA;VENTOLIN HFA  Inhale 2 puffs into the lungs every 6 (six) hours as needed for wheezing or shortness of breath.     HYDROcodone-acetaminophen 5-325 MG per tablet  Commonly known as:  NORCO/VICODIN  Take 1 tablet by mouth every 4 (four) hours as needed for pain.     promethazine 25 MG tablet  Commonly known as:  PHENERGAN   Take 1 tablet (25 mg total) by mouth every 6 (six) hours as needed for nausea.           [place], [place] 07/23/13 [phone]

## 2013-07-21 NOTE — ED Notes (Signed)
Patient c/o headache, congestion, and dry cough x3 days. Denies any fevers. Patient reports Daytime alka seltzer with no relief.

## 2013-07-24 ENCOUNTER — Emergency Department (HOSPITAL_COMMUNITY): Payer: Self-pay

## 2013-07-24 ENCOUNTER — Emergency Department (HOSPITAL_COMMUNITY)
Admission: EM | Admit: 2013-07-24 | Discharge: 2013-07-24 | Disposition: A | Discharge status: Home / Self Care | Payer: Self-pay | Attending: Emergency Medicine | Admitting: Emergency Medicine

## 2013-07-24 ENCOUNTER — Encounter (HOSPITAL_COMMUNITY): Payer: Self-pay | Admitting: Emergency Medicine

## 2013-07-24 DIAGNOSIS — I10 Essential (primary) hypertension: Secondary | ICD-10-CM | POA: Insufficient documentation

## 2013-07-24 DIAGNOSIS — Z8639 Personal history of other endocrine, nutritional and metabolic disease: Secondary | ICD-10-CM | POA: Insufficient documentation

## 2013-07-24 DIAGNOSIS — Z862 Personal history of diseases of the blood and blood-forming organs and certain disorders involving the immune mechanism: Secondary | ICD-10-CM | POA: Insufficient documentation

## 2013-07-24 DIAGNOSIS — R51 Headache: Secondary | ICD-10-CM | POA: Insufficient documentation

## 2013-07-24 DIAGNOSIS — Z792 Long term (current) use of antibiotics: Secondary | ICD-10-CM | POA: Insufficient documentation

## 2013-07-24 DIAGNOSIS — J45909 Unspecified asthma, uncomplicated: Secondary | ICD-10-CM | POA: Insufficient documentation

## 2013-07-24 DIAGNOSIS — Z87891 Personal history of nicotine dependence: Secondary | ICD-10-CM | POA: Insufficient documentation

## 2013-07-24 DIAGNOSIS — R519 Headache, unspecified: Secondary | ICD-10-CM

## 2013-07-24 DIAGNOSIS — G8929 Other chronic pain: Secondary | ICD-10-CM | POA: Insufficient documentation

## 2013-07-24 DIAGNOSIS — Z8659 Personal history of other mental and behavioral disorders: Secondary | ICD-10-CM | POA: Insufficient documentation

## 2013-07-24 DIAGNOSIS — M79609 Pain in unspecified limb: Secondary | ICD-10-CM | POA: Insufficient documentation

## 2013-07-24 IMAGING — CR DG FEMUR 2+V*R*
4 series · 4 of 4 positions shown · non-contrast
Comparison: None.

CLINICAL DATA: Right femur pain

EXAM:
RIGHT FEMUR - 2 VIEW

[view not recorded (1 of 4)]
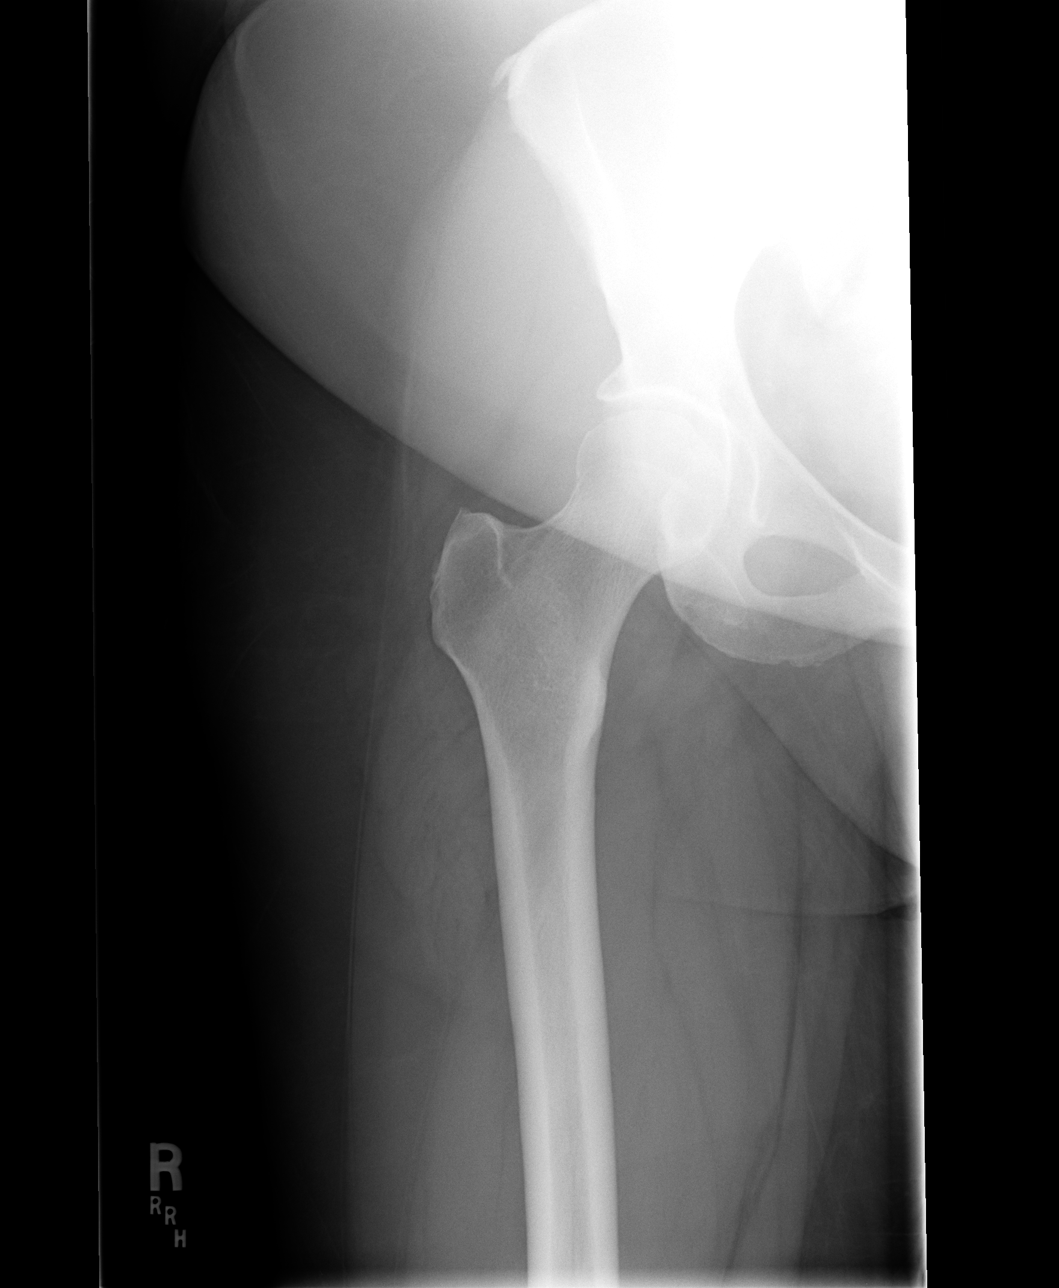

[view not recorded (2 of 4)]
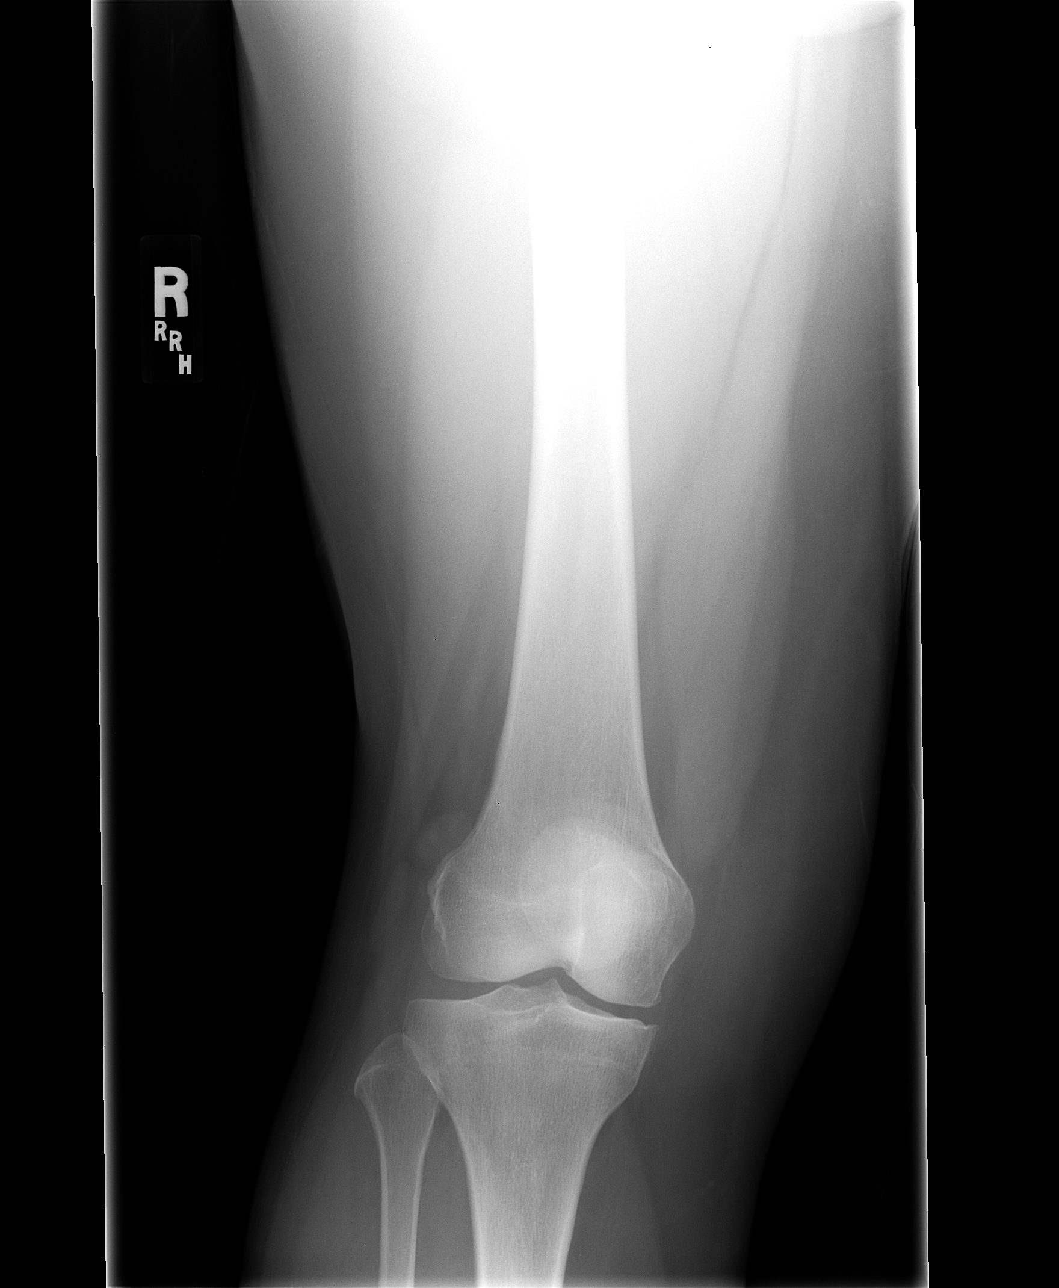

[view not recorded (3 of 4)]
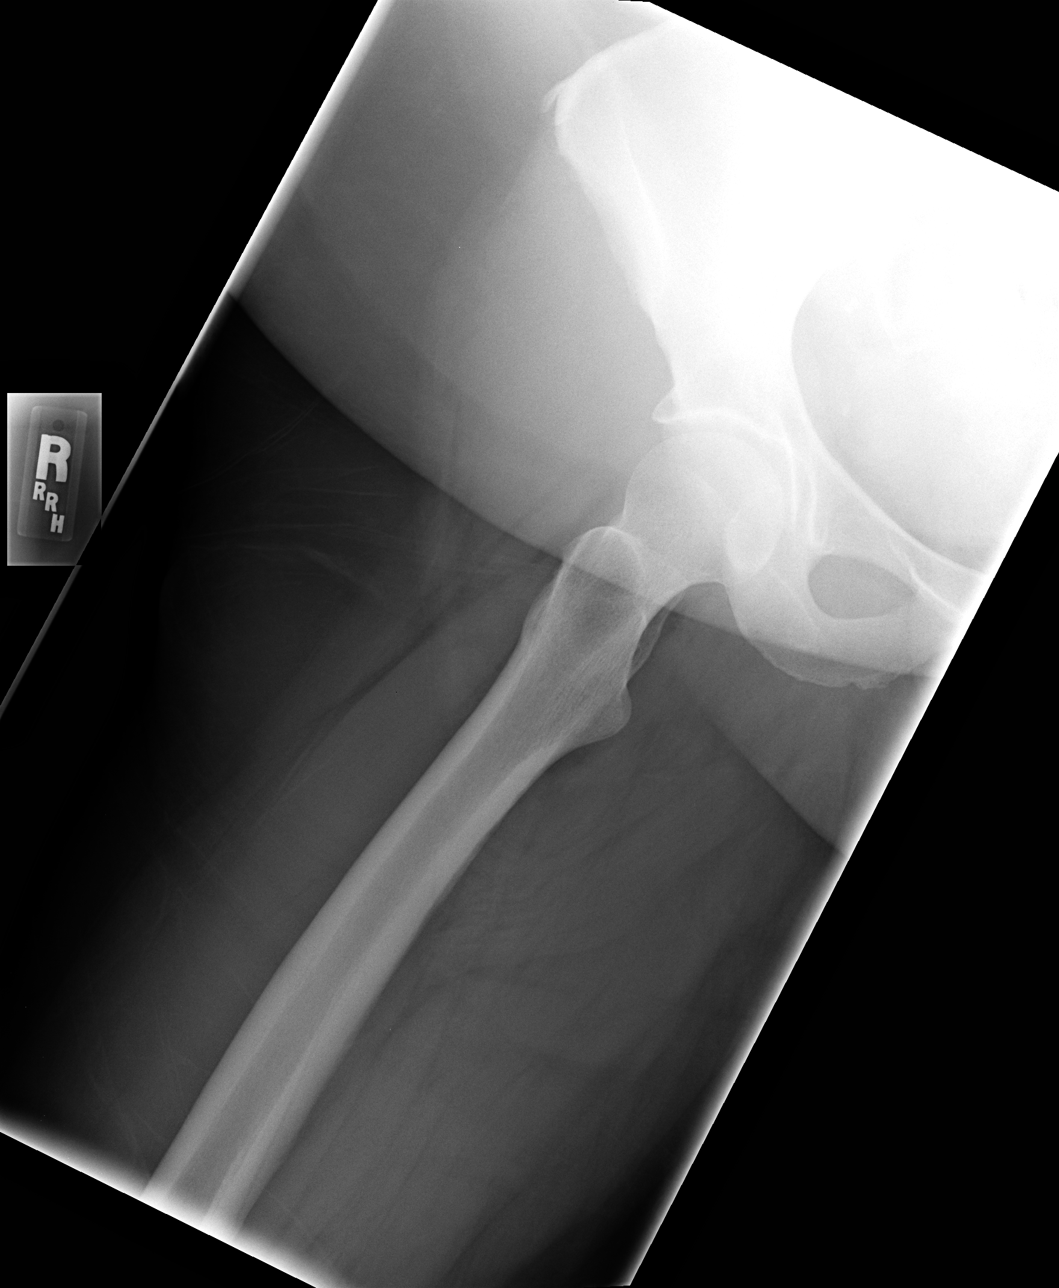

[view not recorded (4 of 4)]
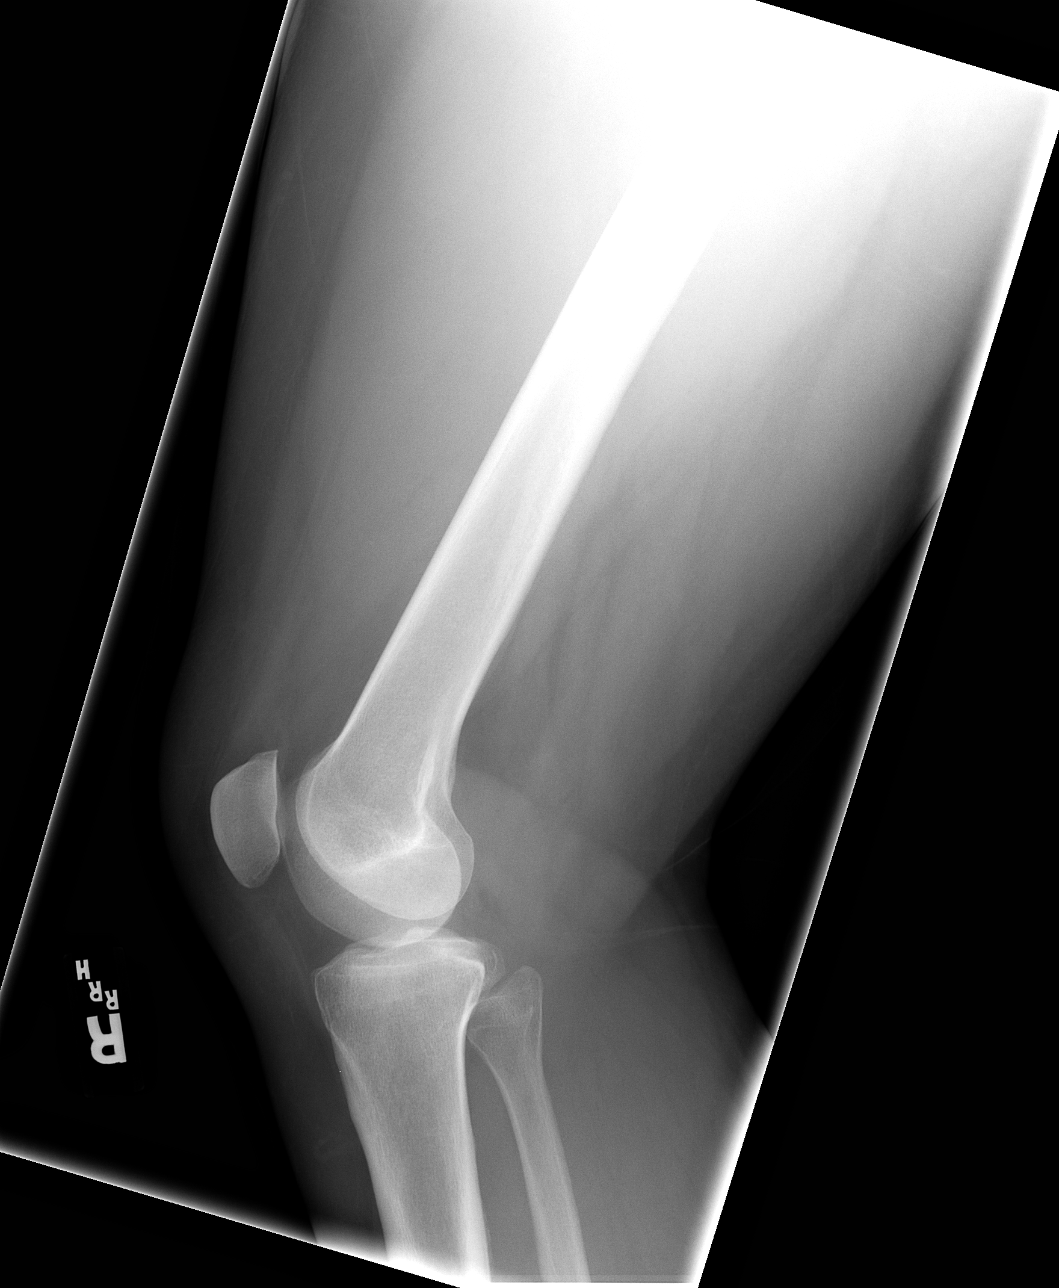

[4 of 4 positions shown; findings below may reference images not displayed]

FINDINGS: There is no evidence of fracture or other focal bone lesions. Soft
tissues are unremarkable.
IMPRESSION: Negative.

## 2013-07-24 MED ORDER — DIPHENHYDRAMINE HCL 50 MG/ML IJ SOLN
25.0000 mg | Freq: Once | INTRAMUSCULAR | Status: AC
  Administered 2013-07-24: 25 mg via INTRAVENOUS
  Filled 2013-07-24: qty 1

## 2013-07-24 MED ORDER — METOCLOPRAMIDE HCL 5 MG/ML IJ SOLN
10.0000 mg | Freq: Once | INTRAMUSCULAR | Status: AC
  Administered 2013-07-24: 10 mg via INTRAVENOUS
  Filled 2013-07-24: qty 2

## 2013-07-24 MED ORDER — HYDROMORPHONE HCL PF 1 MG/ML IJ SOLN
1.0000 mg | Freq: Once | INTRAMUSCULAR | Status: AC
  Administered 2013-07-24: 1 mg via INTRAVENOUS
  Filled 2013-07-24: qty 1

## 2013-07-24 MED ORDER — TRAMADOL HCL 50 MG PO TABS: 50.0000 mg | ORAL_TABLET | Freq: Three times a day (TID) | ORAL | Status: DC | PRN

## 2013-07-24 NOTE — ED Notes (Addendum)
Pt ambulated to radiology with no distress noted.

## 2013-07-24 NOTE — ED Notes (Signed)
Pt co headache and rt leg pain, headache started 1 day ago, leg pain chronic.

## 2013-07-24 NOTE — ED Provider Notes (Signed)
CSN: [id number]     Arrival date & time 07/24/13  1137 History  This chart was scribed for Benny Lennert, MD by Quintella Reichert, ED scribe.  This patient was seen in room [facility] and the patient's care was started at 12:10 PM.  Chief Complaint  Patient presents with  . Migraine  . Leg Pain    Patient is a 42 y.o. female presenting with migraines. The history is provided by the patient. No language interpreter was used.  Migraine This is a recurrent problem. The current episode started yesterday. The problem occurs constantly. The problem has been gradually worsening. Associated symptoms include headaches. Pertinent negatives include no chest pain and no abdominal pain. Nothing aggravates the symptoms. Nothing relieves the symptoms. She has tried nothing for the symptoms.    HPI Comments: ORINE GOGA is a 42 y.o. female who presents to the Emergency Department complaining of a migraine headache that began last night.  Pt localizes pain to posterior head and bilateral temples and describes it as similar in quality, location and severity to her recurrent migraines.  She normally has relief from migraine cocktails or pain medication injections in ED.  Pt also complains of moderate pain to her right thigh.  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   Past Medical History  Diagnosis Date  . Migraine   . Back pain, chronic   . Hypertension   . High cholesterol   . Depression   . Anxiety   . Asthma     Past Surgical History  Procedure Laterality Date  . Cesarean section    . Tubal ligation    . Cholecystectomy      Family History  Problem Relation Age of Onset  . Seizures Mother   . Heart failure Mother   . Heart failure Father     History  Substance Use Topics  . Smoking status: Former Smoker -- 1.00 packs/day for 7 years    Types: Cigarettes    Quit date: 07/17/2012  . Smokeless tobacco: Never Used  . Alcohol  Use: No    OB History   Grav Para Term Preterm Abortions TAB SAB Ect Mult Living   3 3 3       3       Review of Systems  Constitutional: Negative for appetite change and fatigue.  HENT: Negative for congestion, ear discharge and sinus pressure.   Eyes: Negative for discharge.  Respiratory: Negative for cough.   Cardiovascular: Negative for chest pain.  Gastrointestinal: Negative for abdominal pain and diarrhea.  Genitourinary: Negative for frequency and hematuria.  Musculoskeletal: Negative for back pain.       Right leg pain  Skin: Negative for rash.  Neurological: Positive for headaches. Negative for seizures.  Psychiatric/Behavioral: Negative for hallucinations.     Allergies  Salami; Aspirin; Bee venom; Compazine; Imitrex; Nubain; Thorazine; Voltaren; and Zofran  Home Medications   Current Outpatient Rx  Name  Route  Sig  Dispense  Refill  . acetaminophen (TYLENOL) 500 MG tablet   Oral   Take 500-1,000 mg by mouth every 6 (six) hours as needed for pain.         Marland Kitchen albuterol (PROVENTIL HFA;VENTOLIN HFA) 108 (90 BASE) MCG/ACT inhaler   Inhalation   Inhale 2 puffs into the lungs every 6 (six) hours as needed for wheezing or shortness of breath.          Marland Kitchen amoxicillin (AMOXIL) 500 MG  capsule   Oral   Take 1 capsule (500 mg total) by mouth 3 (three) times daily.   30 capsule   0   . HYDROcodone-acetaminophen (NORCO/VICODIN) 5-325 MG per tablet   Oral   Take 1 tablet by mouth every 4 (four) hours as needed for pain.   15 tablet   0   . Phenyleph-Promethazine-Cod 5-6.25-10 MG/5ML SYRP   Oral   Take 5 mLs by mouth every 4 (four) hours as needed.   120 mL   0   . promethazine (PHENERGAN) 25 MG tablet   Oral   Take 1 tablet (25 mg total) by mouth every 6 (six) hours as needed for nausea.   30 tablet   0    BP 130/88  Pulse 107  Temp(Src) 98.3 F (36.8 C) (Oral)  Resp 18  Ht 5\' 5"  (1.651 m)  Wt 200 lb (90.719 kg)  BMI 33.28 kg/m2  SpO2 99%  LMP  07/19/2013  Physical Exam  Nursing note and vitals reviewed. Constitutional: She is oriented to person, place, and time. She appears well-developed.  HENT:  Head: Normocephalic.  Eyes: Conjunctivae and EOM are normal. No scleral icterus.  Neck: Neck supple. No thyromegaly present.  Cardiovascular: Normal rate and regular rhythm.  Exam reveals no gallop and no friction rub.   No murmur heard. Pulmonary/Chest: No stridor. She has no wheezes. She has no rales. She exhibits no tenderness.  Abdominal: She exhibits no distension. There is no tenderness. There is no rebound.  Musculoskeletal: Normal range of motion. She exhibits tenderness. She exhibits no edema.  Minor tenderness to anterior right thigh  Lymphadenopathy:    She has no cervical adenopathy.  Neurological: She is oriented to person, place, and time. She exhibits normal muscle tone. Coordination normal.  Skin: No rash noted. No erythema.  Psychiatric: She has a normal mood and affect. Her behavior is normal.    ED Course  Procedures (including critical care time)  DIAGNOSTIC STUDIES: Oxygen Saturation is 99% on room air, normal by my interpretation.    COORDINATION OF CARE: 12:13 PM-Discussed treatment plan which includes pain medication and imaging with pt at bedside and pt agreed to plan.    Labs Review Labs Reviewed - No data to display  Imaging Review No results found.  MDM  No diagnosis found.   The chart was scribed for me under my direct supervision.  I personally performed the history, physical, and medical decision making and all procedures in the evaluation of this patient.Benny Lennert, MD 07/24/13 1325

## 2013-07-24 NOTE — ED Notes (Signed)
Pt returns to ED with C/O chronic leg pain secondary to restless leg syndrome  and Chronic migraine treatment per pt. Pt ambulated into treatment room without difficulty noted. Headache started last night and she awoke with said headache. Denies photosensitivity, N/V, vertigo and SOB at this time. Pt is alert and oriented x 4. Skin warm, pink and dry to touch at this time.  NAD noted

## 2013-07-26 NOTE — ED Provider Notes (Signed)
Medical screening examination/treatment/procedure(s) were performed by non-physician practitioner and as supervising physician I was immediately available for consultation/collaboration.  Donnetta Hutching, MD 07/26/13 2220

## 2013-11-20 ENCOUNTER — Encounter (HOSPITAL_COMMUNITY): Payer: Self-pay | Admitting: Emergency Medicine

## 2013-11-20 ENCOUNTER — Emergency Department (HOSPITAL_COMMUNITY)
Admission: EM | Admit: 2013-11-20 | Discharge: 2013-11-20 | Disposition: A | Discharge status: Home / Self Care | Payer: Self-pay | Attending: Emergency Medicine | Admitting: Emergency Medicine

## 2013-11-20 DIAGNOSIS — Z79899 Other long term (current) drug therapy: Secondary | ICD-10-CM | POA: Insufficient documentation

## 2013-11-20 DIAGNOSIS — F329 Major depressive disorder, single episode, unspecified: Secondary | ICD-10-CM | POA: Insufficient documentation

## 2013-11-20 DIAGNOSIS — F411 Generalized anxiety disorder: Secondary | ICD-10-CM | POA: Insufficient documentation

## 2013-11-20 DIAGNOSIS — R519 Headache, unspecified: Secondary | ICD-10-CM

## 2013-11-20 DIAGNOSIS — M25569 Pain in unspecified knee: Secondary | ICD-10-CM | POA: Insufficient documentation

## 2013-11-20 DIAGNOSIS — G8929 Other chronic pain: Secondary | ICD-10-CM | POA: Insufficient documentation

## 2013-11-20 DIAGNOSIS — R51 Headache: Secondary | ICD-10-CM | POA: Insufficient documentation

## 2013-11-20 DIAGNOSIS — I10 Essential (primary) hypertension: Secondary | ICD-10-CM | POA: Insufficient documentation

## 2013-11-20 DIAGNOSIS — Z87891 Personal history of nicotine dependence: Secondary | ICD-10-CM | POA: Insufficient documentation

## 2013-11-20 DIAGNOSIS — M25561 Pain in right knee: Secondary | ICD-10-CM

## 2013-11-20 DIAGNOSIS — F3289 Other specified depressive episodes: Secondary | ICD-10-CM | POA: Insufficient documentation

## 2013-11-20 DIAGNOSIS — Z862 Personal history of diseases of the blood and blood-forming organs and certain disorders involving the immune mechanism: Secondary | ICD-10-CM | POA: Insufficient documentation

## 2013-11-20 DIAGNOSIS — M549 Dorsalgia, unspecified: Secondary | ICD-10-CM | POA: Insufficient documentation

## 2013-11-20 DIAGNOSIS — Z8639 Personal history of other endocrine, nutritional and metabolic disease: Secondary | ICD-10-CM | POA: Insufficient documentation

## 2013-11-20 DIAGNOSIS — J45909 Unspecified asthma, uncomplicated: Secondary | ICD-10-CM | POA: Insufficient documentation

## 2013-11-20 MED ORDER — ACETAMINOPHEN-CODEINE #3 300-30 MG PO TABS: 1.0000 | ORAL_TABLET | Freq: Four times a day (QID) | ORAL | Status: DC | PRN

## 2013-11-20 MED ORDER — PREDNISONE 50 MG PO TABS
60.0000 mg | ORAL_TABLET | Freq: Once | ORAL | Status: AC
  Administered 2013-11-20: 60 mg via ORAL
  Filled 2013-11-20 (×2): qty 1

## 2013-11-20 MED ORDER — ACETAMINOPHEN-CODEINE #3 300-30 MG PO TABS
1.0000 | ORAL_TABLET | Freq: Once | ORAL | Status: AC
  Administered 2013-11-20: 1 via ORAL
  Filled 2013-11-20: qty 1

## 2013-11-20 MED ORDER — PROMETHAZINE HCL 12.5 MG PO TABS
12.5000 mg | ORAL_TABLET | Freq: Once | ORAL | Status: AC
  Administered 2013-11-20: 12.5 mg via ORAL
  Filled 2013-11-20: qty 1

## 2013-11-20 MED ORDER — PREDNISONE 10 MG PO TABS: ORAL_TABLET | ORAL | Status: DC

## 2013-11-20 NOTE — ED Provider Notes (Signed)
Medical screening examination/treatment/procedure(s) were performed by non-physician practitioner and as supervising physician I was immediately available for consultation/collaboration.  EKG Interpretation   None         Tu Shimmel L Lacy Sofia, MD 11/20/13 2228 

## 2013-11-20 NOTE — ED Notes (Addendum)
Pt reporting pain and swelling in right knee for a couple weeks, worse today. Denies injury.   Reports 2 Tylenol about 5:30 tonight with no relief.  Also reporting headache off and on for past 2 days.  Denies dizziness or changes in vision.

## 2013-11-20 NOTE — ED Provider Notes (Signed)
CSN: [id number]     Arrival date & time 11/20/13  1846 History   First MD Initiated Contact with Patient 11/20/13 2111     Chief Complaint  Patient presents with  . Knee Pain  . Headache   (Consider location/radiation/quality/duration/timing/severity/associated sxs/prior Treatment) HPI Comments: Patient is a 43 year old female who presents to the emergency department with a complaint of headache and right knee pain. The patient states she's been having problems with headaches since she was" a little girl". The patient complains of headache mostly in the temporal areas, and extending to the back of the neck. There's been no recent injury. His been no change in diet or medication.  Patient also complains of pain in the right knee. She states she's not had it, followed on an, or had any other injury or trauma. She states that from time to time she has pain in this knee. She has been told she had some arthritis present. The patient is seen by Dr. Almond Lint, but patient states she has not discussed this with Dr. BMs recently.  Patient is a 43 y.o. female presenting with knee pain and headaches.  Knee Pain Location:  Knee Associated symptoms: back pain   Associated symptoms: no neck pain   Headache Associated symptoms: back pain   Associated symptoms: no abdominal pain, no cough, no dizziness, no neck pain, no photophobia and no seizures     Past Medical History  Diagnosis Date  . Migraine   . Back pain, chronic   . Hypertension   . High cholesterol   . Depression   . Anxiety   . Asthma    Past Surgical History  Procedure Laterality Date  . Cesarean section    . Tubal ligation    . Cholecystectomy     Family History  Problem Relation Age of Onset  . Seizures Mother   . Heart failure Mother   . Heart failure Father    History  Substance Use Topics  . Smoking status: Former Smoker -- 1.00 packs/day for 7 years    Types: Cigarettes    Quit date: 07/17/2012  . Smokeless tobacco:  Never Used  . Alcohol Use: No   OB History   Grav Para Term Preterm Abortions TAB SAB Ect Mult Living   3 3 3       3      Review of Systems  Constitutional: Negative for activity change.       All ROS Neg except as noted in HPI  HENT: Negative for nosebleeds.   Eyes: Negative for photophobia and discharge.  Respiratory: Negative for cough, shortness of breath and wheezing.   Cardiovascular: Negative for chest pain and palpitations.  Gastrointestinal: Negative for abdominal pain and blood in stool.  Genitourinary: Negative for dysuria, frequency and hematuria.  Musculoskeletal: Positive for arthralgias and back pain. Negative for neck pain.  Skin: Negative.   Neurological: Positive for headaches. Negative for dizziness, seizures and speech difficulty.  Psychiatric/Behavioral: Negative for hallucinations and confusion. The patient is nervous/anxious.        Depression    Allergies  Salami; Aspirin; Bee venom; Compazine; Imitrex; Nubain; Thorazine; Voltaren; and Zofran  Home Medications   Current Outpatient Rx  Name  Route  Sig  Dispense  Refill  . acetaminophen (TYLENOL) 500 MG tablet   Oral   Take 500-1,000 mg by mouth every 6 (six) hours as needed for pain.         Marland Kitchen albuterol (PROVENTIL HFA;VENTOLIN HFA) 108 (90  BASE) MCG/ACT inhaler   Inhalation   Inhale 2 puffs into the lungs every 6 (six) hours as needed for wheezing or shortness of breath.          Marland Kitchen amoxicillin (AMOXIL) 500 MG capsule   Oral   Take 1 capsule (500 mg total) by mouth 3 (three) times daily.   30 capsule   0   . HYDROcodone-acetaminophen (NORCO/VICODIN) 5-325 MG per tablet   Oral   Take 1 tablet by mouth every 4 (four) hours as needed for pain.   15 tablet   0   . Phenyleph-Promethazine-Cod 5-6.25-10 MG/5ML SYRP   Oral   Take 5 mLs by mouth every 4 (four) hours as needed.   120 mL   0   . traMADol (ULTRAM) 50 MG tablet   Oral   Take 1 tablet (50 mg total) by mouth every 8 (eight) hours  as needed for pain.   15 tablet   0    BP 140/88  Pulse 96  Temp(Src) 98.7 F (37.1 C)  Resp 20  Ht 5\' 2"  (1.575 m)  Wt 201 lb (91.173 kg)  BMI 36.75 kg/m2  SpO2 96%  LMP 11/05/2013 Physical Exam  Nursing note and vitals reviewed. Constitutional: She is oriented to person, place, and time. She appears well-developed and well-nourished.  Non-toxic appearance.  HENT:  Head: Normocephalic.  Right Ear: Tympanic membrane and external ear normal.  Left Ear: Tympanic membrane and external ear normal.  Eyes: EOM and lids are normal. Pupils are equal, round, and reactive to light.  Neck: Normal range of motion. Neck supple. Carotid bruit is not present.  Cardiovascular: Normal rate, regular rhythm, normal heart sounds, intact distal pulses and normal pulses.   Pulmonary/Chest: Breath sounds normal. No respiratory distress.  Abdominal: Soft. Bowel sounds are normal. There is no tenderness. There is no guarding.  Musculoskeletal: Normal range of motion.  There is soreness with range of motion of the right knee. There is crepitus present. There is no posterior mass appreciated. The knee is not hot. Full range of motion of the Achilles tendon on the right. Full range of motion of the toes of the right foot.  Lymphadenopathy:       Head (right side): No submandibular adenopathy present.       Head (left side): No submandibular adenopathy present.    She has no cervical adenopathy.  Neurological: She is alert and oriented to person, place, and time. She has normal strength. No cranial nerve deficit or sensory deficit. She exhibits normal muscle tone. Coordination normal.  Skin: Skin is warm and dry.  Psychiatric: She has a normal mood and affect. Her speech is normal.    ED Course  Procedures (including critical care time) Labs Review Labs Reviewed - No data to display Imaging Review No results found.  EKG Interpretation   None       MDM  No diagnosis found. **I have reviewed  nursing notes, vital signs, and all appropriate lab and imaging results for this patient.*  Patient presents to the emergency department with complaint of headache. Patient has history of chronic headaches for several years. The patient also complains of pain of the right knee. No recent injury or trauma reported. The examination is negative for any acute neurologic changes.  Prescription for Tylenol with Codeine, and prednisone given to the patient for assistance with her headache and with her knee. Patient advised to see Dr. Ledell Peoples for additional evaluation and pain management.  Kathie DikeHobson M Toryn Mcclinton, PA-C 11/20/13 2216

## 2013-11-20 NOTE — Discharge Instructions (Signed)
Please see Dr. Ledell Peoples for evaluation and pain management concerning your headaches in your knee. Please use Tylenol with codeine with caution. This medication may cause drowsiness. Knee Pain Knee pain can be a result of an injury or other medical conditions. Treatment will depend on the cause of your pain. HOME CARE  Only take medicine as told by your doctor.  Keep a healthy weight. Being overweight can make the knee hurt more.  Stretch before exercising or playing sports.  If there is constant knee pain, change the way you exercise. Ask your doctor for advice.  Make sure shoes fit well. Choose the right shoe for the sport or activity.  Protect your knees. Wear kneepads if needed.  Rest when you are tired. GET HELP RIGHT AWAY IF:   Your knee pain does not stop.  Your knee pain does not get better.  Your knee joint feels hot to the touch.  You have a fever. MAKE SURE YOU:   Understand these instructions.  Will watch this condition.  Will get help right away if you are not doing well or get worse. Document Released: 12/30/2008 Document Revised: 12/26/2011 Document Reviewed: 12/30/2008 [facility] Patient Information 2014 [place], [place].  Headaches, Frequently Asked Questions MIGRAINE HEADACHES Q: What is migraine? What causes it? How can I treat it? A: Generally, migraine headaches begin as a dull ache. Then they develop into a constant, throbbing, and pulsating pain. You may experience pain at the temples. You may experience pain at the front or back of one or both sides of the head. The pain is usually accompanied by a combination of:  Nausea.  Vomiting.  Sensitivity to light and noise. Some people (about 15%) experience an aura (see below) before an attack. The cause of migraine is believed to be chemical reactions in the brain. Treatment for migraine may include over-the-counter or prescription medications. It may also include self-help techniques. These include  relaxation training and biofeedback.  Q: What is an aura? A: About 15% of people with migraine get an "aura". This is a sign of neurological symptoms that occur before a migraine headache. You may see wavy or jagged lines, dots, or flashing lights. You might experience tunnel vision or blind spots in one or both eyes. The aura can include visual or auditory hallucinations (something imagined). It may include disruptions in smell (such as strange odors), taste or touch. Other symptoms include:  Numbness.  A "pins and needles" sensation.  Difficulty in recalling or speaking the correct word. These neurological events may last as long as 60 minutes. These symptoms will fade as the headache begins. Q: What is a trigger? A: Certain physical or environmental factors can lead to or "trigger" a migraine. These include:  Foods.  Hormonal changes.  Weather.  Stress. It is important to remember that triggers are different for everyone. To help prevent migraine attacks, you need to figure out which triggers affect you. Keep a headache diary. This is a good way to track triggers. The diary will help you talk to your healthcare professional about your condition. Q: Does weather affect migraines? A: Bright sunshine, hot, humid conditions, and drastic changes in barometric pressure may lead to, or "trigger," a migraine attack in some people. But studies have shown that weather does not act as a trigger for everyone with migraines. Q: What is the link between migraine and hormones? A: Hormones start and regulate many of your body's functions. Hormones keep your body in balance within a constantly changing  environment. The levels of hormones in your body are unbalanced at times. Examples are during menstruation, pregnancy, or menopause. That can lead to a migraine attack. In fact, about three quarters of all women with migraine report that their attacks are related to the menstrual cycle.  Q: Is there an  increased risk of stroke for migraine sufferers? A: The likelihood of a migraine attack causing a stroke is very remote. That is not to say that migraine sufferers cannot have a stroke associated with their migraines. In persons under age 42, the most common associated factor for stroke is migraine headache. But over the course of a person's normal life span, the occurrence of migraine headache may actually be associated with a reduced risk of dying from cerebrovascular disease due to stroke.  Q: What are acute medications for migraine? A: Acute medications are used to treat the pain of the headache after it has started. Examples over-the-counter medications, NSAIDs, ergots, and triptans.  Q: What are the triptans? A: Triptans are the newest class of abortive medications. They are specifically targeted to treat migraine. Triptans are vasoconstrictors. They moderate some chemical reactions in the brain. The triptans work on receptors in your brain. Triptans help to restore the balance of a neurotransmitter called serotonin. Fluctuations in levels of serotonin are thought to be a main cause of migraine.  Q: Are over-the-counter medications for migraine effective? A: Over-the-counter, or "OTC," medications may be effective in relieving mild to moderate pain and associated symptoms of migraine. But you should see your caregiver before beginning any treatment regimen for migraine.  Q: What are preventive medications for migraine? A: Preventive medications for migraine are sometimes referred to as "prophylactic" treatments. They are used to reduce the frequency, severity, and length of migraine attacks. Examples of preventive medications include antiepileptic medications, antidepressants, beta-blockers, calcium channel blockers, and NSAIDs (nonsteroidal anti-inflammatory drugs). Q: Why are anticonvulsants used to treat migraine? A: During the past few years, there has been an increased interest in antiepileptic  drugs for the prevention of migraine. They are sometimes referred to as "anticonvulsants". Both epilepsy and migraine may be caused by similar reactions in the brain.  Q: Why are antidepressants used to treat migraine? A: Antidepressants are typically used to treat people with depression. They may reduce migraine frequency by regulating chemical levels, such as serotonin, in the brain.  Q: What alternative therapies are used to treat migraine? A: The term "alternative therapies" is often used to describe treatments considered outside the scope of conventional Western medicine. Examples of alternative therapy include acupuncture, acupressure, and yoga. Another common alternative treatment is herbal therapy. Some herbs are believed to relieve headache pain. Always discuss alternative therapies with your caregiver before proceeding. Some herbal products contain arsenic and other toxins. TENSION HEADACHES Q: What is a tension-type headache? What causes it? How can I treat it? A: Tension-type headaches occur randomly. They are often the result of temporary stress, anxiety, fatigue, or anger. Symptoms include soreness in your temples, a tightening band-like sensation around your head (a "vice-like" ache). Symptoms can also include a pulling feeling, pressure sensations, and contracting head and neck muscles. The headache begins in your forehead, temples, or the back of your head and neck. Treatment for tension-type headache may include over-the-counter or prescription medications. Treatment may also include self-help techniques such as relaxation training and biofeedback. CLUSTER HEADACHES Q: What is a cluster headache? What causes it? How can I treat it? A: Cluster headache gets its name because the attacks  come in groups. The pain arrives with little, if any, warning. It is usually on one side of the head. A tearing or bloodshot eye and a runny nose on the same side of the headache may also accompany the pain.  Cluster headaches are believed to be caused by chemical reactions in the brain. They have been described as the most severe and intense of any headache type. Treatment for cluster headache includes prescription medication and oxygen. SINUS HEADACHES Q: What is a sinus headache? What causes it? How can I treat it? A: When a cavity in the bones of the face and skull (a sinus) becomes inflamed, the inflammation will cause localized pain. This condition is usually the result of an allergic reaction, a tumor, or an infection. If your headache is caused by a sinus blockage, such as an infection, you will probably have a fever. An x-ray will confirm a sinus blockage. Your caregiver's treatment might include antibiotics for the infection, as well as antihistamines or decongestants.  REBOUND HEADACHES Q: What is a rebound headache? What causes it? How can I treat it? A: A pattern of taking acute headache medications too often can lead to a condition known as "rebound headache." A pattern of taking too much headache medication includes taking it more than 2 days per week or in excessive amounts. That means more than the label or a caregiver advises. With rebound headaches, your medications not only stop relieving pain, they actually begin to cause headaches. Doctors treat rebound headache by tapering the medication that is being overused. Sometimes your caregiver will gradually substitute a different type of treatment or medication. Stopping may be a challenge. Regularly overusing a medication increases the potential for serious side effects. Consult a caregiver if you regularly use headache medications more than 2 days per week or more than the label advises. ADDITIONAL QUESTIONS AND ANSWERS Q: What is biofeedback? A: Biofeedback is a self-help treatment. Biofeedback uses special equipment to monitor your body's involuntary physical responses. Biofeedback monitors:  Breathing.  Pulse.  Heart  rate.  Temperature.  Muscle tension.  Brain activity. Biofeedback helps you refine and perfect your relaxation exercises. You learn to control the physical responses that are related to stress. Once the technique has been mastered, you do not need the equipment any more. Q: Are headaches hereditary? A: Four out of five (80%) of people that suffer report a family history of migraine. Scientists are not sure if this is genetic or a family predisposition. Despite the uncertainty, a child has a 50% chance of having migraine if one parent suffers. The child has a 75% chance if both parents suffer.  Q: Can children get headaches? A: By the time they reach high school, most young people have experienced some type of headache. Many safe and effective approaches or medications can prevent a headache from occurring or stop it after it has begun.  Q: What type of doctor should I see to diagnose and treat my headache? A: Start with your primary caregiver. Discuss his or her experience and approach to headaches. Discuss methods of classification, diagnosis, and treatment. Your caregiver may decide to recommend you to a headache specialist, depending upon your symptoms or other physical conditions. Having diabetes, allergies, etc., may require a more comprehensive and inclusive approach to your headache. The National [employer] will provide, upon request, a list of [facility] physician members in your state. Document Released: 12/24/2003 Document Revised: 12/26/2011 Document Reviewed: 06/02/2008 [facility] Patient Information 2014 [place], [place].

## 2013-11-23 ENCOUNTER — Emergency Department (HOSPITAL_COMMUNITY)
Admission: EM | Admit: 2013-11-23 | Discharge: 2013-11-23 | Disposition: A | Discharge status: Home / Self Care | Payer: Self-pay | Attending: Emergency Medicine | Admitting: Emergency Medicine

## 2013-11-23 ENCOUNTER — Encounter (HOSPITAL_COMMUNITY): Payer: Self-pay | Admitting: Emergency Medicine

## 2013-11-23 DIAGNOSIS — G8929 Other chronic pain: Secondary | ICD-10-CM | POA: Insufficient documentation

## 2013-11-23 DIAGNOSIS — I10 Essential (primary) hypertension: Secondary | ICD-10-CM | POA: Insufficient documentation

## 2013-11-23 DIAGNOSIS — G43009 Migraine without aura, not intractable, without status migrainosus: Secondary | ICD-10-CM

## 2013-11-23 DIAGNOSIS — Z8659 Personal history of other mental and behavioral disorders: Secondary | ICD-10-CM | POA: Insufficient documentation

## 2013-11-23 DIAGNOSIS — Z79899 Other long term (current) drug therapy: Secondary | ICD-10-CM | POA: Insufficient documentation

## 2013-11-23 DIAGNOSIS — G43909 Migraine, unspecified, not intractable, without status migrainosus: Secondary | ICD-10-CM | POA: Insufficient documentation

## 2013-11-23 DIAGNOSIS — J45909 Unspecified asthma, uncomplicated: Secondary | ICD-10-CM | POA: Insufficient documentation

## 2013-11-23 DIAGNOSIS — Z87891 Personal history of nicotine dependence: Secondary | ICD-10-CM | POA: Insufficient documentation

## 2013-11-23 DIAGNOSIS — M436 Torticollis: Secondary | ICD-10-CM | POA: Insufficient documentation

## 2013-11-23 DIAGNOSIS — E7800 Pure hypercholesterolemia, unspecified: Secondary | ICD-10-CM | POA: Insufficient documentation

## 2013-11-23 MED ORDER — HYDROMORPHONE HCL PF 1 MG/ML IJ SOLN
1.0000 mg | Freq: Once | INTRAMUSCULAR | Status: AC
  Administered 2013-11-23: 1 mg via INTRAMUSCULAR
  Filled 2013-11-23: qty 1

## 2013-11-23 MED ORDER — PROMETHAZINE HCL 25 MG/ML IJ SOLN
25.0000 mg | Freq: Once | INTRAMUSCULAR | Status: AC
  Administered 2013-11-23: 25 mg via INTRAVENOUS
  Filled 2013-11-23: qty 1

## 2013-11-23 NOTE — ED Provider Notes (Signed)
CSN: [id number]     Arrival date & time 11/23/13  2129 History   First MD Initiated Contact with Patient 11/23/13 2149     Chief Complaint  Patient presents with  . Headache   (Consider location/radiation/quality/duration/timing/severity/associated sxs/prior Treatment) HPI Comments: Tye [place] D Manera is a 43 y.o. Female presenting with a migraine headache which started today around noon.   The patient has a history of migraine and the current symptoms are similar to previous episodes of this condition.  The patients symptoms were not  preceded by prodromal symptoms such as visual scotoma and she denies photophobia today, although her headaches are sometime accompanied by this symptom.  The patient has bilateral temple pain which over time radiates to her posterior head which is typically the pattern for her migraine as well.  There has been no fevers, chills, syncope, confusion or localized weakness.  She does have nausea without emesis and also describes nasal congestion and clear nasal drainage which she blames on her allergies.  She denies facial pain.  The patient has tried tylenol without relief of symptoms.        The history is provided by the patient.    Past Medical History  Diagnosis Date  . Migraine   . Back pain, chronic   . Hypertension   . High cholesterol   . Depression   . Anxiety   . Asthma    Past Surgical History  Procedure Laterality Date  . Cesarean section    . Tubal ligation    . Cholecystectomy     Family History  Problem Relation Age of Onset  . Seizures Mother   . Heart failure Mother   . Heart failure Father    History  Substance Use Topics  . Smoking status: Former Smoker -- 1.00 packs/day for 7 years    Types: Cigarettes    Quit date: 07/17/2012  . Smokeless tobacco: Never Used  . Alcohol Use: No   OB History   Grav Para Term Preterm Abortions TAB SAB Ect Mult Living   3 3 3       3      Review of Systems  Constitutional: Negative for  fever and chills.  HENT: Negative for congestion and sore throat.   Eyes: Negative.   Respiratory: Negative for chest tightness and shortness of breath.   Cardiovascular: Negative for chest pain.  Gastrointestinal: Negative for nausea and abdominal pain.  Genitourinary: Negative.   Musculoskeletal: Positive for neck stiffness. Negative for arthralgias, joint swelling and neck pain.  Skin: Negative.  Negative for rash and wound.  Neurological: Positive for headaches. Negative for dizziness, weakness, light-headedness and numbness.  Psychiatric/Behavioral: Negative.     Allergies  Salami; Aspirin; Bee venom; Compazine; Imitrex; Nubain; Thorazine; Voltaren; and Zofran  Home Medications   Current Outpatient Rx  Name  Route  Sig  Dispense  Refill  . acetaminophen (TYLENOL) 500 MG tablet   Oral   Take 500-1,000 mg by mouth every 6 (six) hours as needed for pain.         Marland Kitchen. acetaminophen-codeine (TYLENOL #3) 300-30 MG per tablet   Oral   Take 1 tablet by mouth every 6 (six) hours as needed for moderate pain.   15 tablet   0   . albuterol (PROVENTIL HFA;VENTOLIN HFA) 108 (90 BASE) MCG/ACT inhaler   Inhalation   Inhale 2 puffs into the lungs every 6 (six) hours as needed for wheezing or shortness of breath.          .Marland Kitchen  predniSONE (DELTASONE) 10 MG tablet      5,4,3,2,1 - take with food   15 tablet   0    BP 131/81  Pulse 89  Temp(Src) 98.3 F (36.8 C) (Oral)  Resp 20  SpO2 94%  LMP 11/05/2013 Physical Exam  Nursing note and vitals reviewed. Constitutional: She is oriented to person, place, and time. She appears well-developed and well-nourished.  HENT:  Head: Normocephalic and atraumatic.  Nose: Rhinorrhea present.  Mouth/Throat: Oropharynx is clear and moist.  Eyes: EOM are normal. Pupils are equal, round, and reactive to light.  Neck: Normal range of motion. Neck supple.  Cardiovascular: Normal rate and normal heart sounds.   Pulmonary/Chest: Effort normal.   Abdominal: Soft. There is no tenderness.  Musculoskeletal: Normal range of motion.  Lymphadenopathy:    She has no cervical adenopathy.  Neurological: She is alert and oriented to person, place, and time. She has normal strength. No sensory deficit. Gait normal. GCS eye subscore is 4. GCS verbal subscore is 5. GCS motor subscore is 6.  Normal heel-shin, normal rapid alternating movements. Cranial nerves III-XII intact.  No pronator drift.  Skin: Skin is warm and dry. No rash noted.  Psychiatric: She has a normal mood and affect. Her speech is normal and behavior is normal. Thought content normal. Cognition and memory are normal.    ED Course  Procedures (including critical care time) Labs Review Labs Reviewed - No data to display Imaging Review No results found.  EKG Interpretation   None       MDM   1. Migraine headache without aura    Patient was given dilaudid 1 mg and phenergan 25 mg IM with improving sx at time of dc.  Her brother is driving her home.  She was advised to avoid driving tonight given the medicines she received.  She reports she does not drive.  Encouraged to go home and sleep, get rechecked by her pcp for any persistent sx.  Her exam is normal tonight with no neurologic deficit.  No facial or sinus pain to suggestive bacterial sinusitis.  Sx c/w her chronic intermittent migraine headaches.    Burgess Amor, PA-C 11/23/13 2358

## 2013-11-23 NOTE — ED Notes (Addendum)
Pt c/o headache, head congestion, runny nose, and cough since 12pm today.

## 2013-11-23 NOTE — Discharge Instructions (Signed)
Migraine Headache A migraine headache is an intense, throbbing pain on one or both sides of your head. A migraine can last for 30 minutes to several hours. CAUSES  The exact cause of a migraine headache is not always known. However, a migraine may be caused when nerves in the brain become irritated and release chemicals that cause inflammation. This causes pain. Certain things may also trigger migraines, such as:  Alcohol.  Smoking.  Stress.  Menstruation.  Aged cheeses.  Foods or drinks that contain nitrates, glutamate, aspartame, or tyramine.  Lack of sleep.  Chocolate.  Caffeine.  Hunger.  Physical exertion.  Fatigue.  Medicines used to treat chest pain (nitroglycerine), birth control pills, estrogen, and some blood pressure medicines. SIGNS AND SYMPTOMS  Pain on one or both sides of your head.  Pulsating or throbbing pain.  Severe pain that prevents daily activities.  Pain that is aggravated by any physical activity.  Nausea, vomiting, or both.  Dizziness.  Pain with exposure to bright lights, loud noises, or activity.  General sensitivity to bright lights, loud noises, or smells. Before you get a migraine, you may get warning signs that a migraine is coming (aura). An aura may include:  Seeing flashing lights.  Seeing bright spots, halos, or zig-zag lines.  Having tunnel vision or blurred vision.  Having feelings of numbness or tingling.  Having trouble talking.  Having muscle weakness. DIAGNOSIS  A migraine headache is often diagnosed based on:  Symptoms.  Physical exam.  A CT scan or MRI of your head. These imaging tests cannot diagnose migraines, but they can help rule out other causes of headaches. TREATMENT Medicines may be given for pain and nausea. Medicines can also be given to help prevent recurrent migraines.  HOME CARE INSTRUCTIONS  Only take over-the-counter or prescription medicines for pain or discomfort as directed by your  health care provider. The use of long-term narcotics is not recommended.  Lie down in a dark, quiet room when you have a migraine.  Keep a journal to find out what may trigger your migraine headaches. For example, write down:  What you eat and drink.  How much sleep you get.  Any change to your diet or medicines.  Limit alcohol consumption.  Quit smoking if you smoke.  Get 7 9 hours of sleep, or as recommended by your health care provider.  Limit stress.  Keep lights dim if bright lights bother you and make your migraines worse. SEEK IMMEDIATE MEDICAL CARE IF:   Your migraine becomes severe.  You have a fever.  You have a stiff neck.  You have vision loss.  You have muscular weakness or loss of muscle control.  You start losing your balance or have trouble walking.  You feel faint or pass out.  You have severe symptoms that are different from your first symptoms. MAKE SURE YOU:   Understand these instructions.  Will watch your condition.  Will get help right away if you are not doing well or get worse. Document Released: 10/03/2005 Document Revised: 07/24/2013 Document Reviewed: 06/10/2013 [facility] Patient Information 2014 [employer] [place].  Go home and rest. Do not drive for the next 4 hours as you have received a narcotic injection tonight.  See your doctor or return here if you have any persistent or worsened symptoms.  Your exam is reassuring that this is a simple migraine.

## 2013-11-26 NOTE — ED Provider Notes (Signed)
Medical screening examination/treatment/procedure(s) were performed by non-physician practitioner and as supervising physician I was immediately available for consultation/collaboration.  EKG Interpretation   None         Shelda JakesScott W. Chantry Headen, MD 11/26/13 1316

## 2013-11-27 ENCOUNTER — Emergency Department (HOSPITAL_COMMUNITY)
Admission: EM | Admit: 2013-11-27 | Discharge: 2013-11-27 | Disposition: A | Discharge status: Home / Self Care | Payer: Self-pay | Attending: Emergency Medicine | Admitting: Emergency Medicine

## 2013-11-27 ENCOUNTER — Encounter (HOSPITAL_COMMUNITY): Payer: Self-pay | Admitting: Emergency Medicine

## 2013-11-27 DIAGNOSIS — F329 Major depressive disorder, single episode, unspecified: Secondary | ICD-10-CM | POA: Insufficient documentation

## 2013-11-27 DIAGNOSIS — F419 Anxiety disorder, unspecified: Secondary | ICD-10-CM

## 2013-11-27 DIAGNOSIS — Z862 Personal history of diseases of the blood and blood-forming organs and certain disorders involving the immune mechanism: Secondary | ICD-10-CM | POA: Insufficient documentation

## 2013-11-27 DIAGNOSIS — J45909 Unspecified asthma, uncomplicated: Secondary | ICD-10-CM | POA: Insufficient documentation

## 2013-11-27 DIAGNOSIS — I10 Essential (primary) hypertension: Secondary | ICD-10-CM | POA: Insufficient documentation

## 2013-11-27 DIAGNOSIS — Z87891 Personal history of nicotine dependence: Secondary | ICD-10-CM | POA: Insufficient documentation

## 2013-11-27 DIAGNOSIS — G8929 Other chronic pain: Secondary | ICD-10-CM | POA: Insufficient documentation

## 2013-11-27 DIAGNOSIS — F411 Generalized anxiety disorder: Secondary | ICD-10-CM | POA: Insufficient documentation

## 2013-11-27 DIAGNOSIS — F3289 Other specified depressive episodes: Secondary | ICD-10-CM | POA: Insufficient documentation

## 2013-11-27 DIAGNOSIS — Z8639 Personal history of other endocrine, nutritional and metabolic disease: Secondary | ICD-10-CM | POA: Insufficient documentation

## 2013-11-27 MED ORDER — LORAZEPAM 1 MG PO TABS: 1.0000 mg | ORAL_TABLET | Freq: Two times a day (BID) | ORAL | Status: DC | PRN

## 2013-11-27 MED ORDER — LORAZEPAM 1 MG PO TABS
1.0000 mg | ORAL_TABLET | Freq: Once | ORAL | Status: AC
  Administered 2013-11-27: 1 mg via ORAL
  Filled 2013-11-27: qty 1

## 2013-11-27 NOTE — Discharge Instructions (Signed)
Medication for anxiety. Followup with community mental health.

## 2013-11-27 NOTE — ED Provider Notes (Signed)
CSN: [id number]     Arrival date & time 11/27/13  1421 History   This chart was scribed for Donnetta HutchingBrian Amedeo Detweiler, MD by Manuela Schwartzaylor Day, ED scribe. This patient was seen in room APA15/APA15 and the patient's care was started at 1421.  Chief Complaint  Patient presents with  . Anxiety   The history is provided by the patient. No language interpreter was used.   HPI Comments: Beverly Tran is a 43 y.o. female who presents to the Emergency Department complaining of recent traumatic events which have caused her to become anxious and w/a HA for the past few days. She reports that her godmother passed away this AM and that she was very close with her. She reports last weekend, her son was arrested and is in jail which has also caused her to become stressed out and anxious. She reports associated chest tightness which she attributes to anxiety. She states it has been years since she struggled w/anxiety.  She denies prior psychiatric hospitalizations. Years ago she used Valium for anxiety when she saw a psychiatrist.  She lives at home w/her brother.  Past Medical History  Diagnosis Date  . Migraine   . Back pain, chronic   . Hypertension   . High cholesterol   . Depression   . Anxiety   . Asthma    Past Surgical History  Procedure Laterality Date  . Cesarean section    . Tubal ligation    . Cholecystectomy     Family History  Problem Relation Age of Onset  . Seizures Mother   . Heart failure Mother   . Heart failure Father    History  Substance Use Topics  . Smoking status: Former Smoker -- 1.00 packs/day for 7 years    Types: Cigarettes    Quit date: 07/17/2012  . Smokeless tobacco: Never Used  . Alcohol Use: No   OB History   Grav Para Term Preterm Abortions TAB SAB Ect Mult Living   3 3 3       3      Review of Systems  Constitutional: Negative for fever and chills.  Respiratory: Negative for cough and shortness of breath.   Cardiovascular: Negative for chest pain.   Gastrointestinal: Negative for abdominal pain.  Musculoskeletal: Negative for back pain.  Psychiatric/Behavioral: The patient is nervous/anxious.   All other systems reviewed and are negative.   Allergies  Salami; Aspirin; Bee venom; Compazine; Imitrex; Nubain; Prednisone; Thorazine; Voltaren; and Zofran  Home Medications   Current Outpatient Rx  Name  Route  Sig  Dispense  Refill  . acetaminophen (TYLENOL) 500 MG tablet   Oral   Take 500-1,000 mg by mouth every 6 (six) hours as needed for pain.         Marland Kitchen. acetaminophen-codeine (TYLENOL #3) 300-30 MG per tablet   Oral   Take 1 tablet by mouth every 6 (six) hours as needed for moderate pain.   15 tablet   0   . albuterol (PROVENTIL HFA;VENTOLIN HFA) 108 (90 BASE) MCG/ACT inhaler   Inhalation   Inhale 2 puffs into the lungs every 6 (six) hours as needed for wheezing or shortness of breath.           Triage Vitals: BP 118/83  Pulse 99  Temp(Src) 98.2 F (36.8 C) (Oral)  Resp 18  Ht 5\' 5"  (1.651 m)  Wt 192 lb (87.091 kg)  BMI 31.95 kg/m2  SpO2 98%  LMP 11/05/2013  Physical Exam  Nursing note  and vitals reviewed. Constitutional: She is oriented to person, place, and time. She appears well-developed and well-nourished.  HENT:  Head: Normocephalic and atraumatic.  Eyes: Conjunctivae and EOM are normal. Pupils are equal, round, and reactive to light.  Neck: Normal range of motion. Neck supple.  Cardiovascular: Normal rate, regular rhythm and normal heart sounds.   Pulmonary/Chest: Effort normal and breath sounds normal.  Abdominal: Soft. Bowel sounds are normal.  Musculoskeletal: Normal range of motion.  Neurological: She is alert and oriented to person, place, and time.  Skin: Skin is warm and dry.  Psychiatric: Her behavior is normal.  Slightly flat affect   ED Course  Procedures (including critical care time) DIAGNOSTIC STUDIES: Oxygen Saturation is 96% on room air, adequate by my interpretation.     COORDINATION OF CARE: At 300 PM Discussed treatment plan with patient which includes ativan. Patient agrees.   Labs Review Labs Reviewed - No data to display Imaging Review No results found.  EKG Interpretation   None      MDM   Final diagnoses:  None   No evidence of suicidal or homicidal ideation. No psychosis. Rx Ativan 1 mg twice a day when necessary for anxiety.   Followup with community mental health.    I personally performed the services described in this documentation, which was scribed in my presence. The recorded information has been reviewed and is accurate.       Donnetta Hutching, MD 11/27/13 [phone]

## 2013-11-27 NOTE — ED Notes (Signed)
Pt c/o anxiety/ha/chest tightness from her son being in jail and her godmother passing away this am.

## 2014-08-18 ENCOUNTER — Encounter (HOSPITAL_COMMUNITY): Payer: Self-pay | Admitting: Emergency Medicine

## 2015-01-21 ENCOUNTER — Emergency Department (HOSPITAL_COMMUNITY)
Admission: EM | Admit: 2015-01-21 | Discharge: 2015-01-21 | Disposition: A | Discharge status: Home / Self Care | Payer: Self-pay | Attending: Emergency Medicine | Admitting: Emergency Medicine

## 2015-01-21 ENCOUNTER — Encounter (HOSPITAL_COMMUNITY): Payer: Self-pay | Admitting: Emergency Medicine

## 2015-01-21 DIAGNOSIS — F419 Anxiety disorder, unspecified: Secondary | ICD-10-CM | POA: Insufficient documentation

## 2015-01-21 DIAGNOSIS — Z87891 Personal history of nicotine dependence: Secondary | ICD-10-CM | POA: Insufficient documentation

## 2015-01-21 DIAGNOSIS — G43009 Migraine without aura, not intractable, without status migrainosus: Secondary | ICD-10-CM

## 2015-01-21 DIAGNOSIS — Z79899 Other long term (current) drug therapy: Secondary | ICD-10-CM | POA: Insufficient documentation

## 2015-01-21 DIAGNOSIS — G43909 Migraine, unspecified, not intractable, without status migrainosus: Secondary | ICD-10-CM | POA: Insufficient documentation

## 2015-01-21 DIAGNOSIS — J45909 Unspecified asthma, uncomplicated: Secondary | ICD-10-CM | POA: Insufficient documentation

## 2015-01-21 DIAGNOSIS — I10 Essential (primary) hypertension: Secondary | ICD-10-CM | POA: Insufficient documentation

## 2015-01-21 DIAGNOSIS — G8929 Other chronic pain: Secondary | ICD-10-CM | POA: Insufficient documentation

## 2015-01-21 DIAGNOSIS — Z8639 Personal history of other endocrine, nutritional and metabolic disease: Secondary | ICD-10-CM | POA: Insufficient documentation

## 2015-01-21 MED ORDER — METOCLOPRAMIDE HCL 5 MG/ML IJ SOLN
10.0000 mg | Freq: Once | INTRAMUSCULAR | Status: AC
  Administered 2015-01-21: 10 mg via INTRAVENOUS
  Filled 2015-01-21: qty 2

## 2015-01-21 MED ORDER — DIPHENHYDRAMINE HCL 50 MG/ML IJ SOLN
50.0000 mg | Freq: Once | INTRAMUSCULAR | Status: AC
  Administered 2015-01-21: 50 mg via INTRAVENOUS
  Filled 2015-01-21: qty 1

## 2015-01-21 MED ORDER — DEXAMETHASONE SODIUM PHOSPHATE 4 MG/ML IJ SOLN
10.0000 mg | Freq: Once | INTRAMUSCULAR | Status: AC
  Administered 2015-01-21: 10 mg via INTRAVENOUS
  Filled 2015-01-21: qty 3

## 2015-01-21 MED ORDER — SODIUM CHLORIDE 0.9 % IV SOLN
1000.0000 mL | Freq: Once | INTRAVENOUS | Status: AC
  Administered 2015-01-21: 1000 mL via INTRAVENOUS

## 2015-01-21 NOTE — Discharge Instructions (Signed)

## 2015-01-21 NOTE — ED Notes (Signed)
Pt c/o headache since 9am and she denies any n/v

## 2015-01-21 NOTE — ED Provider Notes (Signed)
This chart was scribed for Layla MawKristen N Alexandre Faries, DO by Marica OtterNusrat Rahman, ED Scribe. This patient was seen in room APA16A/APA16A and the patient's care was started at 10:24 PM.  TIME SEEN: 10:24 PM   CHIEF COMPLAINT:  Chief Complaint  Patient presents with  . Headache     HPI:  PCP: MUSE,ROCHELLE D., PA-C HPI Comments: Beverly Tran is a 44 y.o. female , with PMH noted below including Hx of migraines, who presents to the Emergency Department complaining of atraumatic, gradual onset migraine headache with associated nausea onset at 9AM. Pt reports that light and sounds make the pain worse. Pt reports her migraines were successfully treated with dilaudid and morphine in the past. States the headache is throbbing and located in the bitemporal area and septal region. States this feels exactly like her prior migraines. No numbness, tingling or focal weakness. No fever. Not on anticoagulation.  ROS: See HPI Constitutional: no fever  Eyes: no drainage. Positive for photophobia.   ENT: no runny nose   Cardiovascular:  no chest pain  Resp: no SOB  GI: no vomiting. Positive for nausea.  GU: no dysuria Integumentary: no rash  Allergy: no hives  Musculoskeletal: no leg swelling  Neurological: no slurred speech. Positive for headache.  ROS otherwise negative  PAST MEDICAL HISTORY/PAST SURGICAL HISTORY:  Past Medical History  Diagnosis Date  . Migraine   . Back pain, chronic   . Hypertension   . High cholesterol   . Depression   . Anxiety   . Asthma     MEDICATIONS:  Prior to Admission medications   Medication Sig Start Date End Date Taking? Authorizing Provider  acetaminophen (TYLENOL) 500 MG tablet Take 500-1,000 mg by mouth every 6 (six) hours as needed for pain.    Historical Provider, MD  acetaminophen-codeine (TYLENOL #3) 300-30 MG per tablet Take 1 tablet by mouth every 6 (six) hours as needed for moderate pain. 11/20/13   Ivery QualeHobson Bryant, PA-C  albuterol (PROVENTIL HFA;VENTOLIN HFA)  108 (90 BASE) MCG/ACT inhaler Inhale 2 puffs into the lungs every 6 (six) hours as needed for wheezing or shortness of breath.     Historical Provider, MD  LORazepam (ATIVAN) 1 MG tablet Take 1 tablet (1 mg total) by mouth 2 (two) times daily as needed for anxiety. 11/27/13   Donnetta HutchingBrian Cook, MD    ALLERGIES:  Allergies  Allergen Reactions  . Neoma LamingSalami [Pickled Meat] Shortness Of Breath and Swelling    Also Allergic to POPCORN: same reaction  . Aspirin Other (See Comments)    Nose bleeds  . Bee Venom Swelling    WASP/HORNET  . Compazine [Prochlorperazine] Hives  . Imitrex [Sumatriptan] Hives  . Nubain [Nalbuphine Hcl] Swelling and Other (See Comments)    Eye swelling  . Prednisone   . Thorazine [Chlorpromazine] Hives  . Voltaren [Diclofenac Sodium] Swelling  . Zofran [Ondansetron Hcl] Nausea Only    SOCIAL HISTORY:  History  Substance Use Topics  . Smoking status: Former Smoker -- 1.00 packs/day for 7 years    Types: Cigarettes    Quit date: 07/17/2012  . Smokeless tobacco: Never Used  . Alcohol Use: No    FAMILY HISTORY: Family History  Problem Relation Age of Onset  . Seizures Mother   . Heart failure Mother   . Heart failure Father     EXAM: Triage Vitals: BP 141/84 mmHg  Pulse 96  Temp(Src) 98.7 F (37.1 C)  Resp 17  Ht 5\' 5"  (1.651 m)  Wt  191 lb (86.637 kg)  BMI 31.78 kg/m2  SpO2 98%  LMP 01/08/2015 CONSTITUTIONAL: Alert and oriented and responds appropriately to questions. Well-appearing; well-nourished HEAD: Normocephalic EYES: Conjunctivae clear, PERRL ENT: normal nose; no rhinorrhea; moist mucous membranes; pharynx without lesions noted NECK: Supple, no meningismus, no LAD  CARD: RRR; S1 and S2 appreciated; no murmurs, no clicks, no rubs, no gallops RESP: Normal chest excursion without splinting or tachypnea; breath sounds clear and equal bilaterally; no wheezes, no rhonchi, no rales,  ABD/GI: Normal bowel sounds; non-distended; soft, non-tender, no rebound,  no guarding BACK:  The back appears normal and is non-tender to palpation, there is no CVA tenderness EXT: Normal ROM in all joints; non-tender to palpation; no edema; normal capillary refill; no cyanosis    SKIN: Normal color for age and race; warm NEURO: Moves all extremities equally. Sensation to light touch intact, 5/5 strength in all extremities, cranial nerves 2-12 intact.  PSYCH: The patient's mood and manner are appropriate. Grooming and personal hygiene are appropriate.    MEDICAL DECISION MAKING: Patient here with migraine headache. Neurologically intact. Afebrile. Nontoxic. No meningismus. Treated with Reglan, Benadryl, IV fluids and Decadron. Headache completely gone after migraine cocktail. I do not feel she needs head imaging at this time. Discussed return precautions. She verbalized understanding and is comfortable with plan.       I personally performed the services described in this documentation, which was scribed in my presence. The recorded information has been reviewed and is accurate.     Layla Maw Shauntia Levengood, DO 01/21/15 2358

## 2015-05-24 ENCOUNTER — Encounter (HOSPITAL_COMMUNITY): Payer: Self-pay | Admitting: Emergency Medicine

## 2015-05-24 ENCOUNTER — Emergency Department (HOSPITAL_COMMUNITY)
Admission: EM | Admit: 2015-05-24 | Discharge: 2015-05-24 | Disposition: A | Discharge status: Home / Self Care | Payer: Self-pay | Attending: Emergency Medicine | Admitting: Emergency Medicine

## 2015-05-24 DIAGNOSIS — F419 Anxiety disorder, unspecified: Secondary | ICD-10-CM | POA: Insufficient documentation

## 2015-05-24 DIAGNOSIS — Z8669 Personal history of other diseases of the nervous system and sense organs: Secondary | ICD-10-CM | POA: Insufficient documentation

## 2015-05-24 DIAGNOSIS — J45909 Unspecified asthma, uncomplicated: Secondary | ICD-10-CM | POA: Insufficient documentation

## 2015-05-24 DIAGNOSIS — G8929 Other chronic pain: Secondary | ICD-10-CM | POA: Insufficient documentation

## 2015-05-24 DIAGNOSIS — Z87891 Personal history of nicotine dependence: Secondary | ICD-10-CM | POA: Insufficient documentation

## 2015-05-24 DIAGNOSIS — H53149 Visual discomfort, unspecified: Secondary | ICD-10-CM | POA: Insufficient documentation

## 2015-05-24 DIAGNOSIS — R110 Nausea: Secondary | ICD-10-CM | POA: Insufficient documentation

## 2015-05-24 DIAGNOSIS — I10 Essential (primary) hypertension: Secondary | ICD-10-CM | POA: Insufficient documentation

## 2015-05-24 DIAGNOSIS — R51 Headache: Secondary | ICD-10-CM | POA: Insufficient documentation

## 2015-05-24 DIAGNOSIS — Z79899 Other long term (current) drug therapy: Secondary | ICD-10-CM | POA: Insufficient documentation

## 2015-05-24 DIAGNOSIS — Z8639 Personal history of other endocrine, nutritional and metabolic disease: Secondary | ICD-10-CM | POA: Insufficient documentation

## 2015-05-24 DIAGNOSIS — R519 Headache, unspecified: Secondary | ICD-10-CM

## 2015-05-24 MED ORDER — METOCLOPRAMIDE HCL 5 MG/ML IJ SOLN
10.0000 mg | Freq: Once | INTRAMUSCULAR | Status: AC
  Administered 2015-05-24: 10 mg via INTRAMUSCULAR
  Filled 2015-05-24: qty 2

## 2015-05-24 MED ORDER — DIPHENHYDRAMINE HCL 50 MG/ML IJ SOLN
25.0000 mg | Freq: Once | INTRAMUSCULAR | Status: AC
  Administered 2015-05-24: 25 mg via INTRAMUSCULAR
  Filled 2015-05-24: qty 1

## 2015-05-24 NOTE — ED Notes (Signed)
Patient verbalizes understanding of discharge instructions, home care and follow up care. Patient ambulatory out of department at this time. 

## 2015-05-24 NOTE — ED Provider Notes (Signed)
CSN: [id number]     Arrival date & time 05/24/15  1806 History   First MD Initiated Contact with Patient 05/24/15 2013     Chief Complaint  Patient presents with  . Migraine     (Consider location/radiation/quality/duration/timing/severity/associated sxs/prior Treatment) HPI Comments: Patient claims a "migraine" it started around noon today. Patient states headache gradually worsened across both temples and the back of her head. Denies thunderclap onset. Similar to previous migraines. Took Tylenol without relief. Denies any nausea or vomiting. Denies any fever. Denies any visual change. Endorses photophobia and phonophobia. No focal weakness, numbness or tingling. No chest pain or shortness of breath. Denies thunderclap onset or fever.  The history is provided by the patient.    Past Medical History  Diagnosis Date  . Migraine   . Back pain, chronic   . Hypertension   . High cholesterol   . Depression   . Anxiety   . Asthma    Past Surgical History  Procedure Laterality Date  . Cesarean section    . Tubal ligation    . Cholecystectomy     Family History  Problem Relation Age of Onset  . Seizures Mother   . Heart failure Mother   . Heart failure Father    History  Substance Use Topics  . Smoking status: Former Smoker -- 1.00 packs/day for 7 years    Types: Cigarettes    Quit date: 07/17/2012  . Smokeless tobacco: Never Used  . Alcohol Use: No   OB History    Gravida Para Term Preterm AB TAB SAB Ectopic Multiple Living   Review of Systems  Constitutional: Negative for fever, activity change and appetite change.  HENT: Negative for congestion and rhinorrhea.   Eyes: Positive for photophobia.  Respiratory: Negative for cough, chest tightness and shortness of breath.   Cardiovascular: Negative for chest pain.  Gastrointestinal: Positive for nausea. Negative for vomiting and abdominal pain.  Genitourinary: Negative for vaginal bleeding and vaginal  discharge.  Musculoskeletal: Negative for myalgias and arthralgias.  Skin: Negative for rash.  Neurological: Positive for headaches. Negative for dizziness, weakness and light-headedness.  A complete 10 system review of systems was obtained and all systems are negative except as noted in the HPI and PMH.      Allergies  Salami; Aspirin; Bee venom; Compazine; Imitrex; Nubain; Prednisone; Thorazine; Voltaren; and Zofran  Home Medications   Prior to Admission medications   Medication Sig Start Date End Date Taking? Authorizing Provider  acetaminophen (TYLENOL) 500 MG tablet Take 500-1,000 mg by mouth every 6 (six) hours as needed for pain.    Historical Provider, MD  acetaminophen-codeine (TYLENOL #3) 300-30 MG per tablet Take 1 tablet by mouth every 6 (six) hours as needed for moderate pain. 11/20/13   Ivery Quale, PA-C  albuterol (PROVENTIL HFA;VENTOLIN HFA) 108 (90 BASE) MCG/ACT inhaler Inhale 2 puffs into the lungs every 6 (six) hours as needed for wheezing or shortness of breath.     Historical Provider, MD  LORazepam (ATIVAN) 1 MG tablet Take 1 tablet (1 mg total) by mouth 2 (two) times daily as needed for anxiety. 11/27/13   Donnetta Hutching, MD   BP 156/90 mmHg  Pulse 88  Temp(Src) 98 F (36.7 C) (Oral)  Resp 20  Ht  (1.626 m)  Wt 182 lb (82.555 kg)  BMI 31.22 kg/m2  SpO2 96%  LMP 05/24/2015 Physical Exam  Constitutional:  She is oriented to person, place, and time. She appears well-developed and well-nourished. No distress.  HENT:  Head: Normocephalic and atraumatic.  Mouth/Throat: Oropharynx is clear and moist. No oropharyngeal exudate.  No temporal artery tenderness  Eyes: Conjunctivae and EOM are normal. Pupils are equal, round, and reactive to light.  Neck: Normal range of motion. Neck supple.  No meningismus.  Cardiovascular: Normal rate, regular rhythm, normal heart sounds and intact distal pulses.   No murmur heard. Pulmonary/Chest: Effort normal and breath sounds  normal. No respiratory distress.  Abdominal: Soft. There is no tenderness. There is no rebound and no guarding.  Musculoskeletal: Normal range of motion. She exhibits no edema or tenderness.  Neurological: She is alert and oriented to person, place, and time. No cranial nerve deficit. She exhibits normal muscle tone. Coordination normal.  No ataxia on finger to nose bilaterally. No pronator drift. 5/5 strength throughout. CN 2-12 intact. Negative Romberg. Equal grip strength. Sensation intact. Gait is normal.   Skin: Skin is warm.  Psychiatric: She has a normal mood and affect. Her behavior is normal.  Nursing note and vitals reviewed.   ED Course  Procedures (including critical care time) Labs Review Labs Reviewed - No data to display  Imaging Review No results found.   EKG Interpretation None      MDM   Final diagnoses:  Headache, unspecified headache type   Gradual onset headache similar to previous migraines with photophobia and phonophobia. No neurological deficit.  Low suspicion for SAH, meningitis, temporal arteritis.  Patient treated with reglan and benadryl and PO fluids.  Reports improvement in headache.  Requesting discharge.  No indication for neuroimaging.  Stable for outpatient followup with neurology. Return precautions discussed.     Glynn Octave, MD 05/24/15 [phone]

## 2015-05-24 NOTE — ED Notes (Signed)
Patient ambulatory to and from restroom without deficit 

## 2015-05-24 NOTE — ED Notes (Signed)
Patient c/o headache with gradual onset today while watching television. Patient states "it feels like my usual headaches"

## 2015-05-24 NOTE — Discharge Instructions (Signed)

## 2015-05-24 NOTE — ED Notes (Signed)
Patient c/o migraine headache in which she has hx of. Per patient took extra strength tylenol with no relief. Patient reports nausea with sensitivity to light and sounds. Denies any blurred vision, facial drooping, weakness, dizzines, or slurred speech. No neurological deficits noted.

## 2015-07-22 ENCOUNTER — Encounter (HOSPITAL_COMMUNITY): Payer: Self-pay | Admitting: Emergency Medicine

## 2015-07-22 ENCOUNTER — Emergency Department (HOSPITAL_COMMUNITY)
Admission: EM | Admit: 2015-07-22 | Discharge: 2015-07-23 | Disposition: A | Discharge status: Home / Self Care | Payer: Self-pay | Attending: Emergency Medicine | Admitting: Emergency Medicine

## 2015-07-22 DIAGNOSIS — Z720 Tobacco use: Secondary | ICD-10-CM | POA: Insufficient documentation

## 2015-07-22 DIAGNOSIS — I10 Essential (primary) hypertension: Secondary | ICD-10-CM | POA: Insufficient documentation

## 2015-07-22 DIAGNOSIS — Z79899 Other long term (current) drug therapy: Secondary | ICD-10-CM | POA: Insufficient documentation

## 2015-07-22 DIAGNOSIS — G43019 Migraine without aura, intractable, without status migrainosus: Secondary | ICD-10-CM

## 2015-07-22 DIAGNOSIS — G8929 Other chronic pain: Secondary | ICD-10-CM | POA: Insufficient documentation

## 2015-07-22 DIAGNOSIS — J45909 Unspecified asthma, uncomplicated: Secondary | ICD-10-CM | POA: Insufficient documentation

## 2015-07-22 DIAGNOSIS — Z8639 Personal history of other endocrine, nutritional and metabolic disease: Secondary | ICD-10-CM | POA: Insufficient documentation

## 2015-07-22 DIAGNOSIS — F419 Anxiety disorder, unspecified: Secondary | ICD-10-CM | POA: Insufficient documentation

## 2015-07-22 NOTE — ED Notes (Signed)
   07/22/15 2356  Neurological  Neuro (WDL) X  Level of Consciousness Alert  Orientation Level Oriented X4  Cognition Appropriate at baseline  Speech Clear  Pt c/o pain at bilateral temporals and base of skull all day with nausea. Pt denies any vomiting.

## 2015-07-22 NOTE — ED Notes (Signed)
Pt c/o headache with nausea all day.

## 2015-07-23 MED ORDER — METOCLOPRAMIDE HCL 5 MG/ML IJ SOLN
10.0000 mg | Freq: Once | INTRAMUSCULAR | Status: AC
  Administered 2015-07-23: 10 mg via INTRAVENOUS
  Filled 2015-07-23: qty 2

## 2015-07-23 MED ORDER — DEXAMETHASONE SODIUM PHOSPHATE 4 MG/ML IJ SOLN
10.0000 mg | Freq: Once | INTRAMUSCULAR | Status: AC
  Administered 2015-07-23: 10 mg via INTRAVENOUS
  Filled 2015-07-23: qty 3

## 2015-07-23 MED ORDER — SODIUM CHLORIDE 0.9 % IV SOLN
Freq: Once | INTRAVENOUS | Status: AC
  Administered 2015-07-23: 01:00:00 via INTRAVENOUS

## 2015-07-23 MED ORDER — DIPHENHYDRAMINE HCL 50 MG/ML IJ SOLN
25.0000 mg | Freq: Once | INTRAMUSCULAR | Status: AC
  Administered 2015-07-23: 25 mg via INTRAVENOUS
  Filled 2015-07-23: qty 1

## 2015-07-23 NOTE — ED Provider Notes (Signed)
CSN: [id number]     Arrival date & time 07/22/15  2340 History   First MD Initiated Contact with Patient 07/22/15 2357     Chief Complaint  Patient presents with  . Headache     (Consider location/radiation/quality/duration/timing/severity/associated sxs/prior Treatment) Patient is a 44 y.o. female presenting with headaches. The history is provided by the patient. No language interpreter was used.  Headache Pain location:  R temporal, L temporal and frontal Radiates to:  Does not radiate Severity currently:  9/10 Onset quality:  Gradual Duration:  10 hours Timing:  Constant Progression:  Unchanged Chronicity:  New Similar to prior headaches: yes   Relieved by:  Nothing Worsened by:  Light and sound Ineffective treatments:  Acetaminophen Associated symptoms: nausea    Beverly Tran is a 44 y.o. female who presents to the ED with headache and nausea that started earlier today and has persisted despite taking extra strength tylenol. She denies change in vision, neck pain or other problems. She reports that this is the same as her usual headaches.   Past Medical History  Diagnosis Date  . Migraine   . Back pain, chronic   . Hypertension   . High cholesterol   . Depression   . Anxiety   . Asthma    Past Surgical History  Procedure Laterality Date  . Cesarean section    . Tubal ligation    . Cholecystectomy     Family History  Problem Relation Age of Onset  . Seizures Mother   . Heart failure Mother   . Heart failure Father    Social History  Substance Use Topics  . Smoking status: Current Every Day Smoker -- 1.00 packs/day for 7 years    Types: Cigarettes    Last Attempt to Quit: 07/17/2012  . Smokeless tobacco: Never Used  . Alcohol Use: No   OB History    Gravida Para Term Preterm AB TAB SAB Ectopic Multiple Living   Review of Systems  Gastrointestinal: Positive for nausea.  Neurological: Positive for headaches.  all other systems  negative    Allergies  Salami; Aspirin; Bee venom; Compazine; Imitrex; Nubain; Prednisone; Thorazine; Voltaren; and Zofran  Home Medications   Prior to Admission medications   Medication Sig Start Date End Date Taking? Authorizing Provider  acetaminophen (TYLENOL) 500 MG tablet Take 500-1,000 mg by mouth every 6 (six) hours as needed for pain.   Yes Historical Provider, MD  albuterol (PROVENTIL HFA;VENTOLIN HFA) 108 (90 BASE) MCG/ACT inhaler Inhale 2 puffs into the lungs every 6 (six) hours as needed for wheezing or shortness of breath.    Yes Historical Provider, MD  acetaminophen-codeine (TYLENOL #3) 300-30 MG per tablet Take 1 tablet by mouth every 6 (six) hours as needed for moderate pain. 11/20/13   Ivery Quale, PA-C  LORazepam (ATIVAN) 1 MG tablet Take 1 tablet (1 mg total) by mouth 2 (two) times daily as needed for anxiety. 11/27/13   Donnetta Hutching, MD   BP 160/93 mmHg  Pulse 85  Temp(Src) 98.2 F (36.8 C)  Resp 18  Ht  (1.651 m)  Wt 192 lb (87.091 kg)  BMI 31.95 kg/m2  SpO2 98%  LMP 07/19/2015 Physical Exam  Constitutional: She is oriented to person, place, and time. She appears well-developed and well-nourished. No distress.  HENT:  Head: Normocephalic and atraumatic.  Right Ear: Tympanic membrane normal.  Left Ear: Tympanic membrane  normal.  Nose: Nose normal.  Mouth/Throat: Uvula is midline, oropharynx is clear and moist and mucous membranes are normal.  Eyes: Conjunctivae and EOM are normal. Pupils are equal, round, and reactive to light.  Neck: Normal range of motion. Neck supple.  Cardiovascular: Normal rate and regular rhythm.   Pulmonary/Chest: Effort normal. She has no wheezes. She has no rales.  Abdominal: Soft. Bowel sounds are normal. She exhibits no mass. There is no tenderness.  Musculoskeletal: She exhibits no edema.  Radial and pedal pulses strong, adequate circulation, good touch sensation.  Neurological: She is alert and oriented to person, place,  and time. She has normal strength. No cranial nerve deficit or sensory deficit. She displays a negative Romberg sign. Gait normal.  Reflex Scores:      Bicep reflexes are 2+ on the right side and 2+ on the left side.      Brachioradialis reflexes are 2+ on the right side and 2+ on the left side.      Patellar reflexes are 2+ on the right side and 2+ on the left side.      Achilles reflexes are 2+ on the right side and 2+ on the left side. Rapid alternating movement without difficulty. Stands on one foot without difficulty.  Skin: Skin is warm and dry.  Psychiatric: She has a normal mood and affect. Her behavior is normal.  Nursing note and vitals reviewed.   ED Course  Procedures  IV fluids, Benadryl 25 mg, Decadron 10 mg, Reglan 10 mg IV  MDM  44 y.o. female with headache that has been persistent all day. Stable for d/c without focal neuro deficits. Headache resolved with medication and IV fluids. Will d/c home and she will follow up with her PCP or return here for worsening symptoms. Discussed with the patient and all questioned fully answered.   Final diagnoses:  Intractable migraine without aura and without status migrainosus        [facility], NP 07/23/15 0123  Dione Booze, MD 07/23/15 [phone]

## 2016-10-12 ENCOUNTER — Encounter (HOSPITAL_COMMUNITY): Payer: Self-pay | Admitting: Emergency Medicine

## 2016-10-12 ENCOUNTER — Emergency Department (HOSPITAL_COMMUNITY)
Admission: EM | Admit: 2016-10-12 | Discharge: 2016-10-12 | Disposition: A | Discharge status: Home / Self Care | Payer: Self-pay | Attending: Emergency Medicine | Admitting: Emergency Medicine

## 2016-10-12 DIAGNOSIS — I10 Essential (primary) hypertension: Secondary | ICD-10-CM | POA: Insufficient documentation

## 2016-10-12 DIAGNOSIS — M5136 Other intervertebral disc degeneration, lumbar region: Secondary | ICD-10-CM | POA: Insufficient documentation

## 2016-10-12 DIAGNOSIS — J45909 Unspecified asthma, uncomplicated: Secondary | ICD-10-CM | POA: Insufficient documentation

## 2016-10-12 DIAGNOSIS — F17210 Nicotine dependence, cigarettes, uncomplicated: Secondary | ICD-10-CM | POA: Insufficient documentation

## 2016-10-12 DIAGNOSIS — Z79899 Other long term (current) drug therapy: Secondary | ICD-10-CM | POA: Insufficient documentation

## 2016-10-12 MED ORDER — DEXAMETHASONE SODIUM PHOSPHATE 4 MG/ML IJ SOLN
8.0000 mg | Freq: Once | INTRAMUSCULAR | Status: AC
  Administered 2016-10-12: 8 mg via INTRAMUSCULAR
  Filled 2016-10-12: qty 2

## 2016-10-12 MED ORDER — ACETAMINOPHEN 500 MG PO TABS
1000.0000 mg | ORAL_TABLET | Freq: Once | ORAL | Status: AC
  Administered 2016-10-12: 1000 mg via ORAL
  Filled 2016-10-12: qty 2

## 2016-10-12 MED ORDER — CYCLOBENZAPRINE HCL 10 MG PO TABS: 10.0000 mg | ORAL_TABLET | Freq: Three times a day (TID) | ORAL | 0 refills | Status: DC

## 2016-10-12 MED ORDER — DIAZEPAM 5 MG PO TABS
10.0000 mg | ORAL_TABLET | Freq: Once | ORAL | Status: AC
  Administered 2016-10-12: 10 mg via ORAL
  Filled 2016-10-12: qty 2

## 2016-10-12 NOTE — Discharge Instructions (Signed)
A heating pad to your back maybe helpful. Please use 2 extra strength Tylenol every 4 hours. Use Flexeril 3 times daily. This medication may cause drowsiness, please use it with caution. Please see Miss Muse for continued evaluation and pain management.

## 2016-10-12 NOTE — ED Triage Notes (Signed)
Patient complains of lower back pain. States history of bulging disk.

## 2016-10-12 NOTE — ED Provider Notes (Signed)
[facility]-EMERGENCY DEPT Provider Note   CSN: [id number] Arrival date & time: 10/12/16  1127     History   Chief Complaint Chief Complaint  Patient presents with  . Back Pain    HPI Beverly Tran is a 45 y.o. female.  Pt reports hx of "bulging disc" since 1998. She states she has not had surgery or PT.   The history is provided by the patient.  Back Pain   The current episode started more than 2 days ago. The problem occurs hourly. The problem has been gradually worsening. The pain is associated with no known injury. The pain is present in the lumbar spine. The quality of the pain is described as shooting and stabbing. The pain is moderate. The symptoms are aggravated by certain positions. The pain is the same all the time. Pertinent negatives include no chest pain, no fever, no abdominal pain, no bowel incontinence, no perianal numbness, no bladder incontinence, no dysuria and no tingling. She has tried bed rest and NSAIDs for the symptoms. The treatment provided no relief.    Past Medical History:  Diagnosis Date  . Anxiety   . Asthma   . Back pain, chronic   . Depression   . High cholesterol   . Hypertension   . Migraine     There are no active problems to display for this patient.   Past Surgical History:  Procedure Laterality Date  . CESAREAN SECTION    . CHOLECYSTECTOMY    . TUBAL LIGATION      OB History    Gravida Para Term Preterm AB Living   3 3 3     3    SAB TAB Ectopic Multiple Live Births                   Home Medications    Prior to Admission medications   Medication Sig Start Date End Date Taking? Authorizing Provider  acetaminophen (TYLENOL) 500 MG tablet Take 500-1,000 mg by mouth every 6 (six) hours as needed for pain.    Historical Provider, MD  acetaminophen-codeine (TYLENOL #3) 300-30 MG per tablet Take 1 tablet by mouth every 6 (six) hours as needed for moderate pain. 11/20/13   Ivery QualeHobson Yohanna Tow, PA-C  albuterol (PROVENTIL HFA;VENTOLIN  HFA) 108 (90 BASE) MCG/ACT inhaler Inhale 2 puffs into the lungs every 6 (six) hours as needed for wheezing or shortness of breath.     Historical Provider, MD  LORazepam (ATIVAN) 1 MG tablet Take 1 tablet (1 mg total) by mouth 2 (two) times daily as needed for anxiety. 11/27/13   Donnetta HutchingBrian Cook, MD    Family History Family History  Problem Relation Age of Onset  . Seizures Mother   . Heart failure Mother   . Heart failure Father     Social History Social History  Substance Use Topics  . Smoking status: Current Every Day Smoker    Packs/day: 1.00    Years: 7.00    Types: Cigarettes    Last attempt to quit: 07/17/2012  . Smokeless tobacco: Never Used  . Alcohol use No     Allergies   Salami [pickled meat]; Aspirin; Bee venom; Compazine [prochlorperazine]; Imitrex [sumatriptan]; Nubain [nalbuphine hcl]; Prednisone; Thorazine [chlorpromazine]; Voltaren [diclofenac sodium]; and Zofran [ondansetron hcl]   Review of Systems Review of Systems  Constitutional: Negative for activity change and fever.       All ROS Neg except as noted in HPI  HENT: Negative for nosebleeds.   Eyes:  Negative for photophobia and discharge.  Respiratory: Negative for cough, shortness of breath and wheezing.   Cardiovascular: Negative for chest pain and palpitations.  Gastrointestinal: Negative for abdominal pain, blood in stool and bowel incontinence.  Genitourinary: Negative for bladder incontinence, dysuria, frequency and hematuria.  Musculoskeletal: Positive for back pain. Negative for arthralgias and neck pain.  Skin: Negative.   Neurological: Negative for dizziness, tingling, seizures and speech difficulty.  Psychiatric/Behavioral: Negative for confusion and hallucinations. The patient is nervous/anxious.      Physical Exam Updated Vital Signs BP 128/76 (BP Location: Left Arm)   Pulse 109   Temp 97.7 F (36.5 C) (Oral)   Resp 16   Ht 5\' 4"  (1.626 m)   Wt 86.2 kg   LMP 10/13/2015   SpO2 99%    BMI 32.61 kg/m   Physical Exam  Constitutional: She is oriented to person, place, and time. She appears well-developed and well-nourished.  Non-toxic appearance.  HENT:  Head: Normocephalic.  Right Ear: Tympanic membrane and external ear normal.  Left Ear: Tympanic membrane and external ear normal.  Eyes: EOM and lids are normal. Pupils are equal, round, and reactive to light.  Neck: Normal range of motion. Neck supple. Carotid bruit is not present.  Cardiovascular: Normal rate, regular rhythm, normal heart sounds, intact distal pulses and normal pulses.   Pulmonary/Chest: Breath sounds normal. No respiratory distress.  Abdominal: Soft. Bowel sounds are normal. There is no tenderness. There is no guarding.  Musculoskeletal:       Lumbar back: She exhibits decreased range of motion, pain and spasm.  Lymphadenopathy:       Head (right side): No submandibular adenopathy present.       Head (left side): No submandibular adenopathy present.    She has no cervical adenopathy.  Neurological: She is alert and oriented to person, place, and time. She has normal strength. No cranial nerve deficit or sensory deficit.  Skin: Skin is warm and dry.  Psychiatric: She has a normal mood and affect. Her speech is normal.  Nursing note and vitals reviewed.    ED Treatments / Results  Labs (all labs ordered are listed, but only abnormal results are displayed) Labs Reviewed - No data to display  EKG  EKG Interpretation None       Radiology No results found.  Procedures Procedures (including critical care time)  Medications Ordered in ED Medications - No data to display   Initial Impression / Assessment and Plan / ED Course  I have reviewed the triage vital signs and the nursing notes.  Pertinent labs & imaging results that were available during my care of the patient were reviewed by me and considered in my medical decision making (see chart for details).  Clinical Course     *I  have reviewed nursing notes, vital signs, and all appropriate lab and imaging results for this patient.**  Final Clinical Impressions(s) / ED Diagnoses Who complains of lower back pain. She states that she has a history of bulging disc in her back since 1998. No gross neurologic deficit appreciated at this time. No evidence for caudal equina. There no other emergent back related situations noted. Pain improved after medication here in emergency department. I've asked the patient to use heating pad to her back, Tylenol every 4 hours, and a prescription was given for Flexeril. The patient was given an injection of Decadron here in the emergency department. The patient is to follow-up with Mrs. Muse for additional evaluation and  management of her pain.    Final diagnoses:  None    New Prescriptions New Prescriptions   No medications on file     Ivery QualeHobson Kerin Cecchi, PA-C 10/12/16 1445    Raeford RazorStephen Kohut, MD 10/14/16 1149

## 2020-02-17 ENCOUNTER — Institutional Professional Consult (permissible substitution): Payer: Self-pay | Admitting: Internal Medicine

## 2020-03-05 ENCOUNTER — Institutional Professional Consult (permissible substitution): Payer: Self-pay | Admitting: Internal Medicine

## 2020-03-25 ENCOUNTER — Other Ambulatory Visit: Payer: Self-pay | Admitting: Family Medicine

## 2020-03-25 DIAGNOSIS — R0602 Shortness of breath: Secondary | ICD-10-CM

## 2020-03-25 DIAGNOSIS — R059 Cough, unspecified: Secondary | ICD-10-CM

## 2020-04-02 ENCOUNTER — Ambulatory Visit: Payer: Self-pay | Admitting: Orthopaedic Surgery

## 2020-04-10 ENCOUNTER — Inpatient Hospital Stay: Admission: RE | Admit: 2020-04-10 | Payer: Self-pay | Source: Ambulatory Visit

## 2020-04-14 ENCOUNTER — Ambulatory Visit (INDEPENDENT_AMBULATORY_CARE_PROVIDER_SITE_OTHER): Payer: 59 | Admitting: Internal Medicine

## 2020-04-14 ENCOUNTER — Encounter: Payer: Self-pay | Admitting: Internal Medicine

## 2020-04-14 ENCOUNTER — Other Ambulatory Visit: Payer: Self-pay

## 2020-04-14 DIAGNOSIS — R058 Other specified cough: Secondary | ICD-10-CM

## 2020-04-14 DIAGNOSIS — I10 Essential (primary) hypertension: Secondary | ICD-10-CM | POA: Diagnosis not present

## 2020-04-14 DIAGNOSIS — R05 Cough: Secondary | ICD-10-CM

## 2020-04-14 MED ORDER — FAMOTIDINE 20 MG PO TABS: ORAL_TABLET | ORAL | 11 refills | Status: DC

## 2020-04-14 MED ORDER — VALSARTAN-HYDROCHLOROTHIAZIDE 160-12.5 MG PO TABS: 1.0000 | ORAL_TABLET | Freq: Every day | ORAL | 11 refills | Status: DC

## 2020-04-14 MED ORDER — HYDROCODONE-HOMATROPINE 5-1.5 MG/5ML PO SYRP: 5.0000 mL | ORAL_SOLUTION | ORAL | 0 refills | Status: DC | PRN

## 2020-04-14 NOTE — Progress Notes (Signed)
Beverly Tran, female    DOB: 11/19/1970,     MRN: [id number]   Brief patient profile:  49 yowf quit smoking 01/2020  "born with bronchitis"  (mother smoked)  On "breathing pill they quit making" then around age 49 changed to  inhalers as needed  and ever since and did fine s maint rx   until April 2021 with onset of cough refractory to all rx including  x for hydocodone so referred to pulmonary clinic 04/14/2020 by Dr   Sherryll Burger      History of Present Illness  04/14/2020  Pulmonary/ 1st office eval/Dajohn Ellender  S/p 2nd of moderna 04/02/20  Chief Complaint  Patient presents with  . Pulmonary Consult    Referred by Dr Sherryll Burger. Pt c/o cough x 2 months. Cough is non prod and worse at night. She has found the only thing that has helped is hydrocodone cough syrup.   Dyspnea:  Only problem is heat - does fine in a/c including housework  Cough:worse at hs / dry sometimes choking  Sleep: bed is flat 2 pillows  SABA use: not helping cough  maint now on singulair / not using symbicort// already on gabapentin 300 tid and lyrica  No obvious day to day or daytime variability or assoc excess/ purulent sputum or mucus plugs or hemoptysis or cp or chest tightness, subjective wheeze or overt sinus or hb symptoms.    . Also denies any obvious fluctuation of symptoms with weather or environmental changes or other aggravating or alleviating factors except as outlined above   No unusual exposure hx or h/o childhood pna/ asthma or knowledge of premature birth.  Current Allergies, Complete Past Medical History, Past Surgical History, Family History, and Social History were reviewed in [employer] record.  ROS  The following are not active complaints unless bolded Hoarseness, sore throat, dysphagia, dental problems, itching, sneezing,  nasal congestion or discharge of excess mucus or purulent secretions, ear ache,   fever, chills, sweats, unintended wt loss or wt gain, classically pleuritic or  exertional cp,  orthopnea pnd or arm/hand swelling  or leg swelling, presyncope, palpitations, abdominal pain, anorexia, nausea, vomiting, diarrhea  or change in bowel habits or change in bladder habits, change in stools or change in urine, dysuria, hematuria,  rash, arthralgias, visual complaints, headache, numbness, weakness or ataxia or problems with walking or coordination,  change in mood or  memory.           Past Medical History:  Diagnosis Date  . Anxiety   . Asthma   . Back pain, chronic   . Depression   . High cholesterol   . Hypertension   . Migraine     Outpatient Medications Prior to Visit  Medication Sig Dispense Refill  . albuterol (PROVENTIL HFA;VENTOLIN HFA) 108 (90 BASE) MCG/ACT inhaler Inhale 2 puffs into the lungs every 6 (six) hours as needed for wheezing or shortness of breath.     . baclofen (LIORESAL) 10 MG tablet Take 1 tablet by mouth at bedtime.    . benazepril-hydrochlorthiazide (LOTENSIN HCT) 10-12.5 MG tablet Take 1 tablet by mouth daily.    . budesonide-formoterol (SYMBICORT) 160-4.5 MCG/ACT inhaler Inhale 2 puffs into the lungs in the morning and at bedtime.    . butalbital-acetaminophen-caffeine (FIORICET) 50-325-40 MG tablet As needed for headaches    . gabapentin (NEURONTIN) 600 MG tablet Take 1 tablet by mouth in the morning, at noon, and at bedtime.    Marland Kitchen ipratropium-albuterol (DUONEB)  0.5-2.5 (3) MG/3ML SOLN 1 vial in neb every 4 hours as needed    . levocetirizine (XYZAL) 5 MG tablet Take 1 tablet by mouth daily.    Marland Kitchen LORazepam (ATIVAN) 1 MG tablet Take 1 tablet (1 mg total) by mouth 2 (two) times daily as needed for anxiety. 20 tablet 0  . meloxicam (MOBIC) 15 MG tablet Take 1 tablet by mouth daily.    . metFORMIN (GLUCOPHAGE) 500 MG tablet 2 in the am and 1 in the pm    . montelukast (SINGULAIR) 10 MG tablet Take 1 tablet by mouth daily.    . pramipexole (MIRAPEX) 0.125 MG tablet Take 1 tablet by mouth daily.    . pregabalin (LYRICA) 75 MG capsule  Take 1 capsule by mouth in the morning, at noon, and at bedtime.    . propranolol (INDERAL) 10 MG tablet 1 in the am and 2 in the pm    . rosuvastatin (CRESTOR) 5 MG tablet Take 1 tablet by mouth daily.    Marland Kitchen acetaminophen (TYLENOL) 500 MG tablet Take 500-1,000 mg by mouth every 6 (six) hours as needed for pain.    Marland Kitchen acetaminophen-codeine (TYLENOL #3) 300-30 MG per tablet Take 1 tablet by mouth every 6 (six) hours as needed for moderate pain. Not taking    . cyclobenzaprine (FLEXERIL) 10 MG tablet Take 1 tablet (10 mg total) by mouth 3 (three) times daily. 20 tablet 0      Objective:     BP 126/82 (BP Location: Left Arm, Cuff Size: Normal)   Pulse 87   Temp 98.7 F (37.1 C) (Oral)   Ht 5\' 3"  (1.6 m)   Wt 210 lb (95.3 kg)   SpO2 95% Comment: on RA  BMI 37.20 kg/m   SpO2: 95 % (on RA)   amb mod obese wf nad   HEENT : pt wearing mask not removed for exam due to covid -19 concerns.    NECK :  without JVD/Nodes/TM/ nl carotid upstrokes bilaterally   LUNGS: no acc muscle use,  Nl contour chest which is clear to A and P bilaterally without cough on insp or exp maneuvers   CV:  RRR  no s3 or murmur or increase in P2, and no edema   ABD:  Obese soft and nontender with nl inspiratory excursion in the supine position. No bruits or organomegaly appreciated, bowel sounds nl  MS:  Nl gait/ ext warm without deformities, calf tenderness, cyanosis or clubbing No obvious joint restrictions   SKIN: warm and dry without lesions    NEURO:  alert, approp, nl sensorium with  no motor or cerebellar deficits apparent.    Chest ct due 04/27/20      Assessment   Upper airway cough syndrome Onset was April 2021  - try off acei 04/14/2020 and pepcid 20 mg after supper  The most common causes of chronic cough in immunocompetent adults include the following: upper airway cough syndrome (UACS), previously referred to as postnasal drip syndrome (PNDS), which is caused by variety of rhinosinus  conditions; (2) asthma; (3) GERD; (4) chronic bronchitis from cigarette smoking or other inhaled environmental irritants; (5) nonasthmatic eosinophilic bronchitis; and (6) bronchiectasis.   These conditions, singly or in combination, have accounted for up to 94% of the causes of chronic cough in prospective studies.   Other conditions have constituted no >6% of the causes in prospective studies These have included bronchogenic carcinoma, chronic interstitial pneumonia, sarcoidosis, left ventricular failure, ACEI-induced cough, and aspiration  from a condition associated with pharyngeal dysfunction.    Chronic cough is often simultaneously caused by more than one condition. A single cause has been found from 38 to 82% of the time, multiple causes from 18 to 62%. Multiply caused cough has been the result of three diseases up to 42% of the time.       Lack of response to rx for AB including prednisone points strongly to Upper airway cough syndrome (previously labeled PNDS),  is so named because it's frequently impossible to sort out how much is  CR/sinusitis with freq throat clearing (which can be related to primary GERD)   vs  causing  secondary (" extra esophageal")  GERD from wide swings in gastric pressure that occur with throat clearing, often  promoting self use of mint and menthol lozenges that reduce the lower esophageal sphincter tone and exacerbate the problem further in a cyclical fashion.   These are the same pts (now being labeled as having "irritable larynx syndrome" by some cough centers) who not infrequently have a history of having failed to tolerate ace inhibitors,  dry powder inhalers or biphosphonates or report having atypical/extraesophageal reflux symptoms that don't respond to standard doses of PPI  and are easily confused as having aecopd or asthma flares by even experienced allergists/ pulmonologists (myself included).   >>> ACEi adverse effects at the  top of the usual list of  suspects and the only way to rule it out is a trial off > see hbp and in meantime suppress noct gerd with diet/ pepcid then add daytime ppi ac if still coughing.   Also eliminate cyclical cough with hydromet x 240 cc only and f/u in 6 weeks with CT chest already planned in meantime and hopefully do pfts on return once get cough under control as may have incipient copd though not clear it's really symptomatic and reinforced smoking cessation as the primary "maintenance"  treatment for now with prn saba/sama.        Essential hypertension D/c acei 04/14/2020 due to cough  In the best review of chronic cough to date ( NEJM 2016 375 [id number]) ,  ACEi are now felt to cause cough in up to  20% of pts which is a 4 fold increase from previous reports and does not include the variety of non-specific complaints we see in pulmonary clinic in pts on ACEi but previously attributed to another dx like  Copd/asthma and  include PNDS, throat and chest congestion, "bronchitis", unexplained dyspnea and noct "strangling" sensations, and hoarseness, but also  atypical /refractory GERD symptoms like dysphagia and "bad heartburn"   The only way I know  to prove this is not an "ACEi Case" is a trial off ACEi x a minimum of 6 weeks then regroup.   >>> try valsartan 160-12.5 mg one daily     Medical decision making was a moderate level of complexity in this case because of  two chronic conditions /diagnoses requiring extra time for  H and P, chart review, counseling,  and generating customized AVS unique to this office visit and charting.   Each maintenance medication was reviewed in detail including emphasizing most importantly the difference between maintenance and prns and under what circumstances the prns are to be triggered using an action plan format where appropriate. Please see avs for details which were reviewed in writing by both me and my nurse and patient given a written copy highlighted where appropriate  with yellow highlighter for the patient's  continued care at home along with an updated version of their medications.  Patient was asked to maintain medication reconciliation by comparing this list to the actual medications being used at home and to contact this office right away if there is a conflict or discrepancy.     Sandrea Hughs, MD 04/14/2020

## 2020-04-14 NOTE — Patient Instructions (Addendum)
Valsartan 160-12.5 one daily instead of lotensin   Add pepcid 20 mg one after supper  until return   GERD (REFLUX)  is an extremely common cause of respiratory symptoms just like yours , many times with no obvious heartburn at all.    It can be treated with medication, but also with lifestyle changes including elevation of the head of your bed (ideally with 6 -8inch blocks under the headboard of your bed),  Smoking cessation, avoidance of late meals, excessive alcohol, and avoid fatty foods, chocolate, peppermint, colas, red wine, and acidic juices such as orange juice.  NO MINT OR MENTHOL PRODUCTS SO NO COUGH DROPS  USE SUGARLESS CANDY INSTEAD (Jolley ranchers or Stover's or Life Savers) or even ice chips will also do - the key is to swallow to prevent all throat clearing. NO OIL BASED VITAMINS - use powdered substitutes.  Avoid fish oil when coughing.    For cough > hydrocodone up to a tsp every 4 hours as needed but should be better w/in 2 weeks   Please schedule a follow up office visit in 6 weeks, call sooner if needed with all medications /inhalers/ solutions in hand so we can verify exactly what you are taking. This includes all medications from all doctors and over the counters

## 2020-04-14 NOTE — Assessment & Plan Note (Addendum)
Onset was April 2021  - try off acei 04/14/2020 and pepcid 20 mg after supper  The most common causes of chronic cough in immunocompetent adults include the following: upper airway cough syndrome (UACS), previously referred to as postnasal drip syndrome (PNDS), which is caused by variety of rhinosinus conditions; (2) asthma; (3) GERD; (4) chronic bronchitis from cigarette smoking or other inhaled environmental irritants; (5) nonasthmatic eosinophilic bronchitis; and (6) bronchiectasis.   These conditions, singly or in combination, have accounted for up to 94% of the causes of chronic cough in prospective studies.   Other conditions have constituted no >6% of the causes in prospective studies These have included bronchogenic carcinoma, chronic interstitial pneumonia, sarcoidosis, left ventricular failure, ACEI-induced cough, and aspiration from a condition associated with pharyngeal dysfunction.    Chronic cough is often simultaneously caused by more than one condition. A single cause has been found from 38 to 82% of the time, multiple causes from 18 to 62%. Multiply caused cough has been the result of three diseases up to 42% of the time.       Lack of response to rx for AB including prednisone points strongly to Upper airway cough syndrome (previously labeled PNDS),  is so named because it's frequently impossible to sort out how much is  CR/sinusitis with freq throat clearing (which can be related to primary GERD)   vs  causing  secondary (" extra esophageal")  GERD from wide swings in gastric pressure that occur with throat clearing, often  promoting self use of mint and menthol lozenges that reduce the lower esophageal sphincter tone and exacerbate the problem further in a cyclical fashion.   These are the same pts (now being labeled as having "irritable larynx syndrome" by some cough centers) who not infrequently have a history of having failed to tolerate ace inhibitors,  dry powder inhalers or  biphosphonates or report having atypical/extraesophageal reflux symptoms that don't respond to standard doses of PPI  and are easily confused as having aecopd or asthma flares by even experienced allergists/ pulmonologists (myself included).   >>> ACEi adverse effects at the  top of the usual list of suspects and the only way to rule it out is a trial off > see hbp and in meantime suppress noct gerd with diet/ pepcid then add daytime ppi ac if still coughing.   Also eliminate cyclical cough with hydromet x 240 cc only and f/u in 6 weeks with CT chest already planned in meantime and hopefully do pfts on return once get cough under control as may have incipient copd though not clear it's really symptomatic and reinforced smoking cessation as the primary "maintenance"  treatment for now with prn saba/sama.

## 2020-04-14 NOTE — Assessment & Plan Note (Signed)
D/c acei 04/14/2020 due to cough  In the best review of chronic cough to date ( NEJM 2016 375 [id number]) ,  ACEi are now felt to cause cough in up to  20% of pts which is a 4 fold increase from previous reports and does not include the variety of non-specific complaints we see in pulmonary clinic in pts on ACEi but previously attributed to another dx like  Copd/asthma and  include PNDS, throat and chest congestion, "bronchitis", unexplained dyspnea and noct "strangling" sensations, and hoarseness, but also  atypical /refractory GERD symptoms like dysphagia and "bad heartburn"   The only way I know  to prove this is not an "ACEi Case" is a trial off ACEi x a minimum of 6 weeks then regroup.   >>> try valsartan 160-12.5 mg one daily   Medical decision making was a moderate level of complexity in this case because of  two chronic conditions /diagnoses requiring extra time for  H and P, chart review, counseling,  and generating customized AVS unique to this office visit and charting.   Each maintenance medication was reviewed in detail including emphasizing most importantly the difference between maintenance and prns and under what circumstances the prns are to be triggered using an action plan format where appropriate. Please see avs for details which were reviewed in writing by both me and my nurse and patient given a written copy highlighted where appropriate with yellow highlighter for the patient's continued care at home along with an updated version of their medications.  Patient was asked to maintain medication reconciliation by comparing this list to the actual medications being used at home and to contact this office right away if there is a conflict or discrepancy.

## 2020-04-21 ENCOUNTER — Ambulatory Visit: Payer: Self-pay | Admitting: Orthopaedic Surgery

## 2020-04-27 ENCOUNTER — Inpatient Hospital Stay: Admission: RE | Admit: 2020-04-27 | Payer: Self-pay | Source: Ambulatory Visit

## 2020-05-28 ENCOUNTER — Ambulatory Visit: Payer: 59 | Admitting: Internal Medicine

## 2020-08-28 ENCOUNTER — Other Ambulatory Visit (HOSPITAL_BASED_OUTPATIENT_CLINIC_OR_DEPARTMENT_OTHER): Payer: Self-pay

## 2020-08-28 DIAGNOSIS — G4733 Obstructive sleep apnea (adult) (pediatric): Secondary | ICD-10-CM

## 2020-09-21 ENCOUNTER — Ambulatory Visit: Payer: 59 | Attending: Neurology | Admitting: Neurology

## 2020-09-21 ENCOUNTER — Other Ambulatory Visit: Payer: Self-pay

## 2020-09-21 ENCOUNTER — Encounter (INDEPENDENT_AMBULATORY_CARE_PROVIDER_SITE_OTHER): Payer: Self-pay

## 2020-09-21 DIAGNOSIS — G4734 Idiopathic sleep related nonobstructive alveolar hypoventilation: Secondary | ICD-10-CM | POA: Insufficient documentation

## 2020-09-21 DIAGNOSIS — G4733 Obstructive sleep apnea (adult) (pediatric): Secondary | ICD-10-CM

## 2020-09-22 NOTE — Procedures (Signed)
[facility] Macrina Lehnert A. Gerilyn Pilgrim, MD     [email or web address]             NOCTURNAL POLYSOMNOGRAPHY   LOCATION: [facility]   Patient Name: Beverly Tran, Beverly Tran Date: 09/21/2020 Gender: Female D.O.B: [birth year] Age (years): 49 Referring Provider: Jorge Mandril NP Height (inches): 63 Interpreting Physician: Beryle Beams MD, ABSM Weight (lbs): 216 RPSGT: Alfonso Ellis BMI: 38 MRN: [id number] Neck Size: 16.00 <br> <br> CLINICAL INFORMATION Sleep Study Type: NPSG    Indication for sleep study: N/A    Epworth Sleepiness Score: 2    SLEEP STUDY TECHNIQUE As per the AASM Manual for the Scoring of Sleep and Associated Events v2.3 (April 2016) with a hypopnea requiring 4% desaturations.  The channels recorded and monitored were frontal, central and occipital EEG, electrooculogram (EOG), submentalis EMG (chin), nasal and oral airflow, thoracic and abdominal wall motion, anterior tibialis EMG, snore microphone, electrocardiogram, and pulse oximetry.  MEDICATIONS Medications self-administered by patient taken the night of the study : N/A  Current Outpatient Medications:  .  albuterol (PROVENTIL HFA;VENTOLIN HFA) 108 (90 BASE) MCG/ACT inhaler, Inhale 2 puffs into the lungs every 6 (six) hours as needed for wheezing or shortness of breath. , Disp: , Rfl:  .  baclofen (LIORESAL) 10 MG tablet, Take 1 tablet by mouth at bedtime., Disp: , Rfl:  .  butalbital-acetaminophen-caffeine (FIORICET) 50-325-40 MG tablet, As needed for headaches, Disp: , Rfl:  .  famotidine (PEPCID) 20 MG tablet, One after supper, Disp: 30 tablet, Rfl: 11 .  gabapentin (NEURONTIN) 600 MG tablet, Take 1 tablet by mouth in the morning, at noon, and at bedtime., Disp: , Rfl:  .  HYDROcodone-homatropine (HYCODAN) 5-1.5 MG/5ML syrup, Take 5 mLs by mouth every 4 (four) hours as needed for cough., Disp: 240 mL, Rfl: 0 .  ipratropium-albuterol (DUONEB) 0.5-2.5 (3) MG/3ML SOLN, 1 vial in neb every 4  hours as needed, Disp: , Rfl:  .  levocetirizine (XYZAL) 5 MG tablet, Take 1 tablet by mouth daily., Disp: , Rfl:  .  LORazepam (ATIVAN) 1 MG tablet, Take 1 tablet (1 mg total) by mouth 2 (two) times daily as needed for anxiety., Disp: 20 tablet, Rfl: 0 .  meloxicam (MOBIC) 15 MG tablet, Take 1 tablet by mouth daily., Disp: , Rfl:  .  metFORMIN (GLUCOPHAGE) 500 MG tablet, 2 in the am and 1 in the pm, Disp: , Rfl:  .  montelukast (SINGULAIR) 10 MG tablet, Take 1 tablet by mouth daily., Disp: , Rfl:  .  pramipexole (MIRAPEX) 0.125 MG tablet, Take 1 tablet by mouth daily., Disp: , Rfl:  .  pregabalin (LYRICA) 75 MG capsule, Take 1 capsule by mouth in the morning, at noon, and at bedtime., Disp: , Rfl:  .  propranolol (INDERAL) 10 MG tablet, 1 in the am and 2 in the pm, Disp: , Rfl:  .  rosuvastatin (CRESTOR) 5 MG tablet, Take 1 tablet by mouth daily., Disp: , Rfl:  .  valsartan-hydrochlorothiazide (DIOVAN HCT) 160-12.5 MG tablet, Take 1 tablet by mouth daily., Disp: 30 tablet, Rfl: 11    SLEEP ARCHITECTURE The study was initiated at 10:16:37 PM and ended at 4:45:40 AM.  Sleep onset time was 80.8 minutes and the sleep efficiency was 78.3%. The total sleep time was 304.8 minutes.  Stage REM latency was 255.5 minutes.  The patient spent 0.33% of the night in stage N1 sleep, 47.41% in stage N2 sleep, 47.01% in stage N3 and 5.3% in REM.  Alpha intrusion  was absent.  Supine sleep was 54.30%.  RESPIRATORY PARAMETERS The overall apnea/hypopnea index (AHI) was 0.6 per hour. There were 0 total apneas, including 0 obstructive, 0 central and 0 mixed apneas. There were 3 hypopneas and 0 RERAs.  The AHI during Stage REM sleep was 0.0 per hour.  AHI while supine was 1.1 per hour.  The mean oxygen saturation was 82.31%. The minimum SpO2 during sleep was 74.00%. There are prolonged periods of desaturations without apneic events diagnostic of hypoventilation syndrome. The total time with desaturation  less or equal to 88% is 365 minutes.  moderate snoring was noted during this study.   CARDIAC DATA The 2 lead EKG demonstrated sinus rhythm. The mean heart rate was 73.86 beats per minute. Other EKG findings include: None.  LEG MOVEMENT DATA The total PLMS were 0 with a resulting PLMS index of 0.00. Associated arousal with leg movement index was 0.0.  IMPRESSIONS - Hypoventilation syndrome is noted. Nocturnal oxygen 2 L is recommended.  - No significant obstructive sleep apnea occurred during this study. - No significant central sleep apnea occurred during this study.  Argie Ramming, MD Diplomate, American Board of Sleep Medicine.  ELECTRONICALLY SIGNED ON:  09/22/2020, 6:48 PM [facility] PH: [phone]   FX: [phone] ACCREDITED BY THE AMERICAN ACADEMY OF SLEEP MEDICINE

## 2020-09-30 ENCOUNTER — Encounter: Payer: Self-pay | Admitting: Internal Medicine

## 2020-10-30 ENCOUNTER — Other Ambulatory Visit (HOSPITAL_COMMUNITY): Payer: Self-pay | Admitting: Neurology

## 2020-10-30 ENCOUNTER — Other Ambulatory Visit: Payer: Self-pay | Admitting: Neurology

## 2020-10-30 DIAGNOSIS — M5416 Radiculopathy, lumbar region: Secondary | ICD-10-CM

## 2020-11-11 ENCOUNTER — Ambulatory Visit (HOSPITAL_COMMUNITY): Payer: 59

## 2020-11-13 ENCOUNTER — Ambulatory Visit: Payer: 59 | Admitting: Gastroenterology

## 2020-11-20 ENCOUNTER — Encounter (HOSPITAL_COMMUNITY): Payer: Self-pay

## 2020-11-20 ENCOUNTER — Ambulatory Visit (HOSPITAL_COMMUNITY): Payer: 59

## 2020-11-23 ENCOUNTER — Ambulatory Visit: Payer: 59 | Admitting: Internal Medicine

## 2020-11-23 NOTE — Progress Notes (Incomplete)
Beverly Tran, female    DOB: 07/31/1971,     MRN: [id number]   Brief patient profile:  49 yowf quit smoking 01/2020  "born with bronchitis"  (mother smoked)  On "breathing pill they quit making" then around age 50 changed to  inhalers as needed  and ever since and did fine s maint rx   until April 2021 with onset of cough refractory to all rx including  x for hydocodone so referred to pulmonary clinic 04/14/2020 by Dr   Sherryll Burger      History of Present Illness  04/14/2020  Pulmonary/ 1st office eval/Ranjit Ashurst  S/p 2nd of moderna 04/02/20  Chief Complaint  Patient presents with  . Pulmonary Consult    Referred by Dr Sherryll Burger. Pt c/o cough x 2 months. Cough is non prod and worse at night. She has found the only thing that has helped is hydrocodone cough syrup.   Dyspnea:  Only problem is heat - does fine in a/c including housework  Cough:worse at hs / dry sometimes choking  Sleep: bed is flat 2 pillows  SABA use: not helping cough  maint now on singulair / not using symbicort// already on gabapentin 300 tid and lyrica rec Valsartan 160-12.5 one daily instead of lotensin Add pepcid 20 mg one after supper  until return  GERD diet/ bedblocks  For cough > hydrocodone up to a tsp every 4 hours as needed but should be better w/in 2 weeks Please schedule a follow up office visit in 6 weeks, call sooner if needed with all medications /inhalers/ solutions in hand so we can verify exactly what you are taking. This includes all medications from all doctors and over the counters    11/23/2020  f/u ov/[place] office/Sajad Glander re:  No chief complaint on file.    Dyspnea:  *** Cough: *** Sleeping: *** SABA use: *** 02: *** Covid status: *** Lung cancer screening: ***   No obvious day to day or daytime variability or assoc excess/ purulent sputum or mucus plugs or hemoptysis or cp or chest tightness, subjective wheeze or overt sinus or hb symptoms.   *** without nocturnal  or early am exacerbation  of  respiratory  c/o's or need for noct saba. Also denies any obvious fluctuation of symptoms with weather or environmental changes or other aggravating or alleviating factors except as outlined above   No unusual exposure hx or h/o childhood pna/ asthma or knowledge of premature birth.  Current Allergies, Complete Past Medical History, Past Surgical History, Family History, and Social History were reviewed in [employer] [facility] record.  ROS  The following are not active complaints unless bolded Hoarseness, sore throat, dysphagia, dental problems, itching, sneezing,  nasal congestion or discharge of excess mucus or purulent secretions, ear ache,   fever, chills, sweats, unintended wt loss or wt gain, classically pleuritic or exertional cp,  orthopnea pnd or arm/hand swelling  or leg swelling, presyncope, palpitations, abdominal pain, anorexia, nausea, vomiting, diarrhea  or change in bowel habits or change in bladder habits, change in stools or change in urine, dysuria, hematuria,  rash, arthralgias, visual complaints, headache, numbness, weakness or ataxia or problems with walking or coordination,  change in mood or  memory.        No outpatient medications have been marked as taking for the 11/23/20 encounter (Appointment) with Nyoka Cowden, MD.                    Past Medical  History:  Diagnosis Date  . Anxiety   . Asthma   . Back pain, chronic   . Depression   . High cholesterol   . Hypertension   . Migraine          Objective:       Wt Readings from Last 3 Encounters:  04/14/20 210 lb (95.3 kg)  10/12/16 190 lb (86.2 kg)  07/22/15 192 lb (87.1 kg)      Vital signs reviewed  11/23/2020  - Note at rest 02 sats  ***% on ***   General appearance:    ***      Chest ct due 04/27/20      Assessment

## 2020-11-25 ENCOUNTER — Telehealth: Payer: Self-pay | Admitting: Internal Medicine

## 2020-11-25 ENCOUNTER — Encounter: Payer: Self-pay | Admitting: Internal Medicine

## 2020-11-25 ENCOUNTER — Ambulatory Visit: Payer: 59 | Admitting: Internal Medicine

## 2020-11-25 ENCOUNTER — Other Ambulatory Visit: Payer: Self-pay

## 2020-11-25 ENCOUNTER — Ambulatory Visit (INDEPENDENT_AMBULATORY_CARE_PROVIDER_SITE_OTHER): Payer: 59 | Admitting: Internal Medicine

## 2020-11-25 DIAGNOSIS — R058 Other specified cough: Secondary | ICD-10-CM

## 2020-11-25 MED ORDER — PREDNISONE 10 MG PO TABS: ORAL_TABLET | ORAL | 0 refills | Status: DC

## 2020-11-25 MED ORDER — AMOXICILLIN-POT CLAVULANATE 875-125 MG PO TABS: 1.0000 | ORAL_TABLET | Freq: Two times a day (BID) | ORAL | 0 refills | Status: AC

## 2020-11-25 MED ORDER — PANTOPRAZOLE SODIUM 40 MG PO TBEC: 40.0000 mg | DELAYED_RELEASE_TABLET | Freq: Every day | ORAL | 1 refills | Status: DC

## 2020-11-25 MED ORDER — HYDROCODONE-HOMATROPINE 5-1.5 MG/5ML PO SYRP: 5.0000 mL | ORAL_SOLUTION | Freq: Four times a day (QID) | ORAL | 0 refills | Status: DC | PRN

## 2020-11-25 NOTE — Telephone Encounter (Signed)
Called and spoke with patient, provided information per Dr. Sherene Sires.  Nothing further needed.

## 2020-11-25 NOTE — Telephone Encounter (Signed)
Called and spoke with Toni Amend at [facility] in [place], she stated that they cannot get the hycodan cough syrup.  The only cough medication they have with hydrocodone in it is Tussinex.  Dr. Sherene Sires, please advise if this is what you want to prescribe the patient.  Thank you.

## 2020-11-25 NOTE — Progress Notes (Deleted)
Beverly Tran, female    DOB: 08-05-1971,     MRN: [id number]   Brief patient profile:  50 yowf quit smoking 01/2020  "born with bronchitis"  (mother smoked)  On "breathing pill they quit making" then around age 50 changed to  inhalers as needed  and ever since and did fine s maint rx   until April 2021 with onset of cough refractory to all rx including  x for hydocodone so referred to pulmonary clinic 04/14/2020 by Dr   Sherryll Burger      History of Present Illness  04/14/2020  Pulmonary/ 1st office eval/Adoria Kawamoto  S/p 2nd of moderna 04/02/20  Chief Complaint  Patient presents with  . Pulmonary Consult    Referred by Dr Sherryll Burger. Pt c/o cough x 2 months. Cough is non prod and worse at night. She has found the only thing that has helped is hydrocodone cough syrup.   Dyspnea:  Only problem is heat - does fine in a/c including housework  Cough:worse at hs / dry sometimes choking  Sleep: bed is flat 2 pillows  SABA use: not helping cough  maint now on singulair / not using symbicort// already on gabapentin 300 tid and lyrica rec Valsartan 160-12.5 one daily instead of lotensin Add pepcid 20 mg one after supper  until return  GERD diet/ bedblocks  For cough > hydrocodone up to a tsp every 4 hours as needed but should be better w/in 2 weeks Please schedule a follow up office visit in 6 weeks, call sooner if needed with all medications /inhalers/ solutions in hand so we can verify exactly what you are taking. This includes all medications from all doctors and over the counters    11/25/2020  f/u ov/[place] office/Raymar Joiner re:  No chief complaint on file.    Dyspnea:  *** Cough: *** Sleeping: *** SABA use: *** 02: *** Covid status: *** Lung cancer screening: ***   No obvious day to day or daytime variability or assoc excess/ purulent sputum or mucus plugs or hemoptysis or cp or chest tightness, subjective wheeze or overt sinus or hb symptoms.   *** without nocturnal  or early am exacerbation  of  respiratory  c/o's or need for noct saba. Also denies any obvious fluctuation of symptoms with weather or environmental changes or other aggravating or alleviating factors except as outlined above   No unusual exposure hx or h/o childhood pna/ asthma or knowledge of premature birth.  Current Allergies, Complete Past Medical History, Past Surgical History, Family History, and Social History were reviewed in [employer] [facility] record.  ROS  The following are not active complaints unless bolded Hoarseness, sore throat, dysphagia, dental problems, itching, sneezing,  nasal congestion or discharge of excess mucus or purulent secretions, ear ache,   fever, chills, sweats, unintended wt loss or wt gain, classically pleuritic or exertional cp,  orthopnea pnd or arm/hand swelling  or leg swelling, presyncope, palpitations, abdominal pain, anorexia, nausea, vomiting, diarrhea  or change in bowel habits or change in bladder habits, change in stools or change in urine, dysuria, hematuria,  rash, arthralgias, visual complaints, headache, numbness, weakness or ataxia or problems with walking or coordination,  change in mood or  memory.        No outpatient medications have been marked as taking for the 11/25/20 encounter (Appointment) with Nyoka Cowden, MD.                    Past  Medical History:  Diagnosis Date  . Anxiety   . Asthma   . Back pain, chronic   . Depression   . High cholesterol   . Hypertension   . Migraine          Objective:       Wt Readings from Last 3 Encounters:  04/14/20 210 lb (95.3 kg)  10/12/16 190 lb (86.2 kg)  07/22/15 192 lb (87.1 kg)      Vital signs reviewed  11/25/2020  - Note at rest 02 sats  ***% on ***   General appearance:    ***      Chest ct due 04/27/20      Assessment

## 2020-11-25 NOTE — Progress Notes (Unsigned)
Beverly Tran, female    DOB: 09-Mar-1971,     MRN: [id number]   Brief patient profile:  49 yowf quit smoking 01/2020  "born with bronchitis"  (mother smoked)  On "breathing pill they quit making" then around age 50 changed to  inhalers as needed  and ever since and did fine s maint rx   until April 2021 with onset of cough refractory to all rx including  x for hydocodone so referred to pulmonary clinic 04/14/2020 by Dr   Sherryll Burger      History of Present Illness  04/14/2020  Pulmonary/ 1st office eval/Beverly Tran  S/p 2nd of moderna 04/02/20  Chief Complaint  Patient presents with  . Pulmonary Consult    Referred by Dr Sherryll Burger. Pt c/o cough x 2 months. Cough is non prod and worse at night. She has found the only thing that has helped is hydrocodone cough syrup.   Dyspnea:  Only problem is heat - does fine in a/c including housework  Cough:worse at hs / dry sometimes choking  Sleep: bed is flat 2 pillows  SABA use: not helping cough  maint now on singulair / not using symbicort// already on gabapentin 300 tid and lyrica rec Valsartan 160-12.5 one daily instead of lotensin Add pepcid 20 mg one after supper  until return  GERD diet/ bedblocks For cough > hydrocodone up to a tsp every 4 hours as needed but should be better w/in 2 weeks Please schedule a follow up office visit in 6 weeks, call sooner if needed with all medications /inhalers/ solutions in hand so we can verify exactly what you are taking. This includes all medications from all doctors and over the counters    11/25/2020  f/u ov/[place] office/Beverly Tran re:  Def doing much  better p last ov until 1st of 2022 eval by Sherryll Burger NP with nasal congestion / clear mucus / did not bring meds as requested Chief Complaint  Patient presents with  . Follow-up    Nasal congestion, non productive cough  Dyspnea: was doing fine until onset flare 1s of the year  Cough: worse when puts head down/ noct cough and in am  Sleeping: bed is flat/ 2 pillows  SABA  use: hfa not helping/ neb helps  more  02: 2lpm hs and  Prn daytime Covid status:2nd pfizer 09/08/20  Lung cancer screening: does not meet age criteria   No obvious day to day or daytime variability or assoc excess/ purulent sputum or mucus plugs or hemoptysis or cp or chest tightness, subjective wheeze or overt  hb symptoms.   Sleeping  without nocturnal  or early am exacerbation  of respiratory  c/o's or need for noct saba. Also denies any obvious fluctuation of symptoms with weather or environmental changes or other aggravating or alleviating factors except as outlined above   No unusual exposure hx or h/o childhood pna/ asthma or knowledge of premature birth.  Current Allergies, Complete Past Medical History, Past Surgical History, Family History, and Social History were reviewed in [employer] record.  ROS  The following are not active complaints unless bolded Hoarseness, sore throat, dysphagia, dental problems, itching, sneezing,  nasal congestion or discharge of excess mucus or purulent secretions, ear ache,   fever, chills, sweats, unintended wt loss or wt gain, classically pleuritic or exertional cp,  orthopnea pnd or arm/hand swelling  or leg swelling, presyncope, palpitations, abdominal pain, anorexia, nausea, vomiting, diarrhea  or change in bowel habits or change in  bladder habits, change in stools or change in urine, dysuria, hematuria,  rash, arthralgias, visual complaints, headache, numbness, weakness or ataxia or problems with walking or coordination,  change in mood or  memory.        Current Meds  Medication Sig  . albuterol (PROVENTIL HFA;VENTOLIN HFA) 108 (90 BASE) MCG/ACT inhaler Inhale 2 puffs into the lungs every 6 (six) hours as needed for wheezing or shortness of breath.   . baclofen (LIORESAL) 10 MG tablet Take 1 tablet by mouth at bedtime.  . famotidine (PEPCID) 20 MG tablet One after supper  . ipratropium-albuterol (DUONEB) 0.5-2.5 (3) MG/3ML  SOLN 1 vial in neb every 4 hours as needed  . levocetirizine (XYZAL) 5 MG tablet Take 1 tablet by mouth daily.  Marland Kitchen LORazepam (ATIVAN) 1 MG tablet Take 1 tablet (1 mg total) by mouth 2 (two) times daily as needed for anxiety.  . metFORMIN (GLUCOPHAGE) 500 MG tablet 2 in the am and 1 in the pm  . montelukast (SINGULAIR) 10 MG tablet Take 1 tablet by mouth daily.  . pramipexole (MIRAPEX) 0.125 MG tablet Take 1 tablet by mouth daily.  . pregabalin (LYRICA) 75 MG capsule Take 1 capsule by mouth in the morning, at noon, and at bedtime.  . propranolol (INDERAL) 10 MG tablet 1 in the am and 2 in the pm  . rosuvastatin (CRESTOR) 5 MG tablet Take 1 tablet by mouth daily.  . valsartan-hydrochlorothiazide (DIOVAN HCT) 160-12.5 MG tablet Take 1 tablet by mouth daily.                    Past Medical History:  Diagnosis Date  . Anxiety   . Asthma   . Back pain, chronic   . Depression   . High cholesterol   . Hypertension   . Migraine          Objective:       Wt Readings from Last 3 Encounters:  11/25/20 213 lb 6.4 oz (96.8 kg)  04/14/20 210 lb (95.3 kg)  10/12/16 190 lb (86.2 kg)      Vital signs reviewed  11/25/2020  - Note at rest 02 sats  92% on RA   General appearance:  amb wf nad   HEENT : pt wearing mask not removed for exam due to covid - 19 concerns.   NECK :  without JVD/Nodes/TM/ nl carotid upstrokes bilaterally   LUNGS: no acc muscle use,  Min barrel  contour chest wall with bilateral  slightly decreased bs s audible wheeze and  without cough on insp or exp maneuvers and min  Hyperresonant  to  percussion bilaterally     CV:  RRR  no s3 or murmur or increase in P2, and no edema   ABD: obese soft and nontender with pos end  insp Hoover's  in the supine position. No bruits or organomegaly appreciated, bowel sounds nl  MS:   Nl gait/  ext warm without deformities, calf tenderness, cyanosis or clubbing No obvious joint restrictions   SKIN: warm and dry without lesions     NEURO:  alert, approp, nl sensorium with  no motor or cerebellar deficits apparent.        Labs ordered 11/25/2020  :  allergy profile   alpha one AT phenotype          Assessment

## 2020-11-25 NOTE — Patient Instructions (Addendum)
Discuss stopping the inderal with your neurologist   Pantoprazole (protonix) 40 mg   Take  30-60 min before first meal of the day and Pepcid (famotidine)  20 mg one after supper until return to office - this is the best way to tell whether stomach acid is contributing to your problem.    GERD (REFLUX)  is an extremely common cause of respiratory symptoms just like yours , many times with no obvious heartburn at all.    It can be treated with medication, but also with lifestyle changes including elevation of the head of your bed (ideally with 6 -8inch blocks under the headboard of your bed),  Smoking cessation, avoidance of late meals, excessive alcohol, and avoid fatty foods, chocolate, peppermint, colas, red wine, and acidic juices such as orange juice.  NO MINT OR MENTHOL PRODUCTS SO NO COUGH DROPS  USE SUGARLESS CANDY INSTEAD (Jolley ranchers or Stover's or Life Savers) or even ice chips will also do - the key is to swallow to prevent all throat clearing. NO OIL BASED VITAMINS - use powdered substitutes.  Avoid fish oil when coughing.  Augmentin 875 mg take one pill twice daily  X 10 days - take at breakfast and supper with large glass of water.  It would help reduce the usual side effects (diarrhea and yeast infections) if you ate cultured yogurt at lunch.   Prednisone 10 mg take  4 each am x 2 days,   2 each am x 2 days,  1 each am x 2 days and stop   For cough > hydrocodone up to a tsp every 4 hours as needed but should be better w/in 2 weeks   Please remember to go to the lab department @ [facility] for your tests - we will call you with the results when they are available.       Please schedule a follow up office visit in 6 weeks, call sooner if needed with pfts on return

## 2020-11-25 NOTE — Telephone Encounter (Signed)
Yes, I will write for it but the computer is locking up so I'll try again at 5pm   Tell her she can take it up to every 6 hours (more freq) for 3 days just to get out in front of the cough then d/c

## 2020-11-26 ENCOUNTER — Encounter: Payer: Self-pay | Admitting: Internal Medicine

## 2020-11-26 MED ORDER — HYDROCOD POLST-CPM POLST ER 10-8 MG/5ML PO SUER: 5.0000 mL | Freq: Two times a day (BID) | ORAL | 0 refills | Status: DC | PRN

## 2020-11-26 MED ORDER — HYDROCODONE-HOMATROPINE 5-1.5 MG/5ML PO SYRP: 5.0000 mL | ORAL_SOLUTION | Freq: Four times a day (QID) | ORAL | 0 refills | Status: DC | PRN

## 2020-11-26 NOTE — Addendum Note (Signed)
Addended by: Kerin Ransom on: 11/26/2020 11:45 AM   Modules accepted: Orders

## 2020-11-26 NOTE — Telephone Encounter (Signed)
done

## 2020-11-26 NOTE — Assessment & Plan Note (Signed)
Onset was April 2021  - try off acei 04/14/2020 and pepcid 20 mg after supper - flare Oct 17 2020 assoc rhinitis? Sinusitis > rec augmentin/pred and cyclical cough rx  -Labs ordered 11/25/2020  :  allergy profile   alpha one AT phenotype    Upper airway cough syndrome (previously labeled PNDS),  is so named because it's frequently impossible to sort out how much is  CR/sinusitis with freq throat clearing (which can be related to primary GERD)   vs  causing  secondary (" extra esophageal")  GERD from wide swings in gastric pressure that occur with throat clearing, often  promoting self use of mint and menthol lozenges that reduce the lower esophageal sphincter tone and exacerbate the problem further in a cyclical fashion.   These are the same pts (now being labeled as having "irritable larynx syndrome" by some cough centers) who not infrequently have a history of having failed to tolerate ace inhibitors,  dry powder inhalers or biphosphonates or report having atypical/extraesophageal reflux symptoms that don't respond to standard doses of PPI  and are easily confused as having aecopd or asthma flares by even experienced allergists/ pulmonologists (myself included).   Of the three most common causes of  Sub-acute / recurrent or chronic cough, only one (GERD)  can actually contribute to/ trigger  the other two (asthma and post nasal drip syndrome)  and perpetuate the cylce of cough.  While not intuitively obvious, many patients with chronic low grade reflux do not cough until there is a primary insult that disturbs the protective epithelial barrier and exposes sensitive nerve endings.   This is typically viral but can due to PNDS and  either may apply here.   The point is that once this occurs, it is difficult to eliminate the cycle  using anything but a maximally effective acid suppression regimen at least in the short run, accompanied by an appropriate diet to address non acid GERD and control / eliminate the  cough itself for at least 3 days with tussionex  >>> also so added 6 day taper off  Prednisone starting at 40 mg per day in case of component of Th-2 driven upper or lower airways inflammation (if cough responds short term only to relapse before return while will on full rx for uacs (as above), then  that would point to allergic rhinitis/ asthma or eos bronchitis as alternative dx)   >>> f/u 6 weeks with pfts          Each maintenance medication was reviewed in detail including emphasizing most importantly the difference between maintenance and prns and under what circumstances the prns are to be triggered using an action plan format where appropriate.  Total time for H and P, chart review, counseling, reviewing hfa device(s) and generating customized AVS unique to this office visit / same day charting = 20 min

## 2020-11-26 NOTE — Telephone Encounter (Signed)
Called and spoke to pt. Rx has been sent to pharmacy; rx sent to [facility] pharmacy in [place]. Pt okay with this being sent to [facility]. Pt aware to call back if there are any issues. Nothing further needed at this time.

## 2020-11-26 NOTE — Addendum Note (Signed)
Addended by: Sandrea Hughs B on: 11/26/2020 12:01 PM   Modules accepted: Orders

## 2020-11-26 NOTE — Telephone Encounter (Signed)
Call made to pharmacy, confirmed with [facility] script has been received.   Call made to patient to make aware. Voiced understanding.   Nothing further needed at this time.

## 2020-11-26 NOTE — Telephone Encounter (Signed)
Call made to pharmacy, hycodan is currently on back order. Per pharmacists this is across the board within in [place].   Called [facility]. They do have the hycodan in stock. [phone].   Call made to patient, made aware [facility] does not have the hycodan in stock. Patient inquired as to why we did not know that. I explained that we do not call the pharmacy before sending in every medication to see if if was in stock. Voiced understanding.   Ok to send to [facility] pharmacy per patient.   Will route message back to provider.

## 2020-11-26 NOTE — Telephone Encounter (Signed)
Done, went thru that time I think

## 2020-11-26 NOTE — Telephone Encounter (Signed)
Pt. States the pharmacy has not received the updated RX at [facility] in [place]. Please advise [phone]

## 2020-11-26 NOTE — Telephone Encounter (Signed)
Will route message to MD to see if he can send when he has a chance as there is a note stating he was having trouble with his computer.   Call made to [facility] at [facility] to make aware I am sending message to MD. Voiced understanding.

## 2020-11-26 NOTE — Telephone Encounter (Signed)
Pt called back [facility] is saying they haven't received the RX. Pt requests a call to her Please advise [phone]

## 2020-12-09 ENCOUNTER — Ambulatory Visit (INDEPENDENT_AMBULATORY_CARE_PROVIDER_SITE_OTHER): Payer: 59 | Admitting: Gastroenterology

## 2020-12-09 ENCOUNTER — Encounter: Payer: Self-pay | Admitting: Gastroenterology

## 2020-12-09 ENCOUNTER — Telehealth: Payer: Self-pay | Admitting: *Deleted

## 2020-12-09 ENCOUNTER — Other Ambulatory Visit: Payer: Self-pay

## 2020-12-09 VITALS — BP 125/80 | HR 102 | Temp 97.5°F | Ht 63.0 in | Wt 220.0 lb

## 2020-12-09 DIAGNOSIS — R1310 Dysphagia, unspecified: Secondary | ICD-10-CM

## 2020-12-09 DIAGNOSIS — K529 Noninfective gastroenteritis and colitis, unspecified: Secondary | ICD-10-CM | POA: Diagnosis not present

## 2020-12-09 MED ORDER — RIFAXIMIN 550 MG PO TABS: 550.0000 mg | ORAL_TABLET | Freq: Three times a day (TID) | ORAL | 0 refills | Status: AC

## 2020-12-09 NOTE — Progress Notes (Signed)
Primary Care Physician:  Kirstie Peri, MD  Referring Physician: Dr. Sherryll Burger  Primary Gastroenterologist:  Dr. Jena Gauss  Chief Complaint  Patient presents with  . Irritable Bowel Syndrome    diarrhea  . Abdominal Cramping    HPI:   Beverly Tran is a 50 y.o. female presenting today at the request of Dr. Sherryll Burger due to abdominal pain and diarrhea. EGD and colonoscopy both done in 2003 with normal esophagus, small bowel biopsy but path not available, poor colonic prep, grossly normal rectum, colon, and TI. Path not available.    Was told she had IBS back in 2003. Has constant need to go to bathroom. Feels like she will have incontinent episodes at times. Associated abdominal cramps lower abdomen, doubled up in bed for 30 minutes at a time. Abdominal pain has worsened over past year. BMs can be anywhere from 4-6 times per day. Alternating between watery and soft/milkshake consistency. Some improvement with BMs. No rectal bleeding. No weight loss or lack of appetite. Bentyl without improvement. Started metformin about 7-8 months ago. Abdominal cramping prior to metformin.   GERD: just started Protonix by Pulmonology due to chronic cough. Sometimes feels food getting stuck in esophagus and has to swallow. Varies with type of foods.   FH: no family history of colon cancer or colon polyps.   Past Medical History:  Diagnosis Date  . Anxiety   . Asthma   . Back pain, chronic   . Depression   . GERD (gastroesophageal reflux disease)   . High cholesterol   . Hypertension   . IBS (irritable bowel syndrome)   . Migraine     Past Surgical History:  Procedure Laterality Date  . APPENDECTOMY    . CESAREAN SECTION    . CHOLECYSTECTOMY    . COLONOSCOPY  2003   poor prep, grossly normal rectum, colon, and TI. Path not available.   . ESOPHAGOGASTRODUODENOSCOPY  2003   Dr. Jena Gauss:  normal esophagus, stomach, duodenum s/p small bowel biopsy.   Marland Kitchen HERNIA REPAIR    . TUBAL LIGATION       Current Outpatient Medications  Medication Sig Dispense Refill  . albuterol (PROVENTIL HFA;VENTOLIN HFA) 108 (90 BASE) MCG/ACT inhaler Inhale 2 puffs into the lungs every 6 (six) hours as needed for wheezing or shortness of breath.     . baclofen (LIORESAL) 10 MG tablet Take 1 tablet by mouth at bedtime.    . benazepril-hydrochlorthiazide (LOTENSIN HCT) 10-12.5 MG tablet Take 1 tablet by mouth daily.    . chlorpheniramine-HYDROcodone (TUSSIONEX PENNKINETIC ER) 10-8 MG/5ML SUER Take 5 mLs by mouth every 12 (twelve) hours as needed for cough. 140 mL 0  . famotidine (PEPCID) 20 MG tablet One after supper 30 tablet 11  . HYDROcodone-acetaminophen (NORCO) 10-325 MG tablet Take 1 tablet by mouth 2 (two) times daily as needed.    Marland Kitchen HYDROcodone-homatropine (HYCODAN) 5-1.5 MG/5ML syrup Take 5 mLs by mouth every 6 (six) hours as needed for cough. 240 mL 0  . HYDROcodone-homatropine (HYCODAN) 5-1.5 MG/5ML syrup Take 5 mLs by mouth every 6 (six) hours as needed for cough. 240 mL 0  . ipratropium-albuterol (DUONEB) 0.5-2.5 (3) MG/3ML SOLN 1 vial in neb every 4 hours as needed    . levocetirizine (XYZAL) 5 MG tablet Take 1 tablet by mouth daily.    . metFORMIN (GLUCOPHAGE) 500 MG tablet 2 in the am and 1 in the pm    . montelukast (SINGULAIR) 10 MG tablet  Take 1 tablet by mouth daily.    . pantoprazole (PROTONIX) 40 MG tablet Take 1 tablet (40 mg total) by mouth daily. 30 tablet 1  . pramipexole (MIRAPEX) 0.125 MG tablet Take 1 tablet by mouth daily.    . pregabalin (LYRICA) 300 MG capsule Take 300 mg by mouth 2 (two) times daily.    . propranolol (INDERAL) 10 MG tablet Take 10 mg by mouth 2 (two) times daily.    . rifaximin (XIFAXAN) 550 MG TABS tablet Take 1 tablet (550 mg total) by mouth 3 (three) times daily for 14 days. 42 tablet 0  . rosuvastatin (CRESTOR) 5 MG tablet Take 1 tablet by mouth daily.     No current facility-administered medications for this visit.    Allergies as of 12/09/2020 -  Review Complete 12/09/2020  Allergen Reaction Noted  . Diclofenac Anaphylaxis 11/25/2020  . Salami [pickled meat] Shortness Of Breath and Swelling 04/19/2013  . Aspirin Other (See Comments) 03/02/2012  . Bee venom Swelling 04/19/2013  . Compazine [prochlorperazine] Hives 03/02/2012  . Imitrex [sumatriptan] Hives 03/02/2012  . Nubain [nalbuphine hcl] Swelling and Other (See Comments) 03/02/2012  . Thorazine [chlorpromazine] Hives 03/02/2012  . Voltaren [diclofenac sodium] Swelling 03/02/2012  . Zofran [ondansetron hcl] Nausea Only 05/04/2013    Family History  Problem Relation Age of Onset  . Seizures Mother   . Heart failure Mother   . Heart failure Father   . Colon cancer Neg Hx   . Colon polyps Neg Hx     Social History   Socioeconomic History  . Marital status: Divorced    Spouse name: Not on file  . Number of children: Not on file  . Years of education: Not on file  . Highest education level: Not on file  Occupational History  . Not on file  Tobacco Use  . Smoking status: Current Some Day Smoker    Packs/day: 1.00    Years: 7.00    Pack years: 7.00    Types: Cigarettes    Last attempt to quit: 02/03/2020    Years since quitting: 0.8  . Smokeless tobacco: Never Used  Substance and Sexual Activity  . Alcohol use: No  . Drug use: No  . Sexual activity: Yes    Birth control/protection: Surgical  Other Topics Concern  . Not on file  Social History Narrative  . Not on file   Social Determinants of Health   Financial Resource Strain: Not on file  Food Insecurity: Not on file  Transportation Needs: Not on file  Physical Activity: Not on file  Stress: Not on file  Social Connections: Not on file  Intimate Partner Violence: Not on file    Review of Systems: Gen: Denies any fever, chills, fatigue, weight loss, lack of appetite.  CV: Denies chest pain, heart palpitations, peripheral edema, syncope.  Resp: Denies shortness of breath at rest or with exertion.  Denies wheezing or cough.  GI: see HPI GU : Denies urinary burning, urinary frequency, urinary hesitancy MS: +back pain, leg pain Derm: Denies rash, itching, dry skin Psych: Denies depression, anxiety, memory loss, and confusion Heme: Denies bruising, bleeding, and enlarged lymph nodes.  Physical Exam: BP 125/80   Pulse (!) 102   Temp (!) 97.5 F (36.4 C) (Temporal)   Ht 5\' 3"  (1.6 m)   Wt 220 lb (99.8 kg)   BMI 38.97 kg/m  General:   Alert and oriented. Pleasant and cooperative. Well-nourished and well-developed.  Head:  Normocephalic and atraumatic.  Eyes:  Without icterus, sclera clear and conjunctiva pink.  Ears:  Normal auditory acuity. Mouth: mask in place Lungs:  Clear to auscultation bilaterally. No wheezes, rales, or rhonchi. No distress.  Heart:  S1, S2 present without murmurs appreciated.  Abdomen:  +BS, soft, non-tender and non-distended. No HSM noted. No guarding or rebound. No masses appreciated.  Rectal:  Deferred  Msk:  Symmetrical without gross deformities. Normal posture. Extremities:  Without edema. Neurologic:  Alert and  oriented x4;  grossly normal neurologically. Skin:  Intact without significant lesions or rashes. Psych:  Alert and cooperative. Normal mood and affect.  ASSESSMENT: Beverly Tran is a 50 y.o. female presenting today with long-standing history of what was felt to be IBS-D, presenting at request of Dr. Sherryll Burger.   Diarrhea is chronic, without any significant change lately, so we will hold off on stool studies. Bentyl without improvement. Although she is on metformin, she has had chronic issues prior to starting this. Post-cholecystectomy state could be contributing as well. Highly likely this is IBS-D that needs more aggressive regimen. Will check celiac serologies for completeness' sake and trial course of Xifaxan X 14 days. Colonoscopy to be undertaken in the near future for screening purposes. May benefit from random colonic biopsies.    Dysphagia: noted with solids. Recently started Protonix by Pulmonology due to chronic GERD. Will pursue EGD/dilation at time of colonoscopy.    PLAN: Proceed with colonoscopy/EGD/dilation with random colonic biopsies by Dr. Jena Gauss in near future with Propofol: the risks, benefits, and alternatives have been discussed with the patient in detail. The patient states understanding and desires to proceed.  Check celiac serologies  Xifaxan 550 mg TID for 14 days  Return in 3-4 months   Gelene Mink, PhD, [facility]

## 2020-12-09 NOTE — Telephone Encounter (Signed)
Patient wanted to be called to schedule procedure.  Called pt, LMOVM to call back to schedule TCS/EGD/DIL with propofol, Dr. Jena Gauss, ASA 3

## 2020-12-09 NOTE — Telephone Encounter (Signed)
Patient returned call. She has been scheduled for 4/25 am appt. Aware will mail prep instructions with pre-op/covid test appt.

## 2020-12-09 NOTE — Patient Instructions (Signed)
We are arranging a colonoscopy, upper endoscopy, and dilation in the near future with Dr. Jena Gauss!  Please do not take metformin the day of the procedure.  For diarrhea, I have sent in a medication called Xifaxan. Take this three times per day for 14 days only. The insurance may require a prior authorization, so let us know if any trouble in getting this.  Please complete blood work testing for celiac disease.  We will see you back in 3-4 months!  It was a pleasure to see you today. I want to create trusting relationships with patients to provide genuine, compassionate, and quality care. I value your feedback. If you receive a survey regarding your visit,  I greatly appreciate you taking time to fill this out.   Gelene Mink, PhD, ANP-BC [facility] Gastroenterology

## 2020-12-13 ENCOUNTER — Encounter: Payer: Self-pay | Admitting: Gastroenterology

## 2020-12-18 ENCOUNTER — Telehealth: Payer: Self-pay

## 2020-12-18 NOTE — Telephone Encounter (Signed)
Pt appeal approval for Xifaxan 550mg  tab. Effective for a maximum of 1 fills from 12/18/2020 to 03/12/2021. Will notify the pt as well as her pharmacy of this approval.

## 2020-12-18 NOTE — Telephone Encounter (Signed)
FYI:    After going through an appeal to get this pt's Xifaxan 550mg . Tab. I phoned to her pharmacy only to find out the pt had them to transfer the Rx and it wasn't in the notes where to. I phoned the pt and was advised by her that her PCP gave her samples of Xifaxan 550mg  for 2 weeks and she stated she's been taking it already. I advised the pt to call the pharmacy where she had it at and transferred to so they would be aware o f this. This was worked on since feb 23rd.

## 2020-12-22 ENCOUNTER — Telehealth: Payer: Self-pay | Admitting: Internal Medicine

## 2020-12-22 ENCOUNTER — Ambulatory Visit (HOSPITAL_COMMUNITY): Payer: 59 | Attending: Neurology | Admitting: Physical Therapy

## 2020-12-22 ENCOUNTER — Encounter (HOSPITAL_COMMUNITY): Payer: Self-pay | Admitting: Physical Therapy

## 2020-12-22 ENCOUNTER — Other Ambulatory Visit: Payer: Self-pay

## 2020-12-22 DIAGNOSIS — M6281 Muscle weakness (generalized): Secondary | ICD-10-CM | POA: Diagnosis present

## 2020-12-22 DIAGNOSIS — R29898 Other symptoms and signs involving the musculoskeletal system: Secondary | ICD-10-CM | POA: Insufficient documentation

## 2020-12-22 DIAGNOSIS — M545 Low back pain, unspecified: Secondary | ICD-10-CM | POA: Diagnosis not present

## 2020-12-22 DIAGNOSIS — R2689 Other abnormalities of gait and mobility: Secondary | ICD-10-CM | POA: Insufficient documentation

## 2020-12-22 NOTE — Patient Instructions (Signed)
Access Code: [email or web address] URL: [email or web address] Date: 12/22/2020 Prepared by: Greig Castilla Yareni Creps  Exercises Supine Bridge - 1 x daily - 7 x weekly - 1-2 sets - 10 reps Hook Lying Single Knee to Chest Stretch with Towel - 1 x daily - 7 x weekly - 5 reps - 10 seconds hold

## 2020-12-22 NOTE — Telephone Encounter (Signed)
She did not go to lab as requested p last ov   Suggest she do so asap and set up f/u ov with me asap with all meds in hand to regroup as narcotics can't be used repeatedly or chronically to control cough

## 2020-12-22 NOTE — Therapy (Signed)
[facility] [address] [place] [address], [place], [address] Phone: [phone]   Fax:  [phone]  Physical Therapy Evaluation  Patient Details  Name: Beverly Tran MRN: [id number] Date of Birth: 02/16/1971 Referring Provider (PT): Zetta Bills NP   Encounter Date: 12/22/2020   PT End of Session - 12/22/20 1036    Visit Number 1    Number of Visits 12    Date for PT Re-Evaluation 02/02/21    Authorization Type [employer] (30 vl, no auth)    Authorization - Visit Number 1    Authorization - Number of Visits 30    Progress Note Due on Visit 10    PT Start Time 1027    PT Stop Time 1113    PT Time Calculation (min) 46 min    Activity Tolerance Patient tolerated treatment well    Behavior During Therapy [facility] for tasks assessed/performed           Past Medical History:  Diagnosis Date  . Anxiety   . Asthma   . Back pain, chronic   . Depression   . GERD (gastroesophageal reflux disease)   . High cholesterol   . Hypertension   . IBS (irritable bowel syndrome)   . Migraine     Past Surgical History:  Procedure Laterality Date  . APPENDECTOMY    . CESAREAN SECTION    . CHOLECYSTECTOMY    . COLONOSCOPY  2003   poor prep, grossly normal rectum, colon, and TI. Path not available.   . ESOPHAGOGASTRODUODENOSCOPY  2003   Dr. Jena Gauss:  normal esophagus, stomach, duodenum s/p small bowel biopsy.   Marland Kitchen HERNIA REPAIR    . TUBAL LIGATION      There were no vitals filed for this visit.    Subjective Assessment - 12/22/20 1023    Subjective Patient is a 50 y.o. female who presents to physical therapy with c/o lumbar radiculopathy. She states she has always had back troubles. When she had her son in 1989 she had several extra shots in her back. She is currently also having pain in her right hip and down her leg to her ankle. Patient has chronic back pain. Her radicular symptoms began in January 2022. Pain is worse with standing, walking,  chores, stretching. Symptoms are constant but achy and dull with sitting. 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 Her pain began with insidious onset.    Pertinent History chronic back pain    Limitations Lifting;Standing;Walking;House hold activities    How long can you walk comfortably? less than 5 minutes    Patient Stated Goals help with pain    Currently in Pain? Yes    Pain Score 7     Pain Location Leg    Pain Orientation Right    Pain Descriptors / Indicators Dull;Sharp    Pain Type Chronic pain;Acute pain    Pain Onset More than a month ago    Pain Frequency Constant              [facility] PT Assessment - 12/22/20 0001      Assessment   Medical Diagnosis Lumbar Radiculopathy    Referring Provider (PT) Zetta Bills NP    Onset Date/Surgical Date 10/26/20    Next MD Visit 01/05/21    Prior Therapy none      Precautions   Precautions None      Restrictions   Weight Bearing Restrictions No  Balance Screen   Has the patient fallen in the past 6 months No    Has the patient had a decrease in activity level because of a fear of falling?  No    Is the patient reluctant to leave their home because of a fear of falling?  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, currently out of work due to injury      Cognition   Overall Cognitive Status Within Functional Limits for tasks assessed      Observation/Other Assessments   Observations Ambulates with SPC    Focus on Therapeutic Outcomes (FOTO)  44% function      Sensation   Light Touch Appears Intact    Additional Comments states neuropathy      ROM / Strength   AROM / PROM / Strength AROM;Strength      AROM   AROM Assessment Site Lumbar    Lumbar Flexion 25% limited, pain in low back/ R hip    Lumbar Extension 50% limited, pain in low back/ R hip    Lumbar - Right Side Bend 25% limited, pain in low back/ R hip     Lumbar - Left Side Bend 0% limited    Lumbar - Right Rotation 0% limited    Lumbar - Left Rotation 0% limited      Strength   Strength Assessment Site Hip;Knee;Ankle    Right/Left Hip Right;Left    Right Hip Flexion 4/5    Right Hip Extension 3-/5    Left Hip Flexion 4+/5    Left Hip Extension 3-/5    Right/Left Knee Right;Left    Right Knee Flexion 4+/5    Right Knee Extension 4/5    Left Knee Flexion 5/5    Left Knee Extension 5/5    Right/Left Ankle Right;Left    Right Ankle Dorsiflexion 5/5    Left Ankle Dorsiflexion 5/5      Palpation   Palpation comment TTP lower lumbar paraspinals, bilateral QL, R glutes      Transfers   Five time sit to stand comments  11.87 seconds without UE use, painful in R hip, relies on LLE slightly more than R      Ambulation/Gait   Ambulation/Gait Yes    Ambulation/Gait Assistance 7: Independent    Ambulation Distance (Feet) 350 Feet    Assistive device None    Gait Pattern Decreased stride length    Ambulation Surface Level;Indoor    Gait velocity decreased    Gait Comments , pain increase in R posterior hip after 1 lap, no AD, decreased cadence                      Objective measurements completed on examination: See above findings.       [facility] Adult PT Treatment/Exercise - 12/22/20 0001      Exercises   Exercises Lumbar      Lumbar Exercises: Stretches   Single Knee to Chest Stretch 5 reps;10 seconds;Right;Left    Single Knee to Chest Stretch Limitations with towel      Lumbar Exercises: Supine   Bridge 10 reps                  PT Education - 12/22/20 1023    Education Details Patient educated on exam findings, POC, scope of PT  Person(s) Educated Patient    Methods Explanation;Demonstration    Comprehension Verbalized understanding;Returned demonstration            PT Short Term Goals - 12/22/20 1119      PT SHORT TERM GOAL #1   Title Patient will be independent with HEP in order to  improve functional outcomes.    Time 3    Period Weeks    Status New    Target Date 01/12/21      PT SHORT TERM GOAL #2   Title Patient will report at least 25% improvement in symptoms for improved quality of life.    Time 3    Period Weeks    Status New    Target Date 01/12/21             PT Long Term Goals - 12/22/20 1120      PT LONG TERM GOAL #1   Title Patient will report at least 75% improvement in symptoms for improved quality of life.    Time 6    Period Weeks    Status New    Target Date 02/02/21      PT LONG TERM GOAL #2   Title Patient will improve FOTO score by at least 12 points in order to indicate improved tolerance to activity.    Time 6    Period Weeks    Status New    Target Date 02/02/21      PT LONG TERM GOAL #3   Title Patient will demonstrate at least 25% improvement in lumbar ROM in all restricted planes for improved ability to move trunk while completing chores.    Time 6    Period Weeks    Status New    Target Date 02/02/21                  Plan - 12/22/20 1116    Clinical Impression Statement Patient is a 50 y.o. female who presents to physical therapy with c/o lumbar radiculopathy. She presents with pain limited deficits in lumbar and LE strength, ROM, endurance, postural impairments, spinal mobility, tender musculature, and functional mobility with ADL. She is having to modify and restrict ADL as indicated by FOTO score as well as subjective information and objective measures which is affecting overall participation. Patient will benefit from skilled physical therapy in order to improve function and reduce impairment.    Personal Factors and Comorbidities Fitness;Past/Current Experience;Behavior Pattern;Comorbidity 3+;Time since onset of injury/illness/exacerbation;Education    Comorbidities Arthritis, Asthma, Back pain, BMI over 30, Diabetes, High Blood Pressure    Examination-Activity Limitations Locomotion  Level;Transfers;Stand;Stairs;Squat;Lift;Bend    Examination-Participation Restrictions Church;Meal Prep;Cleaning;Occupation;Community Activity;Volunteer;Yard Work;Shop    Stability/Clinical Decision Making Stable/Uncomplicated    Clinical Decision Making Low    Rehab Potential Good    PT Frequency 2x / week    PT Duration 6 weeks    PT Treatment/Interventions ADLs/Self Care Home Management;Aquatic Therapy;Cryotherapy;Electrical Stimulation;Iontophoresis 4mg /ml Dexamethasone;Moist Heat;Traction;Ultrasound;DME Instruction;Gait training;Stair training;Functional mobility training;Therapeutic activities;Therapeutic exercise;Balance training;Neuromuscular re-education;Patient/family education;Manual techniques;Dry needling;Energy conservation;Spinal Manipulations;Joint Manipulations;Manual lymph drainage;Compression bandaging;Orthotic Fit/Training;Splinting;Taping    PT Next Visit Plan begin core and hip strengthening, lumbar and hip mobility, f/u with initial HEP    PT Home Exercise Plan 3/8 bridge, SKTC    Consulted and Agree with Plan of Care Patient           Patient will benefit from skilled therapeutic intervention in order to improve the following deficits and impairments:  Difficulty walking,Decreased range of motion,Decreased endurance,Increased  muscle spasms,Decreased activity tolerance,Pain,Decreased balance,Impaired flexibility,Improper body mechanics,Decreased mobility,Decreased strength  Visit Diagnosis: Low back pain, unspecified back pain laterality, unspecified chronicity, unspecified whether sciatica present  Muscle weakness (generalized)  Other abnormalities of gait and mobility  Other symptoms and signs involving the musculoskeletal system     Problem List Patient Active Problem List   Diagnosis Date Noted  . Chronic diarrhea 12/09/2020  . Dysphagia 12/09/2020  . Upper airway cough syndrome 04/14/2020  . Essential hypertension 04/14/2020    11:22 AM,  12/22/20 Wyman Songster PT, DPT Physical Therapist at [facility]   [facility] [address] [place], [place], [address] Phone: [phone]   Fax:  [phone]  Name: Beverly Tran MRN: [id number] Date of Birth: 01-Nov-1970

## 2020-12-22 NOTE — Telephone Encounter (Signed)
Called and spoke with patient who states that she is wanting a rx to be called in for [facility] states that her cough is getting worse. States that it is a dry cough. Denies fever, increased shortness of breath. Pharmacy; [facility] [place]  Dr. Sherene Sires please advise

## 2020-12-23 NOTE — Telephone Encounter (Signed)
Spoke with the pt and notified of response per Dr Sherene Sires. She verbalized understanding. She states will get the labs done at [facility]. She did not want to set up ov yet and will call back to schedule this after she completes her labs. Will close out encounter.

## 2020-12-24 ENCOUNTER — Encounter (HOSPITAL_COMMUNITY): Payer: 59 | Admitting: Physical Therapy

## 2020-12-30 ENCOUNTER — Telehealth: Payer: Self-pay | Admitting: Internal Medicine

## 2020-12-30 NOTE — Telephone Encounter (Signed)
Move her up to next available for me or NP  And if still nothing can add on as televisit but let her know can't be sure what time I can call her, just that I'll get to her by the end of the day

## 2020-12-30 NOTE — Telephone Encounter (Signed)
Called and spoke with patient, she is now scheduled for Friday 01/01/2021 with Dr Vassie Loll in [place] at 11:15am. Nothing further needed at this time.

## 2020-12-30 NOTE — Telephone Encounter (Signed)
Called and spoke with patient who states went to the hospital on 12/27/20 this past weekend and was diagnosed with pnuemonia and would like to know if there is else MW would like to do. Before patient went to ED she state that PCP gave her antibiotic and steroid pack. She finished Prednisone and has 2 Levaquin left. Patients chest feels tight and has a cough. Denies fever dry cough and is doing breathing treatments 4 times a day. Patient has an upcoming appointment on 3/24.  Dr. Sherene Sires please advise

## 2020-12-31 ENCOUNTER — Ambulatory Visit (HOSPITAL_COMMUNITY): Admission: RE | Admit: 2020-12-31 | Payer: MEDICAID | Source: Ambulatory Visit

## 2020-12-31 NOTE — Telephone Encounter (Signed)
Beverly Tran much better option

## 2020-12-31 NOTE — Telephone Encounter (Signed)
Appt has already been made. Confirmed with patient as well.   Nothing further needed at this time.

## 2020-12-31 NOTE — Telephone Encounter (Signed)
Dr. Sherene Sires are you ok with patient seeing Dr. Vassie Loll tomorrow in [place] or would you rather her be a Televisit on your schedule? Patient not willing to drive to [facility]  Please advise

## 2021-01-01 ENCOUNTER — Other Ambulatory Visit: Payer: Self-pay

## 2021-01-01 ENCOUNTER — Encounter: Payer: Self-pay | Admitting: Pulmonary Disease

## 2021-01-01 ENCOUNTER — Telehealth: Payer: Self-pay | Admitting: Pulmonary Disease

## 2021-01-01 ENCOUNTER — Ambulatory Visit (INDEPENDENT_AMBULATORY_CARE_PROVIDER_SITE_OTHER): Payer: 59 | Admitting: Pulmonary Disease

## 2021-01-01 VITALS — BP 132/88 | HR 90 | Temp 97.8°F | Ht 63.0 in | Wt 215.6 lb

## 2021-01-01 DIAGNOSIS — J209 Acute bronchitis, unspecified: Secondary | ICD-10-CM | POA: Diagnosis not present

## 2021-01-01 DIAGNOSIS — R058 Other specified cough: Secondary | ICD-10-CM | POA: Diagnosis not present

## 2021-01-01 MED ORDER — HYDROCODONE-HOMATROPINE 5-1.5 MG/5ML PO SYRP: 5.0000 mL | ORAL_SOLUTION | Freq: Three times a day (TID) | ORAL | 0 refills | Status: DC | PRN

## 2021-01-01 MED ORDER — OLMESARTAN MEDOXOMIL-HCTZ 20-12.5 MG PO TABS: 1.0000 | ORAL_TABLET | Freq: Every day | ORAL | 1 refills | Status: DC

## 2021-01-01 MED ORDER — PREDNISONE 10 MG PO TABS: ORAL_TABLET | ORAL | 0 refills | Status: DC

## 2021-01-01 NOTE — Addendum Note (Signed)
Addended by: Melonie Florida on: 01/01/2021 12:09 PM   Modules accepted: Orders

## 2021-01-01 NOTE — Telephone Encounter (Signed)
Called and spoke with the pharmacy and they are aware that it is ok to refill the hydrocodone cough meds.

## 2021-01-01 NOTE — Progress Notes (Signed)
Subjective:    Patient ID: Beverly Tran, female    DOB: 14-Jan-1971, 50 y.o.   MRN: [id number]  HPI 49 yowf quit smoking 01/2020  "born with bronchitis"  (mother smoked) with chronic cough followed by [facility]   Chief Complaint  Patient presents with  . Acute Visit    Chest still feels a little tight, non productive cough mainly but does have yellowish-clear phlegm at times    went toED [facility]  on 12/27/20 and was diagnosed with pnuemonia. Before patient went to ED she state that PCP gave her antibiotic and steroid pack. She finished Prednisone and has 2 Levaquin left. Patients chest feels tight and has a cough. Denies fever  and is doing breathing treatments 4 times a day. Reviewed [facility] records, chest x-ray is reported as right upper lobe linear atelectasis, no evidence of consolidation. Respiratory viral panel was negative She was given neb, cough syrup and discharged without any medications to take.  She reports that her cough is persistent and dry.  This is different from her routine chronic cough She reports intermittent wheezing. Reflux is controlled with Protonix  Medication review shows benazepril  Allergies  Allergen Reactions  . Diclofenac Anaphylaxis  . Neoma Laming Meat] Shortness Of Breath and Swelling    Also Allergic to POPCORN: same reaction  . Aspirin Other (See Comments)    Nose bleeds  . Bee Venom Swelling    WASP/HORNET  . Compazine [Prochlorperazine] Hives  . Imitrex [Sumatriptan] Hives  . Nubain [Nalbuphine Hcl] Swelling and Other (See Comments)    Eye swelling  . Thorazine [Chlorpromazine] Hives  . Voltaren [Diclofenac Sodium] Swelling  . Zofran [Ondansetron Hcl] Nausea Only     Review of Systems neg for any significant sore throat, dysphagia, itching, sneezing, nasal congestion or excess/ purulent secretions, fever, chills, sweats, unintended wt loss, pleuritic or exertional cp, hempoptysis, orthopnea pnd or change in chronic leg swelling. Also  denies presyncope, palpitations, heartburn, abdominal pain, nausea, vomiting, diarrhea or change in bowel or urinary habits, dysuria,hematuria, rash, arthralgias, visual complaints, headache, numbness weakness or ataxia.     Objective:   Physical Exam  Gen. Pleasant, obese, in no distress ENT - no lesions, no post nasal drip Neck: No JVD, no thyromegaly, no carotid bruits Lungs: no use of accessory muscles, no dullness to percussion, decreased without rales or rhonchi  Cardiovascular: Rhythm regular, heart sounds  normal, no murmurs or gallops, no peripheral edema Musculoskeletal: No deformities, no cyanosis or clubbing , no tremors       Assessment & Plan:    Acute on chronic cough-this seems to be related to acute bronchitis.  Chest x-ray did not show pneumonia.  However they did not give her any medications to complete the course from the ED.  I will give her a small prednisone taper.  For symptomatic relief, I will give her Hycodan cough syrup since this is worked for her in the past and she has obtained no relief with Robitussin-DM and Delsym.  I advised her not to take Norco while she is on this cough syrup. 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  She is on Xyzal and Protonix.  I note benazepril on her medication list and I have asked her to change from this to Benicar HCTZ.  We  will send a prescription for 30 days and she will monitor her blood pressure while on this

## 2021-01-01 NOTE — Patient Instructions (Signed)
  Prednisone 10 mg tabs  Take 2 tabs daily with food x 5ds, then 1 tab daily with food x 5ds then STOP Hycodan cough syrup twice daily x 7 days STOP taking Lotensin HCT START taking Benicar HCTZ 20/12.5 daily x 30 x 1 refill

## 2021-01-04 ENCOUNTER — Ambulatory Visit (HOSPITAL_COMMUNITY): Payer: 59 | Admitting: Physical Therapy

## 2021-01-07 ENCOUNTER — Telehealth (HOSPITAL_COMMUNITY): Payer: Self-pay | Admitting: Physical Therapy

## 2021-01-07 ENCOUNTER — Ambulatory Visit: Payer: 59 | Admitting: Internal Medicine

## 2021-01-07 ENCOUNTER — Ambulatory Visit (HOSPITAL_COMMUNITY): Payer: 59 | Admitting: Physical Therapy

## 2021-01-07 NOTE — Telephone Encounter (Signed)
No Show #2, previous no show not documented but supported in patient appointments. Called patient about missed appointments and no show policy. Cancelled all but next appointment secondary to no show policy, will formally discharge if patient no show's next appointment.  11:19 AM, 01/07/21 Beverly Tran, DPT Physical Therapy with [facility]  [phone] office

## 2021-01-12 ENCOUNTER — Telehealth (HOSPITAL_COMMUNITY): Payer: Self-pay | Admitting: Physical Therapy

## 2021-01-12 ENCOUNTER — Ambulatory Visit (HOSPITAL_COMMUNITY): Payer: 59 | Admitting: Physical Therapy

## 2021-01-12 NOTE — Telephone Encounter (Signed)
No show #3. Called and left VM about missed appointment and about no show policy. Will move to DC pt per no show policy.  3:34 PM, 01/12/21 Tereasa Coop, DPT Physical Therapy with [facility]  [phone] office

## 2021-01-14 ENCOUNTER — Ambulatory Visit (HOSPITAL_COMMUNITY): Payer: 59 | Admitting: Physical Therapy

## 2021-01-14 ENCOUNTER — Encounter (HOSPITAL_COMMUNITY): Payer: Self-pay | Admitting: Physical Therapy

## 2021-01-14 NOTE — Therapy (Signed)
[place], [place], [address] Phone: [phone]   Fax:  [phone]  Patient Details  Name: Beverly Tran MRN: [id number] Date of Birth: 23-Jun-1971 Referring Provider:  No ref. provider found  Encounter Date: 01/14/2021    PHYSICAL THERAPY DISCHARGE SUMMARY  Visits from Start of Care: 1  Current functional level related to goals / functional outcomes: Patient has not returned since initial evaluation and has violated no show policy.    Remaining deficits: Unknown as patient has not returned.   Education / Equipment: HEP  Plan: Patient agrees to discharge.  Patient goals were not met. Patient is being discharged due to not returning since the last visit.  ?????      2:45 PM, 01/14/21 Mearl Latin PT, DPT Physical Therapist at [place], [place], [address] Phone: [phone]   Fax:  [phone]

## 2021-01-18 ENCOUNTER — Other Ambulatory Visit: Payer: Self-pay | Admitting: Internal Medicine

## 2021-01-18 MED ORDER — PANTOPRAZOLE SODIUM 40 MG PO TBEC: 40.0000 mg | DELAYED_RELEASE_TABLET | Freq: Every day | ORAL | 5 refills | Status: DC

## 2021-01-19 ENCOUNTER — Encounter (HOSPITAL_COMMUNITY): Payer: 59 | Admitting: Physical Therapy

## 2021-01-20 ENCOUNTER — Telehealth: Payer: Self-pay | Admitting: Internal Medicine

## 2021-01-20 NOTE — Telephone Encounter (Signed)
PLEASE FAX LAB ORDER TO LAB SO PATIENT CAN GET THEM DONE

## 2021-01-20 NOTE — Telephone Encounter (Signed)
Phoned and advised the pt that the labs are already in the system @[facility]  nothing needs to be faxed.

## 2021-01-21 ENCOUNTER — Encounter (HOSPITAL_COMMUNITY): Payer: 59 | Admitting: Physical Therapy

## 2021-01-26 ENCOUNTER — Encounter (HOSPITAL_COMMUNITY): Payer: 59 | Admitting: Physical Therapy

## 2021-01-28 ENCOUNTER — Encounter (HOSPITAL_COMMUNITY): Payer: 59 | Admitting: Physical Therapy

## 2021-01-30 ENCOUNTER — Other Ambulatory Visit: Payer: Self-pay | Admitting: Pulmonary Disease

## 2021-02-01 NOTE — Patient Instructions (Signed)
Beverly Tran  02/01/2021     @   Your procedure is scheduled on  02/08/2021.   Report to [facility] at  0730  A.M.   Call this number if you have problems the morning of surgery:  [phone]   Remember:  Follow the diet and pre instructions given to you by the office.  Use your nebulizer before you come.  DO NOT take any medications for diabetes the morning of your procedure.  If your glucose is 70 or below the morning of your procedure, drink 1/2 cup of clear juice and recheck your glucose in 15 minutes. If your glucose is still 70 or below, call [phone] for instructions.  If your glucose is 300 or above the morning of your procedure, call [phone] for instructions.                    Take these medicines the morning of surgery with A SIP OF WATER Hydrocodone (if needed), protonix, lyrica, propranolol.     Please brush your teeth.  Do not wear jewelry, make-up or nail polish.  Do not wear lotions, powders, or perfumes, or deodorant.  Do not shave 48 hours prior to surgery.  Men may shave face and neck.  Do not bring valuables to the hospital.  [facility] is not responsible for any belongings or valuables.  Contacts, dentures or bridgework may not be worn into surgery.  Leave your suitcase in the car.  After surgery it may be brought to your room.  For patients admitted to the hospital, discharge time will be determined by your treatment team.  Patients discharged the day of surgery will not be allowed to drive home and must h ave someone with them for 24 hours.   Special instructions:  DO NOT smoke tobacco or vape for 24 hours before your procedure.  Please read over the following fact sheets that you were given. Anesthesia Post-op Instructions and Care and Recovery After Surgery       Upper Endoscopy, Adult, Care After This sheet gives you information about how to care for yourself after your procedure. Your  health care provider may also give you more specific instructions. If you have problems or questions, contact your health care provider. What can I expect after the procedure? After the procedure, it is common to have:  A sore throat.  Mild stomach pain or discomfort.  Bloating.  Nausea. Follow these instructions at home:  Follow instructions from your health care provider about what to eat or drink after your procedure.  Return to your normal activities as told by your health care provider. Ask your health care provider what activities are safe for you.  Take over-the-counter and prescription medicines only as told by your health care provider.  If you were given a sedative during the procedure, it can affect you for several hours. Do not drive or operate machinery until your health care provider says that it is safe.  Keep all follow-up visits as told by your health care provider. This is important.   Contact a health care provider if you have:  A sore throat that lasts longer than one day.  Trouble swallowing. Get help right away if:  You vomit blood or your vomit looks like coffee grounds.  You have: ? A fever. ? Bloody, black, or tarry stools. ? A severe sore throat or you cannot swallow. ? Difficulty breathing. ? Severe pain in  your chest or abdomen. Summary  After the procedure, it is common to have a sore throat, mild stomach discomfort, bloating, and nausea.  If you were given a sedative during the procedure, it can affect you for several hours. Do not drive or operate machinery until your health care provider says that it is safe.  Follow instructions from your health care provider about what to eat or drink after your procedure.  Return to your normal activities as told by your health care provider. This information is not intended to replace advice given to you by your health care provider. Make sure you discuss any questions you have with your health care  provider. Document Revised: 10/01/2019 Document Reviewed: 03/05/2018 Elsevier Patient Education  2021 Elsevier Inc.     https://www.asge.org/home/for-patients/patient-information/understanding-eso-dilation-updated">  Esophageal Dilatation Esophageal dilatation, also called esophageal dilation, is a procedure to widen or open a blocked or narrowed part of the esophagus. The esophagus is the part of the body that moves food and liquid from the mouth to the stomach. You may need this procedure if:  You have a buildup of scar tissue in your esophagus that makes it difficult, painful, or impossible to swallow. This can be caused by gastroesophageal reflux disease (GERD).  You have cancer of the esophagus.  There is a problem with how food moves through your esophagus. In some cases, you may need this procedure repeated at a later time to dilate the esophagus gradually. Tell a health care provider about:  Any allergies you have.  All medicines you are taking, including vitamins, herbs, eye drops, creams, and over-the-counter medicines.  Any problems you or family members have had with anesthetic medicines.  Any blood disorders you have.  Any surgeries you have had.  Any medical conditions you have.  Any antibiotic medicines you are required to take before dental procedures.  Whether you are pregnant or may be pregnant. What are the risks? Generally, this is a safe procedure. However, problems may occur, including:  Bleeding due to a tear in the lining of the esophagus.  A hole, or perforation, in the esophagus. What happens before the procedure?  Ask your health care provider about: ? Changing or stopping your regular medicines. This is especially important if you are taking diabetes medicines or blood thinners. ? Taking medicines such as aspirin and ibuprofen. These medicines can thin your blood. Do not take these medicines unless your health care provider tells you to take  them. ? Taking over-the-counter medicines, vitamins, herbs, and supplements.  Follow instructions from your health care provider about eating or drinking restrictions.  Plan to have a responsible adult take you home from the hospital or clinic.  Plan to have a responsible adult care for you for the time you are told after you leave the hospital or clinic. This is important. What happens during the procedure?  You may be given a medicine to help you relax (sedative).  A numbing medicine may be sprayed into the back of your throat, or you may gargle the medicine.  Your health care provider may perform the dilatation using various surgical instruments, such as: ? Simple dilators. This instrument is carefully placed in the esophagus to stretch it. ? Guided wire bougies. This involves using an endoscope to insert a wire into the esophagus. A dilator is passed over this wire to enlarge the esophagus. Then the wire is removed. ? Balloon dilators. An endoscope with a small balloon is inserted into the esophagus. The balloon is inflated  to stretch the esophagus and open it up. The procedure may vary among health care providers and hospitals. What can I expect after the procedure?  Your blood pressure, heart rate, breathing rate, and blood oxygen level will be monitored until you leave the hospital or clinic.  Your throat may feel slightly sore and numb. This will get better over time.  You will not be allowed to eat or drink until your throat is no longer numb.  When you are able to drink, urinate, and sit on the edge of the bed without nausea or dizziness, you may be able to return home. Follow these instructions at home:  Take over-the-counter and prescription medicines only as told by your health care provider.  If you were given a sedative during the procedure, it can affect you for several hours. Do not drive or operate machinery until your health care provider says that it is  safe.  Plan to have a responsible adult care for you for the time you are told. This is important.  Follow instructions from your health care provider about any eating or drinking restrictions.  Do not use any products that contain nicotine or tobacco, such as cigarettes, e-cigarettes, and chewing tobacco. If you need help quitting, ask your health care provider.  Keep all follow-up visits. This is important. Contact a health care provider if:  You have a fever.  You have pain that is not relieved by medicine. Get help right away if:  You have chest pain.  You have trouble breathing.  You have trouble swallowing.  You vomit blood.  You have black, tarry, or bloody stools. These symptoms may represent a serious problem that is an emergency. Do not wait to see if the symptoms will go away. Get medical help right away. Call your local emergency services (911 in the [place] Do not drive yourself to the hospital. Summary  Esophageal dilatation, also called esophageal dilation, is a procedure to widen or open a blocked or narrowed part of the esophagus.  Plan to have a responsible adult take you home from the hospital or clinic.  For this procedure, a numbing medicine may be sprayed into the back of your throat, or you may gargle the medicine.  Do not drive or operate machinery until your health care provider says that it is safe. This information is not intended to replace advice given to you by your health care provider. Make sure you discuss any questions you have with your health care provider. Document Revised: 02/19/2020 Document Reviewed: 02/19/2020 Elsevier Patient Education  2021 Elsevier Inc.  Colonoscopy, Adult, Care After This sheet gives you information about how to care for yourself after your procedure. Your health care provider may also give you more specific instructions. If you have problems or questions, contact your health care provider. What can I expect after  the procedure? After the procedure, it is common to have:  A small amount of blood in your stool for 24 hours after the procedure.  Some gas.  Mild cramping or bloating of your abdomen. Follow these instructions at home: Eating and drinking  Drink enough fluid to keep your urine pale yellow.  Follow instructions from your health care provider about eating or drinking restrictions.  Resume your normal diet as instructed by your health care provider. Avoid heavy or fried foods that are hard to digest.   Activity  Rest as told by your health care provider.  Avoid sitting for a long time without moving.  Get up to take short walks every 1-2 hours. This is important to improve blood flow and breathing. Ask for help if you feel weak or unsteady.  Return to your normal activities as told by your health care provider. Ask your health care provider what activities are safe for you. Managing cramping and bloating  Try walking around when you have cramps or feel bloated.  Apply heat to your abdomen as told by your health care provider. Use the heat source that your health care provider recommends, such as a moist heat pack or a heating pad. ? Place a towel between your skin and the heat source. ? Leave the heat on for 20-30 minutes. ? Remove the heat if your skin turns bright red. This is especially important if you are unable to feel pain, heat, or cold. You may have a greater risk of getting burned.   General instructions  If you were given a sedative during the procedure, it can affect you for several hours. Do not drive or operate machinery until your health care provider says that it is safe.  For the first 24 hours after the procedure: ? Do not sign important documents. ? Do not drink alcohol. ? Do your regular daily activities at a slower pace than normal. ? Eat soft foods that are easy to digest.  Take over-the-counter and prescription medicines only as told by your health care  provider.  Keep all follow-up visits as told by your health care provider. This is important. Contact a health care provider if:  You have blood in your stool 2-3 days after the procedure. Get help right away if you have:  More than a small spotting of blood in your stool.  Large blood clots in your stool.  Swelling of your abdomen.  Nausea or vomiting.  A fever.  Increasing pain in your abdomen that is not relieved with medicine. Summary  After the procedure, it is common to have a small amount of blood in your stool. You may also have mild cramping and bloating of your abdomen.  If you were given a sedative during the procedure, it can affect you for several hours. Do not drive or operate machinery until your health care provider says that it is safe.  Get help right away if you have a lot of blood in your stool, nausea or vomiting, a fever, or increased pain in your abdomen. This information is not intended to replace advice given to you by your health care provider. Make sure you discuss any questions you have with your health care provider. Document Revised: 09/27/2019 Document Reviewed: 04/29/2019 Elsevier Patient Education  2021 Elsevier Inc. Monitored Anesthesia Care, Care After This sheet gives you information about how to care for yourself after your procedure. Your health care provider may also give you more specific instructions. If you have problems or questions, contact your health care provider. What can I expect after the procedure? After the procedure, it is common to have:  Tiredness.  Forgetfulness about what happened after the procedure.  Impaired judgment for important decisions.  Nausea or vomiting.  Some difficulty with balance. Follow these instructions at home: For the time period you were told by your health care provider:  Rest as needed.  Do not participate in activities where you could fall or become injured.  Do not drive or use  machinery.  Do not drink alcohol.  Do not take sleeping pills or medicines that cause drowsiness.  Do not make important  decisions or sign legal documents.  Do not take care of children on your own.      Eating and drinking  Follow the diet that is recommended by your health care provider.  Drink enough fluid to keep your urine pale yellow.  If you vomit: ? Drink water, juice, or soup when you can drink without vomiting. ? Make sure you have little or no nausea before eating solid foods. General instructions  Have a responsible adult stay with you for the time you are told. It is important to have someone help care for you until you are awake and alert.  Take over-the-counter and prescription medicines only as told by your health care provider.  If you have sleep apnea, surgery and certain medicines can increase your risk for breathing problems. Follow instructions from your health care provider about wearing your sleep device: ? Anytime you are sleeping, including during daytime naps. ? While taking prescription pain medicines, sleeping medicines, or medicines that make you drowsy.  Avoid smoking.  Keep all follow-up visits as told by your health care provider. This is important. Contact a health care provider if:  You keep feeling nauseous or you keep vomiting.  You feel light-headed.  You are still sleepy or having trouble with balance after 24 hours.  You develop a rash.  You have a fever.  You have redness or swelling around the IV site. Get help right away if:  You have trouble breathing.  You have new-onset confusion at home. Summary  For several hours after your procedure, you may feel tired. You may also be forgetful and have poor judgment.  Have a responsible adult stay with you for the time you are told. It is important to have someone help care for you until you are awake and alert.  Rest as told. Do not drive or operate machinery. Do not drink  alcohol or take sleeping pills.  Get help right away if you have trouble breathing, or if you suddenly become confused. This information is not intended to replace advice given to you by your health care provider. Make sure you discuss any questions you have with your health care provider. Document Revised: 06/18/2020 Document Reviewed: 09/05/2019 Elsevier Patient Education  2021 [employer].

## 2021-02-02 ENCOUNTER — Encounter (HOSPITAL_COMMUNITY): Payer: 59 | Admitting: Physical Therapy

## 2021-02-02 ENCOUNTER — Ambulatory Visit: Payer: 59 | Admitting: Internal Medicine

## 2021-02-04 ENCOUNTER — Other Ambulatory Visit (HOSPITAL_COMMUNITY): Payer: MEDICAID | Attending: Internal Medicine

## 2021-02-04 ENCOUNTER — Encounter (HOSPITAL_COMMUNITY)
Admission: RE | Admit: 2021-02-04 | Discharge: 2021-02-04 | Disposition: A | Discharge status: Home / Self Care | Payer: 59 | Source: Ambulatory Visit | Attending: Internal Medicine | Admitting: Internal Medicine

## 2021-02-04 ENCOUNTER — Encounter (HOSPITAL_COMMUNITY): Payer: Self-pay

## 2021-02-04 ENCOUNTER — Encounter (HOSPITAL_COMMUNITY): Payer: 59 | Admitting: Physical Therapy

## 2021-02-04 ENCOUNTER — Telehealth: Payer: Self-pay | Admitting: *Deleted

## 2021-02-04 NOTE — Telephone Encounter (Signed)
Called pt, received message she is not accepting calls at this time

## 2021-02-04 NOTE — Telephone Encounter (Signed)
-----   Message from Elsie Amis, RN sent at 02/04/2021  4:11 PM EDT ----- Regarding: no show. Hey Beverly Tran! Beverly Tran did not show for her PAT and COVID today.

## 2021-02-05 NOTE — Telephone Encounter (Signed)
Procedure cancelled for Monday. Letter mailed. Will need OV to get rescheduled

## 2021-02-05 NOTE — Telephone Encounter (Signed)
Called pt. "Not accepting calls"

## 2021-02-08 ENCOUNTER — Encounter (HOSPITAL_COMMUNITY): Admission: RE | Payer: Self-pay | Source: Home / Self Care

## 2021-02-08 ENCOUNTER — Ambulatory Visit (HOSPITAL_COMMUNITY): Admission: RE | Admit: 2021-02-08 | Payer: 59 | Source: Home / Self Care | Admitting: Internal Medicine

## 2021-02-08 SURGERY — COLONOSCOPY WITH PROPOFOL
Anesthesia: Monitor Anesthesia Care

## 2021-02-09 ENCOUNTER — Encounter (HOSPITAL_COMMUNITY): Payer: 59 | Admitting: Physical Therapy

## 2021-02-11 ENCOUNTER — Encounter (HOSPITAL_COMMUNITY): Payer: 59 | Admitting: Physical Therapy

## 2021-02-16 ENCOUNTER — Ambulatory Visit: Payer: 59 | Admitting: Internal Medicine

## 2021-03-13 ENCOUNTER — Other Ambulatory Visit: Payer: Self-pay | Admitting: Pulmonary Disease

## 2021-03-13 ENCOUNTER — Other Ambulatory Visit: Payer: Self-pay | Admitting: Internal Medicine

## 2021-03-18 ENCOUNTER — Ambulatory Visit (INDEPENDENT_AMBULATORY_CARE_PROVIDER_SITE_OTHER): Payer: 59 | Admitting: Internal Medicine

## 2021-03-18 ENCOUNTER — Encounter: Payer: Self-pay | Admitting: Internal Medicine

## 2021-03-18 ENCOUNTER — Other Ambulatory Visit (HOSPITAL_COMMUNITY): Payer: Self-pay | Admitting: Neurology

## 2021-03-18 ENCOUNTER — Other Ambulatory Visit: Payer: Self-pay

## 2021-03-18 DIAGNOSIS — G8929 Other chronic pain: Secondary | ICD-10-CM

## 2021-03-18 DIAGNOSIS — R058 Other specified cough: Secondary | ICD-10-CM

## 2021-03-18 NOTE — Progress Notes (Signed)
Beverly Tran, female    DOB: [birth year]     MRN: [id number]   Brief patient profile:  50 yowf quit smoking 01/2020  "born with bronchitis"  (mother smoked)  On "breathing pill they quit making" then around age 50 changed to  inhalers as needed  and ever since and did fine s maint rx   until April 2021 with onset of cough refractory to all rx including  x for hydocodone so referred to pulmonary clinic 04/14/2020 by Dr   Sherryll Burger      History of Present Illness  04/14/2020  Pulmonary/ 1st office eval/Shloima Clinch  S/p 2nd of moderna 04/02/20  Chief Complaint  Patient presents with  . Pulmonary Consult    Referred by Dr Sherryll Burger. Pt c/o cough x 2 months. Cough is non prod and worse at night. She has found the only thing that has helped is hydrocodone cough syrup.   Dyspnea:  Only problem is heat - does fine in a/c including housework  Cough:worse at hs / dry sometimes choking  Sleep: bed is flat 2 pillows  SABA use: not helping cough  maint now on singulair / not using symbicort// already on gabapentin 300 tid and lyrica rec Valsartan 160-12.5 one daily instead of lotensin Add pepcid 20 mg one after supper  until return  GERD diet/ bedblocks For cough > hydrocodone up to a tsp every 4 hours as needed but should be better w/in 2 weeks Please schedule a follow up office visit in 6 weeks, call sooner if needed with all medications /inhalers/ solutions in hand so we can verify exactly what you are taking. This includes all medications from all doctors and over the counters    11/25/2020  f/u ov/[place] office/Lacreasha Hinds re:  Def doing much  better p last ov until 1st of 2022 eval by Sherryll Burger NP with nasal congestion / clear mucus / did not bring meds as requested Chief Complaint  Patient presents with  . Follow-up    Nasal congestion, non productive cough  Dyspnea: was doing fine until onset flare 1s of the year  Cough: worse when puts head down/ noct cough and in am  Sleeping: bed is flat/ 2 pillows  SABA  use: hfa not helping/ neb helps  more  02: 2lpm hs and  Prn daytime Covid status:2nd pfizer 09/08/20  Lung cancer screening: does not meet age criteria rec Discuss stopping the inderal with your neurologist  Pantoprazole (protonix) 40 mg   Take  30-60 min before first meal of the day and Pepcid (famotidine)  20 mg one after supper until return to office - this is the best way to tell whether stomach acid is contributing to your problem.   GERD Augmentin 875 mg take one pill twice daily  X 10 days - take at breakfast and supper with large glass of water.  It would help reduce the usual side effects (diarrhea and yeast infections) if you ate cultured yogurt at lunch.  Prednisone 10 mg take  4 each am x 2 days,   2 each am x 2 days,  1 each am x 2 days and stop  For cough > hydrocodone up to a tsp every 4 hours as needed but should be better w/in 2 weeks Please remember to go to the lab department  > did not go  Please schedule a follow up office visit in 6 weeks, call sooner if needed with pfts on return > did not do  Vassie Loll recs  01/01/21  Prednisone 10 mg tabs  Take 2 tabs daily with food x 5ds, then 1 tab daily with food x 5ds then STOP Hycodan cough syrup twice daily x 7 days STOP taking Lotensin HCT START taking Benicar HCTZ 20/12.5 daily x 30 x 1 refill   03/18/2021  f/u ov/[place] office/Brittanee Ghazarian re:  AB vs UACS  Chief Complaint  Patient presents with  . Follow-up    No complaints currently   Dyspnea:  Limited by back/ R hip/ wt > doe walking with cane  Cough: minimal am mucus white assoc with rhinitis rx pred  Sleeping: bed blocks SABA use: duoneb not using  02: has it not using  Covid status: vax x 2  Lung cancer screening: n/a    No obvious day to day or daytime variability or assoc  purulent sputum or mucus plugs or hemoptysis or cp or chest tightness, subjective wheeze or overt sinus or hb symptoms.   sleepiing  without nocturnal   exacerbation  of respiratory  c/o's or  need for noct saba. Also denies any obvious fluctuation of symptoms with weather or environmental changes or other aggravating or alleviating factors except as outlined above   No unusual exposure hx or h/o childhood pna/ asthma or knowledge of premature birth.  Current Allergies, Complete Past Medical History, Past Surgical History, Family History, and Social History were reviewed in [employer] record.  ROS  The following are not active complaints unless bolded Hoarseness, sore throat, dysphagia, dental problems, itching, sneezing,  nasal congestion or discharge of excess mucus or purulent secretions, ear ache,   fever, chills, sweats, unintended wt loss or wt gain, classically pleuritic or exertional cp,  orthopnea pnd or arm/hand swelling  or leg swelling, presyncope, palpitations, abdominal pain, anorexia, nausea, vomiting, diarrhea  or change in bowel habits or change in bladder habits, change in stools or change in urine, dysuria, hematuria,  rash, arthralgias, visual complaints, headache, numbness, weakness or ataxia or problems with walking or coordination,  change in mood or  memory.        Current Meds  Medication Sig  . baclofen (LIORESAL) 10 MG tablet Take 10 mg by mouth 2 (two) times daily.  . famotidine (PEPCID) 20 MG tablet TAKE ONE TABLET BY MOUTH AFTER SUPPER.  Marland Kitchen HYDROcodone-homatropine (HYCODAN) 5-1.5 MG/5ML syrup Take 5 mLs by mouth every 8 (eight) hours as needed for cough.  Marland Kitchen ipratropium-albuterol (DUONEB) 0.5-2.5 (3) MG/3ML SOLN Take 3 mLs by nebulization every 4 (four) hours as needed (shortness of breath or wheezing).  Marland Kitchen levocetirizine (XYZAL) 5 MG tablet Take 5 mg by mouth at bedtime.  . metFORMIN (GLUCOPHAGE) 500 MG tablet Take 500-1,000 mg by mouth See admin instructions. Take 1000 mg by mouth in the morning and 500 mg in the evening  . montelukast (SINGULAIR) 10 MG tablet Take 10 mg by mouth daily with supper.  . olmesartan-hydrochlorothiazide  (BENICAR HCT) 20-12.5 MG tablet TAKE ONE TABLET BY MOUTH DAILY  . oxyCODONE-acetaminophen (PERCOCET/ROXICET) 5-325 MG tablet Take 1 tablet by mouth every 8 (eight) hours as needed for severe pain.  . pantoprazole (PROTONIX) 40 MG tablet Take 1 tablet (40 mg total) by mouth daily. (Patient taking differently: Take 40 mg by mouth in the morning.)  . pioglitazone (ACTOS) 45 MG tablet Take 45 mg by mouth daily.  . pramipexole (MIRAPEX) 0.5 MG tablet Take 0.5 mg by mouth at bedtime.  . predniSONE (DELTASONE) 10 MG tablet Take 2 tablets daily  with food for five days, then 1 tablet daily with food for five days then STOP  . pregabalin (LYRICA) 300 MG capsule Take 300 mg by mouth 2 (two) times daily.  . propranolol (INDERAL) 10 MG tablet Take 10 mg by mouth 2 (two) times daily.  . QUEtiapine (SEROQUEL) 50 MG tablet Take 50 mg by mouth at bedtime. Take 2 tablets at bedtime  . rosuvastatin (CRESTOR) 5 MG tablet Take 5 mg by mouth at bedtime.  . vitamin E 180 MG (400 UNITS) capsule Take 400 Units by mouth daily.  . [DISCONTINUED] HYDROcodone-acetaminophen (NORCO) 10-325 MG tablet Take 1 tablet by mouth in the morning and at bedtime.             Past Medical History:  Diagnosis Date  . Anxiety   . Asthma   . Back pain, chronic   . Depression   . High cholesterol   . Hypertension   . Migraine          Objective:       03/18/2021          219   11/25/20 213 lb 6.4 oz (96.8 kg)  04/14/20 210 lb (95.3 kg)  10/12/16 190 lb (86.2 kg)    Vital signs reviewed  03/18/2021  - Note at rest 02 sats  94% on RA   General appearance:    Obese wf walks with cane / rattling cough   HEENT : pt wearing mask not removed for exam due to covid - 19 concerns.   NECK :  without JVD/Nodes/TM/ nl carotid upstrokes bilaterally   LUNGS: no acc muscle use,  Min barrel  contour chest wall with bilateral  slightly decreased bs s audible wheeze and  without cough on insp or exp maneuvers and min  Hyperresonant  to   percussion bilaterally     CV:  RRR  no s3 or murmur or increase in P2, and no edema   ABD: obese  soft and nontender with pos end  insp Hoover's  in the supine position. No bruits or organomegaly appreciated, bowel sounds nl  MS:      ext warm without deformities, calf tenderness, cyanosis or clubbing No obvious joint restrictions   SKIN: warm and dry without lesions    NEURO:  alert, approp, nl sensorium with  no motor or cerebellar deficits apparent.            Labs ordered 03/18/2021 :  allergy profile   alpha one AT phenotype          Assessment

## 2021-03-18 NOTE — Assessment & Plan Note (Signed)
Onset was April 2021  - try off acei 04/14/2020 and pepcid 20 mg after supper - flare Oct 17 2020 assoc rhinitis? Sinusitis > rec augmentin/pred and cyclical cough rx  -Labs ordered 11/25/2020  :  allergy profile   alpha one AT phenotype  - 03/18/2021 much better off acei > f/u pfts ordered    Upper airway cough syndrome (previously labeled PNDS),  is so named because it's frequently impossible to sort out how much is  CR/sinusitis with freq throat clearing (which can be related to primary GERD)   vs  causing  secondary (" extra esophageal")  GERD from wide swings in gastric pressure that occur with throat clearing, often  promoting self use of mint and menthol lozenges that reduce the lower esophageal sphincter tone and exacerbate the problem further in a cyclical fashion.   These are the same pts (now being labeled as having "irritable larynx syndrome" by some cough centers) who not infrequently have a history of having failed to tolerate ace inhibitors,  dry powder inhalers or biphosphonates or report having atypical/extraesophageal reflux symptoms that don't respond to standard doses of PPI  and are easily confused as having aecopd or asthma flares by even experienced allergists/ pulmonologists (myself included).   >>> continue off acei and on gerd rx indefinitely and complete the w/u planned  Re albuterol: If your breathing worsens or you need to use your rescue inhaler more than twice weekly or wake up more than twice a month with any respiratory symptoms or require more than two rescue inhalers per year, we need to see you right away because this means we're not controlling the underlying problem (inflammation) adequately.  Rescue inhalers (albuterol) do not control inflammation and overuse can lead to unnecessary and costly consequences.  They can make you feel better temporarily but eventually they will quit working effectively much as sleep aids lead to more insomnia if used regularly.     >>>  f/u prn          Each maintenance medication was reviewed in detail including emphasizing most importantly the difference between maintenance and prns and under what circumstances the prns are to be triggered using an action plan format where appropriate.  Total time for H and P, chart review, counseling, reviewing neb  device(s) and generating customized AVS unique to this office visit / same day charting =23 min

## 2021-03-18 NOTE — Patient Instructions (Addendum)
No change in recommendations   Ok to use duoneb if you must but not regularly  - call if use goes up   Please remember to go to the lab department @ [facility] for your tests when you go for your PFTs - we will call you with the results when they are available.  Reschedule the PFTs at [facility] and I will call you with the results

## 2021-03-22 ENCOUNTER — Other Ambulatory Visit: Payer: Self-pay | Admitting: Internal Medicine

## 2021-03-22 DIAGNOSIS — Z139 Encounter for screening, unspecified: Secondary | ICD-10-CM

## 2021-03-23 ENCOUNTER — Telehealth: Payer: Self-pay

## 2021-03-23 NOTE — Telephone Encounter (Signed)
Returned call to Ms Porras who is requesting a resource. No answer, left message.  Francee Nodal RN Clara Gunn/ care connect

## 2021-03-24 ENCOUNTER — Ambulatory Visit: Payer: 59 | Admitting: Internal Medicine

## 2021-03-31 ENCOUNTER — Ambulatory Visit (HOSPITAL_COMMUNITY): Payer: 59

## 2021-03-31 ENCOUNTER — Encounter (HOSPITAL_COMMUNITY): Payer: Self-pay

## 2021-04-01 ENCOUNTER — Other Ambulatory Visit (HOSPITAL_COMMUNITY): Payer: 59

## 2021-04-06 ENCOUNTER — Ambulatory Visit (INDEPENDENT_AMBULATORY_CARE_PROVIDER_SITE_OTHER): Payer: 59 | Admitting: Gastroenterology

## 2021-04-06 ENCOUNTER — Telehealth: Payer: Self-pay

## 2021-04-06 ENCOUNTER — Encounter: Payer: Self-pay | Admitting: Gastroenterology

## 2021-04-06 ENCOUNTER — Other Ambulatory Visit: Payer: Self-pay

## 2021-04-06 VITALS — BP 112/78 | HR 76 | Temp 97.1°F | Ht 63.0 in | Wt 219.8 lb

## 2021-04-06 DIAGNOSIS — K529 Noninfective gastroenteritis and colitis, unspecified: Secondary | ICD-10-CM

## 2021-04-06 DIAGNOSIS — K219 Gastro-esophageal reflux disease without esophagitis: Secondary | ICD-10-CM

## 2021-04-06 MED ORDER — COLESTIPOL HCL 1 G PO TABS: 2.0000 g | ORAL_TABLET | Freq: Two times a day (BID) | ORAL | 1 refills | Status: DC

## 2021-04-06 MED ORDER — PROMETHAZINE HCL 25 MG PO TABS: 25.0000 mg | ORAL_TABLET | Freq: Four times a day (QID) | ORAL | 1 refills | Status: DC | PRN

## 2021-04-06 NOTE — Progress Notes (Signed)
Referring Provider: Kirstie Peri, MD Primary Care Physician:  Kirstie Peri, MD Primary GI Physician: Rourk  Chief Complaint  Patient presents with   Irritable Bowel Syndrome    F/u. Diarrhea mostly daily, nausea all the time   Colonoscopy    Needs to get rescheduled    HPI:   Beverly Tran is a 50 y.o. female presenting today with a history of GERD, IBS-D and dysphagia.GERD well managed at this time. Diarrhea and dysphagia are ongoing.   GERD: Once daily protonix in the morning as well as well as pepcid after dinner, Dr. Sherene Sires with Pulmonary prescribed these. She denies reflux symptoms. Doing well on current regimen.  Diarrhea: Ongoing with associated abdominal cramping and nausea. Has tried bentyl in the past for cramping without relief as well as a course of xifaxin in March with no decrease in diarrhea. She reports that she feels diarrhea really started after having her gallbladder removed in 1989.   Endorses 4-5 episodes of diarrhea per day, reports soft, milkshake like consistency, occasionally watery. She does report nocturnal fecal incontinence 2-3x/month. She denies melena or hematochezia, no vomiting, does endorse flatulence and associated lower abdominal pain. Diet does not seem to influence her symptoms. She did not have celiac serologies ordered at previous visit with our office completed.   Dysphagia:  She was previously scheduled for EGD +/- dilation in April for dysphagia, however she had to cancel the procedure. Reports dysphagia has continued and mostly occurs with food, sometimes with her medications. She has to ensure she drinks plenty of liquid when she eats in order to get her food down. She denies odynophagia, early satiety or weight loss.    Last Colonoscopy: (2003) poor colonic prep, grossly normal rectum, colon and TI. (No path available) Last Endoscopy: (2003) normal esophagus, small bowel biopsy (path not available)  Recommendations:  Schedule screening  colonoscopy and EGD for possible esophageal dilation w/ Dr. Jena Gauss   Past Medical History:  Diagnosis Date   Anxiety    Asthma    Back pain, chronic    Depression    GERD (gastroesophageal reflux disease)    High cholesterol    Hypertension    IBS (irritable bowel syndrome)    Migraine     Past Surgical History:  Procedure Laterality Date   APPENDECTOMY     CESAREAN SECTION     CHOLECYSTECTOMY     COLONOSCOPY  2003   poor prep, grossly normal rectum, colon, and TI. Path not available.    ESOPHAGOGASTRODUODENOSCOPY  2003   Dr. Jena Gauss:  normal esophagus, stomach, duodenum s/p small bowel biopsy.    HERNIA REPAIR     TUBAL LIGATION      Current Outpatient Medications  Medication Sig Dispense Refill   baclofen (LIORESAL) 10 MG tablet Take 10 mg by mouth 2 (two) times daily.     eszopiclone (LUNESTA) 1 MG TABS tablet Take 1 mg by mouth at bedtime.     famotidine (PEPCID) 20 MG tablet TAKE ONE TABLET BY MOUTH AFTER SUPPER. 30 tablet 10   hydrALAZINE (APRESOLINE) 25 MG tablet Take 25 mg by mouth daily.     ipratropium-albuterol (DUONEB) 0.5-2.5 (3) MG/3ML SOLN Take 3 mLs by nebulization every 4 (four) hours as needed (shortness of breath or wheezing).     levocetirizine (XYZAL) 5 MG tablet Take 5 mg by mouth at bedtime.     levofloxacin (LEVAQUIN) 750 MG tablet Take 750 mg by mouth daily.     metFORMIN (  GLUCOPHAGE) 500 MG tablet Take 500-1,000 mg by mouth See admin instructions. Take 1000 mg by mouth in the morning and 500 mg in the evening     montelukast (SINGULAIR) 10 MG tablet Take 10 mg by mouth daily with supper.     olmesartan (BENICAR) 20 MG tablet Take 20 mg by mouth daily.     oxyCODONE-acetaminophen (PERCOCET) 7.5-325 MG tablet 1 tablet 3 times per day as needed     pantoprazole (PROTONIX) 40 MG tablet Take 1 tablet (40 mg total) by mouth daily. (Patient taking differently: Take 40 mg by mouth in the morning.) 30 tablet 5   pioglitazone (ACTOS) 45 MG tablet Take 45 mg by  mouth daily.     pramipexole (MIRAPEX) 0.5 MG tablet Take 0.5 mg by mouth at bedtime.     predniSONE (DELTASONE) 10 MG tablet Take 2 tablets daily with food for five days, then 1 tablet daily with food for five days then STOP 15 tablet 0   pregabalin (LYRICA) 300 MG capsule Take 300 mg by mouth 2 (two) times daily.     promethazine (PHENERGAN) 25 MG tablet Take 25 mg by mouth every 8 (eight) hours as needed for nausea or vomiting.     propranolol (INDERAL) 10 MG tablet Take 10 mg by mouth 2 (two) times daily.     QUEtiapine (SEROQUEL) 50 MG tablet Take 50 mg by mouth at bedtime.     rosuvastatin (CRESTOR) 5 MG tablet Take 5 mg by mouth at bedtime.     vitamin E 180 MG (400 UNITS) capsule Take 400 Units by mouth daily.     HYDROcodone-homatropine (HYCODAN) 5-1.5 MG/5ML syrup Take 5 mLs by mouth every 8 (eight) hours as needed for cough. (Patient not taking: Reported on 04/06/2021) 240 mL 0   No current facility-administered medications for this visit.    Allergies as of 04/06/2021 - Review Complete 04/06/2021  Allergen Reaction Noted   Diclofenac Anaphylaxis 11/25/2020   Salami [pickled meat] Shortness Of Breath and Swelling 04/19/2013   Aspirin Other (See Comments) 03/02/2012   Bee venom Swelling 04/19/2013   Compazine [prochlorperazine] Hives 03/02/2012   Imitrex [sumatriptan] Hives 03/02/2012   Nubain [nalbuphine hcl] Swelling and Other (See Comments) 03/02/2012   Thorazine [chlorpromazine] Hives 03/02/2012   Voltaren [diclofenac sodium] Swelling 03/02/2012   Zofran [ondansetron hcl] Nausea Only 05/04/2013    Family History  Problem Relation Age of Onset   Seizures Mother    Heart failure Mother    Heart failure Father    Colon cancer Neg Hx    Colon polyps Neg Hx     Social History   Socioeconomic History   Marital status: Divorced    Spouse name: Not on file   Number of children: Not on file   Years of education: Not on file   Highest education level: Not on file   Occupational History   Not on file  Tobacco Use   Smoking status: Former    Packs/day: 1.00    Years: 7.00    Pack years: 7.00    Types: Cigarettes    Quit date: 02/03/2020    Years since quitting: 1.1   Smokeless tobacco: Never  Substance and Sexual Activity   Alcohol use: No   Drug use: No   Sexual activity: Yes    Birth control/protection: Surgical  Other Topics Concern   Not on file  Social History Narrative   Not on file   Social Determinants of Health  Financial Resource Strain: Not on file  Food Insecurity: Not on file  Transportation Needs: Not on file  Physical Activity: Not on file  Stress: Not on file  Social Connections: Not on file    Review of Systems: Gen: Denies fever, chills, anorexia. Denies fatigue, weakness, weight loss.  CV: Denies chest pain, palpitations, syncope, peripheral edema, and claudication. Resp: Denies dyspnea at rest, cough, coughing up blood, and pleurisy. Endorses wheezing. GI: Denies vomiting blood, jaundice, or odynophagia. Endorses dysphagia and nocturnal fecal incontinence. Derm: Denies rash, itching, dry skin Psych: Denies depression, anxiety, memory loss, confusion. No homicidal or suicidal ideation.  Heme: Denies bruising or bleeding.  Physical Exam: BP 112/78   Pulse 76   Temp (!) 97.1 F (36.2 C)   Ht 5\' 3"  (1.6 m)   Wt 219 lb 12.8 oz (99.7 kg)   BMI 38.94 kg/m  General:   Alert and oriented. No distress noted. Pleasant and cooperative.  Head:  Normocephalic and atraumatic. Mouth:  Oral mucosa pink and moist. Good dentition. No lesions. Heart: Normal rate and rhythm, s1 and s2 heart sounds present.  Lungs: Diffuse wheezing in all lung fields Abdomen:  +BS, soft, and non-distended. No rebound or guarding. No HSM or masses noted. TTP of lower abdomen.  Msk:  Symmetrical without gross deformities. Normal posture. Extremities:  Without edema. Neurologic:  Alert and  oriented x4 Psych:  Alert and cooperative. Normal  mood and affect.  ASSESSMENT: Beverly Tran is a 50 y.o. female presenting today with ongoing, chronic diarrhea with associated abdominal cramping and nausea, as well as dysphagia.   She has tried bentyl and xifaxin in the past without relief of diarrhea and abdominal pain. 4-5 episodes of diarrhea daily with some nocturnal fecal incontinence. Feels that symptoms really began post cholecystectomy in 1989. Suspect possible bile acid diarrhea, recommend trial of Colestid. Could try levsin if colestid does not provide any relief of symptoms. Discussed med holiday of metformin as it can cause GI upset, however, patient states that she has only been on this for about a year and diarrhea started long before she began metformin, does not feel like this med is contributory to her symptoms.  Dysphagia is ongoing with both food and pills. Needs EGD +/- Dilation with Dr. 1990. Will schedule colonoscopy with random colonic biopsies to r/o further causes of chronic diarrhea. Patient was supposed to have celiac serologies drawn after visit with Jena Gauss in February, however, she forgot, will have those drawn at this time.    PLAN:  Trial Colestid for diarrhea, start with 2 tabs once daily then can increase to BID if well tolerate. Can try levsin if no relief of symptoms from colestid.  2. Will have celiac serologies ordered from previous visit drawn 3. EGD+/- Dilation and colonoscopy with random colonic biopsies with Dr. March  4. Continue to take small bites and chew thoroughly to avoid choking 5. 1 refill of phenergan provided at patient's request  <ASA II> **Hold Metformin and Actos on day of procedure.  Patient not currently anticoagulated, on antiplatelet therapy or Aspirin.   Indications, risks and benefits of procedure discussed in detail with patient. Patient verbalized understanding and is in agreement to proceed with EGD/colonoscopy at this time.   Follow Up: After EGD/Colonoscopy  Chelsea L.  Jena Gauss, MSN, APRN, AGNP-C [facility]    I was present for visit and patient seen in conjunction with Jeanmarie Hubert, MSN, APRN, AGNP-C. Patient also seen by me several  months ago. As diarrhea has been many years, we have held off on stool studies as she is at baseline. Agree with above. Gelene MinkAnna W. Alyene Predmore, PhD, ANP-BC [facility]

## 2021-04-06 NOTE — Patient Instructions (Signed)
Will schedule colonoscopy and EGD with possible esophageal dilation. Take small bites and chew thoroughly to avoid choking.   Continue protonix daily and pepcid in the evenings.   Will send Colestid for diarrhea and Levsin for abdominal cramping to pharmacy.

## 2021-04-06 NOTE — Telephone Encounter (Signed)
Will call pt to schedule TCS/EGD/-/+DIL w/Propofol w/Dr. Jena Gauss ASA 2 when his future schedule is available.

## 2021-04-08 ENCOUNTER — Ambulatory Visit: Payer: 59 | Admitting: Internal Medicine

## 2021-04-12 ENCOUNTER — Telehealth: Payer: Self-pay | Admitting: Internal Medicine

## 2021-04-12 NOTE — Telephone Encounter (Signed)
ERROR

## 2021-04-14 ENCOUNTER — Other Ambulatory Visit: Payer: Self-pay

## 2021-04-14 ENCOUNTER — Ambulatory Visit (HOSPITAL_COMMUNITY)
Admission: RE | Admit: 2021-04-14 | Discharge: 2021-04-14 | Disposition: A | Discharge status: Home / Self Care | Payer: 59 | Source: Ambulatory Visit | Attending: Neurology | Admitting: Neurology

## 2021-04-14 DIAGNOSIS — M545 Low back pain, unspecified: Secondary | ICD-10-CM | POA: Insufficient documentation

## 2021-04-14 DIAGNOSIS — G8929 Other chronic pain: Secondary | ICD-10-CM | POA: Insufficient documentation

## 2021-04-14 IMAGING — MR MR LUMBAR SPINE W/O CM
5 series · 31 of 48 positions shown · non-contrast
Comparison: None.

CLINICAL DATA: Low back pain.

EXAM:
MRI LUMBAR SPINE WITHOUT CONTRAST
TECHNIQUE: Multiplanar, multisequence MR imaging of the lumbar spine was
performed. No intravenous contrast was administered.

[Series 5: T2 · sagittal · 4.0mm · 0.68mm/px · 6 of 15 slices shown (1 of 2)]
[im 1/15]
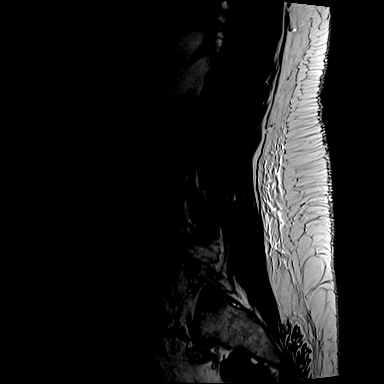
[im 3/15]
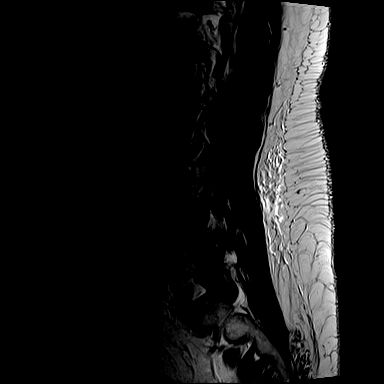
[im 6/15]
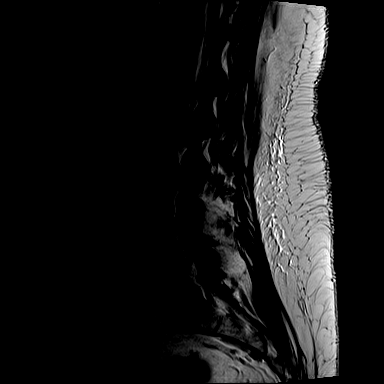
[im 9/15]
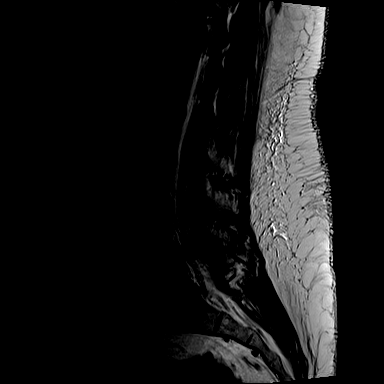
[im 12/15]
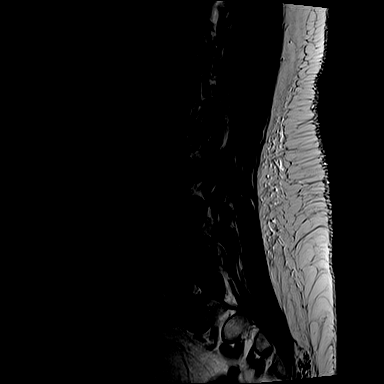
[im 15/15]
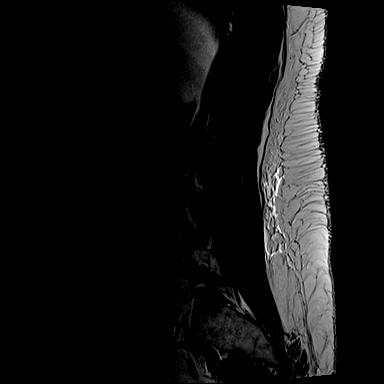

[Series 6: T1 · sagittal · 4.0mm · 0.81mm/px · 7 of 15 slices shown (1 of 2)]
[im 1/15]
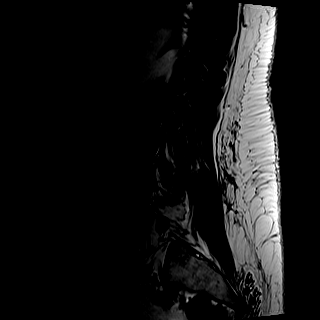
[im 3/15]
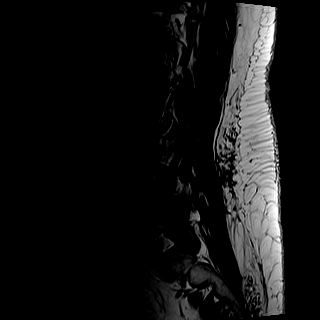
[im 5/15]
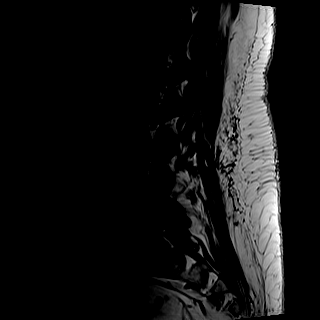
[im 8/15]
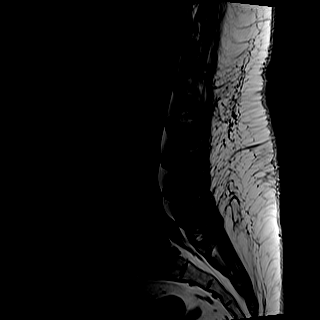
[im 10/15]
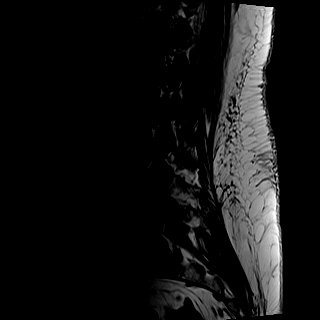
[im 12/15]
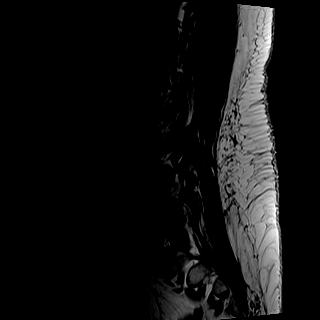
[im 15/15]
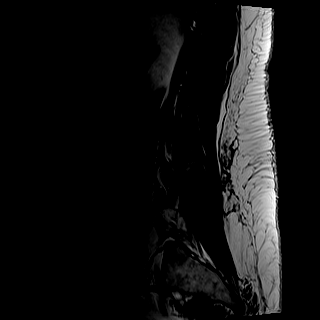

[Series 7: STIR · sagittal · 4.0mm · 0.51mm/px · 2 of 15 slices shown]
[im 1/15]
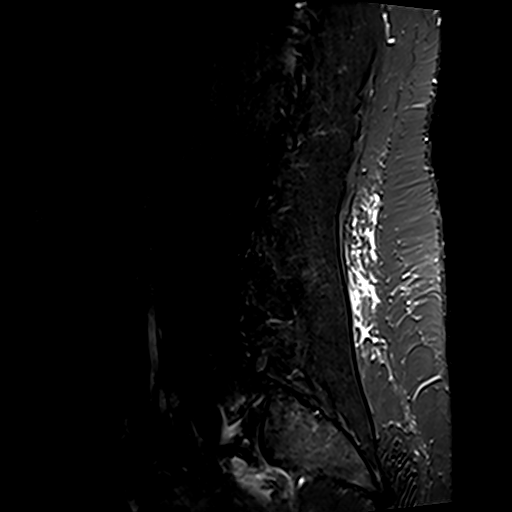
[im 3/15]
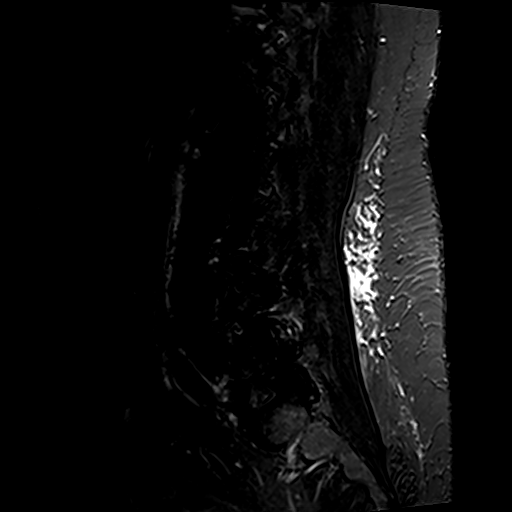

[Series 8: T2 · axial · 4.0mm · 0.70mm/px · z∈[-110,+67]mm · 8 of 31 slices shown (2 of 2)]
[im 1/31]
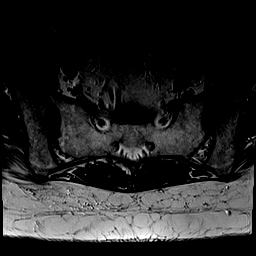
[im 5/31]
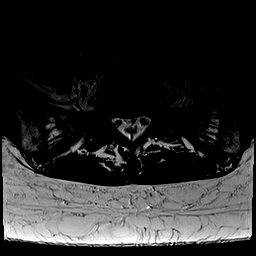
[im 10/31]
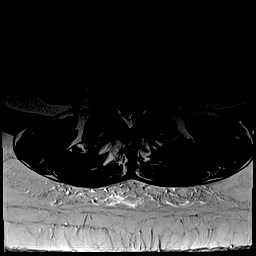
[im 14/31]
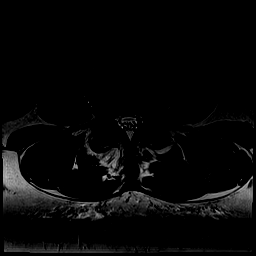
[im 17/31]
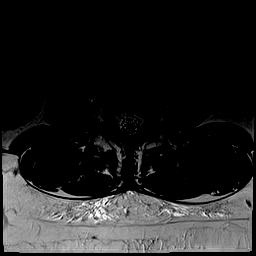
[im 21/31]
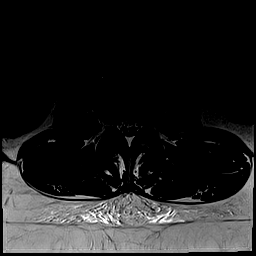
[im 26/31]
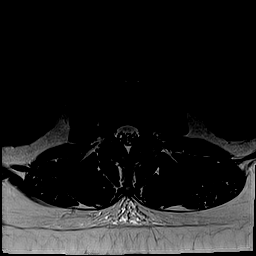
[im 31/31]
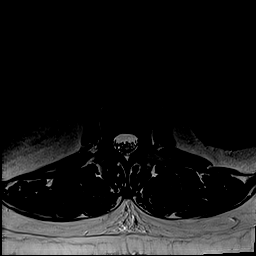

[Series 9: T1 · axial · 4.0mm · 0.35mm/px · z∈[-110,+67]mm · 8 of 31 slices shown (2 of 2)]
[im 1/31]
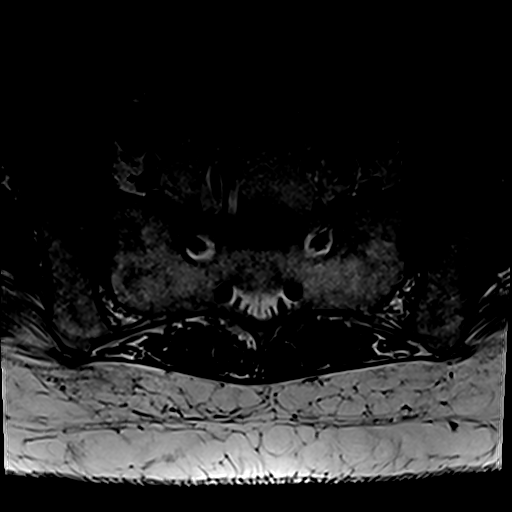
[im 5/31]
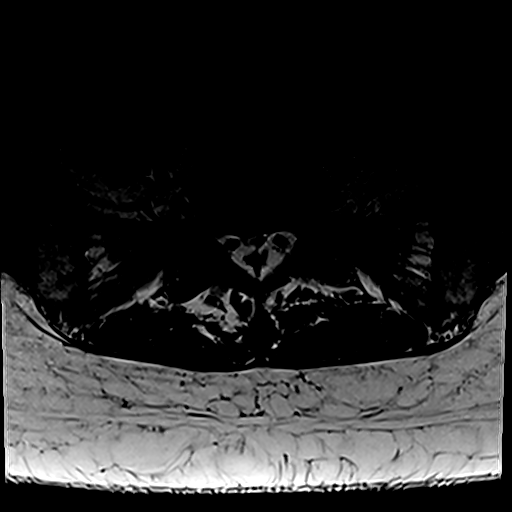
[im 10/31]
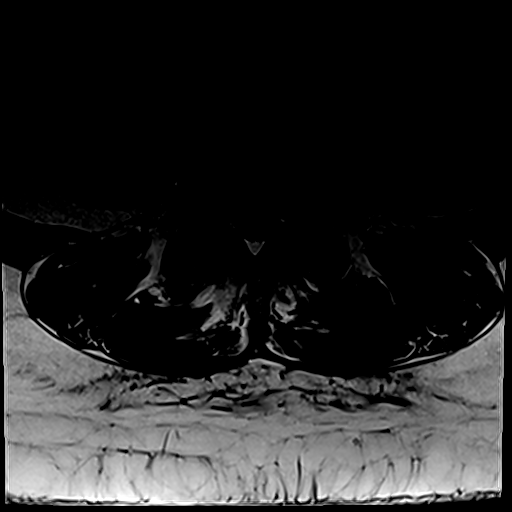
[im 14/31]
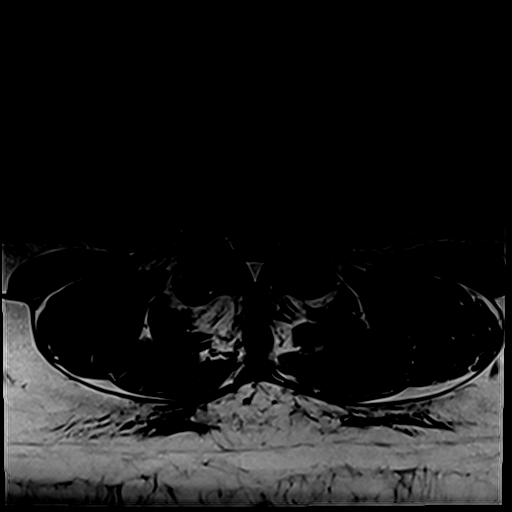
[im 17/31]
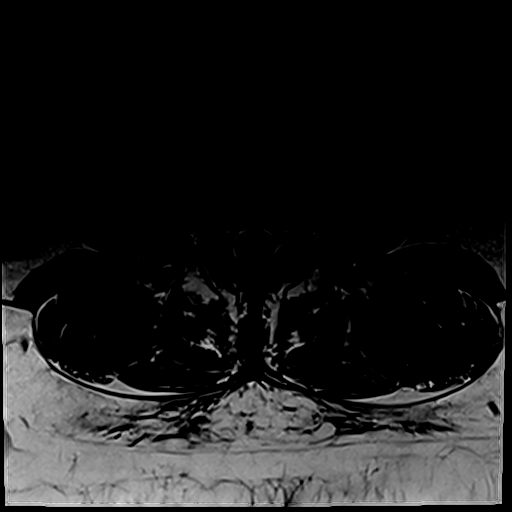
[im 21/31]
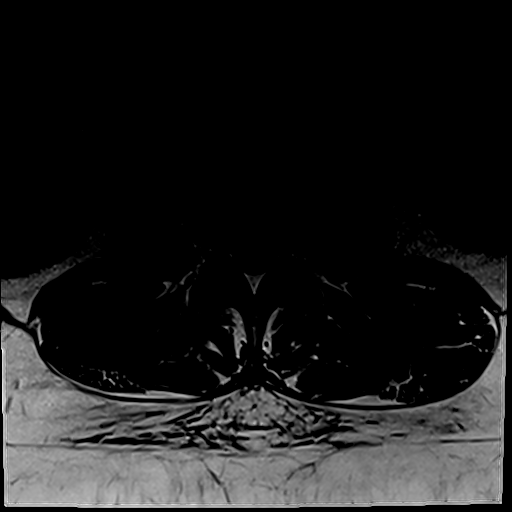
[im 26/31]
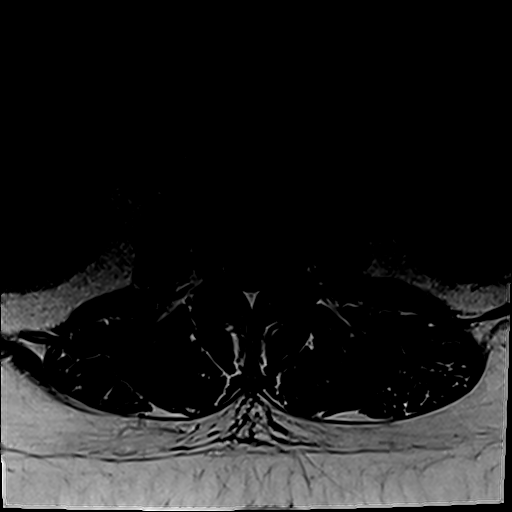
[im 31/31]
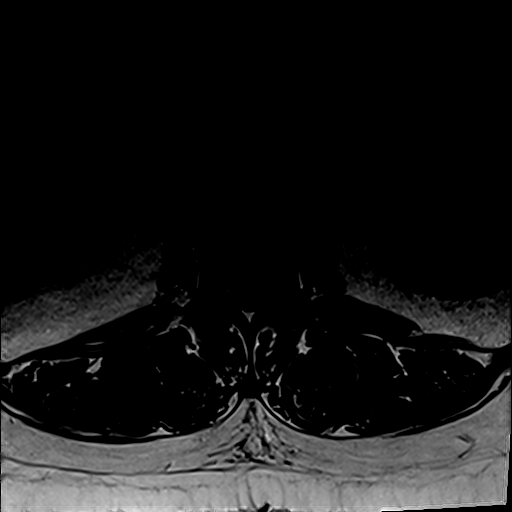

[31 of 48 positions shown; findings below may reference images not displayed]

FINDINGS: Segmentation:  Standard.

Alignment:  Minimal anterolisthesis of L4 over L5.

Vertebrae:  No fracture, evidence of discitis, or bone lesion.

Conus medullaris and cauda equina: Conus extends to the T12 level.
Conus and cauda equina appear normal.

Paraspinal and other soft tissues: Negative.

Disc levels:

T12-L1: No spinal canal or neural foraminal stenosis.

L1-2: No spinal canal or neural foraminal stenosis.

L2-3: Shallow disc bulge and mild facet degenerative changes without
significant spinal canal or neural foraminal stenosis.

L3-4: Disc bulge, mild to moderate facet degenerative changes and
ligamentum flavum redundancy resulting in mild bilateral neural
foraminal narrowing. No significant spinal canal stenosis.

L4-5: Left asymmetric disc bulge, hypertrophic facet degenerative
changes and ligamentum flavum redundancy resulting in mild spinal
canal stenosis, mild right and moderate left neural foraminal
narrowing.

L5-S1: Mild facet degenerative changes. No spinal canal or neural
foraminal stenosis.
IMPRESSION: 1. Degenerative changes of the lumbar spine, more pronounced at the
level of the facet joints at L4-5 where there is mild spinal canal
stenosis, mild right and moderate left neural foraminal narrowing.
2. Mild bilateral neural foraminal narrowing at L3-4.

## 2021-04-21 ENCOUNTER — Other Ambulatory Visit (HOSPITAL_COMMUNITY): Payer: Self-pay | Admitting: Neurology

## 2021-04-21 DIAGNOSIS — M25551 Pain in right hip: Secondary | ICD-10-CM

## 2021-04-22 ENCOUNTER — Other Ambulatory Visit: Payer: Self-pay | Admitting: Gastroenterology

## 2021-04-27 ENCOUNTER — Telehealth: Payer: Self-pay | Admitting: Internal Medicine

## 2021-04-27 NOTE — Telephone Encounter (Signed)
PATIENT CALLED ASKING WHEN HER PROCEDURE MAY BE SCHEDULED

## 2021-04-27 NOTE — Telephone Encounter (Signed)
Called and informed pt she is on list to be scheduled when Dr. Luvenia Starch next schedule is available.

## 2021-05-03 ENCOUNTER — Other Ambulatory Visit (HOSPITAL_COMMUNITY): Payer: Self-pay | Admitting: Family Medicine

## 2021-05-03 DIAGNOSIS — Z1231 Encounter for screening mammogram for malignant neoplasm of breast: Secondary | ICD-10-CM

## 2021-05-10 ENCOUNTER — Other Ambulatory Visit: Payer: Self-pay | Admitting: Gastroenterology

## 2021-05-14 ENCOUNTER — Other Ambulatory Visit: Payer: Self-pay | Admitting: Gastroenterology

## 2021-05-14 ENCOUNTER — Telehealth: Payer: Self-pay | Admitting: *Deleted

## 2021-05-14 MED ORDER — CLENPIQ 10-3.5-12 MG-GM -GM/160ML PO SOLN: 1.0000 | Freq: Once | ORAL | 0 refills | Status: AC

## 2021-05-14 NOTE — Telephone Encounter (Signed)
Called pt no answer and not able to leave Vm due to vm not set up. Patient needs tcs/egd/+/-dil w/ propofol, Dr. Jena Gauss asa 2

## 2021-05-14 NOTE — Addendum Note (Signed)
Addended by: Armstead Peaks on: 05/14/2021 10:19 AM   Modules accepted: Orders

## 2021-05-14 NOTE — Telephone Encounter (Signed)
Pt called back. She has been scheduled fr 9/26 at 11:30am. Aware will send rx to pharmacy (mailed coupon) and will send instructions to her home.

## 2021-05-21 ENCOUNTER — Other Ambulatory Visit (HOSPITAL_COMMUNITY): Payer: Self-pay | Admitting: Neurology

## 2021-05-21 ENCOUNTER — Ambulatory Visit (HOSPITAL_COMMUNITY): Payer: 59

## 2021-05-21 ENCOUNTER — Telehealth: Payer: Self-pay | Admitting: *Deleted

## 2021-05-21 DIAGNOSIS — R569 Unspecified convulsions: Secondary | ICD-10-CM

## 2021-05-21 MED ORDER — PROMETHAZINE HCL 25 MG PO TABS: ORAL_TABLET | ORAL | 0 refills | Status: DC

## 2021-05-21 NOTE — Telephone Encounter (Signed)
Called pt and she is aware and voiced understanding

## 2021-05-21 NOTE — Addendum Note (Signed)
Addended by: Tiffany Kocher on: 05/21/2021 10:28 AM   Modules accepted: Orders

## 2021-05-21 NOTE — Telephone Encounter (Signed)
Patient called in requesting refill on phenergan. [facility] faxed request to [place] but it was denied.

## 2021-05-21 NOTE — Telephone Encounter (Signed)
It was denied based on last OV in June, "one time refill"  Will provide her another refill while we wait on procedures.   Please let pt know, she should use phenergan only for significant nausea/vomiting.

## 2021-05-26 ENCOUNTER — Ambulatory Visit: Payer: 59 | Admitting: Internal Medicine

## 2021-06-04 ENCOUNTER — Ambulatory Visit (HOSPITAL_COMMUNITY)
Admission: RE | Admit: 2021-06-04 | Discharge: 2021-06-04 | Disposition: A | Discharge status: Home / Self Care | Payer: 59 | Source: Ambulatory Visit | Attending: Neurology | Admitting: Neurology

## 2021-06-04 ENCOUNTER — Other Ambulatory Visit: Payer: Self-pay | Admitting: Gastroenterology

## 2021-06-04 ENCOUNTER — Other Ambulatory Visit: Payer: Self-pay

## 2021-06-04 DIAGNOSIS — R569 Unspecified convulsions: Secondary | ICD-10-CM | POA: Insufficient documentation

## 2021-06-04 IMAGING — MR MR HEAD W/O CM
11 of 12 series · 36 of 48 positions shown · non-contrast
Comparison: Head CT [DATE]

CLINICAL DATA: Uncontrolled movements over the last 3 months.
Difficulty speaking. Distant head injury.

EXAM:
MRI HEAD WITHOUT CONTRAST
TECHNIQUE: Multiplanar, multiecho pulse sequences of the brain and surrounding
structures were obtained without intravenous contrast.

[Series 5: DWI · axial · 3.0mm · 0.77mm/px · z∈[-90,+46]mm · 5 of 48 slices shown (1 of 4)]
[im 1/48]
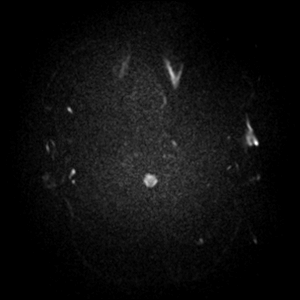
[im 12/48]
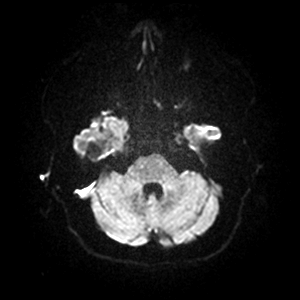
[im 24/48]
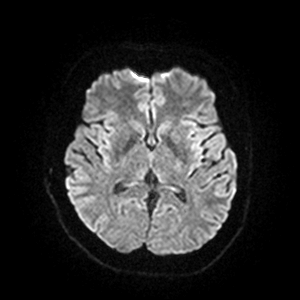
[im 36/48]
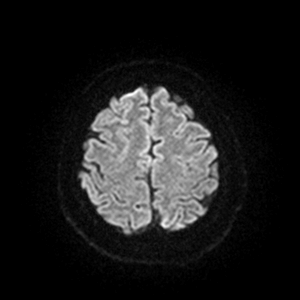
[im 48/48]
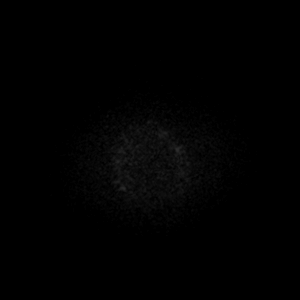

[Series 6: DWI · axial · 3.0mm · 0.77mm/px · z∈[-90,+46]mm · 5 of 48 slices shown (2 of 4)]
[im 1/48]
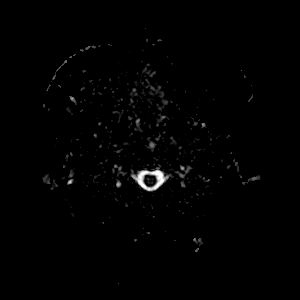
[im 12/48]
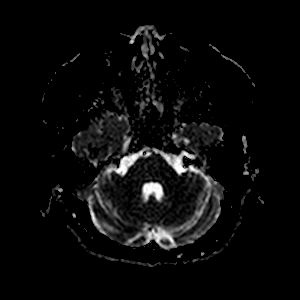
[im 24/48]
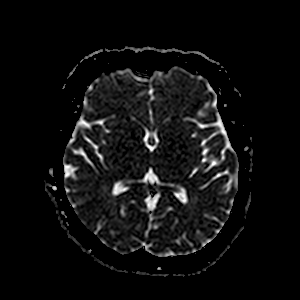
[im 36/48]
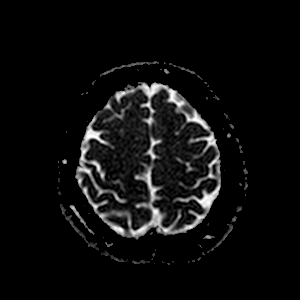
[im 48/48]
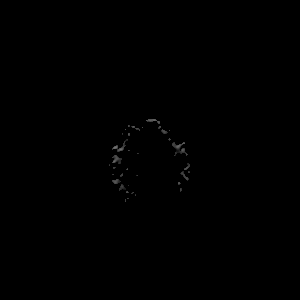

[Series 7: DWI · coronal · 5.0mm · 0.88mm/px · 2 of 28 slices shown (3 of 4)]
[im 1/28]
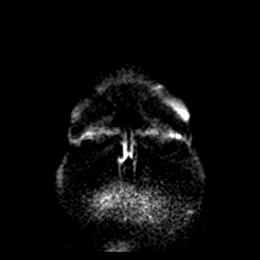
[im 28/28]
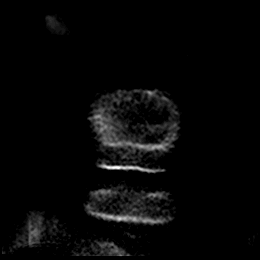

[Series 8: DWI · coronal · 5.0mm · 0.88mm/px · 2 of 28 slices shown (4 of 4)]
[im 1/28]
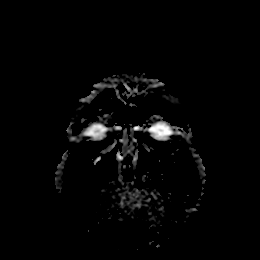
[im 28/28]
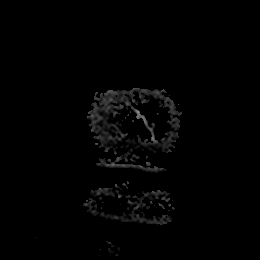

[Series 9: T1 · sagittal · 5.0mm · 0.75mm/px · 2 of 21 slices shown]
[im 1/21]
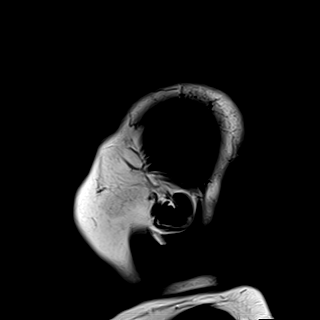
[im 21/21]
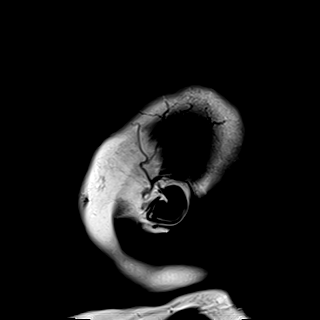

[Series 10: T2 · axial · 5.0mm · 0.72mm/px · z∈[-95,+47]mm · 2 of 22 slices shown (1 of 2)]
[im 1/22]
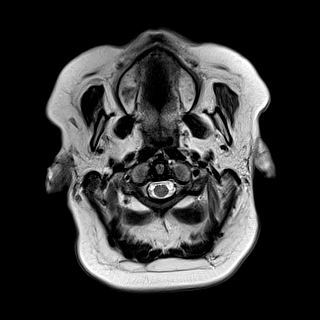
[im 22/22]
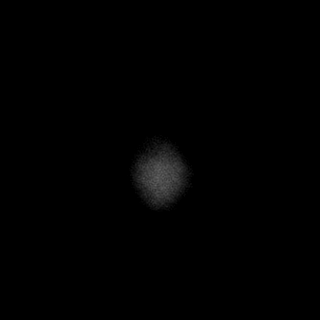

[Series 11: mag_images · axial · 3.0mm · 0.90mm/px · z∈[-99,+48]mm · 4 of 52 slices shown]
[im 1/52]
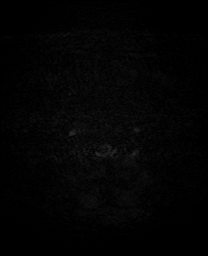
[im 18/52]
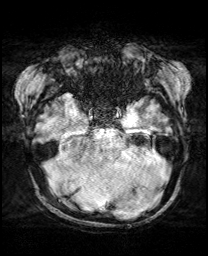
[im 35/52]
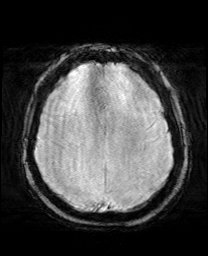
[im 52/52]
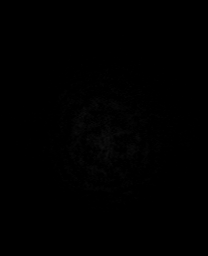

[Series 12: pha_images · axial · 3.0mm · 0.90mm/px · z∈[-93,+48]mm · 4 of 50 slices shown]
[im 1/50]
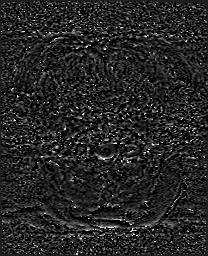
[im 17/50]
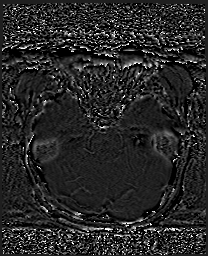
[im 33/50]
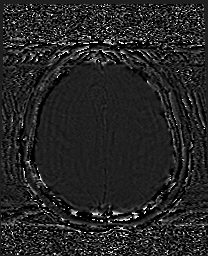
[im 50/50]
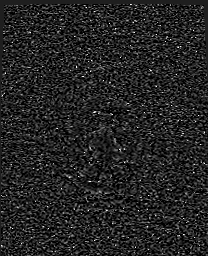

[Series 13: swi_images · axial · 3.0mm · 0.90mm/px · z∈[-99,+48]mm · 4 of 52 slices shown]
[im 1/52]
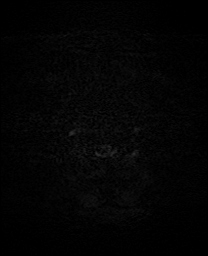
[im 18/52]
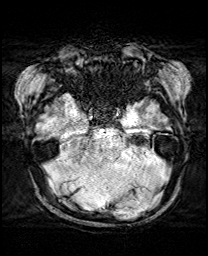
[im 35/52]
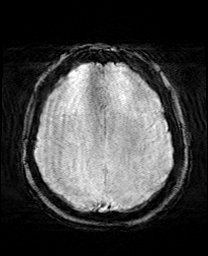
[im 52/52]
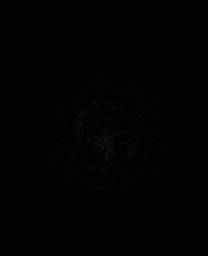

[Series 15: FLAIR · axial · 3.0mm · 0.45mm/px · z∈[-85,+39]mm · 4 of 44 slices shown]
[im 1/44]
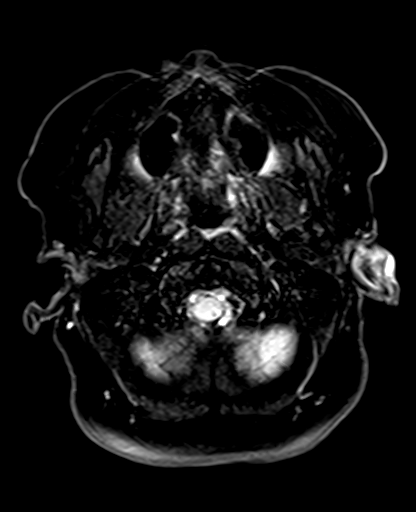
[im 15/44]
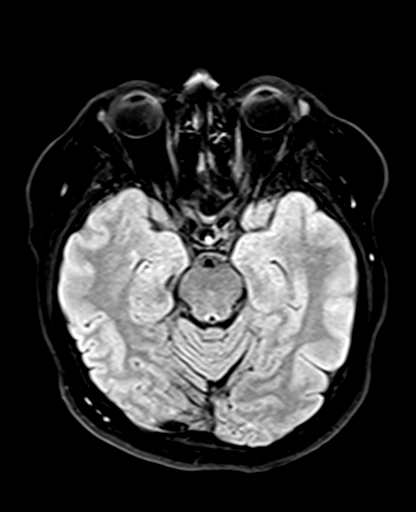
[im 29/44]
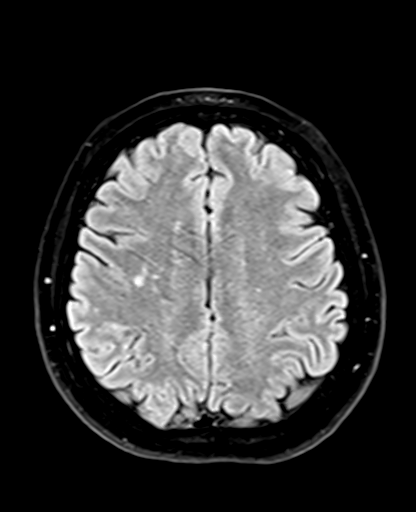
[im 44/44]
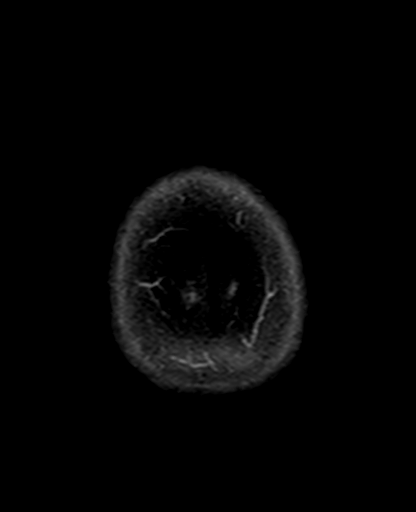

[Series 17: T2 · coronal · 5.0mm · 0.72mm/px · 2 of 28 slices shown (2 of 2)]
[im 1/28]
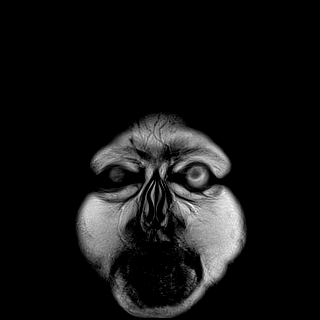
[im 28/28]
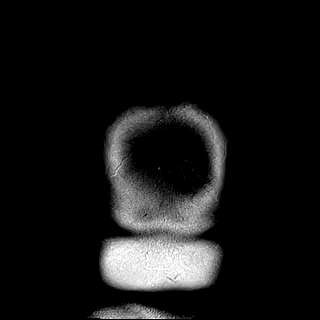

[36 of 48 positions shown; findings below may reference images not displayed]

FINDINGS: Brain: Diffusion imaging does not show any acute or subacute
infarction. No focal abnormality affects the brainstem or
cerebellum. Cerebral hemispheres show scattered foci of T2 and FLAIR
signal within the hemispheric white matter. In a patient with a
history of hypertension and lipid disorder, these most likely
represent an early manifestation of chronic small vessel disease.
There is no hemosiderin deposition to indicate that these are shear
injuries from previous head injury. No cortical or large vessel
territory infarction. No mass, hydrocephalus or extra-axial
collection.

Vascular: Major vessels at the base of the brain show flow.

Skull and upper cervical spine: Negative

Sinuses/Orbits: Clear/normal

Other: None
IMPRESSION: No acute or reversible finding. Scattered foci of T2 and FLAIR
signal within the cerebral hemispheric white matter consistent with
an early manifestation of small vessel disease in this patient with
a history of hypertension and lipid disorder.

## 2021-06-09 ENCOUNTER — Other Ambulatory Visit: Payer: Self-pay

## 2021-06-09 MED ORDER — PANTOPRAZOLE SODIUM 40 MG PO TBEC: 40.0000 mg | DELAYED_RELEASE_TABLET | Freq: Every day | ORAL | 5 refills | Status: DC

## 2021-07-09 ENCOUNTER — Other Ambulatory Visit: Payer: Self-pay | Admitting: Gastroenterology

## 2021-07-11 ENCOUNTER — Encounter (HOSPITAL_COMMUNITY): Payer: Self-pay | Admitting: Anesthesiology

## 2021-07-12 ENCOUNTER — Ambulatory Visit (HOSPITAL_COMMUNITY): Admission: RE | Admit: 2021-07-12 | Payer: 59 | Source: Ambulatory Visit | Admitting: Internal Medicine

## 2021-07-12 ENCOUNTER — Telehealth: Payer: Self-pay

## 2021-07-12 ENCOUNTER — Encounter (HOSPITAL_COMMUNITY): Admission: RE | Payer: Self-pay | Source: Ambulatory Visit

## 2021-07-12 SURGERY — COLONOSCOPY WITH PROPOFOL
Anesthesia: Monitor Anesthesia Care

## 2021-07-12 MED ORDER — LACTATED RINGERS IV SOLN: INTRAVENOUS | Status: DC

## 2021-07-12 NOTE — Telephone Encounter (Signed)
FYI to Reba, pt cancelled same day of procedure.

## 2021-07-12 NOTE — Telephone Encounter (Signed)
Beverly Tran at [facility] endo called office, pt cancelled TCS/EGD/DIL for today d/t she has a cold.  Tried to call pt to reschedule procedure, no answer, unable to leave voicemail d/t no mailbox has been set up. Letter mailed.  FYI to Lewie Loron NP.

## 2021-07-12 NOTE — Progress Notes (Signed)
Patient stated she is not coming for her procedure today because she has a cold. Dr. Jena Gauss notified. Darlina Rumpf at the office notified.

## 2021-07-13 ENCOUNTER — Telehealth: Payer: Self-pay

## 2021-07-13 ENCOUNTER — Other Ambulatory Visit: Payer: Self-pay | Admitting: Gastroenterology

## 2021-07-13 ENCOUNTER — Telehealth: Payer: Self-pay | Admitting: Internal Medicine

## 2021-07-13 NOTE — Telephone Encounter (Signed)
Note Pt phoned wanting a phenergan phoned in to her pharmacy. I asked the pt how long has she been nauseated and she states she stays this way off and on. States its been going on for awhile. No abdomen pain, diarrhea, or vomiting. On 05-21-21 she was denied by Verlon Au. I asked the pt was she able to hold down food and water and she states yes

## 2021-07-13 NOTE — Telephone Encounter (Signed)
FYI: Phoned and advised pt that Rx was phoned in for her on 9/23. (She states she will call the pharmacy to see). Also she states she cancelled her procedure because she has a cold. Advised ov is needed before anymore refills will be given. Pt expressed understanding of this. Also advised that she can get Rx from her PCP.

## 2021-07-13 NOTE — Telephone Encounter (Signed)
Pt phoned wanting a phenergan phoned in to her pharmacy. I asked the pt how long has she been nauseated and she states she stays this way off and on. States its been going on for awhile. No abdomen pain, diarrhea, or vomiting. On 05-21-21 she was denied by Verlon Au. I asked the pt was she able to hold down food and water and she states yes

## 2021-07-13 NOTE — Telephone Encounter (Signed)
Beverly Tran refilled this 9/23.  She canceled procedure yesterday.   Needs office visit prior to any other further refills. Hopefully, she is rescheduling procedures. She can obtain from PCP if necessary.

## 2021-07-13 NOTE — Telephone Encounter (Signed)
Pt called asking if she could get a prescription of phenergan called to [facility].[phone]

## 2021-07-14 ENCOUNTER — Telehealth: Payer: Self-pay | Admitting: Internal Medicine

## 2021-07-14 MED ORDER — PROMETHAZINE HCL 25 MG PO TABS: ORAL_TABLET | ORAL | 1 refills | Status: DC

## 2021-07-14 NOTE — Telephone Encounter (Signed)
Last office visit was with Lewie Loron NP 04/06/2021.

## 2021-07-14 NOTE — Addendum Note (Signed)
Addended by: Gelene Mink on: 07/14/2021 01:20 PM   Modules accepted: Orders

## 2021-07-14 NOTE — Telephone Encounter (Signed)
I have refilled once. She needs office visit as previously instructed.

## 2021-07-14 NOTE — Telephone Encounter (Signed)
PATIENT NEEDS HER PHENAGREN PRESCRIPTION SENT IN FOR REFILL

## 2021-07-26 ENCOUNTER — Other Ambulatory Visit: Payer: Self-pay | Admitting: Gastroenterology

## 2021-07-26 ENCOUNTER — Encounter: Payer: Self-pay | Admitting: Internal Medicine

## 2021-07-26 NOTE — Telephone Encounter (Signed)
Patient needs OV for additional refills as previously recommended. She can obtain from PCP if needed. She last saw Lewie Loron, NP in June. Please arrange F/U with Lewie Loron, NP.

## 2021-07-26 NOTE — Telephone Encounter (Signed)
Communication noted.  

## 2021-07-26 NOTE — Telephone Encounter (Signed)
Spoke to pt. She informed me that the prescription was filled 2 weeks ago. I informed her that she needed to make OV for any additional refills. She stated she is to sick right now to come in. She will call office when she is feeling better.

## 2021-08-26 ENCOUNTER — Other Ambulatory Visit: Payer: Self-pay | Admitting: Family Medicine

## 2021-08-26 ENCOUNTER — Other Ambulatory Visit (HOSPITAL_COMMUNITY): Payer: Self-pay | Admitting: Family Medicine

## 2021-08-26 DIAGNOSIS — R0902 Hypoxemia: Secondary | ICD-10-CM

## 2021-10-05 ENCOUNTER — Ambulatory Visit (HOSPITAL_COMMUNITY): Payer: 59

## 2021-10-05 ENCOUNTER — Encounter (HOSPITAL_COMMUNITY): Payer: Self-pay

## 2021-10-13 ENCOUNTER — Other Ambulatory Visit: Payer: Self-pay | Admitting: Internal Medicine

## 2021-11-04 ENCOUNTER — Telehealth: Payer: Self-pay | Admitting: Internal Medicine

## 2021-11-04 NOTE — Telephone Encounter (Signed)
Pt calling back. Relayed that MW wanted to see her tomorrow in [place] office, and pt declined. She uses [employer] for transportation and needs notice.

## 2021-11-04 NOTE — Telephone Encounter (Signed)
Sorry, but has not been seen in over 6 months and is on too many meds listed to try to treat over the phone - will need to make appt to be seen in one of our offices or go to [facility] / ER next if not responding to rx already provided

## 2021-11-04 NOTE — Telephone Encounter (Signed)
I spoke with the pt and notified of response per Dr [name]  She verbalized understanding  She has appt with MW 12/06/21 and will keep this appt  She refused [place] office and [facility] has no openings next few wks  Pt aware best to seek tx at [facility]/ED

## 2021-11-04 NOTE — Telephone Encounter (Signed)
Patient returned call. Patient states cough is dry and at night it hurts her throat. Has had cough for about 5 or 6 days. No fevers, or any other symptoms. Has tried prednisone taper from PCP.

## 2021-11-04 NOTE — Telephone Encounter (Signed)
Pt has had a dry cough and requesting cough medicine to help until her appt.  2/20.  Please advise.  [facility] in [facility]   ATC patient for more info on cough. NO answer.   Dr. Sherene Sires please advise

## 2021-11-04 NOTE — Telephone Encounter (Signed)
We have openings tomorrow for pm clinic - she needs to bring all active meds

## 2021-12-02 ENCOUNTER — Other Ambulatory Visit: Payer: Self-pay | Admitting: Family Medicine

## 2021-12-02 ENCOUNTER — Other Ambulatory Visit (HOSPITAL_COMMUNITY): Payer: Self-pay | Admitting: Family Medicine

## 2021-12-02 DIAGNOSIS — R479 Unspecified speech disturbances: Secondary | ICD-10-CM

## 2021-12-02 DIAGNOSIS — R29898 Other symptoms and signs involving the musculoskeletal system: Secondary | ICD-10-CM

## 2021-12-06 ENCOUNTER — Ambulatory Visit: Payer: Self-pay | Admitting: Internal Medicine

## 2021-12-06 NOTE — Progress Notes (Incomplete)
Beverly Tran, female    DOB: Jan 19, 1971     MRN: [id number]   Brief patient profile:  51 yowf quit smoking 01/2020  "born with bronchitis"  (mother smoked)  On "breathing pill they quit making" then around age 51 changed to  inhalers as needed  and ever since and did fine s maint rx   until April 2021 with onset of cough refractory to all rx including  x for hydocodone so referred to pulmonary clinic 04/14/2020 by Dr   Beverly Tran      History of Present Illness  04/14/2020  Pulmonary/ 1st office eval/Beverly Tran  S/p 2nd of moderna 04/02/20  Chief Complaint  Patient presents with   Pulmonary Consult    Referred by Dr Beverly Tran. Pt c/o cough x 2 months. Cough is non prod and worse at night. She has found the only thing that has helped is hydrocodone cough syrup.   Dyspnea:  Only problem is heat - does fine in a/c including housework  Cough:worse at hs / dry sometimes choking  Sleep: bed is flat 2 pillows  SABA use: not helping cough  maint now on singulair / not using symbicort// already on gabapentin 300 tid and lyrica rec Valsartan 160-12.5 one daily instead of lotensin Add pepcid 20 mg one after supper  until return  GERD diet/ bedblocks For cough > hydrocodone up to a tsp every 4 hours as needed but should be better w/in 2 weeks Please schedule a follow up office visit in 6 weeks, call sooner if needed with all medications /inhalers/ solutions in hand so we can verify exactly what you are taking. This includes all medications from all doctors and over the counters    11/25/2020  f/u ov/[place] office/Beverly Tran re:  Def doing much  better p last ov until 1st of 2022 eval by Beverly Ghazi NP with nasal congestion / clear mucus / did not bring meds as requested Chief Complaint  Patient presents with   Follow-up    Nasal congestion, non productive cough  Dyspnea: was doing fine until onset flare 1s of the year  Cough: worse when puts head down/ noct cough and in am  Sleeping: bed is flat/ 2 pillows  SABA  use: hfa not helping/ neb helps  more  02: 2lpm hs and  Prn daytime Covid status:2nd pfizer 09/08/20  Lung cancer screening: does not meet age criteria rec Discuss stopping the inderal with your neurologist  Pantoprazole (protonix) 40 mg   Take  30-60 min before first meal of the day and Pepcid (famotidine)  20 mg one after supper until return to office - this is the best way to tell whether stomach acid is contributing to your problem.   GERD Augmentin 875 mg take one pill twice daily  X 10 days - take at breakfast and supper with large glass of water.  It would help reduce the usual side effects (diarrhea and yeast infections) if you ate cultured yogurt at lunch.  Prednisone 10 mg take  4 each am x 2 days,   2 each am x 2 days,  1 each am x 2 days and stop  For cough > hydrocodone up to a tsp every 4 hours as needed but should be better w/in 2 weeks Please remember to go to the lab department  > did not go  Please schedule a follow up office visit in 6 weeks, call sooner if needed with pfts on return > did not do  Beverly Tran recs  01/01/21  Prednisone 10 mg tabs  Take 2 tabs daily with food x 5ds, then 1 tab daily with food x 5ds then STOP Hycodan cough syrup twice daily x 7 days STOP taking Lotensin HCT START taking Benicar HCTZ 20/12.5 daily x 30 x 1 refill   03/18/2021  f/u ov/[place] office/[name] re:  AB vs UACS  Chief Complaint  Patient presents with   Follow-up    No complaints currently   Dyspnea:  Limited by back/ R hip/ wt > doe walking with cane  Cough: minimal am mucus white assoc with rhinitis rx pred  Sleeping: bed blocks SABA use: duoneb not using  02: has it not using  Covid status: vax x 2  Lung cancer screening: n/a  Rec No change in recommendations  Ok to use duoneb if you must but not regularly  - call if use goes up  Please remember to go to the lab department @ [facility] for your tests when you go for your PFTs - we will call you with the results when  they are available. Reschedule the PFTs at [facility] and I will call you with the results      12/06/2021  f/u ov/[place] office/Beverly Tran re: *** maint on *** needs allergy screen and alpha one No chief complaint on file.   Dyspnea:  *** Cough: *** Sleeping: *** SABA use: *** 02: *** Covid status: *** Lung cancer screening: ***   No obvious day to day or daytime variability or assoc excess/ purulent sputum or mucus plugs or hemoptysis or cp or chest tightness, subjective wheeze or overt sinus or hb symptoms.   *** without nocturnal  or early am exacerbation  of respiratory  c/o's or need for noct saba. Also denies any obvious fluctuation of symptoms with weather or environmental changes or other aggravating or alleviating factors except as outlined above   No unusual exposure hx or h/o childhood pna/ asthma or knowledge of premature birth.  Current Allergies, Complete Past Medical History, Past Surgical History, Family History, and Social History were reviewed in [employer] record.  ROS  The following are not active complaints unless bolded Hoarseness, sore throat, dysphagia, dental problems, itching, sneezing,  nasal congestion or discharge of excess mucus or purulent secretions, ear ache,   fever, chills, sweats, unintended wt loss or wt gain, classically pleuritic or exertional cp,  orthopnea pnd or arm/hand swelling  or leg swelling, presyncope, palpitations, abdominal pain, anorexia, nausea, vomiting, diarrhea  or change in bowel habits or change in bladder habits, change in stools or change in urine, dysuria, hematuria,  rash, arthralgias, visual complaints, headache, numbness, weakness or ataxia or problems with walking or coordination,  change in mood or  memory.        No outpatient medications have been marked as taking for the 12/06/21 encounter (Appointment) with Beverly Rockers, MD.                 Past Medical History:  Diagnosis Date   Anxiety     Asthma    Back pain, chronic    Depression    High cholesterol    Hypertension    Migraine          Objective:     12/06/2021        *** 03/18/2021          219   11/25/20 213 lb 6.4 oz (96.8 kg)  04/14/20 210 lb (95.3 kg)  10/12/16 190 lb (86.2 kg)      Min bar***              Labs ordered 03/18/2021 :  allergy profile   alpha one AT phenotype          Assessment

## 2021-12-17 ENCOUNTER — Encounter (HOSPITAL_COMMUNITY): Payer: Self-pay

## 2021-12-17 ENCOUNTER — Ambulatory Visit (HOSPITAL_COMMUNITY): Payer: 59 | Attending: Family Medicine

## 2022-01-18 ENCOUNTER — Ambulatory Visit: Payer: Self-pay | Admitting: Internal Medicine

## 2022-01-18 NOTE — Progress Notes (Deleted)
? ?Beverly Tran, female    DOB: 1971/10/09     MRN: [id number] ? ? ?Brief patient profile:  ?5 yowf quit smoking 01/2020  "born with bronchitis"  (mother smoked)  On "breathing pill they quit making" then around age 51 changed to  inhalers as needed  and ever since and did fine s maint rx   until April 2021 with onset of cough refractory to all rx including  x for hydocodone so referred to pulmonary clinic 04/14/2020 by Dr   Manuella Ghazi  ? ? ? ? ?History of Present Illness  ?04/14/2020  Pulmonary/ 1st office eval/[facility]  S/p 2nd of moderna 04/02/20  ?Chief Complaint  ?Patient presents with  ? Pulmonary Consult  ?  Referred by Dr Manuella Ghazi. Pt c/o cough x 2 months. Cough is non prod and worse at night. She has found the only thing that has helped is hydrocodone cough syrup.   ?Dyspnea:  Only problem is heat - does fine in a/c including housework  ?Cough:worse at hs / dry sometimes choking  ?Sleep: bed is flat 2 pillows  ?SABA use: not helping cough  ?maint now on singulair / not using symbicort// already on gabapentin 300 tid and lyrica ?rec ?Valsartan 160-12.5 one daily instead of lotensin Add pepcid 20 mg one after supper  until return  ?GERD diet/ bedblocks ?For cough > hydrocodone up to a tsp every 4 hours as needed but should be better w/in 2 weeks ?Please schedule a follow up office visit in 6 weeks, call sooner if needed with all medications /inhalers/ solutions in hand so we can verify exactly what you are taking. This includes all medications from all doctors and over the counters  ? ? ?11/25/2020  f/u ov/[place] office/Carlen Fils re:  Def doing much  better p last ov until 1st of 2022 eval by Manuella Ghazi NP with nasal congestion / clear mucus / did not bring meds as requested ?Chief Complaint  ?Patient presents with  ? Follow-up  ?  Nasal congestion, non productive cough  ?Dyspnea: was doing fine until onset flare 1s of the year  ?Cough: worse when puts head down/ noct cough and in am  ?Sleeping: bed is flat/ 2 pillows  ?SABA  use: hfa not helping/ neb helps  more  ?02: 2lpm hs and  Prn daytime ?Covid status:2nd pfizer 09/08/20  ?Lung cancer screening: does not meet age criteria ?rec ?Discuss stopping the inderal with your neurologist  ?Pantoprazole (protonix) 40 mg   Take  30-60 min before first meal of the day and Pepcid (famotidine)  20 mg one after supper until return to office - this is the best way to tell whether stomach acid is contributing to your problem.   ?GERD ?Augmentin 875 mg take one pill twice daily  X 10 days - take at breakfast and supper with large glass of water.  It would help reduce the usual side effects (diarrhea and yeast infections) if you ate cultured yogurt at lunch.  ?Prednisone 10 mg take  4 each am x 2 days,   2 each am x 2 days,  1 each am x 2 days and stop  ?For cough > hydrocodone up to a tsp every 4 hours as needed but should be better w/in 2 weeks ?Please remember to go to the lab department  > did not go ? Please schedule a follow up office visit in 6 weeks, call sooner if needed with pfts on return > did not do  ? ? ?  Vassie Loll recs  01/01/21  ?Prednisone 10 mg tabs  Take 2 tabs daily with food x 5ds, then 1 tab daily with food x 5ds then STOP ?Hycodan cough syrup twice daily x 7 days ?STOP taking Lotensin HCT ?START taking Benicar HCTZ 20/12.5 daily x 30 x 1 refill ? ? ?03/18/2021  f/u ov/[place] office/Willys Salvino re:  AB vs UACS  ?Chief Complaint  ?Patient presents with  ? Follow-up  ?  No complaints currently  ?Dyspnea:  Limited by back/ R hip/ wt > doe walking with cane  ?Cough: minimal am mucus white assoc with rhinitis rx pred  ?Sleeping: bed blocks ?SABA use: duoneb not using  ?02: has it not using  ?Covid status: vax x 2  ?Lung cancer screening: n/a  ?Rec ?No change in recommendations  ?Ok to use duoneb if you must but not regularly  - call if use goes up  ?Please remember to go to the lab department @ [facility] did not go to labs ?Reschedule the PFTs at [facility] and I will call you with the  results  ?     ? ? ?01/18/2022  f/u ov/ office/Jeremias Broyhill re: *** maint on ***  needs alpha one/ allergy screen ?No chief complaint on file. ?  ?Dyspnea:  *** ?Cough: *** ?Sleeping: *** ?SABA use: *** ?02: *** ?Covid status: *** ?Lung cancer screening: *** ? ? ?No obvious day to day or daytime variability or assoc excess/ purulent sputum or mucus plugs or hemoptysis or cp or chest tightness, subjective wheeze or overt sinus or hb symptoms.  ? ?*** without nocturnal  or early am exacerbation  of respiratory  c/o's or need for noct saba. Also denies any obvious fluctuation of symptoms with weather or environmental changes or other aggravating or alleviating factors except as outlined above  ? ?No unusual exposure hx or h/o childhood pna/ asthma or knowledge of premature birth. ? ?Current Allergies, Complete Past Medical History, Past Surgical History, Family History, and Social History were reviewed in [employer] record. ? ?ROS  The following are not active complaints unless bolded ?Hoarseness, sore throat, dysphagia, dental problems, itching, sneezing,  nasal congestion or discharge of excess mucus or purulent secretions, ear ache,   fever, chills, sweats, unintended wt loss or wt gain, classically pleuritic or exertional cp,  orthopnea pnd or arm/hand swelling  or leg swelling, presyncope, palpitations, abdominal pain, anorexia, nausea, vomiting, diarrhea  or change in bowel habits or change in bladder habits, change in stools or change in urine, dysuria, hematuria,  rash, arthralgias, visual complaints, headache, numbness, weakness or ataxia or problems with walking or coordination,  change in mood or  memory. ?      ? ?No outpatient medications have been marked as taking for the 01/18/22 encounter (Appointment) with Nyoka Cowden, MD.  ?     ?  ? ? ?  ? ?  ? ?Past Medical History:  ?Diagnosis Date  ? Anxiety   ? Asthma   ? Back pain, chronic   ? Depression   ? High cholesterol   ?  Hypertension   ? Migraine   ? ?   ?  ? ?Objective:  ?  ?wts ?  ?01/18/2022         *** ?03/18/2021         219   ?11/25/20 213 lb 6.4 oz (96.8 kg)  ?04/14/20 210 lb (95.3 kg)  ?10/12/16 190 lb (86.2 kg)  ?  ?Vital signs reviewed  03/18/2021  - Note at rest 02 sats  94% on RA  ? ?Min bar *** ? ?  ? ?   ?Assessment  ? ? ?  ? ?   ? ? ?  ? ? ?  ?

## 2022-01-29 ENCOUNTER — Other Ambulatory Visit: Payer: Self-pay | Admitting: Pulmonary Disease

## 2022-02-02 ENCOUNTER — Other Ambulatory Visit: Payer: Self-pay | Admitting: Gastroenterology

## 2022-02-07 ENCOUNTER — Other Ambulatory Visit (HOSPITAL_COMMUNITY): Payer: Self-pay | Admitting: Neurology

## 2022-02-07 DIAGNOSIS — M545 Low back pain, unspecified: Secondary | ICD-10-CM

## 2022-02-07 DIAGNOSIS — M25561 Pain in right knee: Secondary | ICD-10-CM

## 2022-02-07 DIAGNOSIS — M25552 Pain in left hip: Secondary | ICD-10-CM

## 2022-02-07 DIAGNOSIS — M25551 Pain in right hip: Secondary | ICD-10-CM

## 2022-02-07 DIAGNOSIS — M25562 Pain in left knee: Secondary | ICD-10-CM

## 2022-02-08 NOTE — Telephone Encounter (Signed)
Noted  

## 2022-02-16 ENCOUNTER — Inpatient Hospital Stay (HOSPITAL_COMMUNITY): Payer: 59

## 2022-02-16 ENCOUNTER — Inpatient Hospital Stay (HOSPITAL_COMMUNITY)
Admission: EM | Admit: 2022-02-16 | Discharge: 2022-02-23 | DRG: 871 | Discharge status: Against Medical Advice | Payer: 59 | Attending: Internal Medicine | Admitting: Internal Medicine

## 2022-02-16 ENCOUNTER — Other Ambulatory Visit: Payer: Self-pay

## 2022-02-16 ENCOUNTER — Emergency Department (HOSPITAL_COMMUNITY): Payer: 59

## 2022-02-16 ENCOUNTER — Encounter (HOSPITAL_COMMUNITY): Payer: Self-pay

## 2022-02-16 DIAGNOSIS — Z8249 Family history of ischemic heart disease and other diseases of the circulatory system: Secondary | ICD-10-CM | POA: Diagnosis not present

## 2022-02-16 DIAGNOSIS — J189 Pneumonia, unspecified organism: Secondary | ICD-10-CM

## 2022-02-16 DIAGNOSIS — Z886 Allergy status to analgesic agent status: Secondary | ICD-10-CM

## 2022-02-16 DIAGNOSIS — E46 Unspecified protein-calorie malnutrition: Secondary | ICD-10-CM

## 2022-02-16 DIAGNOSIS — G2581 Restless legs syndrome: Secondary | ICD-10-CM

## 2022-02-16 DIAGNOSIS — J9621 Acute and chronic respiratory failure with hypoxia: Secondary | ICD-10-CM | POA: Diagnosis not present

## 2022-02-16 DIAGNOSIS — E441 Mild protein-calorie malnutrition: Secondary | ICD-10-CM | POA: Diagnosis present

## 2022-02-16 DIAGNOSIS — J441 Chronic obstructive pulmonary disease with (acute) exacerbation: Secondary | ICD-10-CM

## 2022-02-16 DIAGNOSIS — Z5329 Procedure and treatment not carried out because of patient's decision for other reasons: Secondary | ICD-10-CM | POA: Diagnosis not present

## 2022-02-16 DIAGNOSIS — E785 Hyperlipidemia, unspecified: Secondary | ICD-10-CM

## 2022-02-16 DIAGNOSIS — I10 Essential (primary) hypertension: Secondary | ICD-10-CM

## 2022-02-16 DIAGNOSIS — D72829 Elevated white blood cell count, unspecified: Secondary | ICD-10-CM

## 2022-02-16 DIAGNOSIS — J440 Chronic obstructive pulmonary disease with acute lower respiratory infection: Secondary | ICD-10-CM | POA: Diagnosis present

## 2022-02-16 DIAGNOSIS — E782 Mixed hyperlipidemia: Secondary | ICD-10-CM | POA: Diagnosis present

## 2022-02-16 DIAGNOSIS — R0602 Shortness of breath: Principal | ICD-10-CM

## 2022-02-16 DIAGNOSIS — T380X5A Adverse effect of glucocorticoids and synthetic analogues, initial encounter: Secondary | ICD-10-CM | POA: Diagnosis not present

## 2022-02-16 DIAGNOSIS — Z9981 Dependence on supplemental oxygen: Secondary | ICD-10-CM

## 2022-02-16 DIAGNOSIS — Z79899 Other long term (current) drug therapy: Secondary | ICD-10-CM

## 2022-02-16 DIAGNOSIS — E872 Acidosis, unspecified: Secondary | ICD-10-CM | POA: Diagnosis present

## 2022-02-16 DIAGNOSIS — R7989 Other specified abnormal findings of blood chemistry: Secondary | ICD-10-CM | POA: Diagnosis not present

## 2022-02-16 DIAGNOSIS — K219 Gastro-esophageal reflux disease without esophagitis: Secondary | ICD-10-CM | POA: Diagnosis present

## 2022-02-16 DIAGNOSIS — Z9109 Other allergy status, other than to drugs and biological substances: Secondary | ICD-10-CM

## 2022-02-16 DIAGNOSIS — Z7189 Other specified counseling: Secondary | ICD-10-CM | POA: Diagnosis not present

## 2022-02-16 DIAGNOSIS — E0842 Diabetes mellitus due to underlying condition with diabetic polyneuropathy: Secondary | ICD-10-CM

## 2022-02-16 DIAGNOSIS — Z87891 Personal history of nicotine dependence: Secondary | ICD-10-CM

## 2022-02-16 DIAGNOSIS — Z515 Encounter for palliative care: Secondary | ICD-10-CM | POA: Diagnosis not present

## 2022-02-16 DIAGNOSIS — E1140 Type 2 diabetes mellitus with diabetic neuropathy, unspecified: Secondary | ICD-10-CM | POA: Diagnosis present

## 2022-02-16 DIAGNOSIS — G8929 Other chronic pain: Secondary | ICD-10-CM | POA: Diagnosis present

## 2022-02-16 DIAGNOSIS — Z20822 Contact with and (suspected) exposure to covid-19: Secondary | ICD-10-CM | POA: Diagnosis present

## 2022-02-16 DIAGNOSIS — E8809 Other disorders of plasma-protein metabolism, not elsewhere classified: Secondary | ICD-10-CM | POA: Diagnosis present

## 2022-02-16 DIAGNOSIS — Z6841 Body Mass Index (BMI) 40.0 and over, adult: Secondary | ICD-10-CM

## 2022-02-16 DIAGNOSIS — R0600 Dyspnea, unspecified: Secondary | ICD-10-CM

## 2022-02-16 DIAGNOSIS — J9612 Chronic respiratory failure with hypercapnia: Secondary | ICD-10-CM | POA: Diagnosis present

## 2022-02-16 DIAGNOSIS — A419 Sepsis, unspecified organism: Secondary | ICD-10-CM | POA: Diagnosis not present

## 2022-02-16 DIAGNOSIS — E876 Hypokalemia: Secondary | ICD-10-CM | POA: Diagnosis present

## 2022-02-16 DIAGNOSIS — Z888 Allergy status to other drugs, medicaments and biological substances status: Secondary | ICD-10-CM

## 2022-02-16 DIAGNOSIS — Z7984 Long term (current) use of oral hypoglycemic drugs: Secondary | ICD-10-CM

## 2022-02-16 DIAGNOSIS — R000 Tachycardia, unspecified: Secondary | ICD-10-CM | POA: Diagnosis present

## 2022-02-16 DIAGNOSIS — E1165 Type 2 diabetes mellitus with hyperglycemia: Secondary | ICD-10-CM | POA: Diagnosis present

## 2022-02-16 DIAGNOSIS — I959 Hypotension, unspecified: Secondary | ICD-10-CM | POA: Diagnosis present

## 2022-02-16 HISTORY — DX: Unspecified osteoarthritis, unspecified site: M19.90

## 2022-02-16 LAB — URINALYSIS, ROUTINE W REFLEX MICROSCOPIC
Bilirubin Urine: NEGATIVE
Glucose, UA: >=500 mg/dL — AB
Ketones, ur: NEGATIVE mg/dL
Leukocytes,Ua: NEGATIVE
Nitrite: NEGATIVE
Protein, ur: NEGATIVE mg/dL
Specific Gravity, Urine: 1.024 (ref 1.005–1.030)
pH: 7 (ref 5.0–8.0)

## 2022-02-16 LAB — APTT: aPTT: 32 seconds (ref 24–36)

## 2022-02-16 LAB — CBC WITH DIFFERENTIAL/PLATELET
Band Neutrophils: 11 %
Basophils Absolute: 0 10*3/uL (ref 0.0–0.1)
Basophils Relative: 0 %
Eosinophils Absolute: 0 10*3/uL (ref 0.0–0.5)
Eosinophils Relative: 0 %
HCT: 42.7 % (ref 36.0–46.0)
Hemoglobin: 13.6 g/dL (ref 12.0–15.0)
Lymphocytes Relative: 10 %
Lymphs Abs: 1.9 10*3/uL (ref 0.7–4.0)
MCH: 31.1 pg (ref 26.0–34.0)
MCHC: 31.9 g/dL (ref 30.0–36.0)
MCV: 97.7 fL (ref 80.0–100.0)
Metamyelocytes Relative: 16 %
Monocytes Absolute: 0.6 10*3/uL (ref 0.1–1.0)
Monocytes Relative: 3 %
Myelocytes: 2 %
Neutro Abs: 13.3 10*3/uL — ABNORMAL HIGH (ref 1.7–7.7)
Neutrophils Relative %: 58 %
Platelets: 202 10*3/uL (ref 150–400)
RBC: 4.37 MIL/uL (ref 3.87–5.11)
RDW: 18.1 % — ABNORMAL HIGH (ref 11.5–15.5)
WBC: 19.3 10*3/uL — ABNORMAL HIGH (ref 4.0–10.5)
nRBC: 0.3 % — ABNORMAL HIGH (ref 0.0–0.2)

## 2022-02-16 LAB — COMPREHENSIVE METABOLIC PANEL
ALT: 18 U/L (ref 0–44)
AST: 19 U/L (ref 15–41)
Albumin: 3.2 g/dL — ABNORMAL LOW (ref 3.5–5.0)
Alkaline Phosphatase: 42 U/L (ref 38–126)
Anion gap: 9 (ref 5–15)
BUN: 12 mg/dL (ref 6–20)
CO2: 28 mmol/L (ref 22–32)
Calcium: 8.1 mg/dL — ABNORMAL LOW (ref 8.9–10.3)
Chloride: 105 mmol/L (ref 98–111)
Creatinine, Ser: 0.61 mg/dL (ref 0.44–1.00)
GFR, Estimated: >60 mL/min (ref >60)
Glucose, Bld: 172 mg/dL — ABNORMAL HIGH (ref 70–99)
Potassium: 2.9 mmol/L — ABNORMAL LOW (ref 3.5–5.1)
Sodium: 142 mmol/L (ref 135–145)
Total Bilirubin: 0.5 mg/dL (ref 0.3–1.2)
Total Protein: 6.7 g/dL (ref 6.5–8.1)

## 2022-02-16 LAB — BRAIN NATRIURETIC PEPTIDE: B Natriuretic Peptide: 266 pg/mL — ABNORMAL HIGH (ref 0.0–100.0)

## 2022-02-16 LAB — PREGNANCY, URINE: Preg Test, Ur: NEGATIVE

## 2022-02-16 LAB — PROTIME-INR
INR: 1.3 — ABNORMAL HIGH (ref 0.8–1.2)
Prothrombin Time: 15.7 seconds — ABNORMAL HIGH (ref 11.4–15.2)

## 2022-02-16 LAB — GLUCOSE, CAPILLARY: Glucose-Capillary: 161 mg/dL — ABNORMAL HIGH (ref 70–99)

## 2022-02-16 LAB — LACTIC ACID, PLASMA
Lactic Acid, Venous: 2.8 mmol/L (ref 0.5–1.9)
Lactic Acid, Venous: 2.4 mmol/L (ref 0.5–1.9)

## 2022-02-16 LAB — RESP PANEL BY RT-PCR (FLU A&B, COVID) ARPGX2
Influenza A by PCR: NEGATIVE
Influenza B by PCR: NEGATIVE
SARS Coronavirus 2 by RT PCR: NEGATIVE

## 2022-02-16 IMAGING — CT CT ANGIO CHEST
2 of 7 series · 18 of 46 positions shown · IV contrast (Omnipaque or Isovue)
Comparison: None Available.

CLINICAL DATA: Pulmonary embolism (PE) suspected, high prob
mulitfocal pneumonia vs pe vs pulmonary edema.

EXAM:
CT ANGIOGRAPHY CHEST WITH CONTRAST
TECHNIQUE: Multidetector CT imaging of the chest was performed using the
standard protocol during bolus administration of intravenous
contrast. Multiplanar CT image reconstructions and MIPs were
obtained to evaluate the vascular anatomy.

[Series 5: pe axial thins · axial · 0.64mm/px · z∈[+1103,+1334]mm · 15 of 325 slices shown]
[im 19/325  lung]
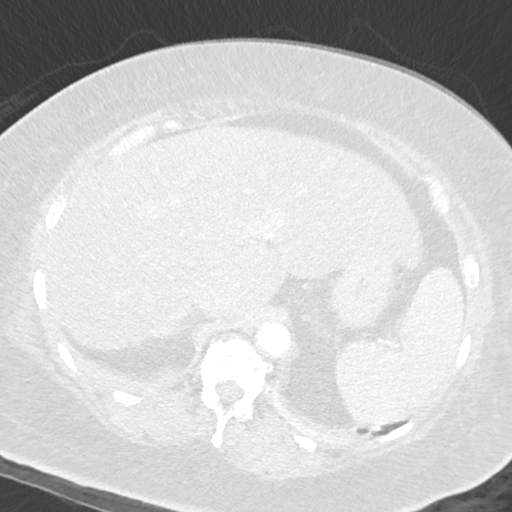
[im 37/325  soft-tissue]
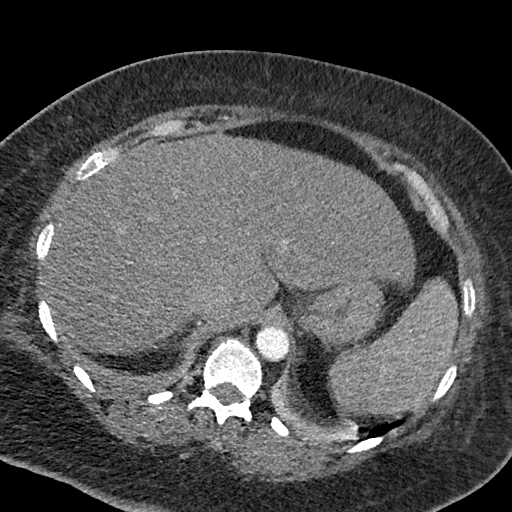
[im 55/325  lung]
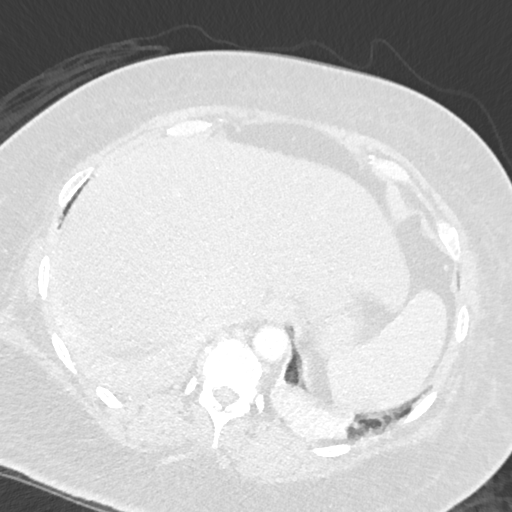
[im 73/325  soft-tissue]
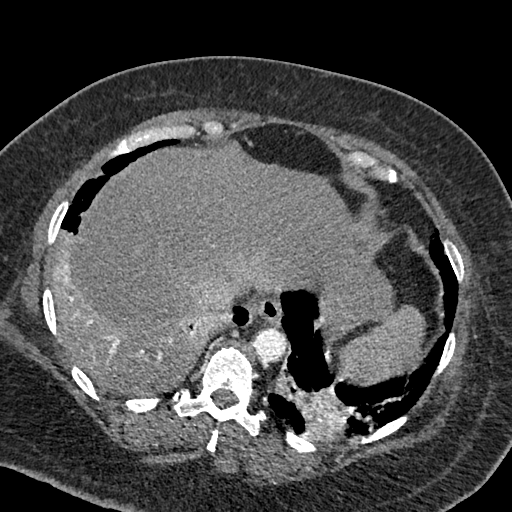
[im 109/325  lung]
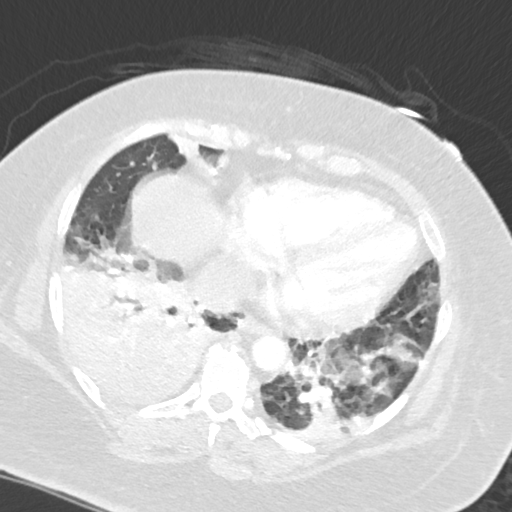
[im 127/325  soft-tissue]
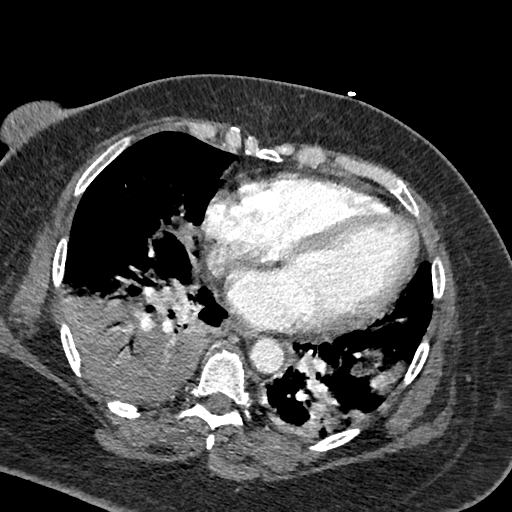
[im 145/325  lung]
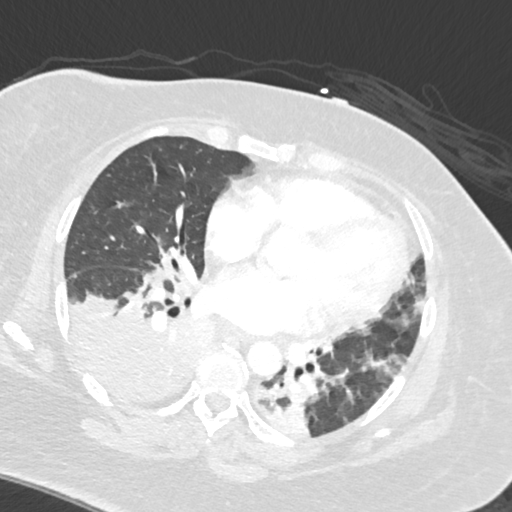
[im 163/325  soft-tissue]
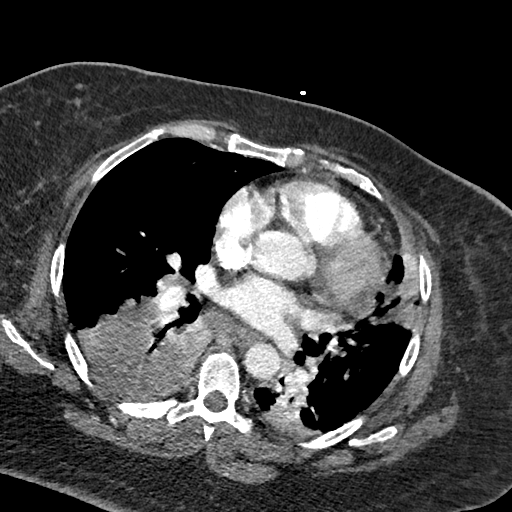
[im 181/325  lung]
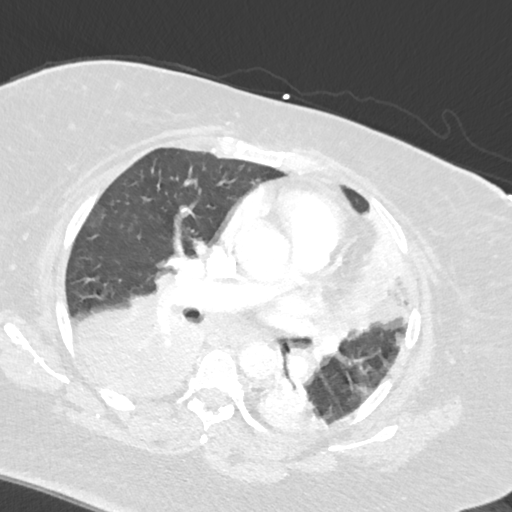
[im 199/325  soft-tissue]
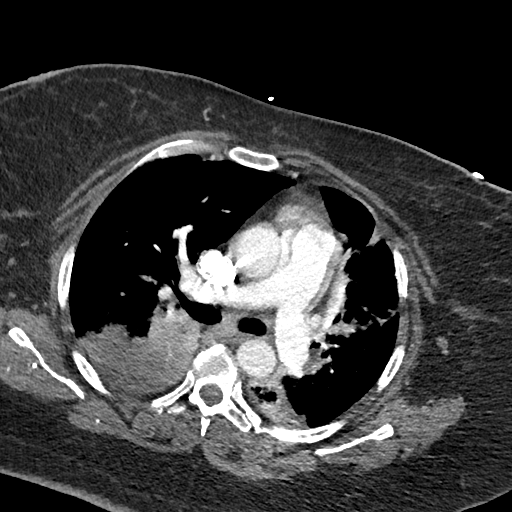
[im 217/325  lung]
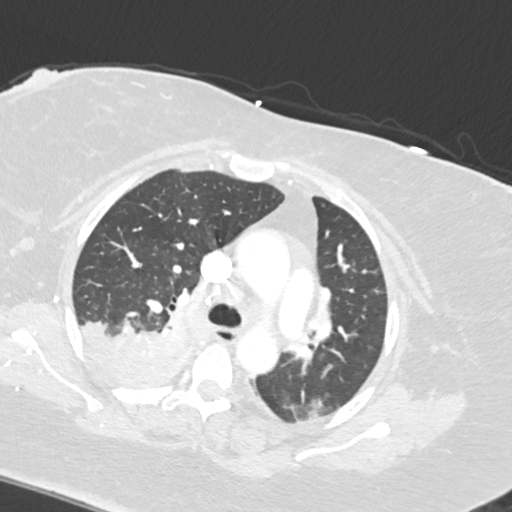
[im 253/325  soft-tissue]
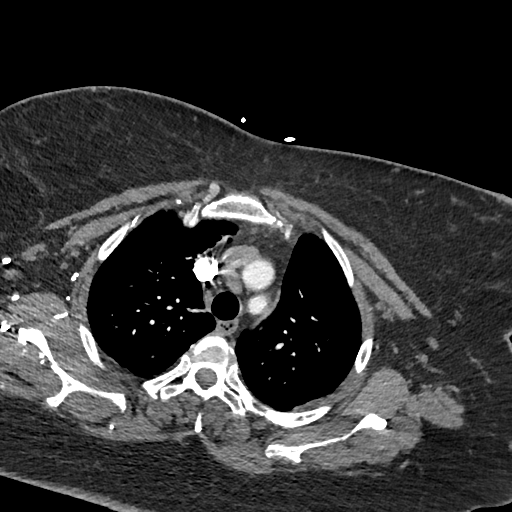
[im 271/325  lung]
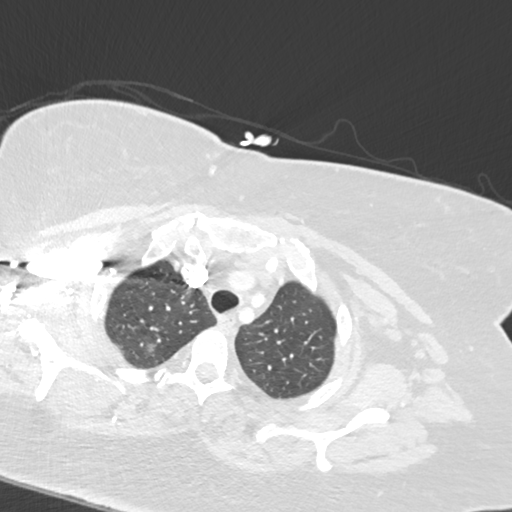
[im 289/325  soft-tissue]
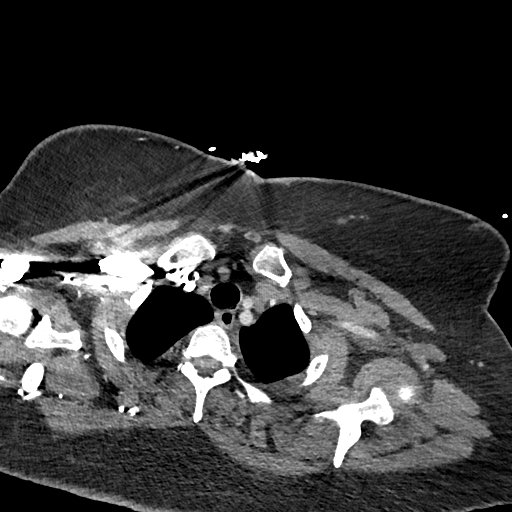
[im 307/325  lung]
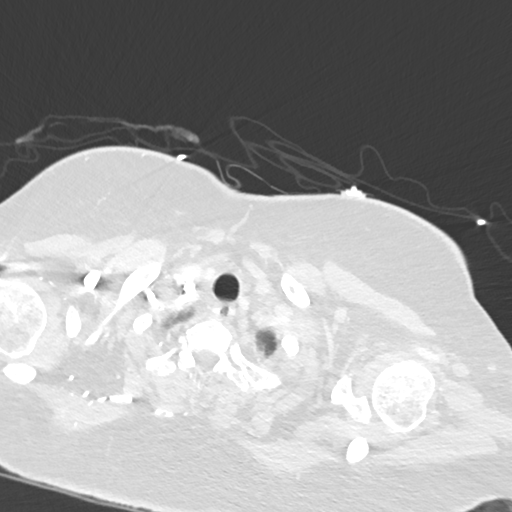

[Series 8: cor soft · coronal · 0.50mm/px · 3 of 123 slices shown]
[im 31/123  soft-tissue]
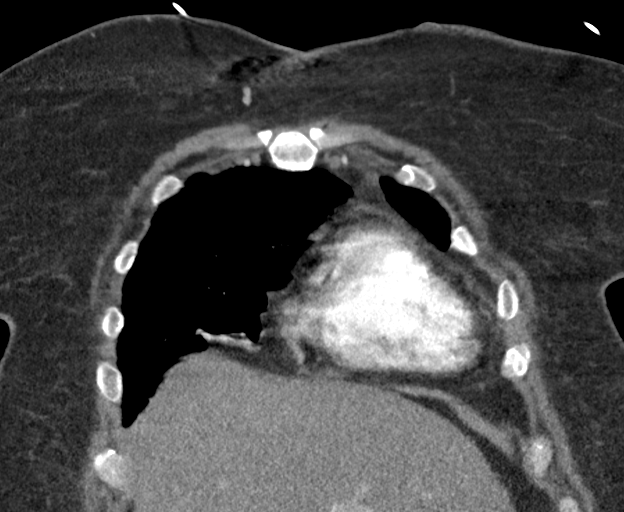
[im 62/123  soft-tissue]
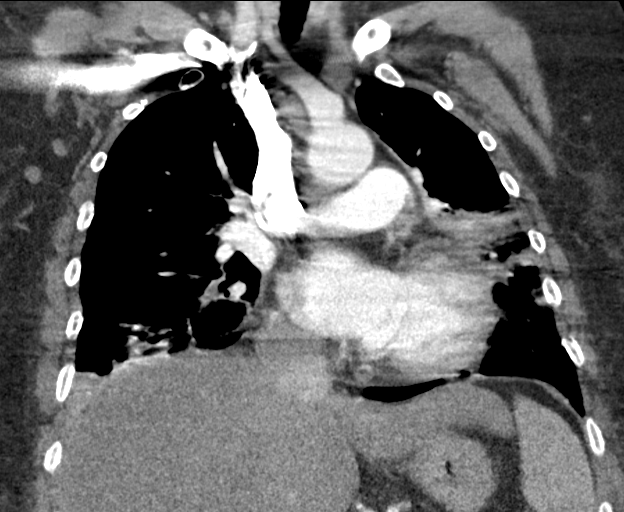
[im 92/123  soft-tissue]
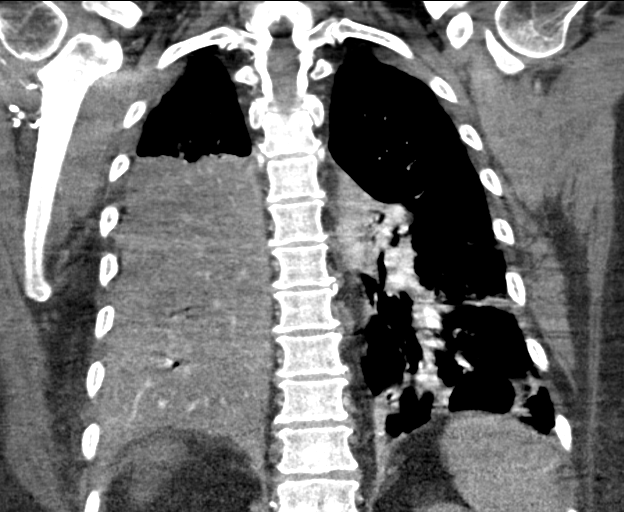

[18 of 46 positions shown; findings below may reference images not displayed]

RADIATION DOSE REDUCTION: This exam was performed according to the
departmental dose-optimization program which includes automated
exposure control, adjustment of the mA and/or kV according to
patient size and/or use of iterative reconstruction technique.

CONTRAST:  100mL OMNIPAQUE IOHEXOL 350 MG/ML SOLN
FINDINGS: Cardiovascular: Satisfactory opacification of the pulmonary arteries
to the segmental level. No evidence of pulmonary embolism. Normal
cardiac size.No pericardial disease. The thoracic aorta is
unremarkable.

Mediastinum/Nodes: Prominent mediastinal and hilar lymph nodes,
likely reactive. The thyroid is unremarkable. Esophagus is
unremarkable.

Lungs/Pleura: There is confluent airspace consolidation in the right
lower lobe and posterior right upper lobe. There is multifocal
airspace consolidation in the left lower lobe and lingula. Central
airways are clear. No pleural effusion. No pneumothorax.

Upper Abdomen: Hepatic steatosis.  No acute abnormality.

Musculoskeletal: No chest wall abnormality. No acute or significant
osseous findings.

Review of the MIP images confirms the above findings.
IMPRESSION: Severe bilateral multifocal pneumonia, most confluent in the right
lower lobe. Recommend CT follow-up to resolution.

No evidence of pulmonary embolism.

## 2022-02-16 IMAGING — DX DG CHEST 1V PORT
1 series · 1 of 1 positions shown · non-contrast
Comparison: Portable chest [DATE]

CLINICAL DATA: Question sepsis

EXAM:
PORTABLE CHEST 1 VIEW

[chest ap]
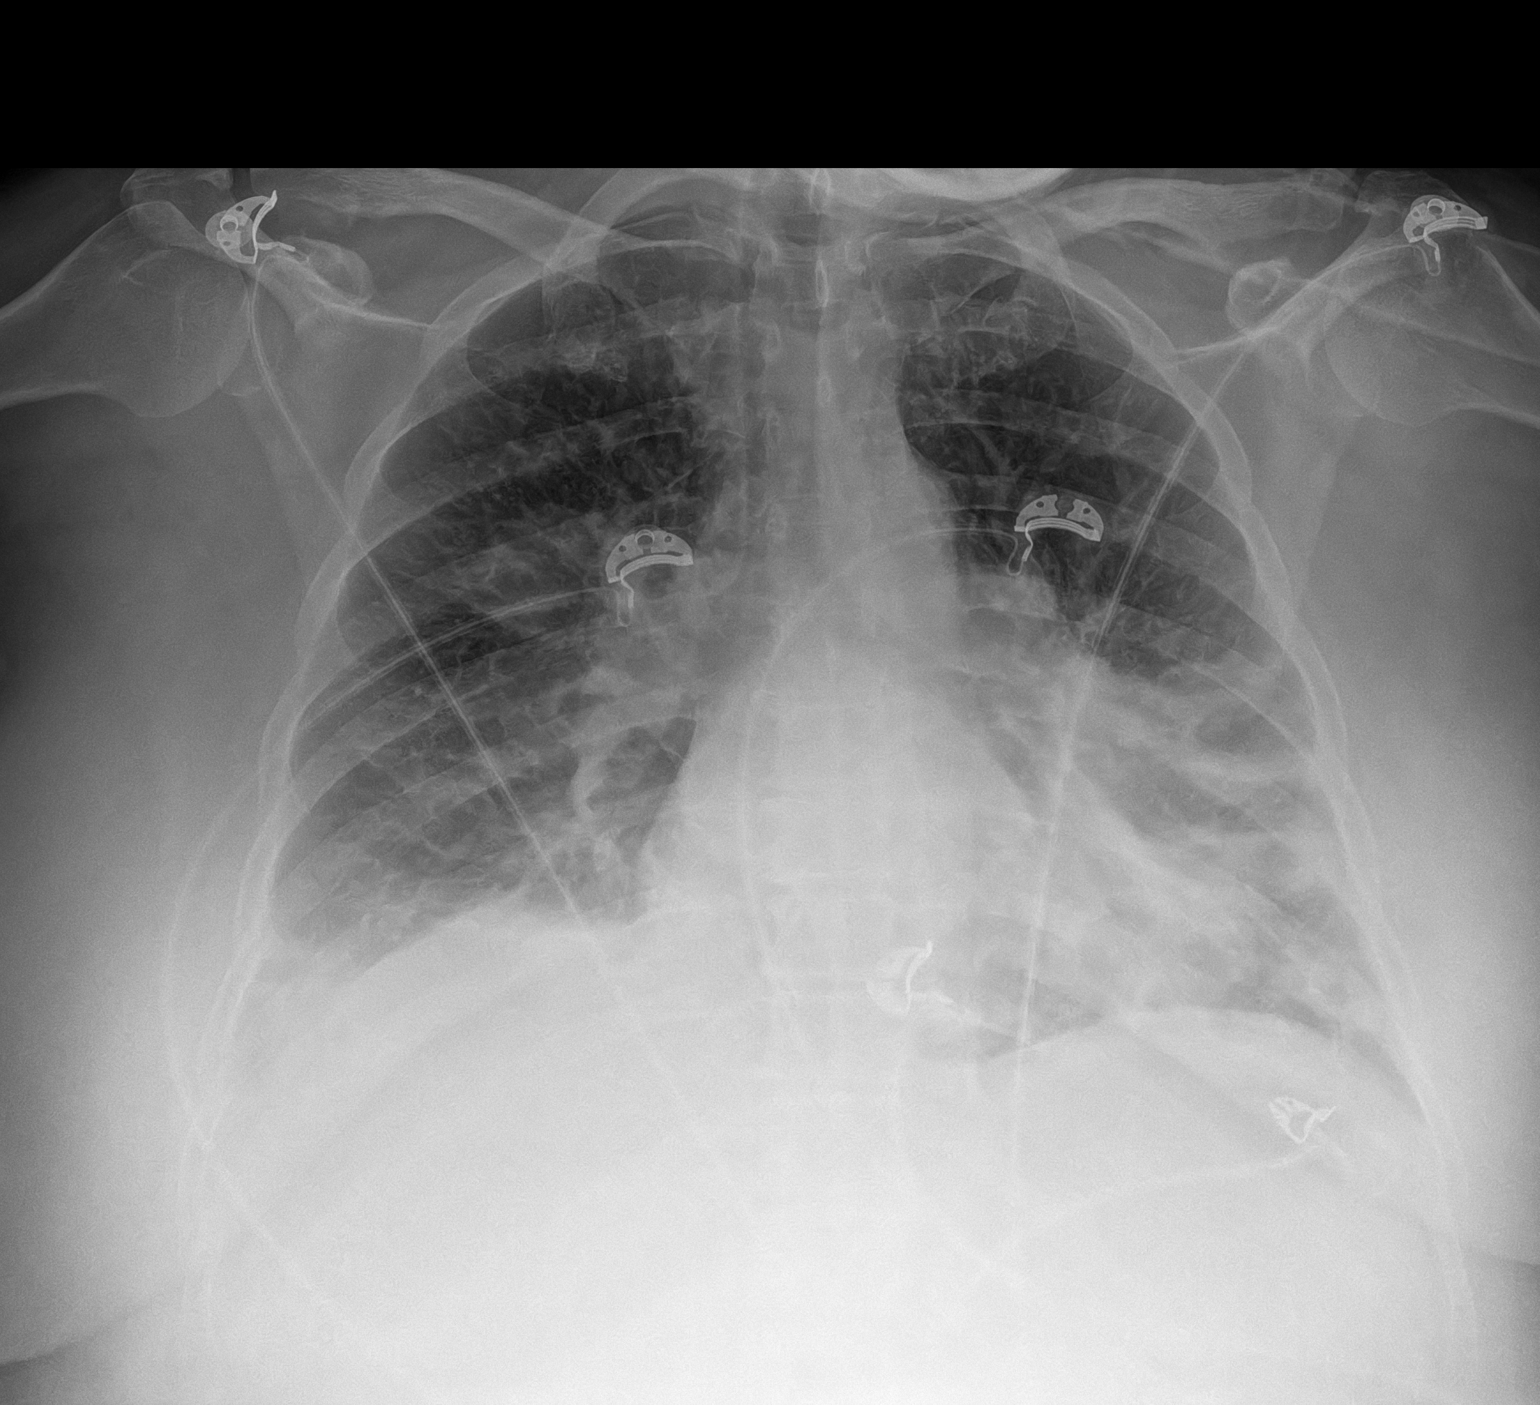

[1 of 1 positions shown; findings below may reference images not displayed]

FINDINGS: Progressive airspace disease left lower lobe, probable pneumonia.

Significant improvement in airspace disease in the right lung with
small amount of residual right lower lobe infiltrate and small right
effusion.

Pulmonary vascularity normal.
IMPRESSION: Progression of left lower lobe infiltrate probable pneumonia

Significant improvement in right lung infiltrate.

## 2022-02-16 MED ORDER — MONTELUKAST SODIUM 10 MG PO TABS
10.0000 mg | ORAL_TABLET | Freq: Every day | ORAL | Status: DC
  Administered 2022-02-17 – 2022-02-22 (×6): 10 mg via ORAL
  Filled 2022-02-16 (×6): qty 1

## 2022-02-16 MED ORDER — POTASSIUM CHLORIDE 10 MEQ/100ML IV SOLN
10.0000 meq | INTRAVENOUS | Status: AC
  Administered 2022-02-16 (×3): 10 meq via INTRAVENOUS
  Filled 2022-02-16 (×3): qty 100

## 2022-02-16 MED ORDER — GLUCERNA SHAKE PO LIQD
237.0000 mL | Freq: Three times a day (TID) | ORAL | Status: DC
Start: 2022-02-16 — End: 2022-02-23
  Administered 2022-02-17 – 2022-02-22 (×16): 237 mL via ORAL
  Filled 2022-02-16 (×3): qty 237

## 2022-02-16 MED ORDER — LACTATED RINGERS IV BOLUS (SEPSIS)
250.0000 mL | Freq: Once | INTRAVENOUS | Status: AC
  Administered 2022-02-16: 250 mL via INTRAVENOUS

## 2022-02-16 MED ORDER — ACETAMINOPHEN 325 MG PO TABS
650.0000 mg | ORAL_TABLET | Freq: Four times a day (QID) | ORAL | Status: DC | PRN
  Administered 2022-02-17 – 2022-02-21 (×5): 650 mg via ORAL
  Filled 2022-02-16 (×5): qty 2

## 2022-02-16 MED ORDER — ORAL CARE MOUTH RINSE
15.0000 mL | Freq: Two times a day (BID) | OROMUCOSAL | Status: DC
  Administered 2022-02-17 – 2022-02-22 (×11): 15 mL via OROMUCOSAL

## 2022-02-16 MED ORDER — ACETAMINOPHEN 650 MG RE SUPP: 650.0000 mg | Freq: Four times a day (QID) | RECTAL | Status: DC | PRN

## 2022-02-16 MED ORDER — MORPHINE SULFATE (PF) 2 MG/ML IV SOLN
2.0000 mg | Freq: Once | INTRAVENOUS | Status: AC
  Administered 2022-02-16: 2 mg via INTRAVENOUS
  Filled 2022-02-16: qty 1

## 2022-02-16 MED ORDER — LEVALBUTEROL HCL 0.63 MG/3ML IN NEBU: 0.6300 mg | INHALATION_SOLUTION | Freq: Four times a day (QID) | RESPIRATORY_TRACT | Status: DC

## 2022-02-16 MED ORDER — SODIUM CHLORIDE 0.9 % IV SOLN
500.0000 mg | INTRAVENOUS | Status: AC
  Administered 2022-02-16 – 2022-02-20 (×5): 500 mg via INTRAVENOUS
  Filled 2022-02-16 (×5): qty 5

## 2022-02-16 MED ORDER — PREGABALIN 75 MG PO CAPS
300.0000 mg | ORAL_CAPSULE | Freq: Two times a day (BID) | ORAL | Status: DC
  Administered 2022-02-17 – 2022-02-22 (×12): 300 mg via ORAL
  Filled 2022-02-16 (×12): qty 4

## 2022-02-16 MED ORDER — LACTATED RINGERS IV SOLN
INTRAVENOUS | Status: AC
Start: 2022-02-16 — End: 2022-02-17

## 2022-02-16 MED ORDER — SODIUM CHLORIDE 0.9 % IV SOLN
2.0000 g | INTRAVENOUS | Status: AC
  Administered 2022-02-16 – 2022-02-20 (×4): 2 g via INTRAVENOUS
  Filled 2022-02-16 (×5): qty 20

## 2022-02-16 MED ORDER — LEVALBUTEROL HCL 0.63 MG/3ML IN NEBU: 0.6300 mg | INHALATION_SOLUTION | RESPIRATORY_TRACT | Status: DC | PRN

## 2022-02-16 MED ORDER — IRBESARTAN 150 MG PO TABS
150.0000 mg | ORAL_TABLET | Freq: Every day | ORAL | Status: DC
  Administered 2022-02-17 – 2022-02-22 (×6): 150 mg via ORAL
  Filled 2022-02-16 (×6): qty 1

## 2022-02-16 MED ORDER — PANTOPRAZOLE SODIUM 40 MG PO TBEC
40.0000 mg | DELAYED_RELEASE_TABLET | Freq: Every day | ORAL | Status: DC
Start: 2022-02-17 — End: 2022-02-21
  Administered 2022-02-17 – 2022-02-21 (×5): 40 mg via ORAL
  Filled 2022-02-16 (×6): qty 1

## 2022-02-16 MED ORDER — INSULIN ASPART 100 UNIT/ML IJ SOLN
0.0000 [IU] | Freq: Three times a day (TID) | INTRAMUSCULAR | Status: DC
  Administered 2022-02-17: 2 [IU] via SUBCUTANEOUS
  Administered 2022-02-18 (×2): 3 [IU] via SUBCUTANEOUS
  Administered 2022-02-18: 2 [IU] via SUBCUTANEOUS
  Administered 2022-02-19: 5 [IU] via SUBCUTANEOUS
  Administered 2022-02-19: 8 [IU] via SUBCUTANEOUS
  Administered 2022-02-19: 3 [IU] via SUBCUTANEOUS
  Administered 2022-02-20: 8 [IU] via SUBCUTANEOUS
  Administered 2022-02-20: 11 [IU] via SUBCUTANEOUS
  Administered 2022-02-20: 5 [IU] via SUBCUTANEOUS

## 2022-02-16 MED ORDER — DM-GUAIFENESIN ER 30-600 MG PO TB12
1.0000 | ORAL_TABLET | Freq: Two times a day (BID) | ORAL | Status: DC
  Administered 2022-02-17 – 2022-02-22 (×12): 1 via ORAL
  Filled 2022-02-16 (×13): qty 1

## 2022-02-16 MED ORDER — LACTATED RINGERS IV BOLUS
500.0000 mL | Freq: Once | INTRAVENOUS | Status: AC
  Administered 2022-02-16: 500 mL via INTRAVENOUS

## 2022-02-16 MED ORDER — PRAMIPEXOLE DIHYDROCHLORIDE 1 MG PO TABS
1.0000 mg | ORAL_TABLET | Freq: Every day | ORAL | Status: DC
  Administered 2022-02-17 – 2022-02-22 (×6): 1 mg via ORAL
  Filled 2022-02-16 (×6): qty 1

## 2022-02-16 MED ORDER — INSULIN ASPART 100 UNIT/ML IJ SOLN
0.0000 [IU] | Freq: Every day | INTRAMUSCULAR | Status: DC
  Administered 2022-02-17 – 2022-02-18 (×2): 2 [IU] via SUBCUTANEOUS
  Administered 2022-02-19: 3 [IU] via SUBCUTANEOUS

## 2022-02-16 MED ORDER — ENOXAPARIN SODIUM 40 MG/0.4ML IJ SOSY
40.0000 mg | PREFILLED_SYRINGE | Freq: Every day | INTRAMUSCULAR | Status: DC
  Administered 2022-02-16 – 2022-02-18 (×3): 40 mg via SUBCUTANEOUS
  Filled 2022-02-16 (×3): qty 0.4

## 2022-02-16 MED ORDER — ROSUVASTATIN CALCIUM 10 MG PO TABS
10.0000 mg | ORAL_TABLET | Freq: Every day | ORAL | Status: DC
  Administered 2022-02-17 – 2022-02-22 (×6): 10 mg via ORAL
  Filled 2022-02-16 (×6): qty 1

## 2022-02-16 MED ORDER — IOHEXOL 350 MG/ML SOLN
100.0000 mL | Freq: Once | INTRAVENOUS | Status: AC | PRN
  Administered 2022-02-16: 100 mL via INTRAVENOUS

## 2022-02-16 NOTE — H&P (Signed)
?History and Physical  ? ? ?Patient: Beverly Tran [id number] DOB: 30-Mar-1971 ?DOA: 02/16/2022 ?DOS: the patient was seen and examined on 02/16/2022 ?[facility]  ?Patient coming from: Home ? ?Chief Complaint:  ?Chief Complaint  ?Patient presents with  ? Shortness of Breath  ? ?HPI: Beverly Marylandriscilla D Sheahan is a 51 y.o. female with medical history significant of type 2 diabetes mellitus, chronic respiratory failure with hypoxia and hypercapnia on supplemental oxygen at 5 LPM, hypertension, hyperlipidemia, diabetic neuropathy, GERD, COPD (chronic bronchitis), RLS who presents to the emergency department due to several days of onset of shortness of breath, she was [facility] yesterday due to sepsis secondary to acute on chronic hypoxic respiratory failure due to multifocal pneumonia in which she was treated with empiric IV ceftriaxone and azithromycin and was placed on BiPAP at FiO2 of 80%, she was also placed on IV pressors (Levophed) due to hypotension.  Culture done in the ED was still pending, COVID-19 virus, influenza and RSV done was negative.  This morning, patient decided to leave AMA.  On arrival at home, despite using home oxygen (5 L), she was still hypoxic, so the husband activated EMS and on arrival of EMS team, she was placed on supplemental oxygen via NRB and patient was taken to the ED for further evaluation and management. ? ?ED Course:  ?In the emergency department, she was tachypneic and tachycardic, BP was 126/56.  O2 sat was 93% on NRB at 10 L/min, patient was subsequently placed on BiPAP due to eventual hypoxia on NRB.  Work-up in the ED showed normal CBC except for leukocytosis, BMP showed hypokalemia, hyperglycemia, albumin 3.2, BNP 266.0, urinalysis was positive for glycosuria but unimpressive for UTI. ?Chest x-ray showed progression of left lower lobe infiltrate probable pneumonia with significant improvement in right lung infiltrate. ?CT of  chest with contrast showed no evidence of pulmonary embolism, but showed severe bilateral multifocal pneumonia, most confluent in the right lower lobe. ?She was empirically started on IV ceftriaxone and azithromycin, IV hydration was provided and potassium was replenished.  Hospitalist was asked to admit patient for further evaluation and management. ? ?Review of Systems: ?Review of systems as noted in the HPI. All other systems reviewed and are negative. ? ? ?Past Medical History:  ?Diagnosis Date  ? Anxiety   ? Arthritis   ? Asthma   ? Back pain, chronic   ? Depression   ? GERD (gastroesophageal reflux disease)   ? High cholesterol   ? Hypertension   ? IBS (irritable bowel syndrome)   ? Migraine   ? ?Past Surgical History:  ?Procedure Laterality Date  ? APPENDECTOMY    ? CESAREAN SECTION    ? CHOLECYSTECTOMY    ? COLONOSCOPY  2003  ? poor prep, grossly normal rectum, colon, and TI. Path not available.   ? ESOPHAGOGASTRODUODENOSCOPY  2003  ? Dr. Jena Gaussourk:  normal esophagus, stomach, duodenum s/p small bowel biopsy.   ? HERNIA REPAIR    ? TUBAL LIGATION    ? ? ?Social History:  reports that she quit smoking about 2 years ago. Her smoking use included cigarettes. She has a 7.00 pack-year smoking history. She has never used smokeless tobacco. She reports that she does not drink alcohol and does not use drugs. ? ? ?Allergies  ?Allergen Reactions  ? Diclofenac Anaphylaxis  ? Neoma LamingSalami [Pickled Meat] Shortness Of Breath and Swelling  ?  Also Allergic to POPCORN: same reaction  ? Aspirin Other (See  Comments)  ?  Nose bleeds  ? Bee Venom Swelling  ?  WASP/HORNET  ? Compazine [Prochlorperazine] Hives  ? Imitrex [Sumatriptan] Hives  ? Nubain [Nalbuphine Hcl] Swelling and Other (See Comments)  ?  Eye swelling  ? Thorazine [Chlorpromazine] Hives  ? Topamax [Topiramate] Other (See Comments)  ?  Confusion and out of it  ? Voltaren [Diclofenac Sodium] Swelling  ? Zofran [Ondansetron Hcl] Nausea Only  ? ? ?Family History  ?Problem  Relation Age of Onset  ? Seizures Mother   ? Heart failure Mother   ? Heart failure Father   ? Colon cancer Neg Hx   ? Colon polyps Neg Hx   ?  ? ?Prior to Admission medications   ?Medication Sig Start Date End Date Taking? Authorizing Provider  ?amitriptyline (ELAVIL) 25 MG tablet Take 25 mg by mouth at bedtime. 07/05/21   [provider]  ?Cholecalciferol (VITAMIN D3) 10 MCG (400 UNIT) CAPS Take 400 mg by mouth daily.    [provider]  ?colestipol (COLESTID) 1 g tablet TAKE TWO (2) TABLETS BY MOUTH TWICE DAILY 05/12/21   Gelene Mink, NP  ?EPINEPHrine 0.3 mg/0.3 mL IJ SOAJ injection Inject 0.3 mg into the muscle as directed. 05/11/21   [provider]  ?famotidine (PEPCID) 20 MG tablet TAKE ONE TABLET BY MOUTH AFTER SUPPER. 03/14/21   Oretha Milch, MD  ?furosemide (LASIX) 40 MG tablet Take 40 mg by mouth daily.    [provider]  ?hydrOXYzine (ATARAX/VISTARIL) 25 MG tablet Take 25 mg by mouth daily as needed for anxiety. 05/21/21   [provider]  ?ipratropium-albuterol (DUONEB) 0.5-2.5 (3) MG/3ML SOLN Take 3 mLs by nebulization every 4 (four) hours as needed (shortness of breath or wheezing).    [provider]  ?lamoTRIgine (LAMICTAL) 25 MG tablet Take 25 mg by mouth daily. 06/14/21   [provider]  ?levocetirizine (XYZAL) 5 MG tablet Take 10 mg by mouth at bedtime.    [provider]  ?LORazepam (ATIVAN) 0.5 MG tablet Take 0.5 mg by mouth at bedtime. 06/10/21   [provider]  ?metFORMIN (GLUCOPHAGE) 500 MG tablet Take 500-1,000 mg by mouth See admin instructions. Take 1000 mg by mouth in the morning and 500 mg in the evening    [provider]  ?montelukast (SINGULAIR) 10 MG tablet Take 10 mg by mouth daily with supper.    [provider]  ?olmesartan (BENICAR) 20 MG tablet Take 20 mg by mouth daily.    [provider]  ?oxyCODONE-acetaminophen (PERCOCET) 10-325 MG tablet Take 1 tablet by mouth 3  (three) times daily as needed for moderate pain.    [provider]  ?pantoprazole (PROTONIX) 40 MG tablet TAKE ONE TABLET BY MOUTH DAILY (MORNING) 10/13/21   Nyoka Cowden, MD  ?pioglitazone (ACTOS) 45 MG tablet Take 45 mg by mouth daily.    [provider]  ?pramipexole (MIRAPEX) 1 MG tablet Take 1 mg by mouth at bedtime.    [provider]  ?pregabalin (LYRICA) 300 MG capsule Take 300 mg by mouth 2 (two) times daily.    [provider]  ?promethazine (PHENERGAN) 25 MG tablet TAKE ONE TABLET BY MOUTH EVERY 8 HOURS AS NEEDED FOR NAUSEA AND VOMITING 02/08/22   Gelene Mink, NP  ?promethazine-dextromethorphan (PROMETHAZINE-DM) 6.25-15 MG/5ML syrup Take 5 mLs by mouth every 8 (eight) hours as needed for cough. 06/28/21   [provider]  ?propranolol (INDERAL) 10 MG tablet Take 10  mg by mouth at bedtime. 02/26/20   [provider]  ?QUEtiapine (SEROQUEL) 50 MG tablet Take 100 mg by mouth at bedtime.    [provider]  ?rosuvastatin (CRESTOR) 10 MG tablet Take 10 mg by mouth at bedtime.    [provider]  ? ? ?Physical Exam: ?BP 117/74   Pulse (!) 112   Temp 98 ?F (36.7 ?C) (Axillary)   Resp (!) 25   Ht 5\' 3"  (1.6 m)   Wt 103.4 kg   SpO2 93%   BMI 40.39 kg/m?  ? ?General: 51 y.o. year-old obese female ill appearing on BiPAP but in no acute distress.  Alert and oriented x3. ?HEENT: NCAT, EOMI ?Neck: Supple, trachea medial ?Cardiovascular: Tachycardia.  Regular rate and rhythm with no rubs or gallops.  No thyromegaly or JVD noted.  2/4 pulses in all 4 extremities. ?Respiratory: Tachypnea.  Diffuse rhonchi and rails on auscultation.   ?Abdomen: Soft, nontender nondistended with normal bowel sounds x4 quadrants. ?Muskuloskeletal: No cyanosis or clubbing.  Trace lower extremity edema noted bilaterally ?Neuro: CN II-XII intact, strength 5/5 x 4, sensation, reflexes intact ?Skin: No ulcerative lesions noted or rashes ?Psychiatry: Judgement and  insight appear normal. Mood is appropriate for condition and setting ?   ?   ?   ?Labs on Admission:  ?Basic Metabolic Panel: ?Recent Labs  ?Lab 02/16/22 ?1749  ?NA 142  ?K 2.9*  ?CL 105  ?CO2 28  ?GLUCOSE 172*  ?BUN 12  ?

## 2022-02-16 NOTE — Sepsis Progress Note (Signed)
Code Sepsis protocol being monitored by elink 

## 2022-02-16 NOTE — ED Notes (Signed)
Multiple unsuccessful attempts by this RN to obtain a second IV access ?

## 2022-02-16 NOTE — Progress Notes (Addendum)
Assessed patient.  Patient currently on Bipap at 12/6, R12, 80% with a sat of 92 to 94%.  BS are Rhonchi and Diminished in the bases.  Did not hear any wheezing during assessment.  Patient's current HR is 112, BP 122/77. ?

## 2022-02-16 NOTE — ED Triage Notes (Signed)
Patient arrived via EMS with complaints of SOB. Per EMS upon arrival 83% on [place] 3 LPM. EMS gave albuterol and solumedrol 125mg . Patient was admitted at [facility] yesterday for pneumonia and left AMA today. ?

## 2022-02-16 NOTE — ED Notes (Signed)
Meds held due to pt being npo r/t bipap ?

## 2022-02-16 NOTE — Progress Notes (Signed)
Transported patient to CT and then from CT to [place] on Bipap without incident.  Report given to ICU RT. ?

## 2022-02-16 NOTE — ED Provider Notes (Signed)
?[facility] EMERGENCY DEPARTMENT ?Provider Note ? ? ?CSN: [id number] ?Arrival date & time: 02/16/22  1729 ? ?  ? ?History ? ?Chief Complaint  ?Patient presents with  ? Shortness of Breath  ? ? ?Beverly Tran is a 51 y.o. female who presents today for evaluation of shortness of breath. ?[facility] and was in the intensive care unit overnight on BiPAP with multifocal pneumonia, sepsis with hypotension requiring Levophed and this morning left AGAINST MEDICAL ADVICE. ? ?She normally wears 4 to 5 L of oxygen nasal cannula. ? ? ?[facility]  ?"Patient with multifocal pneumonia and acute on chronic respiratory failure (uses home O2 at 4 L as needed) ?Patient was hypotensive on arrival, tachycardic, and febrile with elevated lactate. Unable to identify specific bacterium however likely community-acquired versus atypical. Patient was placed on empiric IV antibiotics including Rocephin and Zithromax, placed on BiPAP with FiO2 80%, and initially started on Levophed infusion due to hypotension. Initial lactic acid elevated at 3.3, had trended downward with IV fluid resuscitation and IV antibiotics. Patient was pancultured in the ED, and results are pending, further work-up truncated as patient had decided to leave AMA. COVID-19, influenza, and RSV negative." ? ?She received IV Rocephin and azithromycin.  She was reportedly pancultured however I cannot see these results. ? ?Her CXR at [facility]: ?"XR Chest Portable ? ?Result Date: 02/15/2022 ?CLINICAL DATA: Fever  ? ?EXAM: PORTABLE CHEST 1 VIEW  ? ?COMPARISON: 11/09/2021  ? ?FINDINGS: Single frontal view of the chest demonstrates an unremarkable cardiac silhouette. Multifocal bilateral airspace disease, right greater than left. Small right effusion. No pneumothorax. No acute bony abnormalities.  ? ?1. Bilateral airspace disease, right greater than left, consistent with multifocal pneumonia. 2. Small right pleural effusion.  ? ?Electronically  Signed By: Sharlet SalinaMichael Brown M.D. On: 02/15/2022 19:10  " ? ?HPI ? ?  ? ?Home Medications ?Prior to Admission medications   ?Medication Sig Start Date End Date Taking? Authorizing Provider  ?amitriptyline (ELAVIL) 25 MG tablet Take 25 mg by mouth at bedtime. 07/05/21   [provider]  ?Cholecalciferol (VITAMIN D3) 10 MCG (400 UNIT) CAPS Take 400 mg by mouth daily.    [provider]  ?colestipol (COLESTID) 1 g tablet TAKE TWO (2) TABLETS BY MOUTH TWICE DAILY 05/12/21   Gelene MinkBoone, Anna W, NP  ?EPINEPHrine 0.3 mg/0.3 mL IJ SOAJ injection Inject 0.3 mg into the muscle as directed. 05/11/21   [provider]  ?famotidine (PEPCID) 20 MG tablet TAKE ONE TABLET BY MOUTH AFTER SUPPER. 03/14/21   Oretha MilchAlva, Rakesh V, MD  ?furosemide (LASIX) 40 MG tablet Take 40 mg by mouth daily.    [provider]  ?hydrOXYzine (ATARAX/VISTARIL) 25 MG tablet Take 25 mg by mouth daily as needed for anxiety. 05/21/21   [provider]  ?ipratropium-albuterol (DUONEB) 0.5-2.5 (3) MG/3ML SOLN Take 3 mLs by nebulization every 4 (four) hours as needed (shortness of breath or wheezing).    [provider]  ?lamoTRIgine (LAMICTAL) 25 MG tablet Take 25 mg by mouth daily. 06/14/21   [provider]  ?levocetirizine (XYZAL) 5 MG tablet Take 10 mg by mouth at bedtime.    [provider]  ?LORazepam (ATIVAN) 0.5 MG tablet Take 0.5 mg by mouth at bedtime. 06/10/21   [provider]  ?metFORMIN (GLUCOPHAGE) 500 MG tablet Take 500-1,000 mg by mouth See admin instructions. Take 1000 mg by mouth in the morning and 500 mg in the evening  [provider]  ?montelukast (SINGULAIR) 10 MG tablet Take 10 mg by mouth daily with supper.    [provider]  ?olmesartan (BENICAR) 20 MG tablet Take 20 mg by mouth daily.    [provider]  ?oxyCODONE-acetaminophen (PERCOCET) 10-325 MG tablet Take 1 tablet by mouth 3 (three) times daily as needed for moderate pain.    [provider]  ?pantoprazole (PROTONIX) 40 MG tablet TAKE ONE TABLET BY MOUTH DAILY (MORNING) 10/13/21   Nyoka Cowden, MD  ?pioglitazone (ACTOS) 45 MG tablet Take 45 mg by mouth daily.    [provider]  ?pramipexole (MIRAPEX) 1 MG tablet Take 1 mg by mouth at bedtime.    [provider]  ?pregabalin (LYRICA) 300 MG capsule Take 300 mg by mouth 2 (two) times daily.    [provider]  ?promethazine (PHENERGAN) 25 MG tablet TAKE ONE TABLET BY MOUTH EVERY 8 HOURS AS NEEDED FOR NAUSEA AND VOMITING 02/08/22   Gelene Mink, NP  ?promethazine-dextromethorphan (PROMETHAZINE-DM) 6.25-15 MG/5ML syrup Take 5 mLs by mouth every 8 (eight) hours as needed for cough. 06/28/21   [provider]  ?propranolol (INDERAL) 10 MG tablet Take 10 mg by mouth at bedtime. 02/26/20   [provider]  ?QUEtiapine (SEROQUEL) 50 MG tablet Take 100 mg by mouth at bedtime.    [provider]  ?rosuvastatin (CRESTOR) 10 MG tablet Take 10 mg by mouth at bedtime.    [provider]  ?   ? ?Allergies    ?Diclofenac, Salami [pickled meat], Aspirin, Bee venom, Compazine [prochlorperazine], Imitrex [sumatriptan], Nubain [nalbuphine hcl], Thorazine [chlorpromazine], Topamax [topiramate], Voltaren [diclofenac sodium], and Zofran [ondansetron hcl]   ? ?Review of Systems   ?Review of Systems ? ?Physical Exam ?Updated Vital Signs ?BP 122/77   Pulse (!) 111   Temp 99.8 ?F (37.7 ?C) (Axillary)   Resp (!) 33   Ht 5\' 3"  (1.6 m)   Wt 103.4 kg   SpO2 93%   BMI 40.39 kg/m?  ?Physical Exam ? ?ED Results / Procedures / Treatments   ?Labs ?(all labs ordered are listed, but only abnormal results are displayed) ?Labs Reviewed  ?LACTIC ACID, PLASMA - Abnormal; Notable for the following components:  ?    Result Value  ? Lactic Acid, Venous 2.8 (*)   ? All other components within normal limits  ?LACTIC ACID, PLASMA - Abnormal; Notable for the following components:  ? Lactic Acid, Venous 2.4 (*)   ?  All other components within normal limits  ?COMPREHENSIVE METABOLIC PANEL - Abnormal; Notable for the following components:  ? Potassium 2.9 (*)   ? Glucose, Bld 172 (*)   ? Calcium 8.1 (*)   ? Albumin 3.2 (*)   ? All other components within normal limits  ?CBC WITH DIFFERENTIAL/PLATELET - Abnormal; Notable for the following components:  ? WBC 19.3 (*)   ? RDW 18.1 (*)   ? nRBC 0.3 (*)   ? Neutro Abs 13.3 (*)   ? All other components within normal limits  ?PROTIME-INR - Abnormal; Notable for the following components:  ? Prothrombin Time 15.7 (*)   ? INR 1.3 (*)   ? All other components within normal limits  ?URINALYSIS, ROUTINE W REFLEX MICROSCOPIC - Abnormal; Notable for the following components:  ? APPearance HAZY (*)   ? Glucose, UA >=500 (*)   ? Hgb urine dipstick SMALL (*)   ? Bacteria, UA FEW (*)   ? All other components within  normal limits  ?BRAIN NATRIURETIC PEPTIDE - Abnormal; Notable for the following components:  ? B Natriuretic Peptide 266.0 (*)   ? All other components within normal limits  ?CULTURE, BLOOD (ROUTINE X 2)  ?CULTURE, BLOOD (ROUTINE X 2)  ?URINE CULTURE  ?RESP PANEL BY RT-PCR (FLU A&B, COVID) ARPGX2  ?APTT  ?PREGNANCY, URINE  ? ? ?EKG ?None ? ?Radiology ?DG Chest Port 1 View ? ?Result Date: 02/16/2022 ?CLINICAL DATA:  Question sepsis EXAM: PORTABLE CHEST 1 VIEW COMPARISON:  Portable chest 02/15/2022 FINDINGS: Progressive airspace disease left lower lobe, probable pneumonia. Significant improvement in airspace disease in the right lung with small amount of residual right lower lobe infiltrate and small right effusion. Pulmonary vascularity normal. IMPRESSION: Progression of left lower lobe infiltrate probable pneumonia Significant improvement in right lung infiltrate. Electronically Signed   By: Marlan Palau M.D.   On: 02/16/2022 18:22   ? ?Procedures ?Marland KitchenCritical Care ?Performed by: Cristina Gong, PA-C ?Authorized by: Cristina Gong, PA-C  ? ?Critical care provider statement:  ?   Critical care time (minutes):  35 ?  Critical care time was exclusive of:  Separately billable procedures and treating other patients and teaching time ?  Critical care was necessary to treat or prevent imminent

## 2022-02-16 NOTE — ED Notes (Signed)
Called lab to add on urine ?

## 2022-02-17 ENCOUNTER — Inpatient Hospital Stay (HOSPITAL_COMMUNITY): Payer: 59

## 2022-02-17 DIAGNOSIS — R0602 Shortness of breath: Secondary | ICD-10-CM | POA: Diagnosis not present

## 2022-02-17 DIAGNOSIS — J189 Pneumonia, unspecified organism: Secondary | ICD-10-CM | POA: Diagnosis not present

## 2022-02-17 LAB — BLOOD GAS, ARTERIAL
Acid-Base Excess: 3 mmol/L — ABNORMAL HIGH (ref 0.0–2.0)
Bicarbonate: 29 mmol/L — ABNORMAL HIGH (ref 20.0–28.0)
Drawn by: 38235
FIO2: 90 %
O2 Saturation: 96.2 %
Patient temperature: 37
pCO2 arterial: 49 mmHg — ABNORMAL HIGH (ref 32–48)
pH, Arterial: 7.38 (ref 7.35–7.45)
pO2, Arterial: 71 mmHg — ABNORMAL LOW (ref 83–108)

## 2022-02-17 LAB — ECHOCARDIOGRAM COMPLETE
AR max vel: 3.02 cm2
AV Area VTI: 3.19 cm2
AV Area mean vel: 2.65 cm2
AV Mean grad: 3 mmHg
AV Peak grad: 6.3 mmHg
Ao pk vel: 1.25 m/s
Area-P 1/2: 3.39 cm2
Calc EF: 54.6 %
Height: 63 in
S' Lateral: 3 cm
Single Plane A2C EF: 60.9 %
Single Plane A4C EF: 47.3 %
Weight: 3629.65 oz

## 2022-02-17 LAB — CBC
HCT: 40.5 % (ref 36.0–46.0)
Hemoglobin: 12.7 g/dL (ref 12.0–15.0)
MCH: 31.4 pg (ref 26.0–34.0)
MCHC: 31.4 g/dL (ref 30.0–36.0)
MCV: 100 fL (ref 80.0–100.0)
Platelets: 187 10*3/uL (ref 150–400)
RBC: 4.05 MIL/uL (ref 3.87–5.11)
RDW: 18.5 % — ABNORMAL HIGH (ref 11.5–15.5)
WBC: 17.2 10*3/uL — ABNORMAL HIGH (ref 4.0–10.5)
nRBC: 0.2 % (ref 0.0–0.2)

## 2022-02-17 LAB — COMPREHENSIVE METABOLIC PANEL
ALT: 15 U/L (ref 0–44)
AST: 14 U/L — ABNORMAL LOW (ref 15–41)
Albumin: 3 g/dL — ABNORMAL LOW (ref 3.5–5.0)
Alkaline Phosphatase: 47 U/L (ref 38–126)
Anion gap: 9 (ref 5–15)
BUN: 14 mg/dL (ref 6–20)
CO2: 26 mmol/L (ref 22–32)
Calcium: 8.2 mg/dL — ABNORMAL LOW (ref 8.9–10.3)
Chloride: 109 mmol/L (ref 98–111)
Creatinine, Ser: 0.5 mg/dL (ref 0.44–1.00)
GFR, Estimated: >60 mL/min (ref >60)
Glucose, Bld: 143 mg/dL — ABNORMAL HIGH (ref 70–99)
Potassium: 3.5 mmol/L (ref 3.5–5.1)
Sodium: 144 mmol/L (ref 135–145)
Total Bilirubin: 0.7 mg/dL (ref 0.3–1.2)
Total Protein: 6.6 g/dL (ref 6.5–8.1)

## 2022-02-17 LAB — GLUCOSE, CAPILLARY
Glucose-Capillary: 135 mg/dL — ABNORMAL HIGH (ref 70–99)
Glucose-Capillary: 114 mg/dL — ABNORMAL HIGH (ref 70–99)
Glucose-Capillary: 96 mg/dL (ref 70–99)
Glucose-Capillary: 227 mg/dL — ABNORMAL HIGH (ref 70–99)

## 2022-02-17 LAB — HEMOGLOBIN A1C
Hgb A1c MFr Bld: 7 % — ABNORMAL HIGH (ref 4.8–5.6)
Mean Plasma Glucose: 154.2 mg/dL

## 2022-02-17 LAB — STREP PNEUMONIAE URINARY ANTIGEN: Strep Pneumo Urinary Antigen: NEGATIVE

## 2022-02-17 LAB — HIV ANTIBODY (ROUTINE TESTING W REFLEX): HIV Screen 4th Generation wRfx: NONREACTIVE

## 2022-02-17 LAB — PHOSPHORUS: Phosphorus: 2.5 mg/dL (ref 2.5–4.6)

## 2022-02-17 LAB — PROCALCITONIN: Procalcitonin: 4.15 ng/mL

## 2022-02-17 LAB — MRSA NEXT GEN BY PCR, NASAL: MRSA by PCR Next Gen: DETECTED — AB

## 2022-02-17 LAB — LACTIC ACID, PLASMA
Lactic Acid, Venous: 1.1 mmol/L (ref 0.5–1.9)
Lactic Acid, Venous: 1.9 mmol/L (ref 0.5–1.9)

## 2022-02-17 LAB — MAGNESIUM: Magnesium: 2.1 mg/dL (ref 1.7–2.4)

## 2022-02-17 MED ORDER — VANCOMYCIN HCL 2000 MG/400ML IV SOLN
2000.0000 mg | Freq: Once | INTRAVENOUS | Status: AC
  Administered 2022-02-17: 2000 mg via INTRAVENOUS
  Filled 2022-02-17: qty 400

## 2022-02-17 MED ORDER — CHLORHEXIDINE GLUCONATE CLOTH 2 % EX PADS
6.0000 | MEDICATED_PAD | Freq: Every day | CUTANEOUS | Status: DC
  Administered 2022-02-17 – 2022-02-22 (×6): 6 via TOPICAL

## 2022-02-17 MED ORDER — POTASSIUM CHLORIDE CRYS ER 20 MEQ PO TBCR
40.0000 meq | EXTENDED_RELEASE_TABLET | Freq: Once | ORAL | Status: AC
Start: 2022-02-17 — End: 2022-02-17
  Administered 2022-02-17: 40 meq via ORAL
  Filled 2022-02-17 (×2): qty 2

## 2022-02-17 MED ORDER — VANCOMYCIN HCL IN DEXTROSE 1-5 GM/200ML-% IV SOLN
1000.0000 mg | Freq: Two times a day (BID) | INTRAVENOUS | Status: AC
  Administered 2022-02-17 – 2022-02-21 (×9): 1000 mg via INTRAVENOUS
  Filled 2022-02-17 (×9): qty 200

## 2022-02-17 MED ORDER — IPRATROPIUM-ALBUTEROL 0.5-2.5 (3) MG/3ML IN SOLN
3.0000 mL | RESPIRATORY_TRACT | Status: DC
Start: 2022-02-18 — End: 2022-02-21
  Administered 2022-02-17 – 2022-02-21 (×21): 3 mL via RESPIRATORY_TRACT
  Filled 2022-02-17 (×21): qty 3

## 2022-02-17 MED ORDER — MUPIROCIN 2 % EX OINT
TOPICAL_OINTMENT | Freq: Two times a day (BID) | CUTANEOUS | Status: DC
  Administered 2022-02-18: 1 via NASAL
  Filled 2022-02-17 (×2): qty 22

## 2022-02-17 MED ORDER — IPRATROPIUM-ALBUTEROL 0.5-2.5 (3) MG/3ML IN SOLN
3.0000 mL | Freq: Three times a day (TID) | RESPIRATORY_TRACT | Status: DC
  Administered 2022-02-17 (×3): 3 mL via RESPIRATORY_TRACT
  Filled 2022-02-17 (×3): qty 3

## 2022-02-17 MED ORDER — METHYLPREDNISOLONE SODIUM SUCC 40 MG IJ SOLR
40.0000 mg | Freq: Two times a day (BID) | INTRAMUSCULAR | Status: DC
  Administered 2022-02-17 – 2022-02-21 (×8): 40 mg via INTRAVENOUS
  Filled 2022-02-17 (×8): qty 1

## 2022-02-17 NOTE — Progress Notes (Signed)
Lab called critical result of MRSA +  ?Standing orders placed ?

## 2022-02-17 NOTE — Progress Notes (Signed)
?PROGRESS NOTE ? ? ? ? ?Beverly Tran, is a 51 y.o. female, DOB - [birth year], HMC:[id number] ? ?Admit date - 02/16/2022   Admitting Physician Frankey Shown, DO ? ?Outpatient Primary MD for the patient is [facility] ? ?LOS - 1 ? ?Chief Complaint  ?Patient presents with  ? Shortness of Breath  ?    ? ? ?Brief Narrative:  ?51 y.o. female with medical history significant of type 2 diabetes mellitus, chronic respiratory failure with hypoxia and hypercapnia on supplemental oxygen at 5 LPM, hypertension, hyperlipidemia, diabetic neuropathy, GERD, COPD (chronic bronchitis), RLS admitted on 02/16/2022 with sepsis secondary to multifocal pneumonia resulting in acute on chronic hypoxic respiratory failure requiring prolonged BiPAP use  ? ?  ?-Assessment and Plan: ? ?1)Sepsis secondary to Multifocal PNA---- POA ?-MRSA Nasal swab is +ve ?-Continue IV vancomycin, Rocephin and azithromycin pending further culture data ?-Continue bronchodilators mucolytics and supplemental oxygen ? ?2)Acute on chronic Hypoxic respiratory failure secondary to 1 above ?-PTA at baseline patient was using 5 L of oxygen via nasal cannula ?-Currently requiring prolonged BiPAP use at 90% FiO2 ? ?3)Social/Ethics--- patient is not legally married, she apparently has no siblings and no kids ?-She would like her boyfriend Mr. Rejeana Brock to be her decision maker in the event that she can longer make decisions for herself ?-Patient wishes were verified by RN Shon Baton ?-Patient wishes to be a full code ? ?4)Morbid Obesity-/possible OSA ?-Low calorie diet, portion control and increase physical activity discussed with patient ?-Continue BiPAP nightly and as needed during the day ?-Body mass index is 40.19 kg/m?. ? ? ?5)HTN--continue Avapro ? ?6) chronic pain/restless leg syndrome----continue Lyrica and Mirapex ? ?7)HLD--continue Crestor ? ?8)DM2-A1c 7.0 reflecting fair diabetic control ?-Hold metformin, hold Actos ?Use  Novolog/Humalog Sliding scale insulin with Accu-Cheks/Fingersticks as ordered  ?-Anticipate worsening hyperglycemic control in the setting of steroid use ? ?9)Presumed COPD----patient quit smoking around 2018 ?-Given worsening respiratory status due to steroids, along with bronchodilators and mucolytics ?-Antibiotics as above #1 ? ? ?Disposition/Need for in-Hospital Stay- patient unable to be discharged at this time due to =--acute on chronic hypoxic respiratory failure secondary to sepsis with pneumonia requiring prolonged BiPAP use IV antibiotics and IV steroids ?-Patient's condition is guarded--- may require intubation if the compresses ? ?Status is: Inpatient  ? ?Disposition: The patient is from: Home ?             Anticipated d/c is to: Home ?             Anticipated d/c date is: > 3 days ?             Patient currently is not medically stable to d/c. ?Barriers: Not Clinically Stable-  ? ?Code Status :  -  Code Status: Full Code  ? ?Family Communication:    (patient is alert, awake and coherent)  ?Discussed with s/o Mr Rejeana Brock [phone] ? ?DVT Prophylaxis  :   - SCDs  enoxaparin (LOVENOX) injection 40 mg Start: 02/16/22 2200 ?SCDs Start: 02/16/22 2141 ? ? ?Lab Results  ?Component Value Date  ? PLT 187 02/17/2022  ? ? ?Inpatient Medications ? ?Scheduled Meds: ? Chlorhexidine Gluconate Cloth  6 each Topical Daily  ? dextromethorphan-guaiFENesin  1 tablet Oral BID  ? enoxaparin (LOVENOX) injection  40 mg Subcutaneous QHS  ? feeding supplement (GLUCERNA SHAKE)  237 mL Oral TID BM  ? insulin aspart  0-15 Units Subcutaneous TID WC  ? insulin aspart  0-5 Units Subcutaneous QHS  ?  ipratropium-albuterol  3 mL Nebulization TID  ? irbesartan  150 mg Oral Daily  ? mouth rinse  15 mL Mouth Rinse q12n4p  ? montelukast  10 mg Oral Q supper  ? mupirocin ointment   Nasal BID  ? pantoprazole  40 mg Oral Daily  ? pramipexole  1 mg Oral QHS  ? pregabalin  300 mg Oral BID  ? rosuvastatin  10 mg Oral QHS  ? ?Continuous  Infusions: ? azithromycin Stopped (02/16/22 1926)  ? cefTRIAXone (ROCEPHIN)  IV 2 g (02/17/22 1630)  ? vancomycin    ? ?PRN Meds:.acetaminophen **OR** acetaminophen, levalbuterol ? ? ?Anti-infectives (From admission, onward)  ? ? Start     Dose/Rate Route Frequency Ordered Stop  ? 02/17/22 2000  vancomycin (VANCOCIN) IVPB 1000 mg/200 mL premix       ? 1,000 mg ?200 mL/hr over 60 Minutes Intravenous Every 12 hours 02/17/22 0755    ? 02/17/22 0845  vancomycin (VANCOREADY) IVPB 2000 mg/400 mL       ? 2,000 mg ?200 mL/hr over 120 Minutes Intravenous  Once 02/17/22 0754 02/17/22 1114  ? 02/16/22 1815  cefTRIAXone (ROCEPHIN) 2 g in sodium chloride 0.9 % 100 mL IVPB       ? 2 g ?200 mL/hr over 30 Minutes Intravenous Every 24 hours 02/16/22 1805 02/21/22 1814  ? 02/16/22 1815  azithromycin (ZITHROMAX) 500 mg in sodium chloride 0.9 % 250 mL IVPB       ? 500 mg ?250 mL/hr over 60 Minutes Intravenous Every 24 hours 02/16/22 1805 02/21/22 1814  ? ?  ? ?  ? ?Subjective: ?Beverly Tran today has no fevers, no emesis,  No chest pain,   ?Significant dyspnea persist ?-Desaturates when she comes off BiPAP ?-BiPAP currently with FiO2 of 90% with O2 sats just in the low 90s ?-Respiratory status remains tenuous ?-Patient may need intubation if decompensates further ?-Patient wants to remain a full code ? ? ?Objective: ?Vitals:  ? 02/17/22 1400 02/17/22 1500 02/17/22 1520 02/17/22 1600  ?BP: (!) 133/49 138/70  134/68  ?Pulse: (!) 114 (!) 108  (!) 123  ?Resp: (!) 28 (!) 24  (!) 26  ?Temp:   (!) 100.8 ?F (38.2 ?C)   ?TempSrc:   Axillary   ?SpO2: 94% 96%  95%  ?Weight:      ?Height:      ? ? ?Intake/Output Summary (Last 24 hours) at 02/17/2022 1651 ?Last data filed at 02/17/2022 1630 ?Gross per 24 hour  ?Intake 4177.12 ml  ?Output 2600 ml  ?Net 1577.12 ml  ? ?Filed Weights  ? 02/16/22 1733 02/17/22 0352  ?Weight: 103.4 kg 102.9 kg  ? ? ?Physical Exam ?Gen:- Awake Alert, morbidly obese with respiratory distress ?HEENT:- .AT, No sclera  icterus--BiPAP mask ?Neck-Supple Neck,No JVD,.  ?Lungs-diminished breath sounds, scattered rhonchi bilaterally  ?CV- S1, S2 normal, regular  ?Abd-  +ve B.Sounds, Abd Soft, No tenderness, increased truncal adiposity ?Extremity/Skin:- No  edema, pedal pulses present  ?Psych-affect is appropriate, oriented x3 ?Neuro-generalized weakness, no new focal deficits, no tremors ? ?Data Reviewed: I have personally reviewed following labs and imaging studies ? ?CBC: ?Recent Labs  ?Lab 02/16/22 ?1749 02/17/22 ?5697  ?WBC 19.3* 17.2*  ?NEUTROABS 13.3*  --   ?HGB 13.6 12.7  ?HCT 42.7 40.5  ?MCV 97.7 100.0  ?PLT 202 187  ? ?Basic Metabolic Panel: ?Recent Labs  ?Lab 02/16/22 ?1749 02/17/22 ?9480  ?NA 142 144  ?K 2.9* 3.5  ?CL 105 109  ?CO2 28 26  ?  GLUCOSE 172* 143*  ?BUN 12 14  ?CREATININE 0.61 0.50  ?CALCIUM 8.1* 8.2*  ?MG  --  2.1  ?PHOS  --  2.5  ? ?GFR: ?Estimated Creatinine Clearance: 96.4 mL/min (by C-G formula based on SCr of 0.5 mg/dL). ?Liver Function Tests: ?Recent Labs  ?Lab 02/16/22 ?1749 02/17/22 ?[id number]  ?AST 19 14*  ?ALT 18 15  ?ALKPHOS 42 47  ?BILITOT 0.5 0.7  ?PROT 6.7 6.6  ?ALBUMIN 3.2* 3.0*  ? ?Cardiac Enzymes: ?No results for input(s): CKTOTAL, CKMB, CKMBINDEX, TROPONINI in the last 168 hours. ?BNP (last 3 results) ?No results for input(s): PROBNP in the last 8760 hours. ?HbA1C: ?Recent Labs  ?  02/16/22 ?1749  ?HGBA1C 7.0*  ? ?Sepsis Labs: ?@LABRCNTIP (procalcitonin:4,lacticidven:4) ?) ?Recent Results (from the past 240 hour(s))  ?Blood Culture (routine x 2)     Status: None (Preliminary result)  ? Collection Time: 02/16/22  6:02 PM  ? Specimen: Left Antecubital; Blood  ?Result Value Ref Range Status  ? Specimen Description LEFT ANTECUBITAL  Final  ? Special Requests   Final  ?  BOTTLES DRAWN AEROBIC AND ANAEROBIC Blood Culture adequate volume  ? Culture   Final  ?  NO GROWTH < 12 HOURS ?Performed at [facility], [address] [place], [place] [id number] ?  ? Report Status PENDING  Incomplete  ?Blood Culture  (routine x 2)     Status: None (Preliminary result)  ? Collection Time: 02/16/22  6:03 PM  ? Specimen: BLOOD LEFT FOREARM  ?Result Value Ref Range Status  ? Specimen Description BLOOD LEFT FOREARM  Final  ? Special

## 2022-02-17 NOTE — Progress Notes (Signed)
Pharmacy Antibiotic Note ? ?Beverly Tran a 51 y.o. female admitted on 02/17/2022 with pneumonia.  Pharmacy has been consulted for vancomycin dosing. ? ?Plan: ?Vancomycin 1000mg  IV every 12 hours.  Goal trough 15-20 mcg/mL. ? ?Medical History: ?Past Medical History:  ?Diagnosis Date  ? Anxiety   ? Arthritis   ? Asthma   ? Back pain, chronic   ? Depression   ? GERD (gastroesophageal reflux disease)   ? High cholesterol   ? Hypertension   ? IBS (irritable bowel syndrome)   ? Migraine   ? ? ?Allergies:  ?Allergies  ?Allergen Reactions  ? Diclofenac Anaphylaxis  ? Pearlean Brownie Meat] Shortness Of Breath and Swelling  ?  Also Allergic to POPCORN: same reaction  ? Aspirin Other (See Comments)  ?  Nose bleeds  ? Bee Venom Swelling  ?  WASP/HORNET  ? Compazine [Prochlorperazine] Hives  ? Imitrex [Sumatriptan] Hives  ? Nubain [Nalbuphine Hcl] Swelling and Other (See Comments)  ?  Eye swelling  ? Thorazine [Chlorpromazine] Hives  ? Topamax [Topiramate] Other (See Comments)  ?  Confusion and out of it  ? Voltaren [Diclofenac Sodium] Swelling  ? Zofran [Ondansetron Hcl] Nausea Only  ? ? ?Filed Weights  ? 02/16/22 1733 02/17/22 0352  ?Weight: 103.4 kg (228 lb) 102.9 kg (226 lb 13.7 oz)  ? ? ? ?  Latest Ref Rng & Units 02/17/2022  ?  3:41 AM 02/16/2022  ?  5:49 PM 08/18/2010  ?  9:52 PM  ?CBC  ?WBC 4.0 - 10.5 K/uL 17.2   19.3     ?Hemoglobin 12.0 - 15.0 g/dL 12.7   13.6   13.3    ?Hematocrit 36.0 - 46.0 % 40.5   42.7   39.0    ?Platelets 150 - 400 K/uL 187   202     ? ? ? ?Estimated Creatinine Clearance: 96.4 mL/min (by C-G formula based on SCr of 0.5 mg/dL). ? ?Antibiotics Given (last 72 hours)   ? ? Date/Time Action Medication Dose Rate  ? 02/16/22 1812 New Bag/Given  ? cefTRIAXone (ROCEPHIN) 2 g in sodium chloride 0.9 % 100 mL IVPB 2 g 200 mL/hr  ? 02/16/22 1820 New Bag/Given  ? azithromycin (ZITHROMAX) 500 mg in sodium chloride 0.9 % 250 mL IVPB 500 mg 250 mL/hr  ? ?  ? ? ?Antimicrobials this admission: ? ?vancomycin  02/17/2022  >>  ?Ceftriaxone 02/16/2022 >> ?Azithromycin 02/16/2022 >> ? ?Microbiology results: ?02/16/2022  BCx: sent ?02/16/2022  UCx: sent ?02/16/2022  Resp Panel: negative  ?02/17/2022  MRSA PCR: positive  ? ?Thank you for allowing pharmacy to be a part of this patient?s care. ? ?Thomasenia Sales, PharmD ?Clinical Pharmacist ? ? ?

## 2022-02-17 NOTE — TOC Progression Note (Signed)
?  Transition of Care (TOC) Screening Note ? ? ?Patient Details  ?Name: Beverly Tran ?Date of Birth: 09-May-1971 ? ? ?Transition of Care (TOC) CM/SW Contact:    ?Leitha Bleak, RN ?Phone Number: ?02/17/2022, 10:12 AM ? ?In ICU, very ill, TOC will follow. ? ?Transition of Care Department [facility]) has reviewed patient and no TOC needs have been identified at this time. We will continue to monitor patient advancement through interdisciplinary progression rounds. If new patient transition needs arise, please place a TOC consult. ? ? ?  ?Barriers to Discharge: Continued Medical Work up ?

## 2022-02-18 DIAGNOSIS — J189 Pneumonia, unspecified organism: Secondary | ICD-10-CM | POA: Diagnosis not present

## 2022-02-18 LAB — CBC
HCT: 39.6 % (ref 36.0–46.0)
Hemoglobin: 12.4 g/dL (ref 12.0–15.0)
MCH: 31.3 pg (ref 26.0–34.0)
MCHC: 31.3 g/dL (ref 30.0–36.0)
MCV: 100 fL (ref 80.0–100.0)
Platelets: 172 10*3/uL (ref 150–400)
RBC: 3.96 MIL/uL (ref 3.87–5.11)
RDW: 18.6 % — ABNORMAL HIGH (ref 11.5–15.5)
WBC: 14.9 10*3/uL — ABNORMAL HIGH (ref 4.0–10.5)
nRBC: 0.3 % — ABNORMAL HIGH (ref 0.0–0.2)

## 2022-02-18 LAB — LEGIONELLA PNEUMOPHILA SEROGP 1 UR AG: L. pneumophila Serogp 1 Ur Ag: NEGATIVE

## 2022-02-18 LAB — URINE CULTURE

## 2022-02-18 LAB — GLUCOSE, CAPILLARY
Glucose-Capillary: 135 mg/dL — ABNORMAL HIGH (ref 70–99)
Glucose-Capillary: 181 mg/dL — ABNORMAL HIGH (ref 70–99)
Glucose-Capillary: 179 mg/dL — ABNORMAL HIGH (ref 70–99)
Glucose-Capillary: 213 mg/dL — ABNORMAL HIGH (ref 70–99)

## 2022-02-18 MED ORDER — FUROSEMIDE 10 MG/ML IJ SOLN
INTRAMUSCULAR | Status: AC
  Filled 2022-02-18: qty 4

## 2022-02-18 MED ORDER — FUROSEMIDE 10 MG/ML IJ SOLN
40.0000 mg | Freq: Once | INTRAMUSCULAR | Status: AC
  Administered 2022-02-18: 40 mg via INTRAVENOUS
  Filled 2022-02-18: qty 4

## 2022-02-18 MED ORDER — LABETALOL HCL 5 MG/ML IV SOLN
10.0000 mg | INTRAVENOUS | Status: DC | PRN
Start: 2022-02-18 — End: 2022-02-23
  Filled 2022-02-18: qty 4

## 2022-02-18 NOTE — Progress Notes (Signed)
?PROGRESS NOTE ? ? ? ? ?Beverly Tran, is a 51 y.o. female, DOB - 24-Oct-1970, JAS:[id number] ? ?Admit date - 02/16/2022   Admitting Physician Frankey Shown, DO ? ?Outpatient Primary MD for the patient is [facility] ? ?LOS - 2 ? ?Chief Complaint  ?Patient presents with  ? Shortness of Breath  ?    ? ? ?Brief Narrative:  ?51 y.o. female with medical history significant of type 2 diabetes mellitus, chronic respiratory failure with hypoxia and hypercapnia on supplemental oxygen at 5 LPM, hypertension, hyperlipidemia, diabetic neuropathy, GERD, COPD (chronic bronchitis), RLS admitted on 02/16/2022 with sepsis secondary to multifocal pneumonia resulting in acute on chronic hypoxic respiratory failure requiring prolonged BiPAP use  ? ?  ?-Assessment and Plan: ? ?1)Sepsis secondary to Multifocal PNA---- POA ?-MRSA Nasal swab is +ve ?-Continue IV vancomycin, Rocephin and azithromycin pending further culture data ?-Continue bronchodilators mucolytics and supplemental oxygen ? ?2)Acute on chronic Hypoxic respiratory failure secondary to 1 above ?-PTA at baseline patient was using 5 L of oxygen via nasal cannula ?-Currently requiring prolonged BiPAP use alternating with high flow nasal cannula at 25 L/min with FiO2 of 80% ? ?3)Social/Ethics--- patient is not legally married, she apparently has no siblings and no kids ?-She would like her boyfriend Mr. Rejeana Brock to be her decision maker in the event that she can longer make decisions for herself ?-Patient wishes were verified by RN Shon Baton ?-Patient wishes to be a full code ? ?4)Morbid Obesity-/possible OSA ?-Low calorie diet, portion control and increase physical activity discussed with patient ?-Continue BiPAP nightly and as needed during the day ?-Body mass index is 41.2 kg/m?. ? ? ?5)HTN--continue Avapro ? ?6) chronic pain/restless leg syndrome----continue Lyrica and Mirapex ? ?7)HLD--continue Crestor ? ?8)DM2-A1c 7.0 reflecting fair  diabetic control ?-Hold metformin, hold Actos ?Use Novolog/Humalog Sliding scale insulin with Accu-Cheks/Fingersticks as ordered  ?-Anticipate worsening hyperglycemic control in the setting of steroid use ? ?9)Presumed COPD----patient quit smoking around 2018 ?-Given worsening respiratory continue steroids, along with bronchodilators and mucolytics ?-Antibiotics as above #1 ? ? ?Disposition/Need for in-Hospital Stay- patient unable to be discharged at this time due to =--acute on chronic hypoxic respiratory failure secondary to sepsis with pneumonia requiring prolonged BiPAP use IV antibiotics and IV steroids ?-Patient's condition is guarded--- may require intubation if she decompensates  ? ?status is: Inpatient  ? ?Disposition: The patient is from: Home ?             Anticipated d/c is to: Home ?             Anticipated d/c date is: > 3 days ?             Patient currently is not medically stable to d/c. ?Barriers: Not Clinically Stable-  ? ?Code Status :  -  Code Status: Full Code  ? ?Family Communication:    (patient is alert, awake and coherent)  ?Discussed with s/o Mr Rejeana Brock -[phone] ? ?DVT Prophylaxis  :   - SCDs  enoxaparin (LOVENOX) injection 40 mg Start: 02/16/22 2200 ?SCDs Start: 02/16/22 2141 ? ? ?Lab Results  ?Component Value Date  ? PLT 172 02/18/2022  ? ? ?Inpatient Medications ? ?Scheduled Meds: ? Chlorhexidine Gluconate Cloth  6 each Topical Daily  ? dextromethorphan-guaiFENesin  1 tablet Oral BID  ? enoxaparin (LOVENOX) injection  40 mg Subcutaneous QHS  ? feeding supplement (GLUCERNA SHAKE)  237 mL Oral TID BM  ? insulin aspart  0-15 Units Subcutaneous TID WC  ?  insulin aspart  0-5 Units Subcutaneous QHS  ? ipratropium-albuterol  3 mL Nebulization Q4H  ? irbesartan  150 mg Oral Daily  ? mouth rinse  15 mL Mouth Rinse q12n4p  ? methylPREDNISolone (SOLU-MEDROL) injection  40 mg Intravenous Q12H  ? montelukast  10 mg Oral Q supper  ? mupirocin ointment   Nasal BID  ? pantoprazole  40 mg  Oral Daily  ? pramipexole  1 mg Oral QHS  ? pregabalin  300 mg Oral BID  ? rosuvastatin  10 mg Oral QHS  ? ?Continuous Infusions: ? azithromycin Stopped (02/17/22 1838)  ? cefTRIAXone (ROCEPHIN)  IV Stopped (02/17/22 1700)  ? vancomycin Stopped (02/18/22 0840)  ? ?PRN Meds:.acetaminophen **OR** acetaminophen, labetalol, levalbuterol ? ? ?Anti-infectives (From admission, onward)  ? ? Start     Dose/Rate Route Frequency Ordered Stop  ? 02/17/22 2000  vancomycin (VANCOCIN) IVPB 1000 mg/200 mL premix       ? 1,000 mg ?200 mL/hr over 60 Minutes Intravenous Every 12 hours 02/17/22 0755    ? 02/17/22 0845  vancomycin (VANCOREADY) IVPB 2000 mg/400 mL       ? 2,000 mg ?200 mL/hr over 120 Minutes Intravenous  Once 02/17/22 0754 02/17/22 1817  ? 02/16/22 1815  cefTRIAXone (ROCEPHIN) 2 g in sodium chloride 0.9 % 100 mL IVPB       ? 2 g ?200 mL/hr over 30 Minutes Intravenous Every 24 hours 02/16/22 1805 02/21/22 1814  ? 02/16/22 1815  azithromycin (ZITHROMAX) 500 mg in sodium chloride 0.9 % 250 mL IVPB       ? 500 mg ?250 mL/hr over 60 Minutes Intravenous Every 24 hours 02/16/22 1805 02/21/22 1814  ? ?  ? ? Subjective: ?Beverly Tran today has no fevers, no emesis,  No chest pain,   ? ?Requiring HFNC alternating with Bipap ?Cough and dyspnea persist ?No chest pains ?- ? ?Objective: ?Vitals:  ? 02/18/22 1302 02/18/22 1450 02/18/22 1500 02/18/22 1629  ?BP:  (!) 157/91 (!) 162/77   ?Pulse:  (!) 106 (!) 105   ?Resp:  (!) 30 (!) 26   ?Temp:   98.9 ?F (37.2 ?C)   ?TempSrc:   Oral   ?SpO2: 93% (!) 89% (!) 89% (!) 89%  ?Weight:      ?Height:      ? ? ?Intake/Output Summary (Last 24 hours) at 02/18/2022 1845 ?Last data filed at 02/18/2022 1535 ?Gross per 24 hour  ?Intake 649.87 ml  ?Output 2250 ml  ?Net -1600.13 ml  ? ?Filed Weights  ? 02/16/22 1733 02/17/22 0352 02/18/22 0500  ?Weight: 103.4 kg 102.9 kg 105.5 kg  ? ? ?Physical Exam ?Gen:- Awake Alert, morbidly obese with respiratory distress ?HEENT:- [place].AT, No sclera icterus--BiPAP mask  /HFNC ?Neck-Supple Neck,No JVD,.  ?Lungs-diminished breath sounds, scattered rhonchi bilaterally  ?CV- S1, S2 normal, regular  ?Abd-  +ve B.Sounds, Abd Soft, No tenderness, increased truncal adiposity ?Extremity/Skin:- No  edema, pedal pulses present  ?Psych-affect is appropriate, oriented x3 ?Neuro-generalized weakness, no new focal deficits, no tremors ? ?Data Reviewed: I have personally reviewed following labs and imaging studies ? ?CBC: ?Recent Labs  ?Lab 02/16/22 ?1749 02/17/22 ?9476 02/18/22 ?5465  ?WBC 19.3* 17.2* 14.9*  ?NEUTROABS 13.3*  --   --   ?HGB 13.6 12.7 12.4  ?HCT 42.7 40.5 39.6  ?MCV 97.7 100.0 100.0  ?PLT 202 187 172  ? ?Basic Metabolic Panel: ?Recent Labs  ?Lab 02/16/22 ?1749 02/17/22 ?0354  ?NA 142 144  ?K 2.9* 3.5  ?CL  105 109  ?CO2 28 26  ?GLUCOSE 172* 143*  ?BUN 12 14  ?CREATININE 0.61 0.50  ?CALCIUM 8.1* 8.2*  ?MG  --  2.1  ?PHOS  --  2.5  ? ?GFR: ?Estimated Creatinine Clearance: 97.8 mL/min (by C-G formula based on SCr of 0.5 mg/dL). ?Liver Function Tests: ?Recent Labs  ?Lab 02/16/22 ?1749 02/17/22 ?[id number]  ?AST 19 14*  ?ALT 18 15  ?ALKPHOS 42 47  ?BILITOT 0.5 0.7  ?PROT 6.7 6.6  ?ALBUMIN 3.2* 3.0*  ? ?Cardiac Enzymes: ?No results for input(s): CKTOTAL, CKMB, CKMBINDEX, TROPONINI in the last 168 hours. ?BNP (last 3 results) ?No results for input(s): PROBNP in the last 8760 hours. ?HbA1C: ?Recent Labs  ?  02/16/22 ?1749  ?HGBA1C 7.0*  ? ?Sepsis Labs: ?@LABRCNTIP (procalcitonin:4,lacticidven:4) ?) ?Recent Results (from the past 240 hour(s))  ?Blood Culture (routine x 2)     Status: None (Preliminary result)  ? Collection Time: 02/16/22  6:02 PM  ? Specimen: Left Antecubital; Blood  ?Result Value Ref Range Status  ? Specimen Description LEFT ANTECUBITAL  Final  ? Special Requests   Final  ?  BOTTLES DRAWN AEROBIC AND ANAEROBIC Blood Culture adequate volume  ? Culture   Final  ?  NO GROWTH 2 DAYS ?Performed at [facility], [address] [place], [place] [id number] ?  ? Report Status PENDING   Incomplete  ?Blood Culture (routine x 2)     Status: None (Preliminary result)  ? Collection Time: 02/16/22  6:03 PM  ? Specimen: BLOOD LEFT FOREARM  ?Result Value Ref Range Status  ? Specimen Descripti

## 2022-02-18 NOTE — Progress Notes (Signed)
Patient taken off bipap and placed on heated high flow nasal cannula by respiratory. Patient tolerating well with sats greater than 90% ?

## 2022-02-19 DIAGNOSIS — J189 Pneumonia, unspecified organism: Secondary | ICD-10-CM | POA: Diagnosis not present

## 2022-02-19 LAB — GLUCOSE, CAPILLARY
Glucose-Capillary: 198 mg/dL — ABNORMAL HIGH (ref 70–99)
Glucose-Capillary: 228 mg/dL — ABNORMAL HIGH (ref 70–99)
Glucose-Capillary: 261 mg/dL — ABNORMAL HIGH (ref 70–99)
Glucose-Capillary: 296 mg/dL — ABNORMAL HIGH (ref 70–99)

## 2022-02-19 MED ORDER — AMLODIPINE BESYLATE 5 MG PO TABS
10.0000 mg | ORAL_TABLET | Freq: Every day | ORAL | Status: DC
Start: 2022-02-19 — End: 2022-02-23
  Administered 2022-02-19 – 2022-02-22 (×4): 10 mg via ORAL
  Filled 2022-02-19 (×4): qty 2

## 2022-02-19 MED ORDER — ENOXAPARIN SODIUM 60 MG/0.6ML IJ SOSY
60.0000 mg | PREFILLED_SYRINGE | Freq: Every day | INTRAMUSCULAR | Status: DC
  Administered 2022-02-19: 60 mg via SUBCUTANEOUS
  Filled 2022-02-19: qty 0.6

## 2022-02-19 NOTE — Progress Notes (Signed)
Patient up in reclining chair x 2 person assist, tolerated fairly well, O2 SAT dropped into the 70s which she reported is baseline for her at home, current at home O2 [place] @ 5L per patient, no extensive SOB noted, demonstrated proper breathing techniques, reiterated coughing & deep breathing techniques which she demonstrated correctly, BLE edema still remains +2 pitting, feet elevated in reclining chair, O2 SAT 93% on HFNC @ 30L/min 85% FiO2 at this time ?

## 2022-02-19 NOTE — Progress Notes (Signed)
?PROGRESS NOTE ? ? ? ? ?Beverly Tran, is a 51 y.o. female, DOB - 1971/06/25, ZOX:[id number] ? ?Admit date - 02/16/2022   Admitting Physician Frankey Shownladapo Adefeso, DO ? ?[facility] the [facility] ? ?LOS - 3 ? ?Chief Complaint  ?Patient presents with  ? Shortness of Breath  ?    ? ? ?Brief Narrative:  ?51 y.o. female with medical history significant of type 2 diabetes mellitus, chronic respiratory failure with hypoxia and hypercapnia on supplemental oxygen at 5 LPM, hypertension, hyperlipidemia, diabetic neuropathy, GERD, COPD (chronic bronchitis), RLS admitted on 02/16/2022 with sepsis secondary to multifocal pneumonia resulting in acute on chronic hypoxic respiratory failure requiring prolonged BiPAP use  ? ?  ?-Assessment and Plan: ? ?1)Sepsis secondary to Multifocal PNA---- POA ?-MRSA Nasal swab is +ve ?-Blood and urine cultures from 02/16/2022 NGTD ?-Flu and COVID-negative ?Procalcitonin 4.15 ?-Strep pneumo and Legionella antigen negative ?-Continue IV vancomycin, Rocephin and azithromycin pending further culture data ?-Continue bronchodilators mucolytics and supplemental oxygen ? ?2)Acute on chronic Hypoxic respiratory failure secondary to 1 above ?-PTA at baseline patient was using 5 L of oxygen via nasal cannula ?-Currently requiring prolonged BiPAP use alternating with high flow nasal cannula at 30 L/min with FiO2 of 85% to keep O2 sats above 90% ? ?3)Social/Ethics--- patient is not legally married, she apparently has no siblings and no kids ?-She would like her boyfriend Mr. Beverly Tran to be her decision maker in the event that she can longer make decisions for herself ?-Patient wishes were verified by RN Shon BatonAshley Shelton ?-Patient wishes to be a full code ? ?4)Morbid Obesity-/possible OSA ?-Low calorie diet, portion control and increase physical activity discussed with patient ?-Continue BiPAP nightly and as needed during the day ?-Body mass index is 40.46  kg/m?. ? ? ?5)HTN--continue Avapro ? ?6) chronic pain/restless leg syndrome----continue Lyrica and Mirapex ? ?7)HLD--continue Crestor ? ?8)DM2-A1c 7.0 reflecting fair diabetic control ?-Hold metformin, hold Actos ?Use Novolog/Humalog Sliding scale insulin with Accu-Cheks/Fingersticks as ordered  ?-Anticipate worsening hyperglycemic control in the setting of steroid use ? ?9)Presumed COPD----patient quit smoking around 2018 ?-Given worsening respiratory continue steroids, along with bronchodilators and mucolytics ?-Antibiotics as above #1 ? ? ?Disposition/Need for in-Hospital Stay- patient unable to be discharged at this time due to =--acute on chronic hypoxic respiratory failure secondary to sepsis with pneumonia requiring prolonged BiPAP use IV antibiotics and IV steroids ?-Patient's condition is guarded--- may require intubation if she decompensates  ? ?status is: Inpatient  ? ?Disposition: The patient is from: Home ?             Anticipated d/c is to: Home ?             Anticipated d/c date is: > 3 days ?             Patient currently is not medically stable to d/c. ?Barriers: Not Clinically Stable-  ? ?Code Status :  -  Code Status: Full Code  ? ?Family Communication:    (patient is alert, awake and coherent)  ?Discussed with s/o Mr Beverly Brocknthony Tran [phone] ? ?DVT Prophylaxis  :   - SCDs  enoxaparin (LOVENOX) injection 40 mg Start: 02/16/22 2200 ?SCDs Start: 02/16/22 2141 ? ? ?Lab Results  ?Component Value Date  ? PLT 172 02/18/2022  ? ? ?Inpatient Medications ? ?Scheduled Meds: ? amLODipine  10 mg Oral Daily  ? Chlorhexidine Gluconate Cloth  6 each Topical Daily  ? dextromethorphan-guaiFENesin  1 tablet Oral BID  ?  enoxaparin (LOVENOX) injection  40 mg Subcutaneous QHS  ? feeding supplement (GLUCERNA SHAKE)  237 mL Oral TID BM  ? insulin aspart  0-15 Units Subcutaneous TID WC  ? insulin aspart  0-5 Units Subcutaneous QHS  ? ipratropium-albuterol  3 mL Nebulization Q4H  ? irbesartan  150 mg Oral Daily  ?  mouth rinse  15 mL Mouth Rinse q12n4p  ? methylPREDNISolone (SOLU-MEDROL) injection  40 mg Intravenous Q12H  ? montelukast  10 mg Oral Q supper  ? mupirocin ointment   Nasal BID  ? pantoprazole  40 mg Oral Daily  ? pramipexole  1 mg Oral QHS  ? pregabalin  300 mg Oral BID  ? rosuvastatin  10 mg Oral QHS  ? ?Continuous Infusions: ? azithromycin 500 mg (02/18/22 1950)  ? cefTRIAXone (ROCEPHIN)  IV 200 mL/hr at 02/18/22 1913  ? vancomycin 1,000 mg (02/19/22 0742)  ? ?PRN Meds:.acetaminophen **OR** acetaminophen, labetalol, levalbuterol ? ? ?Anti-infectives (From admission, onward)  ? ? Start     Dose/Rate Route Frequency Ordered Stop  ? 02/17/22 2000  vancomycin (VANCOCIN) IVPB 1000 mg/200 mL premix       ? 1,000 mg ?200 mL/hr over 60 Minutes Intravenous Every 12 hours 02/17/22 0755    ? 02/17/22 0845  vancomycin (VANCOREADY) IVPB 2000 mg/400 mL       ? 2,000 mg ?200 mL/hr over 120 Minutes Intravenous  Once 02/17/22 0754 02/17/22 1817  ? 02/16/22 1815  cefTRIAXone (ROCEPHIN) 2 g in sodium chloride 0.9 % 100 mL IVPB       ? 2 g ?200 mL/hr over 30 Minutes Intravenous Every 24 hours 02/16/22 1805 02/21/22 1814  ? 02/16/22 1815  azithromycin (ZITHROMAX) 500 mg in sodium chloride 0.9 % 250 mL IVPB       ? 500 mg ?250 mL/hr over 60 Minutes Intravenous Every 24 hours 02/16/22 1805 02/21/22 1814  ? ?  ? ? Subjective: ?Beverly Tran today has no fevers, no emesis,  No chest pain,   ? ?Requiring HFNC alternating with Bipap---Currently requiring prolonged BiPAP use alternating with high flow nasal cannula at 30 L/min with FiO2 of 85% to keep O2 sats above 90% ?Cough and dyspnea persist ?No chest pains ?-Able to get out of bed to chair ?-Boyfriend at bedside ? ?Objective: ?Vitals:  ? 02/19/22 1258 02/19/22 1550 02/19/22 1632 02/19/22 1700  ?BP:    (!) 173/86  ?Pulse:   (!) 110 (!) 109  ?Resp:   (!) 30 (!) 33  ?Temp:  99.4 ?F (37.4 ?C)    ?TempSrc:  Oral    ?SpO2: 90%  (!) 89% 92%  ?Weight:      ?Height:      ? ? ?Intake/Output  Summary (Last 24 hours) at 02/19/2022 1747 ?Last data filed at 02/19/2022 1734 ?Gross per 24 hour  ?Intake 800 ml  ?Output 3175 ml  ?Net -2375 ml  ? ?Filed Weights  ? 02/17/22 0352 02/18/22 0500 02/19/22 0500  ?Weight: 102.9 kg 105.5 kg 103.6 kg  ? ? ?Physical Exam ?Gen:- Awake Alert, morbidly obese improving conversational dyspnea  ?HEENT:- [place].AT, No sclera icterus--BiPAP mask /HFNC ?Neck-Supple Neck,No JVD,.  ?Lungs-diminished breath sounds, scattered rhonchi bilaterally  ?CV- S1, S2 normal, regular  ?Abd-  +ve B.Sounds, Abd Soft, No tenderness, increased truncal adiposity ?Extremity/Skin:- No  edema, pedal pulses present  ?Psych-affect is appropriate, oriented x3 ?Neuro-generalized weakness, no new focal deficits, no tremors ? ?Data Reviewed: I have personally reviewed following labs and imaging studies ? ?CBC: ?  Recent Labs  ?Lab 02/16/22 ?1749 02/17/22 ?0109 02/18/22 ?3235  ?WBC 19.3* 17.2* 14.9*  ?NEUTROABS 13.3*  --   --   ?HGB 13.6 12.7 12.4  ?HCT 42.7 40.5 39.6  ?MCV 97.7 100.0 100.0  ?PLT 202 187 172  ? ?Basic Metabolic Panel: ?Recent Labs  ?Lab 02/16/22 ?1749 02/17/22 ?5732  ?NA 142 144  ?K 2.9* 3.5  ?CL 105 109  ?CO2 28 26  ?GLUCOSE 172* 143*  ?BUN 12 14  ?CREATININE 0.61 0.50  ?CALCIUM 8.1* 8.2*  ?MG  --  2.1  ?PHOS  --  2.5  ? ?GFR: ?Estimated Creatinine Clearance: 96.8 mL/min (by C-G formula based on SCr of 0.5 mg/dL). ?Liver Function Tests: ?Recent Labs  ?Lab 02/16/22 ?1749 02/17/22 ?2025  ?AST 19 14*  ?ALT 18 15  ?ALKPHOS 42 47  ?BILITOT 0.5 0.7  ?PROT 6.7 6.6  ?ALBUMIN 3.2* 3.0*  ? ?Cardiac Enzymes: ?No results for input(s): CKTOTAL, CKMB, CKMBINDEX, TROPONINI in the last 168 hours. ?BNP (last 3 results) ?No results for input(s): PROBNP in the last 8760 hours. ?HbA1C: ?Recent Labs  ?  02/16/22 ?1749  ?HGBA1C 7.0*  ? ?Sepsis Labs: ?@LABRCNTIP (procalcitonin:4,lacticidven:4) ?) ?Recent Results (from the past 240 hour(s))  ?Blood Culture (routine x 2)     Status: None (Preliminary result)  ? Collection  Time: 02/16/22  6:02 PM  ? Specimen: Left Antecubital; Blood  ?Result Value Ref Range Status  ? Specimen Description LEFT ANTECUBITAL  Final  ? Special Requests   Final  ?  BOTTLES DRAWN AEROBIC AND ANAEROB

## 2022-02-20 ENCOUNTER — Inpatient Hospital Stay (HOSPITAL_COMMUNITY): Payer: 59

## 2022-02-20 DIAGNOSIS — J189 Pneumonia, unspecified organism: Secondary | ICD-10-CM | POA: Diagnosis not present

## 2022-02-20 LAB — CBC
HCT: 39.3 % (ref 36.0–46.0)
Hemoglobin: 12.9 g/dL (ref 12.0–15.0)
MCH: 31.7 pg (ref 26.0–34.0)
MCHC: 32.8 g/dL (ref 30.0–36.0)
MCV: 96.6 fL (ref 80.0–100.0)
Platelets: 160 10*3/uL (ref 150–400)
RBC: 4.07 MIL/uL (ref 3.87–5.11)
RDW: 17.4 % — ABNORMAL HIGH (ref 11.5–15.5)
WBC: 11.4 10*3/uL — ABNORMAL HIGH (ref 4.0–10.5)
nRBC: 0.8 % — ABNORMAL HIGH (ref 0.0–0.2)

## 2022-02-20 LAB — GLUCOSE, CAPILLARY
Glucose-Capillary: 203 mg/dL — ABNORMAL HIGH (ref 70–99)
Glucose-Capillary: 267 mg/dL — ABNORMAL HIGH (ref 70–99)
Glucose-Capillary: 349 mg/dL — ABNORMAL HIGH (ref 70–99)
Glucose-Capillary: 328 mg/dL — ABNORMAL HIGH (ref 70–99)

## 2022-02-20 LAB — BASIC METABOLIC PANEL
Anion gap: 8 (ref 5–15)
BUN: 19 mg/dL (ref 6–20)
CO2: 31 mmol/L (ref 22–32)
Calcium: 9.5 mg/dL (ref 8.9–10.3)
Chloride: 102 mmol/L (ref 98–111)
Creatinine, Ser: 0.4 mg/dL — ABNORMAL LOW (ref 0.44–1.00)
GFR, Estimated: >60 mL/min (ref >60)
Glucose, Bld: 230 mg/dL — ABNORMAL HIGH (ref 70–99)
Potassium: 3.6 mmol/L (ref 3.5–5.1)
Sodium: 141 mmol/L (ref 135–145)

## 2022-02-20 MED ORDER — INSULIN ASPART 100 UNIT/ML IJ SOLN
4.0000 [IU] | Freq: Three times a day (TID) | INTRAMUSCULAR | Status: DC
  Administered 2022-02-21 – 2022-02-23 (×7): 4 [IU] via SUBCUTANEOUS

## 2022-02-20 MED ORDER — INSULIN DETEMIR 100 UNIT/ML ~~LOC~~ SOLN
20.0000 [IU] | Freq: Two times a day (BID) | SUBCUTANEOUS | Status: DC
  Administered 2022-02-20 – 2022-02-22 (×5): 20 [IU] via SUBCUTANEOUS
  Filled 2022-02-20 (×8): qty 0.2

## 2022-02-20 MED ORDER — ENOXAPARIN SODIUM 60 MG/0.6ML IJ SOSY
50.0000 mg | PREFILLED_SYRINGE | Freq: Every day | INTRAMUSCULAR | Status: DC
  Administered 2022-02-20 – 2022-02-22 (×3): 50 mg via SUBCUTANEOUS
  Filled 2022-02-20 (×3): qty 0.6

## 2022-02-20 MED ORDER — INSULIN ASPART 100 UNIT/ML IJ SOLN
0.0000 [IU] | Freq: Every day | INTRAMUSCULAR | Status: DC
  Administered 2022-02-20: 4 [IU] via SUBCUTANEOUS
  Administered 2022-02-21: 3 [IU] via SUBCUTANEOUS
  Administered 2022-02-22: 2 [IU] via SUBCUTANEOUS

## 2022-02-20 MED ORDER — INSULIN ASPART 100 UNIT/ML IJ SOLN
0.0000 [IU] | Freq: Three times a day (TID) | INTRAMUSCULAR | Status: DC
  Administered 2022-02-21: 15 [IU] via SUBCUTANEOUS
  Administered 2022-02-21: 11 [IU] via SUBCUTANEOUS
  Administered 2022-02-21: 4 [IU] via SUBCUTANEOUS
  Administered 2022-02-22: 11 [IU] via SUBCUTANEOUS
  Administered 2022-02-22: 3 [IU] via SUBCUTANEOUS
  Administered 2022-02-22: 15 [IU] via SUBCUTANEOUS
  Administered 2022-02-23: 3 [IU] via SUBCUTANEOUS

## 2022-02-20 NOTE — Progress Notes (Signed)
Pharmacy Antibiotic Note ? ?Beverly Tran a 50 y.o. female admitted on 02/20/2022 with sepsis 2nd multifocal PNA and positive MRSA-PCR.  Pharmacy assisting with vancomycin dosing. Renal function remains stable. ? ?Plan: ?Continue vancomycin 1gm IV q 12hrs ?F/u LOT and culture data ? ?Filed Weights  ? 02/17/22 0352 02/18/22 0500 02/19/22 0500  ?Weight: 102.9 kg (226 lb 13.7 oz) 105.5 kg (232 lb 9.4 oz) 103.6 kg (228 lb 6.3 oz)  ? ? ? ?  Latest Ref Rng & Units 02/20/2022  ?  5:27 AM 02/18/2022  ?  6:39 AM 02/17/2022  ?  3:41 AM  ?CBC  ?WBC 4.0 - 10.5 K/uL 11.4   14.9   17.2    ?Hemoglobin 12.0 - 15.0 g/dL 85.4   62.7   03.5    ?Hematocrit 36.0 - 46.0 % 39.3   39.6   40.5    ?Platelets 150 - 400 K/uL 160   172   187    ? ? ? ?Estimated Creatinine Clearance: 96.8 mL/min (A) (by C-G formula based on SCr of 0.4 mg/dL (L)). ? ?Antimicrobials this admission: ? ?vancomycin 02/17/2022  >>  ?Ceftriaxone 02/16/2022 >> ?Azithromycin 02/16/2022 >> ? ?Microbiology results: ?02/16/2022  BCx: day 4 of no growth ?02/16/2022  UCx: multiple spp ?02/16/2022  Resp Panel: negative  ?02/17/2022  MRSA PCR: positive  ? ?Thank you for allowing pharmacy to be a part of this patient?s care. ? ?Caryl Asp, PharmD ?Clinical Pharmacist ?02/20/2022 10:58 AM ? ? ? ? ? ? ? ?

## 2022-02-20 NOTE — Progress Notes (Signed)
Patient stated that she will be going home today. Dr Mariea Clonts had extensive conversation with  patient and boyfriend at the bedside. Patient unsafe to leave the hospital due to need of high levels of oxygen related to lung disease.Informed that leaving the hospital will be against medical advise and will result to complications including death. Conversation witnessed by [employer] and myself. Patient has agreed to stay for 3 more days. ?

## 2022-02-20 NOTE — Progress Notes (Signed)
?PROGRESS NOTE ? ? ?Beverly Tran, is a 51 y.o. female, DOB - 1971/05/16, RRN:[id number] ? ?Admit date - 02/16/2022   Admitting Physician Frankey Shown, DO ? ?Outpatient Primary MD for the patient is [facility] ? ?LOS - 4 ? ?Chief Complaint  ?Patient presents with  ? Shortness of Breath  ?    ? ? ?Brief Narrative:  ?51 y.o. female with medical history significant of type 2 diabetes mellitus, chronic respiratory failure with hypoxia and hypercapnia on supplemental oxygen at 5 LPM, hypertension, hyperlipidemia, diabetic neuropathy, GERD, COPD (chronic bronchitis), RLS admitted on 02/16/2022 with sepsis secondary to multifocal pneumonia resulting in acute on chronic hypoxic respiratory failure requiring prolonged BiPAP use  ? ?  ?-Assessment and Plan: ? ?1)Sepsis secondary to Multifocal PNA---- POA ?-MRSA Nasal swab is +ve ?-Blood and urine cultures from 02/16/2022 NGTD ?-Flu and COVID-negative ?Procalcitonin 4.15 ?-Strep pneumo and Legionella antigen negative ?-Continue IV vancomycin, Rocephin and azithromycin pending further culture data ?-Continue bronchodilators mucolytics and supplemental oxygen ?-Repeat chest x-ray from 02/20/2022 with bilateral pneumonia ? ?2)Acute on chronic Hypoxic respiratory failure secondary to 1 above ?-PTA at baseline patient was using 5 L of oxygen via nasal cannula ?-Continues to use BiPAP nightly ?-Weaned down to 13 L of oxygen via nasal cannula at this time during the day ? ?3)Social/Ethics--- patient is not legally married, she apparently has no siblings and no kids ?-She would like her boyfriend Mr. Rejeana Brock to be her decision maker in the event that she can longer make decisions for herself ?-Patient wishes to be a full code ?02/20/22- ?Patient was threatening to leave AMA ?-She just wants to go home ?-After bedside conference with patient and RN patient is now agreeable to stay  ?-Palliative care consult requested ?-Patient remains a full  code ? ?4)Morbid Obesity-/possible OSA ?-Low calorie diet, portion control and increase physical activity discussed with patient ?-Continue BiPAP nightly and as needed during the day ?-Body mass index is 40.46 kg/m?. ? ? ?5)HTN--continue Avapro ? ?6) chronic pain/restless leg syndrome----continue Lyrica and Mirapex ? ?7)HLD--continue Crestor ? ?8)DM2-A1c 7.0 reflecting fair diabetic control ?-Hold metformin, hold Actos ?-Start Levemir 20 units twice daily ?Use Novolog/Humalog Sliding scale insulin with Accu-Cheks/Fingersticks as ordered  ?-Anticipate worsening hyperglycemic control in the setting of steroid use ? ?9)Presumed COPD----patient quit smoking around 2018 ?-Given worsening respiratory continue steroids, along with bronchodilators and mucolytics ?-Antibiotics as above #1 ? ? ?Disposition/Need for in-Hospital Stay- patient unable to be discharged at this time due to =--acute on chronic hypoxic respiratory failure secondary to sepsis with pneumonia requiring prolonged BiPAP use IV antibiotics and IV steroids ?-Patient's condition is guarded--- may require intubation if she decompensates  ? ?status is: Inpatient  ? ?Disposition: The patient is from: Home ?             Anticipated d/c is to: Home ?             Anticipated d/c date is: > 3 days ?             Patient currently is not medically stable to d/c. ?Barriers: Not Clinically Stable-  ? ?Code Status :  -  Code Status: Full Code  ? ?Family Communication:    (patient is alert, awake and coherent)  ?Discussed with s/o Mr Rejeana Brock -[phone] ? ?DVT Prophylaxis  :   - SCDs  SCDs Start: 02/16/22 2141 ? ? ?Lab Results  ?Component Value Date  ? PLT 160 02/20/2022  ? ? ?Inpatient  Medications ? ?Scheduled Meds: ? amLODipine  10 mg Oral Daily  ? Chlorhexidine Gluconate Cloth  6 each Topical Daily  ? dextromethorphan-guaiFENesin  1 tablet Oral BID  ? enoxaparin (LOVENOX) injection  50 mg Subcutaneous QHS  ? feeding supplement (GLUCERNA SHAKE)  237 mL Oral  TID BM  ? insulin aspart  0-15 Units Subcutaneous TID WC  ? insulin aspart  0-5 Units Subcutaneous QHS  ? ipratropium-albuterol  3 mL Nebulization Q4H  ? irbesartan  150 mg Oral Daily  ? mouth rinse  15 mL Mouth Rinse q12n4p  ? methylPREDNISolone (SOLU-MEDROL) injection  40 mg Intravenous Q12H  ? montelukast  10 mg Oral Q supper  ? mupirocin ointment   Nasal BID  ? pantoprazole  40 mg Oral Daily  ? pramipexole  1 mg Oral QHS  ? pregabalin  300 mg Oral BID  ? rosuvastatin  10 mg Oral QHS  ? ?Continuous Infusions: ? azithromycin 500 mg (02/19/22 1907)  ? cefTRIAXone (ROCEPHIN)  IV 2 g (02/20/22 1657)  ? vancomycin 1,000 mg (02/20/22 0801)  ? ?PRN Meds:.acetaminophen **OR** acetaminophen, labetalol, levalbuterol ? ? ?Anti-infectives (From admission, onward)  ? ? Start     Dose/Rate Route Frequency Ordered Stop  ? 02/17/22 2000  vancomycin (VANCOCIN) IVPB 1000 mg/200 mL premix       ? 1,000 mg ?200 mL/hr over 60 Minutes Intravenous Every 12 hours 02/17/22 0755    ? 02/17/22 0845  vancomycin (VANCOREADY) IVPB 2000 mg/400 mL       ? 2,000 mg ?200 mL/hr over 120 Minutes Intravenous  Once 02/17/22 0754 02/17/22 1817  ? 02/16/22 1815  cefTRIAXone (ROCEPHIN) 2 g in sodium chloride 0.9 % 100 mL IVPB       ? 2 g ?200 mL/hr over 30 Minutes Intravenous Every 24 hours 02/16/22 1805 02/21/22 1814  ? 02/16/22 1815  azithromycin (ZITHROMAX) 500 mg in sodium chloride 0.9 % 250 mL IVPB       ? 500 mg ?250 mL/hr over 60 Minutes Intravenous Every 24 hours 02/16/22 1805 02/21/22 1814  ? ?  ? ? Subjective: ?Beverly Tran today has no fevers, no emesis,  No chest pain,   ? ?-Patient was threatening to leave AMA ?-She just wants to go home ?-After bedside conference with patient and RN patient is now agreeable to stay  ?-She tolerated BiPAP overnight ?-Wean down to 13 L of oxygen at this time ?-Cough and dyspnea persist ?-Boyfriend at bedside ? ?Objective: ?Vitals:  ? 02/20/22 1221 02/20/22 1316 02/20/22 1320 02/20/22 1635  ?BP: (!)  158/66     ?Pulse: (!) 104     ?Resp: 20     ?Temp:      ?TempSrc:      ?SpO2: 95% 91% 91% 91%  ?Weight:      ?Height:      ? ? ?Intake/Output Summary (Last 24 hours) at 02/20/2022 1730 ?Last data filed at 02/20/2022 1500 ?Gross per 24 hour  ?Intake 1353.59 ml  ?Output 2975 ml  ?Net -1621.41 ml  ? ?Filed Weights  ? 02/17/22 0352 02/18/22 0500 02/19/22 0500  ?Weight: 102.9 kg 105.5 kg 103.6 kg  ? ? ?Physical Exam ?Gen:- Awake Alert, morbidly obese improving conversational dyspnea  ?HEENT:- [place].AT, No sclera icterus-- ?Nose- Neche 13L/min ?Neck-Supple Neck,No JVD,.  ?Lungs-diminished breath sounds, scattered rhonchi bilaterally  ?CV- S1, S2 normal, regular  ?Abd-  +ve B.Sounds, Abd Soft, No tenderness, increased truncal adiposity ?Extremity/Skin:- No  edema, pedal pulses present  ?Psych-affect is  appropriate, oriented x3 ?Neuro-generalized weakness, no new focal deficits, no tremors ? ?Data Reviewed: I have personally reviewed following labs and imaging studies ? ?CBC: ?Recent Labs  ?Lab 02/16/22 ?1749 02/17/22 ?1610 02/18/22 ?9604 02/20/22 ?5409  ?WBC 19.3* 17.2* 14.9* 11.4*  ?NEUTROABS 13.3*  --   --   --   ?HGB 13.6 12.7 12.4 12.9  ?HCT 42.7 40.5 39.6 39.3  ?MCV 97.7 100.0 100.0 96.6  ?PLT 202 187 172 160  ? ?Basic Metabolic Panel: ?Recent Labs  ?Lab 02/16/22 ?1749 02/17/22 ?8119 02/20/22 ?1478  ?NA 142 144 141  ?K 2.9* 3.5 3.6  ?CL 105 109 102  ?CO2 28 26 31   ?GLUCOSE 172* 143* 230*  ?BUN 12 14 19   ?CREATININE 0.61 0.50 0.40*  ?CALCIUM 8.1* 8.2* 9.5  ?MG  --  2.1  --   ?PHOS  --  2.5  --   ? ?GFR: ?Estimated Creatinine Clearance: 96.8 mL/min (A) (by C-G formula based on SCr of 0.4 mg/dL (L)). ?Liver Function Tests: ?Recent Labs  ?Lab 02/16/22 ?1749 02/17/22 ?04/18/22  ?AST 19 14*  ?ALT 18 15  ?ALKPHOS 42 47  ?BILITOT 0.5 0.7  ?PROT 6.7 6.6  ?ALBUMIN 3.2* 3.0*  ? ?Cardiac Enzymes: ?No results for input(s): CKTOTAL, CKMB, CKMBINDEX, TROPONINI in the last 168 hours. ?BNP (last 3 results) ?No results for input(s): PROBNP in the  last 8760 hours. ?HbA1C: ?No results for input(s): HGBA1C in the last 72 hours. ? ?Sepsis Labs: ?@LABRCNTIP (procalcitonin:4,lacticidven:4) ?) ?Recent Results (from the past 240 hour(s))  ?Blood Culture (routine x 2)

## 2022-02-21 DIAGNOSIS — A419 Sepsis, unspecified organism: Secondary | ICD-10-CM | POA: Diagnosis not present

## 2022-02-21 DIAGNOSIS — Z515 Encounter for palliative care: Secondary | ICD-10-CM | POA: Diagnosis not present

## 2022-02-21 DIAGNOSIS — R0602 Shortness of breath: Secondary | ICD-10-CM | POA: Diagnosis not present

## 2022-02-21 DIAGNOSIS — Z7189 Other specified counseling: Secondary | ICD-10-CM

## 2022-02-21 DIAGNOSIS — J9621 Acute and chronic respiratory failure with hypoxia: Secondary | ICD-10-CM | POA: Diagnosis not present

## 2022-02-21 DIAGNOSIS — J189 Pneumonia, unspecified organism: Secondary | ICD-10-CM | POA: Diagnosis not present

## 2022-02-21 DIAGNOSIS — J9622 Acute and chronic respiratory failure with hypercapnia: Secondary | ICD-10-CM

## 2022-02-21 LAB — CULTURE, BLOOD (ROUTINE X 2)
Culture: NO GROWTH
Culture: NO GROWTH
Special Requests: ADEQUATE

## 2022-02-21 LAB — GLUCOSE, CAPILLARY
Glucose-Capillary: 200 mg/dL — ABNORMAL HIGH (ref 70–99)
Glucose-Capillary: 252 mg/dL — ABNORMAL HIGH (ref 70–99)
Glucose-Capillary: 308 mg/dL — ABNORMAL HIGH (ref 70–99)
Glucose-Capillary: 274 mg/dL — ABNORMAL HIGH (ref 70–99)

## 2022-02-21 MED ORDER — PANTOPRAZOLE SODIUM 40 MG PO TBEC
40.0000 mg | DELAYED_RELEASE_TABLET | Freq: Two times a day (BID) | ORAL | Status: DC
  Administered 2022-02-21 – 2022-02-23 (×4): 40 mg via ORAL
  Filled 2022-02-21 (×4): qty 1

## 2022-02-21 MED ORDER — METHYLPREDNISOLONE SODIUM SUCC 40 MG IJ SOLR
40.0000 mg | Freq: Two times a day (BID) | INTRAMUSCULAR | Status: DC
  Administered 2022-02-21 – 2022-02-23 (×4): 40 mg via INTRAVENOUS
  Filled 2022-02-21 (×4): qty 1

## 2022-02-21 MED ORDER — SODIUM CHLORIDE 0.9 % IV SOLN: 2.0000 g | INTRAVENOUS | Status: DC

## 2022-02-21 MED ORDER — IPRATROPIUM-ALBUTEROL 0.5-2.5 (3) MG/3ML IN SOLN
3.0000 mL | Freq: Four times a day (QID) | RESPIRATORY_TRACT | Status: DC
  Administered 2022-02-21 – 2022-02-23 (×8): 3 mL via RESPIRATORY_TRACT
  Filled 2022-02-21 (×8): qty 3

## 2022-02-21 MED ORDER — HYDROCORTISONE SOD SUC (PF) 100 MG IJ SOLR: 100.0000 mg | Freq: Two times a day (BID) | INTRAMUSCULAR | Status: DC

## 2022-02-21 NOTE — Consult Note (Signed)
? ?NAME:  Beverly Tran, MRN:  [id number], DOB:  Aug 10, 1971, LOS: 5 ?ADMISSION DATE:  02/16/2022, CONSULTATION DATE:  02/21/22 ?REFERRING MD:  Emokpae/Triad, CHIEF COMPLAINT:  resp distress   ? ?History of Present Illness:  ?51  yowf quit smoking 01/2020 last seen in pulmonary clinic 03/18/21 with dx cough variant asthma vs VCD flared on ACEi and better off acei and on max gerd rx s hypercarbia but using 02 prn and with rec for pfts which were  never done >>>   admitted on 02/16/2022 on ACEi again with dx sepsis secondary to multifocal pneumonia resulting in acute on chronic hypoxic respiratory failure requiring prolonged BiPAP use  ? ? ? ?Pertinent  Medical History  ?DM type 2 with diabetic neuropathy ?HBP ?GERD ?RLS  ? ? ?Significant Hospital Events: ?Including procedures, antibiotic start and stop dates in addition to other pertinent events   ?CTa  5/3 Severe bilateral multifocal pneumonia, most confluent in the right lower lobe. Recommend CT follow-up to resolution.  ?Urine strep/ legionella 5/3 neg  ?MRSA PCR  5/4  POS ? ? ? ? ?Scheduled Meds: ? amLODipine  10 mg Oral Daily  ? Chlorhexidine Gluconate Cloth  6 each Topical Daily  ? dextromethorphan-guaiFENesin  1 tablet Oral BID  ? enoxaparin (LOVENOX) injection  50 mg Subcutaneous QHS  ? feeding supplement (GLUCERNA SHAKE)  237 mL Oral TID BM  ? insulin aspart  0-20 Units Subcutaneous TID WC  ? insulin aspart  0-5 Units Subcutaneous QHS  ? insulin aspart  4 Units Subcutaneous TID WC  ? insulin detemir  20 Units Subcutaneous BID  ? ipratropium-albuterol  3 mL Nebulization Q6H  ? irbesartan  150 mg Oral Daily  ? mouth rinse  15 mL Mouth Rinse q12n4p  ? methylPREDNISolone (SOLU-MEDROL) injection  40 mg Intravenous Q12H  ? montelukast  10 mg Oral Q supper  ? mupirocin ointment   Nasal BID  ? pantoprazole  40 mg Oral Daily  ? pramipexole  1 mg Oral QHS  ? pregabalin  300 mg Oral BID  ? rosuvastatin  10 mg Oral QHS  ? ?Continuous Infusions: ? cefTRIAXone (ROCEPHIN)  IV  2 g (02/20/22 1657)  ? vancomycin Stopped (02/21/22 1224)  ? ?PRN Meds:.acetaminophen **OR** acetaminophen, labetalol, levalbuterol  ? ? ?Interim History / Subjective:  ?Some better sitting in chair on HFN02  ? ?Objective   ?Blood pressure (!) 156/65, pulse 99, temperature 98.1 ?F (36.7 ?C), temperature source Oral, resp. rate (!) 23, height 5\' 3"  (1.6 m), weight 102.9 kg, SpO2 93 %. ?   ?FiO2 (%):  [50 %] 50 %  ? ?Intake/Output Summary (Last 24 hours) at 02/21/2022 1311 ?Last data filed at 02/21/2022 1019 ?Gross per 24 hour  ?Intake 818.76 ml  ?Output --  ?Net 818.76 ml  ? ?Filed Weights  ? 02/18/22 0500 02/19/22 0500 02/21/22 0413  ?Weight: 105.5 kg 103.6 kg 102.9 kg  ? ? ?Examination: ?Tmax:  99.3 ?General appearance:    sitting in chair, recognized me right away   ?At Rest 02 sats  93% on 10 lpm   ?No jvd ?Oropharynx clear,  mucosa nl ?Neck supple/ prominent pseudowheeze ?Lungs with a few scattered exp > insp rhonchi bilaterally but no bronchial changes  ?RRR no s3 or or sign murmur ?Abd obese with limited  excursion  ?Extr warm with no edema or clubbing noted ?Neuro  Sensorium alert ,  no apparent motor deficits  ? ? ? ?I personally reviewed images and agree with  radiology impression as follows:  ?CXR:   portable  5/7 ?1. Shifting bilateral airspace disease worrisome for infection. ?2. Stable cardiomegaly. ? ?  ? ?Assessment & Plan:  ?1) Acute  hypoxemic and hypercarbic resp failure in setting of multifocal pna with mild increase PCT c/w assoc sepsis and ? ALI (inflammatory) superimposed on infectious infiltrates. ?Rec: agree with abx/ would use HC 100 mg IV q 12 h based on recent NEJM (January 04 2022 issue)  ?>>> bipap prn wean as tol ? ?2) underlying UACS ?Upper airway cough syndrome (previously labeled PNDS),  is so named because it's frequently impossible to sort out how much is  CR/sinusitis with freq throat clearing (which can be related to primary GERD)   vs  causing  secondary (" extra esophageal")  GERD  from wide swings in gastric pressure that occur with throat clearing, often  promoting self use of mint and menthol lozenges that reduce the lower esophageal sphincter tone and exacerbate the problem further in a cyclical fashion.  ? ?These are the same pts (now being labeled as having "irritable larynx syndrome" by some cough centers) who not infrequently have a history of having failed to tolerate ace inhibitors,  dry powder inhalers or biphosphonates or report having atypical/extraesophageal reflux symptoms that don't respond to standard doses of PPI  and are easily confused as having aecopd or asthma flares by even experienced allergists/ pulmonologists (myself included).  ?>>> permanently avoid ACEi and rx GERD aggressively until returns (if willing) to my office to complete the w/u.  ? ?Best Practice (right click and "Reselect all SmartList Selections" daily)  ?  ? ?Labs   ?CBC: ?Recent Labs  ?Lab 02/16/22 ?1749 02/17/22 ?[id number] 02/18/22 ?[id number] 02/20/22 ?[id number]  ?WBC 19.3* 17.2* 14.9* 11.4*  ?NEUTROABS 13.3*  --   --   --   ?HGB 13.6 12.7 12.4 12.9  ?HCT 42.7 40.5 39.6 39.3  ?MCV 97.7 100.0 100.0 96.6  ?PLT 202 187 172 160  ? ? ?Basic Metabolic Panel: ?Recent Labs  ?Lab 02/16/22 ?1749 02/17/22 ?[id number] 02/20/22 ?[id number]  ?NA 142 144 141  ?K 2.9* 3.5 3.6  ?CL 105 109 102  ?CO2 28 26 31   ?GLUCOSE 172* 143* 230*  ?BUN 12 14 19   ?CREATININE 0.61 0.50 0.40*  ?CALCIUM 8.1* 8.2* 9.5  ?MG  --  2.1  --   ?PHOS  --  2.5  --   ? ?GFR: ?Estimated Creatinine Clearance: 96.4 mL/min (A) (by C-G formula based on SCr of 0.4 mg/dL (L)). ?Recent Labs  ?Lab 02/16/22 ?1749 02/16/22 ?1802 02/16/22 ?1954 02/16/22 ?2330 02/17/22 ?[id number] 02/18/22 ?[id number] 02/20/22 ?[id number]  ?PROCALCITON  --   --   --   --  4.15  --   --   ?WBC 19.3*  --   --   --  17.2* 14.9* 11.4*  ?LATICACIDVEN  --  2.8* 2.4* 1.9 1.1  --   --   ? ? ?Liver Function Tests: ?Recent Labs  ?Lab 02/16/22 ?1749 02/17/22 ?[id number]  ?AST 19 14*  ?ALT 18 15  ?ALKPHOS 42 47  ?BILITOT 0.5 0.7  ?PROT  6.7 6.6  ?ALBUMIN 3.2* 3.0*  ? ?No results for input(s): LIPASE, AMYLASE in the last 168 hours. ?No results for input(s): AMMONIA in the last 168 hours. ? ?ABG ?   ?Component Value Date/Time  ? PHART 7.38 02/17/2022 2258  ? PCO2ART 49 (H) 02/17/2022 2258  ? PO2ART 71 (L) 02/17/2022 2258  ? HCO3 29.0 (H) 02/17/2022 2258  ? TCO2  22 08/18/2010 2152  ? O2SAT 96.2 02/17/2022 2258  ?  ? ?Coagulation Profile: ?Recent Labs  ?Lab 02/16/22 ?1749  ?INR 1.3*  ? ? ?Cardiac Enzymes: ?No results for input(s): CKTOTAL, CKMB, CKMBINDEX, TROPONINI in the last 168 hours. ? ?HbA1C: ?Hgb A1c MFr Bld  ?Date/Time Value Ref Range Status  ?02/16/2022 05:49 PM 7.0 (H) 4.8 - 5.6 % Final  ?  Comment:  ?  (NOTE) ?Pre diabetes:          5.7%-6.4% ? ?Diabetes:              >6.4% ? ?Glycemic control for   <7.0% ?adults with diabetes ?  ? ? ?CBG: ?Recent Labs  ?Lab 02/20/22 ?1107 02/20/22 ?1618 02/20/22 ?2246 02/21/22 ?0735 02/21/22 ?1057  ?GLUCAP 267* 349* 328* 200* 252*  ? ? ?  ? ?Past Medical History:  ?She,  has a past medical history of Anxiety, Arthritis, Asthma, Back pain, chronic, Depression, GERD (gastroesophageal reflux disease), High cholesterol, Hypertension, IBS (irritable bowel syndrome), and Migraine.  ? ?Surgical History:  ? ?Past Surgical History:  ?Procedure Laterality Date  ? APPENDECTOMY    ? CESAREAN SECTION    ? CHOLECYSTECTOMY    ? COLONOSCOPY  2003  ? poor prep, grossly normal rectum, colon, and TI. Path not available.   ? ESOPHAGOGASTRODUODENOSCOPY  2003  ? Dr. Jena Gauss:  normal esophagus, stomach, duodenum s/p small bowel biopsy.   ? HERNIA REPAIR    ? TUBAL LIGATION    ?  ? ?Social History:  ? reports that she quit smoking about 2 years ago. Her smoking use included cigarettes. She has a 7.00 pack-year smoking history. She has never used smokeless tobacco. She reports that she does not drink alcohol and does not use drugs.  ? ?Family History:  ?Her family history includes Heart failure in her father and mother; Seizures in  her mother. There is no history of Colon cancer or Colon polyps.  ? ?Allergies ?Allergies  ?Allergen Reactions  ? Diclofenac Anaphylaxis  ? Neoma Laming Meat] Shortness Of Breath and Swelling  ?  Also

## 2022-02-21 NOTE — Progress Notes (Addendum)
?PROGRESS NOTE ? ? ?Beverly Tran, is a 51 y.o. female, DOB - Jun 13, 1971, ZOX:[id number] ? ?Admit date - 02/16/2022   Admitting Physician Frankey Shownladapo Adefeso, DO ? ?[facility] ? ?LOS - 5 ? ?Chief Complaint  ?Patient presents with  ? Shortness of Breath  ?    ? ? ?Brief Narrative:  ?51 y.o. female with medical history significant of type 2 diabetes mellitus, chronic respiratory failure with hypoxia and hypercapnia on supplemental oxygen at 5 LPM, hypertension, hyperlipidemia, diabetic neuropathy, GERD, COPD (chronic bronchitis), RLS admitted on 02/16/2022 with sepsis secondary to multifocal pneumonia resulting in acute on chronic hypoxic respiratory failure requiring prolonged BiPAP use  ? ?  ?-Assessment and Plan: ? ?1)Sepsis secondary to Multifocal PNA---- POA ?-MRSA Nasal swab is +ve ?-Blood and urine cultures from 02/16/2022 NGTD ?-Flu and COVID-negative ?Procalcitonin 4.15 ?-Strep pneumo and Legionella antigen negative ?-Okay to discontinue IV vancomycin and azithromycin after today ?-Continue IV Rocephin  ?--Continue bronchodilators mucolytics and supplemental oxygen ?-Repeat chest x-ray from 02/20/2022 consistent with bilateral pneumonia ? ?2)Acute on chronic Hypoxic respiratory failure secondary to 1 above ?-PTA at baseline patient was using 5 L of oxygen via nasal cannula ?-Continues to use BiPAP nightly ?-Weaned down to 10 L of oxygen via nasal cannula at this time during the day ? ?3)Social/Ethics--- patient is not legally married, she apparently has no siblings and no kids ?-She would like her boyfriend Mr. [name] to be her decision maker in the event that she can longer make decisions for herself ?-Patient wishes to be a full code ?02/21/22- ?-Oxygen requirement improving currently down to 10 L of oxygen via nasal cannula ?-Palliative care and pulmonary consult from Dr. Sherene SiresWert pending ?-Patient remains a full code ? ?4)Morbid  Obesity-/possible OSA ?-Low calorie diet, portion control and increase physical activity discussed with patient ?-Continue BiPAP nightly and as needed during the day ?-Body mass index is 40.19 kg/m?. ? ? ?5)HTN--continue Avapro, and amlodipine ?-Labetalol as needed ? ?6) chronic pain/restless leg syndrome----continue Lyrica and Mirapex ? ?7)HLD--continue Crestor ? ?8)DM2-A1c 7.0 reflecting fair diabetic control ?-Hold metformin, hold Actos ?-Patient has developed steroid-induced hyperglycemia ?-Continue Levemir 20 units twice daily ?Use Novolog/Humalog Sliding scale insulin with Accu-Cheks/Fingersticks as ordered  ? ?9)Presumed COPD----patient quit smoking around 2018 ?- continue steroids, along with bronchodilators and mucolytics ?-Antibiotics as above #1 ? ? ?Disposition/Need for in-Hospital Stay- patient unable to be discharged at this time due to =--acute on chronic hypoxic respiratory failure secondary to sepsis with pneumonia requiring prolonged BiPAP use IV antibiotics and IV steroids ?-Possible discharge in a couple of days if continues to improve patient has oxygen at 5 L per nasal cannula at home ? ?status is: Inpatient  ? ?Disposition: The patient is from: Home ?             Anticipated d/c is to: Home ?             Anticipated d/c date is: > 3 days ?             Patient currently is not medically stable to d/c. ?Barriers: Not Clinically Stable-  ? ?Code Status :  -  Code Status: Full Code  ? ?Family Communication:    (patient is alert, awake and coherent)  ?Discussed with s/o Mr Rejeana Brocknthony Graves [phone] ? ?DVT Prophylaxis  :   - SCDs  SCDs Start: 02/16/22 2141 ? ? ?Lab Results  ?Component Value Date  ? PLT 160  02/20/2022  ? ? ?Inpatient Medications ? ?Scheduled Meds: ? amLODipine  10 mg Oral Daily  ? Chlorhexidine Gluconate Cloth  6 each Topical Daily  ? dextromethorphan-guaiFENesin  1 tablet Oral BID  ? enoxaparin (LOVENOX) injection  50 mg Subcutaneous QHS  ? feeding supplement (GLUCERNA SHAKE)   237 mL Oral TID BM  ? insulin aspart  0-20 Units Subcutaneous TID WC  ? insulin aspart  0-5 Units Subcutaneous QHS  ? insulin aspart  4 Units Subcutaneous TID WC  ? insulin detemir  20 Units Subcutaneous BID  ? ipratropium-albuterol  3 mL Nebulization Q6H  ? irbesartan  150 mg Oral Daily  ? mouth rinse  15 mL Mouth Rinse q12n4p  ? methylPREDNISolone (SOLU-MEDROL) injection  40 mg Intravenous Q12H  ? montelukast  10 mg Oral Q supper  ? mupirocin ointment   Nasal BID  ? pantoprazole  40 mg Oral Daily  ? pramipexole  1 mg Oral QHS  ? pregabalin  300 mg Oral BID  ? rosuvastatin  10 mg Oral QHS  ? ?Continuous Infusions: ? cefTRIAXone (ROCEPHIN)  IV 2 g (02/20/22 1657)  ? vancomycin Stopped (02/21/22 6294)  ? ?PRN Meds:.acetaminophen **OR** acetaminophen, labetalol, levalbuterol ? ? ?Anti-infectives (From admission, onward)  ? ? Start     Dose/Rate Route Frequency Ordered Stop  ? 02/17/22 2000  vancomycin (VANCOCIN) IVPB 1000 mg/200 mL premix       ? 1,000 mg ?200 mL/hr over 60 Minutes Intravenous Every 12 hours 02/17/22 0755    ? 02/17/22 0845  vancomycin (VANCOREADY) IVPB 2000 mg/400 mL       ? 2,000 mg ?200 mL/hr over 120 Minutes Intravenous  Once 02/17/22 0754 02/17/22 1817  ? 02/16/22 1815  cefTRIAXone (ROCEPHIN) 2 g in sodium chloride 0.9 % 100 mL IVPB       ? 2 g ?200 mL/hr over 30 Minutes Intravenous Every 24 hours 02/16/22 1805 02/21/22 1814  ? 02/16/22 1815  azithromycin (ZITHROMAX) 500 mg in sodium chloride 0.9 % 250 mL IVPB       ? 500 mg ?250 mL/hr over 60 Minutes Intravenous Every 24 hours 02/16/22 1805 02/20/22 2043  ? ?  ? ? Subjective: ?Beverly Tran today has no fevers, no emesis,  No chest pain,   ? ?Cough and dyspnea improving patient has been weaned down to 10 L of oxygen via nasal cannula at this time ?-Had a BM ? ?Objective: ?Vitals:  ? 02/21/22 0815 02/21/22 0827 02/21/22 0831 02/21/22 1100  ?BP: (!) 156/65     ?Pulse: (!) 105 (!) 101    ?Resp: (!) 35 17    ?Temp:  98.3 ?F (36.8 ?C)  98.1 ?F  (36.7 ?C)  ?TempSrc:  Oral  Oral  ?SpO2: (!) 83% 93% 94%   ?Weight:      ?Height:      ? ? ?Intake/Output Summary (Last 24 hours) at 02/21/2022 1122 ?Last data filed at 02/21/2022 1019 ?Gross per 24 hour  ?Intake 2172.35 ml  ?Output --  ?Net 2172.35 ml  ? ?Filed Weights  ? 02/18/22 0500 02/19/22 0500 02/21/22 0413  ?Weight: 105.5 kg 103.6 kg 102.9 kg  ? ? ?Physical Exam ?Gen:- Awake Alert, morbidly obese improving conversational dyspnea  ?HEENT:- [place].AT, No sclera icterus-- ?Nose- [place] 10L/min ?Neck-Supple Neck,No JVD,.  ?Lungs--slowly improving air movement, no wheezing ?CV- S1, S2 normal, regular  ?Abd-  +ve B.Sounds, Abd Soft, No tenderness, increased truncal adiposity ?Extremity/Skin:- No  edema, pedal pulses present  ?Psych-affect is appropriate,  oriented x3 ?Neuro-generalized weakness, no new focal deficits, no tremors ? ?Data Reviewed: I have personally reviewed following labs and imaging studies ? ?CBC: ?Recent Labs  ?Lab 02/16/22 ?1749 02/17/22 ?9604 02/18/22 ?5409 02/20/22 ?8119  ?WBC 19.3* 17.2* 14.9* 11.4*  ?NEUTROABS 13.3*  --   --   --   ?HGB 13.6 12.7 12.4 12.9  ?HCT 42.7 40.5 39.6 39.3  ?MCV 97.7 100.0 100.0 96.6  ?PLT 202 187 172 160  ? ?Basic Metabolic Panel: ?Recent Labs  ?Lab 02/16/22 ?1749 02/17/22 ?1478 02/20/22 ?2956  ?NA 142 144 141  ?K 2.9* 3.5 3.6  ?CL 105 109 102  ?CO2 28 26 31   ?GLUCOSE 172* 143* 230*  ?BUN 12 14 19   ?CREATININE 0.61 0.50 0.40*  ?CALCIUM 8.1* 8.2* 9.5  ?MG  --  2.1  --   ?PHOS  --  2.5  --   ? ?GFR: ?Estimated Creatinine Clearance: 96.4 mL/min (A) (by C-G formula based on SCr of 0.4 mg/dL (L)). ?Liver Function Tests: ?Recent Labs  ?Lab 02/16/22 ?1749 02/17/22 ?04/18/22  ?AST 19 14*  ?ALT 18 15  ?ALKPHOS 42 47  ?BILITOT 0.5 0.7  ?PROT 6.7 6.6  ?ALBUMIN 3.2* 3.0*  ? ?Cardiac Enzymes: ?No results for input(s): CKTOTAL, CKMB, CKMBINDEX, TROPONINI in the last 168 hours. ?BNP (last 3 results) ?No results for input(s): PROBNP in the last 8760 hours. ?HbA1C: ?No results for input(s): HGBA1C  in the last 72 hours. ? ?Sepsis Labs: ?@LABRCNTIP (procalcitonin:4,lacticidven:4) ?) ?Recent Results (from the past 240 hour(s))  ?Blood Culture (routine x 2)     Status: None  ? Collection Time: 02/16/22  6:02 PM  ?

## 2022-02-21 NOTE — Consult Note (Signed)
? ?Palliative Care Consult Note  ?                                ?Date: 02/21/2022  ? ?Patient Name: Beverly Tran  ?DOB: 1971-02-29  MRN: [id number]  Age / Sex: 51 y.o., female  ?PCP: [facility] ?Referring Physician: [name], MD ? ?Reason for Consultation: Establishing goals of care ? ?HPI/Patient Profile: 51 y.o. female  with past medical history of 51 y.o. female with medical history significant of type 2 diabetes mellitus, chronic respiratory failure with hypoxia and hypercapnia on supplemental oxygen at 5 LPM, hypertension, hyperlipidemia, diabetic neuropathy, GERD, COPD (chronic bronchitis), RLS admitted on 02/16/2022 with sepsis secondary to multifocal pneumonia resulting in acute on chronic hypoxic respiratory failure requiring prolonged BiPAP use. ? ?Today she remains on antibiotics, mucolytics, bronchodilators. Still using BiPap nightly, remains on 10L oxygen (baseline at home 5L). Pulmonar is on board. Patient wanted to leave AMA this weekend/today, but was convinced to stay. ? ?PMT was consulted for [place] conversations. ? ?Past Medical History:  ?Diagnosis Date  ?? Anxiety   ?? Arthritis   ?? Asthma   ?? Back pain, chronic   ?? Depression   ?? GERD (gastroesophageal reflux disease)   ?? High cholesterol   ?? Hypertension   ?? IBS (irritable bowel syndrome)   ?? Migraine   ? ? ?Subjective:  ? ?This NP Walden Field reviewed medical records, received report from team, assessed the patient and then meet at the patient's bedside to discuss diagnosis, prognosis, GOC, EOL wishes disposition and options. ? ?I met with the patient at her bedside.  Also present was her boyfriend/significant other [name]. ?  ?Concept of Palliative Care was introduced as specialized medical care for people and their families living with serious illness.  If focuses on providing relief from the symptoms and stress of a serious illness.  The  goal is to improve quality of life for both the patient and the family. Values and goals of care important to patient and family were attempted to be elicited. ? ?Created space and opportunity for patient  and family to explore thoughts and feelings regarding current medical situation ?  ?Natural trajectory and current clinical status were discussed. Questions and concerns addressed. Patient  encouraged to call with questions or concerns.   ? ?Patient/Family Understanding of Illness: ?She knows she has bilateral pneumonia.  She states she was at [facility] for 3 days but left because she was unhappy with their treatment.  Here at [facility] she was initially on heated high flow oxygen for 3 days and maxed out at 15 L/min.  She has since been able to come down to 10 L/min as of last night.  She does wear BiPAP still at night.  Overall she feels she is "doing way better" and would estimates she is about 50% better compared to yesterday. ? ?At baseline she is on 5 L/min nasal cannula oxygen which she states was started by her neurologist for sleep issues.  She does see pulmonary for the past year, Dr. [name] in [place]. ? ?Life Review: ?She is not married and has no children.  She did have a son previously, however he was shot and killed in 2016.  She has struggled with this since then and she credits [name] (significant other) as helping her in the process.  She has a boyfriend/significant other [name]) whom she  confirms to me that she would like to be her surrogate decision maker in case she is unable to make her decisions.  She states they have talked about this already.  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  In her free time she enjoys watching TV and playing video games, specifically PlayStation 5.  She expressed an interest in wanting to exercise.  Her boyfriend goes to [employer] and she would like to join him.  I recommended she discuss with pulmonary before engaging in any structured physical activity to ensure safety given her chronic lung issues. ? ?Patient Values: ?Family, friends (specifically her significant other [name] and his brother Roderic Palau), independence, remaining out of the hospital is much as possible ? ?Goals: ?Get better and get home by Wednesday, stay out of the hospital is much as possible, live her life as fully as possible. ? ?Today's Discussion: ?In addition to the discussion above about her current understanding and life review, we had further substantial conversation on multiple topics. ? ?We had a discussion about her chronic health issues including chronic bronchitis/COPD, chronic respiratory failure oxygen dependent, acute on chronic respiratory failure requiring BiPAP and increased oxygen needs, multifocal pneumonia/sepsis.  We discussed that clinically she appears to be doing better.  We celebrate the fact that she is getting better.  She notes that she has had pneumonia frequently in the past.  We discussed with her chronic issues, specifically her lung issues, that she is at high risk for recurrent pneumonia and other respiratory infections. ? ?We discussed that she was previously told that she should not be on an ACE inhibitor per her pulmonologist, however this was restarted by primary care in addition to Lasix which she feels contributed to her current admission.  She states that she is on home oxygen for sleep desaturations, although she does state that she is on it 24 hours a day.  We had a discussion about COPD as an umbrella term for multiple issues including chronic bronchitis, emphysema, asthma, etc. ? ?She admits that she had a disagreement with the hospitalist yesterday and wanted to leave AMA.  After discussing with her boyfriend and her nurse she agreed to stay until Wednesday. ? ?She notes that she is on 40 mg of Lasix at home and has  not gotten this in several days.  We discussed there may be possible reasons for holding the Lasix but that I would bring it up with the hospitalist.  She notes that she is having some lower leg swelling and at the time of my visit she did have her legs propped on a chair to help. ? ?Review of Systems  ?Constitutional:   ?     Feels better overall  ?Respiratory:  Positive for cough and shortness of breath (much better).   ?Cardiovascular:  Positive for leg swelling. Negative for chest pain.  ?Gastrointestinal:  Negative for abdominal pain, nausea and vomiting.  ? ?Objective:  ? ?Primary Diagnoses: ?Present on Admission: ?? Gastroesophageal reflux disease without esophagitis ?? Essential hypertension ? ? ?Physical Exam ?Vitals and nursing note reviewed.  ?Constitutional:   ?   General: She is not in acute distress. ?   Appearance: She is obese. She is ill-appearing. She is not toxic-appearing.  ?HENT:  ?   Head: Normocephalic and atraumatic.  ?Cardiovascular:  ?   Rate and Rhythm: Normal rate.  ?  Pulmonary:  ?   Effort: Pulmonary effort is normal. No accessory muscle usage or respiratory distress.  ?   Breath sounds: Wheezing and rhonchi (scattered) present.  ?   Comments: 93% on 10 lpm [place] ?Abdominal:  ?   General: Abdomen is protuberant.  ?   Palpations: Abdomen is soft.  ?   Tenderness: There is no abdominal tenderness.  ?Skin: ?   General: Skin is warm and dry.  ?Neurological:  ?   General: No focal deficit present.  ?   Mental Status: She is alert.  ?Psychiatric:     ?   Mood and Affect: Mood normal.     ?   Behavior: Behavior normal.  ? ? ?Vital Signs:  ?BP (!) 156/65   Pulse 99   Temp 98.1 ?F (36.7 ?C) (Oral)   Resp (!) 23   Ht _0  (1.6 m)   Wt 102.9 kg   SpO2 93%   BMI 40.19 kg/m?  ? ?Palliative Assessment/Data: 60-70% ? ? ? ?Advanced Care Planning:  ? ?Primary Decision Maker: ?PATIENT ? ?Code Status/Advance Care Planning: ?Full code ? ?A discussion was had today regarding advanced directives. Concepts  specific to code status, artifical feeding and hydration, continued IV antibiotics and rehospitalization was had.  The difference between a aggressive medical intervention path and a palliative comfort care pat

## 2022-02-22 ENCOUNTER — Inpatient Hospital Stay (HOSPITAL_COMMUNITY): Payer: 59

## 2022-02-22 DIAGNOSIS — J189 Pneumonia, unspecified organism: Secondary | ICD-10-CM | POA: Diagnosis not present

## 2022-02-22 LAB — BASIC METABOLIC PANEL
Anion gap: 9 (ref 5–15)
BUN: 18 mg/dL (ref 6–20)
CO2: 33 mmol/L — ABNORMAL HIGH (ref 22–32)
Calcium: 9 mg/dL (ref 8.9–10.3)
Chloride: 97 mmol/L — ABNORMAL LOW (ref 98–111)
Creatinine, Ser: 0.46 mg/dL (ref 0.44–1.00)
GFR, Estimated: >60 mL/min (ref >60)
Glucose, Bld: 198 mg/dL — ABNORMAL HIGH (ref 70–99)
Potassium: 3.7 mmol/L (ref 3.5–5.1)
Sodium: 139 mmol/L (ref 135–145)

## 2022-02-22 LAB — CBC
HCT: 40.7 % (ref 36.0–46.0)
Hemoglobin: 12.9 g/dL (ref 12.0–15.0)
MCH: 30.5 pg (ref 26.0–34.0)
MCHC: 31.7 g/dL (ref 30.0–36.0)
MCV: 96.2 fL (ref 80.0–100.0)
Platelets: 240 10*3/uL (ref 150–400)
RBC: 4.23 MIL/uL (ref 3.87–5.11)
RDW: 16.8 % — ABNORMAL HIGH (ref 11.5–15.5)
WBC: 13.7 10*3/uL — ABNORMAL HIGH (ref 4.0–10.5)
nRBC: 0.8 % — ABNORMAL HIGH (ref 0.0–0.2)

## 2022-02-22 LAB — GLUCOSE, CAPILLARY
Glucose-Capillary: 136 mg/dL — ABNORMAL HIGH (ref 70–99)
Glucose-Capillary: 260 mg/dL — ABNORMAL HIGH (ref 70–99)
Glucose-Capillary: 342 mg/dL — ABNORMAL HIGH (ref 70–99)
Glucose-Capillary: 228 mg/dL — ABNORMAL HIGH (ref 70–99)

## 2022-02-22 LAB — SEDIMENTATION RATE: Sed Rate: 42 mm/hr — ABNORMAL HIGH (ref 0–22)

## 2022-02-22 IMAGING — DX DG CHEST 1V PORT
1 series · 1 of 1 positions shown · non-contrast
Comparison: [DATE]

CLINICAL DATA: Short of breath

EXAM:
PORTABLE CHEST 1 VIEW

[chest ap]
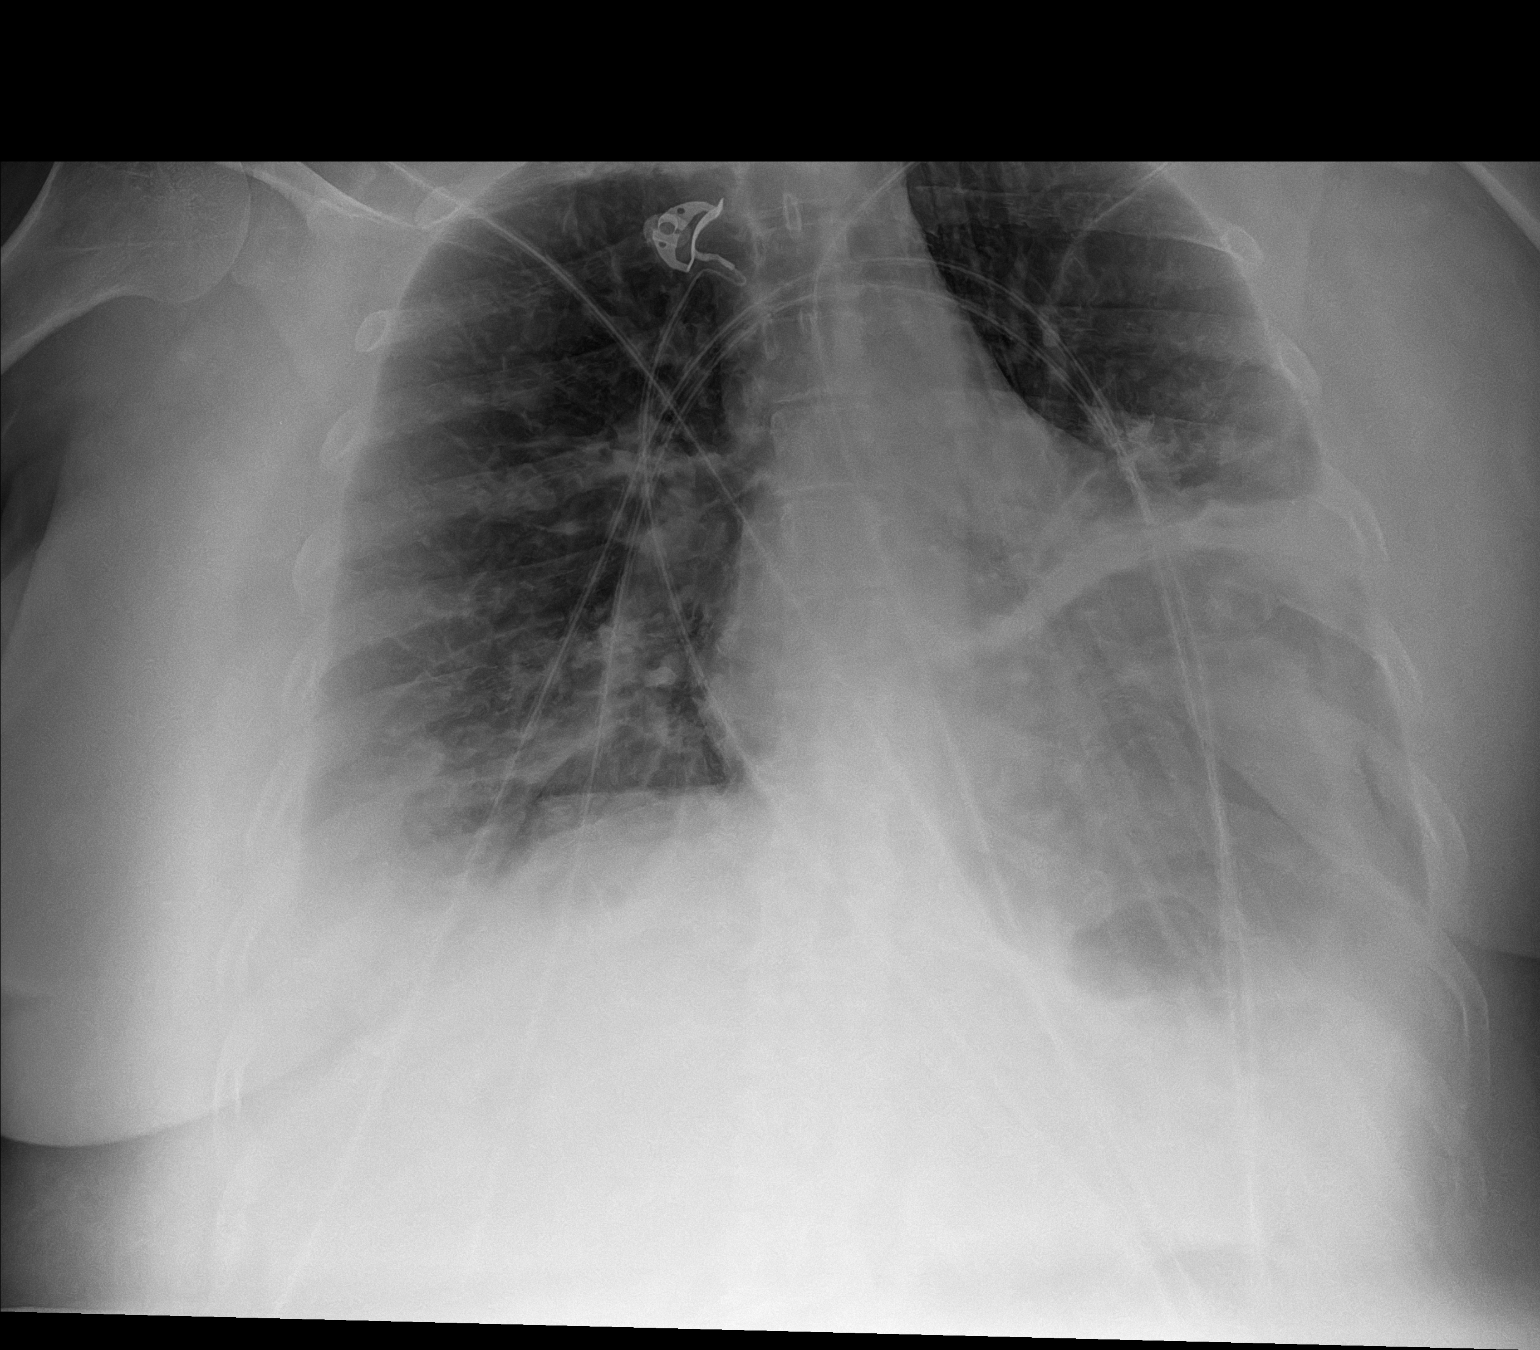

[1 of 1 positions shown; findings below may reference images not displayed]

FINDINGS: Improved aeration in the lung bases. Small right pleural effusion.
Negative for edema.

Progression of left lower lobe airspace disease with horizontal
linear component. Small left effusion
IMPRESSION: Improved aeration in the lung bases.  Small right effusion.

Mild progression of left lower lobe airspace disease and small left
effusion.

## 2022-02-22 MED ORDER — FUROSEMIDE 40 MG PO TABS
40.0000 mg | ORAL_TABLET | Freq: Every day | ORAL | Status: DC
  Administered 2022-02-22: 40 mg via ORAL
  Filled 2022-02-22: qty 1

## 2022-02-22 MED ORDER — OXYCODONE HCL 5 MG PO TABS
5.0000 mg | ORAL_TABLET | Freq: Four times a day (QID) | ORAL | Status: DC | PRN
  Administered 2022-02-22 (×2): 5 mg via ORAL
  Filled 2022-02-22 (×2): qty 1

## 2022-02-22 NOTE — Progress Notes (Signed)
Per Dr Thomes Dinning ok to obtain Portable chest xray  ?

## 2022-02-22 NOTE — Progress Notes (Signed)
Pt decreased to 8lpm HFNC per patient spo2 89% ?

## 2022-02-22 NOTE — Progress Notes (Signed)
Chest x-ray resulted, patient requesting home meds as ordered lasix & percocet, MD notified  ?

## 2022-02-22 NOTE — Progress Notes (Signed)
?PROGRESS NOTE ? ? ?Beverly Tran, is a 51 y.o. female, DOB - [birth year], IWP:[id number] ? ?Admit date - 02/16/2022   Admitting Physician Frankey Shown, DO ? ?Outpatient Primary MD for the [facility] is [facility] ? ?LOS - 6 ? ?Chief Complaint  ?Patient presents with  ? Shortness of Breath  ?    ? ?Brief Narrative:  ?51 y.o. female with medical history significant of type 2 diabetes mellitus, chronic respiratory failure with hypoxia and hypercapnia on supplemental oxygen at 5 LPM, hypertension, hyperlipidemia, diabetic neuropathy, GERD, COPD (chronic bronchitis), RLS admitted on 02/16/2022 with sepsis secondary to multifocal pneumonia resulting in acute on chronic hypoxic respiratory failure requiring prolonged BiPAP use  ? ?  ?-Assessment and Plan: ? ?1)Sepsis secondary to Multifocal PNA---- POA ?-MRSA Nasal swab is +ve ?-Blood and urine cultures from 02/16/2022 NGTD ?-Flu and COVID-negative ?Procalcitonin 4.15 ?-Strep pneumo and Legionella antigen negative ?-- discontinued IV vancomycin and azithromycin after 02/21/2022 dose ?-Continue IV Rocephin  ?--Continue bronchodilators mucolytics and supplemental oxygen ?-Repeat chest x-ray from 02/22/2022 consistent with bilateral pneumonia, with slowly improving aeration ?-Leukocytosis may persist due to steroid therapy ? ?2)Acute on chronic Hypoxic respiratory failure secondary to 1 above ?-PTA at baseline patient was using 5 L of oxygen via nasal cannula ?-Continues to use BiPAP nightly ?-Weaned down to 10 L of oxygen via nasal cannula at this time during the day ? ?3)Social/Ethics--- patient is not legally married, she apparently has no siblings and no kids ?-She would like her boyfriend Mr. Rejeana Brock to be her decision maker in the event that she can longer make decisions for herself ?-Patient wishes to be a full code ?02/22/22- ?-Oxygen requirement improving currently down to 10 L of oxygen via nasal cannula ?-Palliative care and pulmonary  consult from Dr. Sherene Sires noted ?-Patient remains a full code ? ?4)Morbid Obesity-/possible OSA ?-Low calorie diet, portion control and increase physical activity discussed with patient ?-Continue BiPAP nightly and as needed during the day ?-Body mass index is 40.5 kg/m?. ? ? ?5)HTN--continue Avapro, and amlodipine ?-Labetalol as needed ? ?6) chronic pain/restless leg syndrome----continue Lyrica, oxycodone and Mirapex ? ?7)HLD--continue Crestor ? ?8)DM2-A1c 7.0 reflecting fair diabetic control ?-Hold metformin, hold Actos ?-Patient has developed steroid-induced hyperglycemia ?-Continue Levemir 20 units twice daily ?Use Novolog/Humalog Sliding scale insulin with Accu-Cheks/Fingersticks as ordered  ? ?9)Presumed COPD----patient quit smoking around 2018 ?- continue steroids, along with bronchodilators and mucolytics ?-Antibiotics as above #1 ? ? ?Disposition/Need for in-Hospital Stay- patient unable to be discharged at this time due to =--acute on chronic hypoxic respiratory failure secondary to sepsis with pneumonia requiring prolonged BiPAP use IV antibiotics and IV steroids ?-Possible discharge in a couple of days if continues to improve patient has oxygen at 5 L per nasal cannula at home, currently requiring 10 L of oxygen via nasal cannula ? ?status is: Inpatient  ? ?Disposition: The patient is from: Home ?             Anticipated d/c is to: Home ?             Anticipated d/c date is: 2 days ?             Patient currently is not medically stable to d/c. ?Barriers: Not Clinically Stable-  ? ?Code Status :  -  Code Status: Full Code  ? ?Family Communication:    (patient is alert, awake and coherent)  ?Discussed with s/o Mr Rejeana Brock -[phone] ? ?DVT Prophylaxis  :   -  SCDs  SCDs Start: 02/16/22 2141 ? ? ?Lab Results  ?Component Value Date  ? PLT 240 02/22/2022  ? ?Inpatient Medications ? ?Scheduled Meds: ? amLODipine  10 mg Oral Daily  ? Chlorhexidine Gluconate Cloth  6 each Topical Daily  ?  dextromethorphan-guaiFENesin  1 tablet Oral BID  ? enoxaparin (LOVENOX) injection  50 mg Subcutaneous QHS  ? feeding supplement (GLUCERNA SHAKE)  237 mL Oral TID BM  ? furosemide  40 mg Oral Daily  ? insulin aspart  0-20 Units Subcutaneous TID WC  ? insulin aspart  0-5 Units Subcutaneous QHS  ? insulin aspart  4 Units Subcutaneous TID WC  ? insulin detemir  20 Units Subcutaneous BID  ? ipratropium-albuterol  3 mL Nebulization Q6H  ? irbesartan  150 mg Oral Daily  ? mouth rinse  15 mL Mouth Rinse q12n4p  ? methylPREDNISolone (SOLU-MEDROL) injection  40 mg Intravenous Q12H  ? montelukast  10 mg Oral Q supper  ? mupirocin ointment   Nasal BID  ? pantoprazole  40 mg Oral BID AC  ? pramipexole  1 mg Oral QHS  ? pregabalin  300 mg Oral BID  ? rosuvastatin  10 mg Oral QHS  ? ?Continuous Infusions: ? ? ?PRN Meds:.acetaminophen **OR** acetaminophen, labetalol, levalbuterol, oxyCODONE ? ? ?Anti-infectives (From admission, onward)  ? ? Start     Dose/Rate Route Frequency Ordered Stop  ? 02/21/22 1230  cefTRIAXone (ROCEPHIN) 2 g in sodium chloride 0.9 % 100 mL IVPB  Status:  Discontinued       ? 2 g ?200 mL/hr over 30 Minutes Intravenous Every 24 hours 02/21/22 1130 02/21/22 1131  ? 02/17/22 2000  vancomycin (VANCOCIN) IVPB 1000 mg/200 mL premix       ? 1,000 mg ?200 mL/hr over 60 Minutes Intravenous Every 12 hours 02/17/22 0755 02/21/22 2055  ? 02/17/22 0845  vancomycin (VANCOREADY) IVPB 2000 mg/400 mL       ? 2,000 mg ?200 mL/hr over 120 Minutes Intravenous  Once 02/17/22 0754 02/17/22 1817  ? 02/16/22 1815  cefTRIAXone (ROCEPHIN) 2 g in sodium chloride 0.9 % 100 mL IVPB       ? 2 g ?200 mL/hr over 30 Minutes Intravenous Every 24 hours 02/16/22 1805 02/21/22 1814  ? 02/16/22 1815  azithromycin (ZITHROMAX) 500 mg in sodium chloride 0.9 % 250 mL IVPB       ? 500 mg ?250 mL/hr over 60 Minutes Intravenous Every 24 hours 02/16/22 1805 02/20/22 2043  ? ?  ? ? Subjective: ?Philippa Sicksriscilla Wernick today has no fevers, no emesis,  No chest  pain,   ? ?-Boyfriend at bedside, ?-Dyspnea on exertion persist ?-Cough improving ? ?Objective: ?Vitals:  ? 02/22/22 0800 02/22/22 0850 02/22/22 0900 02/22/22 1143  ?BP:      ?Pulse: 91 (!) 105 100   ?Resp: (!) 32 (!) 29 (!) 30   ?Temp:    99.6 ?F (37.6 ?C)  ?TempSrc:    Oral  ?SpO2: 90% 92% (!) 89%   ?Weight:      ?Height:      ? ? ?Intake/Output Summary (Last 24 hours) at 02/22/2022 1329 ?Last data filed at 02/21/2022 1955 ?Gross per 24 hour  ?Intake 445 ml  ?Output --  ?Net 445 ml  ? ?Filed Weights  ? 02/19/22 0500 02/21/22 0413 02/22/22 0419  ?Weight: 103.6 kg 102.9 kg 103.7 kg  ? ? ?Physical Exam ?Gen:- Awake Alert, morbidly obese improving conversational dyspnea, significant dyspnea on exertion persist ?HEENT:- [place].AT, No sclera  icterus-- ?Nose- [place] 10L/min ?Neck-Supple Neck,No JVD,.  ?Lungs--slowly improving air movement, no wheezing ?CV- S1, S2 normal, regular  ?Abd-  +ve B.Sounds, Abd Soft, No tenderness, increased truncal adiposity ?Extremity/Skin:- No significant edema, pedal pulses present  ?Psych-affect is appropriate, oriented x3 ?Neuro-generalized weakness, no new focal deficits, no tremors ? ?Data Reviewed: I have personally reviewed following labs and imaging studies ? ?CBC: ?Recent Labs  ?Lab 02/16/22 ?1749 02/17/22 ?2637 02/18/22 ?8588 02/20/22 ?5027 02/22/22 ?0430  ?WBC 19.3* 17.2* 14.9* 11.4* 13.7*  ?NEUTROABS 13.3*  --   --   --   --   ?HGB 13.6 12.7 12.4 12.9 12.9  ?HCT 42.7 40.5 39.6 39.3 40.7  ?MCV 97.7 100.0 100.0 96.6 96.2  ?PLT 202 187 172 160 240  ? ?Basic Metabolic Panel: ?Recent Labs  ?Lab 02/16/22 ?1749 02/17/22 ?7412 02/20/22 ?8786 02/22/22 ?0430  ?NA 142 144 141 139  ?K 2.9* 3.5 3.6 3.7  ?CL 105 109 102 97*  ?CO2 28 26 31  33*  ?GLUCOSE 172* 143* 230* 198*  ?BUN 12 14 19 18   ?CREATININE 0.61 0.50 0.40* 0.46  ?CALCIUM 8.1* 8.2* 9.5 9.0  ?MG  --  2.1  --   --   ?PHOS  --  2.5  --   --   ? ?GFR: ?Estimated Creatinine Clearance: 96.8 mL/min (by C-G formula based on SCr of 0.46 mg/dL). ?Liver  Function Tests: ?Recent Labs  ?Lab 02/16/22 ?1749 02/17/22 ?04/18/22  ?AST 19 14*  ?ALT 18 15  ?ALKPHOS 42 47  ?BILITOT 0.5 0.7  ?PROT 6.7 6.6  ?ALBUMIN 3.2* 3.0*  ? ?Cardiac Enzymes: ?No results for input(s): CKTOTAL, CKMB,

## 2022-02-22 NOTE — Progress Notes (Signed)
Patient stood & walked in place x as instructed per MD, currently on HFNC @ 10L with baseline home O2 [place] @5L , O2 SATs dropped as low as 81% when coughing/walking in place & mostly sustained low at 85%, patient reported that this is normal for her & denied SOB, MD at bedside & aware ?

## 2022-02-23 LAB — CBC
HCT: 39.3 % (ref 36.0–46.0)
Hemoglobin: 12.8 g/dL (ref 12.0–15.0)
MCH: 31.4 pg (ref 26.0–34.0)
MCHC: 32.6 g/dL (ref 30.0–36.0)
MCV: 96.6 fL (ref 80.0–100.0)
Platelets: 265 10*3/uL (ref 150–400)
RBC: 4.07 MIL/uL (ref 3.87–5.11)
RDW: 17 % — ABNORMAL HIGH (ref 11.5–15.5)
WBC: 15.1 10*3/uL — ABNORMAL HIGH (ref 4.0–10.5)
nRBC: 0.3 % — ABNORMAL HIGH (ref 0.0–0.2)

## 2022-02-23 LAB — GLUCOSE, CAPILLARY: Glucose-Capillary: 136 mg/dL — ABNORMAL HIGH (ref 70–99)

## 2022-02-23 MED ORDER — IRBESARTAN 150 MG PO TABS: 150.0000 mg | ORAL_TABLET | Freq: Every day | ORAL | 0 refills | Status: DC

## 2022-02-23 MED ORDER — AMLODIPINE BESYLATE 10 MG PO TABS: 10.0000 mg | ORAL_TABLET | Freq: Every day | ORAL | 0 refills | Status: DC

## 2022-02-23 MED ORDER — PREDNISONE 10 MG PO TABS: ORAL_TABLET | ORAL | 0 refills | Status: DC

## 2022-02-23 NOTE — Progress Notes (Signed)
Patient left AMA @ 10:03AM ?

## 2022-02-23 NOTE — Progress Notes (Signed)
Inpatient Diabetes Program Recommendations ? ?AACE/ADA: New Consensus Statement on Inpatient Glycemic Control (2015) ? ?Target Ranges:  Prepandial:   less than 140 mg/dL ?     Peak postprandial:   less than 180 mg/dL (1-2 hours) ?     Critically ill patients:  140 - 180 mg/dL  ? ?Lab Results  ?Component Value Date  ? GLUCAP 136 (H) 02/23/2022  ? HGBA1C 7.0 (H) 02/16/2022  ? ? ?Review of Glycemic Control ? Latest Reference Range & Units 02/22/22 07:27 02/22/22 11:47 02/22/22 16:27 02/22/22 21:15 02/23/22 07:15  ?Glucose-Capillary 70 - 99 mg/dL 778 (H) 242 (H) 353 (H) 228 (H) 136 (H)  ?(H): Data is abnormally high ? ?Diabetes history: DM2 ?Outpatient Diabetes medications: Actos 45 QD, Metformin 1000 QAM, 500 QPM, Jardiance 10 QD (not taking), Ozempic 1 mg Qweek ?Current orders for Inpatient glycemic control: Levemir 20 BID, 0-20 TID, 0-5 HS, 4 TID, Solumedrol 40 Q12H ? ?Inpatient Diabetes Program Recommendations:   ?Postprandial CBGs continue to be elevated. ?Please consider: ?-Increase Novolog meal coverage to 6 units tid if eats 50% ?Secure chat sent to Dr. Kerry Hough. ? ?Thank you, ?Billy Fischer Eavan Gonterman, RN, MSN, CDE  ?Diabetes Coordinator ?Inpatient Glycemic Control Team ?Team Pager [phone] (8am-5pm) ?02/23/2022 8:48 AM ? ? ? ? ?

## 2022-02-23 NOTE — Progress Notes (Signed)
Patient reported that she's leaving AMA at this time & that her transportation is here to pick her up, spoke with Dr.Wert who is willing to see the patient outpt on Friday 5/12 @ 2pm & that she has to bring her medications, patient made aware & stated "I'll have no problem getting to Dr.Wert's office this Friday and I'll bring all of my medications.", Dr.Memon arrived at bedside & advised patient that she shouldn't leave AMA but patient reported that she's fine & has all the medications she needs at home & signed AMA paper, patient & patient's husband both reported that she has all of her belongings packed & denied any missing belongings ?

## 2022-02-23 NOTE — Discharge Summary (Signed)
Physician Discharge Summary  ?Beverly Tran ALP:[id number] DOB: [birth year] DOA: 02/16/2022 ? ?PCP: [facility] ? ?Admit date: 02/16/2022 ?Discharge date: 02/23/2022 ? ?PATIENT LEFT HOSPITAL AGAINST MEDICAL ADVICE ? ?Recommendations for Outpatient Follow-up:  ?Patient will follow up with Dr. Sherene Sires on 5/12 at 2:00pm ? ? 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  Found to have multifocal pneumonia and required high flow oxygen as well as BiPAP therapy.  She was treated with intravenous antibiotics.  She was also treated with bronchodilators and steroids.  She was seen by pulmonology and was continued on BiPAP nightly.  She did require up to 15 L of oxygen, but was subsequently weaned down to 7 L.  Overall she was feeling better.  She continues to have productive cough.  She was noted to become hypoxic on exertion/ambulation despite wearing 7 to 10 L of oxygen.  The patient was feeling better and was insistent on discharging home.  I evaluated the patient on 5/10 and recommended that she stay in the hospital less than 24 hours to ensure stability of her respiratory status and transitioning steroids to prednisone.  This would allow [place] to monitor her respiratory status with exertion to ensure she does not become significantly hypoxic with movement.  The patient was adamant about going home.  She says she will follow-up with Dr. Sherene Sires on Friday.  She understands the risks of discharging AGAINST MEDICAL ADVICE including worsening respiratory status.  She was advised to return to the hospital if she had any worsening of her symptoms.  I will call in a prednisone taper to her pharmacy.  She says that she already has her other nebulizer treatments and respiratory treatments at home. ? ?Discharge Diagnoses:   ?Principal Problem: ?  Multifocal pneumonia ?Active Problems: ?  Essential hypertension ?  Gastroesophageal reflux disease without esophagitis ?  Sepsis (HCC) ?  Leukocytosis ?  Hyperglycemia due to diabetes mellitus (HCC) ?  Hypokalemia ?  Hypoalbuminemia due to protein-calorie malnutrition (HCC) ?  Lactic acidosis ?  Elevated brain natriuretic peptide (BNP) level ?  Mixed hyperlipidemia ?  Restless leg syndrome ?  Diabetic neuropathy (HCC) ?  COPD with acute exacerbation (HCC) ?  Acute on chronic respiratory failure with hypoxia (HCC) ? ? ? ?Discharge Instructions ? ? ? ? ?Allergies  ?Allergen Reactions  ? Diclofenac Anaphylaxis  ? Neoma Laming Meat] Shortness Of Breath and Swelling  ?  Also Allergic to POPCORN: same reaction  ? Aspirin Other (See Comments)  ?  Nose bleeds  ? Bee Venom Swelling  ?  WASP/HORNET  ? Compazine [Prochlorperazine] Hives  ? Imitrex [Sumatriptan] Hives  ? Lamotrigine Other (See Comments)  ?  confusion  ? Nubain [Nalbuphine Hcl] Swelling and Other (See Comments)  ?  Eye swelling  ? Thorazine [Chlorpromazine] Hives  ? Topamax [Topiramate] Other (See Comments)  ?  Confusion and out of it  ? Voltaren [Diclofenac Sodium] Swelling  ? Zofran [Ondansetron Hcl] Nausea Only  ? ? ?Consultations: ?Palliative care ?Pulmonology ? ? ?Procedures/Studies: ?CT Angio Chest PE W and/or Wo Contrast ? ?Result Date: 02/16/2022 ?CLINICAL DATA:  Pulmonary embolism (PE) suspected, high prob mulitfocal pneumonia vs pe vs pulmonary edema. EXAM: CT ANGIOGRAPHY CHEST WITH CONTRAST TECHNIQUE: Multidetector CT imaging of the chest was performed using the standard protocol during bolus administration of intravenous contrast. Multiplanar CT  image reconstructions and MIPs were obtained to evaluate the vascular anatomy. RADIATION DOSE REDUCTION: This exam was performed according to the departmental dose-optimization program which includes automated exposure control, adjustment of the mA and/or kV according to patient size  and/or use of iterative reconstruction technique. CONTRAST:  OMNIPAQUE IOHEXOL 350 MG/ML SOLN COMPARISON:  None Available. FINDINGS: Cardiovascular: Satisfactory opacification of the pulmonary arteries to the segmental level. No evidence of pulmonary embolism. Normal cardiac size.No pericardial disease. The thoracic aorta is unremarkable. Mediastinum/Nodes: Prominent mediastinal and hilar lymph nodes, likely reactive. The thyroid is unremarkable. Esophagus is unremarkable. Lungs/Pleura: There is confluent airspace consolidation in the right lower lobe and posterior right upper lobe. There is multifocal airspace consolidation in the left lower lobe and lingula. Central airways are clear. No pleural effusion. No pneumothorax. Upper Abdomen: Hepatic steatosis.  No acute abnormality. Musculoskeletal: No chest wall abnormality. No acute or significant osseous findings. Review of the MIP images confirms the above findings. IMPRESSION: Severe bilateral multifocal pneumonia, most confluent in the right lower lobe. Recommend CT follow-up to resolution. No evidence of pulmonary embolism. Electronically Signed   By: Caprice Renshaw M.D.   On: 02/16/2022 21:45  ? ?DG Chest Port 1 View ? ?Result Date: 02/22/2022 ?CLINICAL DATA:  Short of breath EXAM: PORTABLE CHEST 1 VIEW COMPARISON:  02/20/2022 FINDINGS: Improved aeration in the lung bases. Small right pleural effusion. Negative for edema. Progression of left lower lobe airspace disease with horizontal linear component. Small left effusion IMPRESSION: Improved aeration in the lung bases.  Small right effusion. Mild progression of left lower lobe airspace disease and small left effusion. Electronically Signed   By: Marlan Palau M.D.   On: 02/22/2022 09:06  ? ?DG CHEST PORT 1 VIEW ? ?Result Date: 02/20/2022 ?CLINICAL DATA:  Dyspnea. EXAM: PORTABLE CHEST 1 VIEW COMPARISON:  Chest x-ray 02/16/2022. FINDINGS: There are increasing airspace opacities in the right lung base. Multifocal  airspace opacities in the left mid and lower lung have slightly decreased. There is no pleural effusion or pneumothorax. The heart is enlarged, unchanged. No acute fractures are seen. IMPRESSION: 1. Shifting bilateral airspace disease worrisome for infection. 2. Stable cardiomegaly. Electronically Signed   By: Darliss Cheney M.D.   On: 02/20/2022 03:48  ? ?DG Chest Port 1 View ? ?Result Date: 02/16/2022 ?CLINICAL DATA:  Question sepsis EXAM: PORTABLE CHEST 1 VIEW COMPARISON:  Portable chest 02/15/2022 FINDINGS: Progressive airspace disease left lower lobe, probable pneumonia. Significant improvement in airspace disease in the right lung with small amount of residual right lower lobe infiltrate and small right effusion. Pulmonary vascularity normal. IMPRESSION: Progression of left lower lobe infiltrate probable pneumonia Significant improvement in right lung infiltrate. Electronically Signed   By: Marlan Palau M.D.   On: 02/16/2022 18:22  ? ?ECHOCARDIOGRAM COMPLETE ? ?Result Date: 02/17/2022 ?   ECHOCARDIOGRAM REPORT   Patient Name:   LAMIYAH SCHLOTTER Date of Exam: 02/17/2022 Medical Rec #:  [id number]           Height:       63.0 in Accession #:    [id number]          Weight:       226.9 lb Date of Birth:  1971-05-20           BSA:          2.040 m? Patient Age:    51 years            BP:  127/77 mmHg Patient Gender: F                   HR:           106 bpm. [facility] Procedure: 2D Echo, Cardiac Doppler and Color Doppler Indications:    CHF  History:        Patient has no prior history of Echocardiogram examinations.                 COPD; Risk Factors:Hypertension, Former Smoker and Dyslipidemia.  Sonographer:    Jeryl ColumbiaJohanna Elliott RDCS Referring Phys: [id number] OLADAPO ADEFESO IMPRESSIONS  1. Left ventricular ejection fraction, by estimation, is 60 to 65%. The left ventricle has normal function. The left ventricle has no regional wall motion abnormalities. Left ventricular diastolic parameters are  indeterminate.  2. Right ventricular systolic function is normal. The right ventricular size is normal. Tricuspid regurgitation signal is inadequate for assessing PA pressure.  3. The mitral valve is nor

## 2022-02-23 NOTE — Plan of Care (Signed)
Patient left AMA.

## 2022-02-23 NOTE — Progress Notes (Signed)
Patient currently packing her belongings & reported that she is planning on leaving AMA, MDs made aware  ?

## 2022-02-24 ENCOUNTER — Encounter (HOSPITAL_COMMUNITY): Payer: Self-pay | Admitting: *Deleted

## 2022-02-24 ENCOUNTER — Emergency Department (HOSPITAL_COMMUNITY): Payer: 59

## 2022-02-24 ENCOUNTER — Other Ambulatory Visit: Payer: Self-pay

## 2022-02-24 ENCOUNTER — Inpatient Hospital Stay (HOSPITAL_COMMUNITY)
Admission: EM | Admit: 2022-02-24 | Discharge: 2022-03-04 | DRG: 871 | Disposition: A | Discharge status: Home / Self Care | Payer: 59 | Attending: Internal Medicine | Admitting: Internal Medicine

## 2022-02-24 DIAGNOSIS — J9811 Atelectasis: Secondary | ICD-10-CM | POA: Diagnosis present

## 2022-02-24 DIAGNOSIS — Z9981 Dependence on supplemental oxygen: Secondary | ICD-10-CM

## 2022-02-24 DIAGNOSIS — I110 Hypertensive heart disease with heart failure: Secondary | ICD-10-CM | POA: Diagnosis present

## 2022-02-24 DIAGNOSIS — A4102 Sepsis due to Methicillin resistant Staphylococcus aureus: Secondary | ICD-10-CM | POA: Diagnosis present

## 2022-02-24 DIAGNOSIS — E669 Obesity, unspecified: Secondary | ICD-10-CM | POA: Diagnosis present

## 2022-02-24 DIAGNOSIS — J440 Chronic obstructive pulmonary disease with acute lower respiratory infection: Secondary | ICD-10-CM | POA: Diagnosis present

## 2022-02-24 DIAGNOSIS — E873 Alkalosis: Secondary | ICD-10-CM | POA: Diagnosis present

## 2022-02-24 DIAGNOSIS — I10 Essential (primary) hypertension: Secondary | ICD-10-CM | POA: Diagnosis not present

## 2022-02-24 DIAGNOSIS — J441 Chronic obstructive pulmonary disease with (acute) exacerbation: Secondary | ICD-10-CM | POA: Diagnosis present

## 2022-02-24 DIAGNOSIS — F32A Depression, unspecified: Secondary | ICD-10-CM | POA: Diagnosis present

## 2022-02-24 DIAGNOSIS — J9622 Acute and chronic respiratory failure with hypercapnia: Secondary | ICD-10-CM | POA: Diagnosis not present

## 2022-02-24 DIAGNOSIS — G2581 Restless legs syndrome: Secondary | ICD-10-CM | POA: Diagnosis present

## 2022-02-24 DIAGNOSIS — E1169 Type 2 diabetes mellitus with other specified complication: Secondary | ICD-10-CM | POA: Diagnosis present

## 2022-02-24 DIAGNOSIS — Y95 Nosocomial condition: Secondary | ICD-10-CM | POA: Diagnosis present

## 2022-02-24 DIAGNOSIS — Z7984 Long term (current) use of oral hypoglycemic drugs: Secondary | ICD-10-CM

## 2022-02-24 DIAGNOSIS — R7881 Bacteremia: Secondary | ICD-10-CM

## 2022-02-24 DIAGNOSIS — J869 Pyothorax without fistula: Secondary | ICD-10-CM | POA: Diagnosis present

## 2022-02-24 DIAGNOSIS — J9621 Acute and chronic respiratory failure with hypoxia: Secondary | ICD-10-CM | POA: Diagnosis present

## 2022-02-24 DIAGNOSIS — Z5181 Encounter for therapeutic drug level monitoring: Secondary | ICD-10-CM

## 2022-02-24 DIAGNOSIS — J449 Chronic obstructive pulmonary disease, unspecified: Secondary | ICD-10-CM | POA: Diagnosis not present

## 2022-02-24 DIAGNOSIS — R6520 Severe sepsis without septic shock: Secondary | ICD-10-CM | POA: Diagnosis present

## 2022-02-24 DIAGNOSIS — E1140 Type 2 diabetes mellitus with diabetic neuropathy, unspecified: Secondary | ICD-10-CM | POA: Diagnosis present

## 2022-02-24 DIAGNOSIS — Z7951 Long term (current) use of inhaled steroids: Secondary | ICD-10-CM

## 2022-02-24 DIAGNOSIS — Z91014 Allergy to mammalian meats: Secondary | ICD-10-CM

## 2022-02-24 DIAGNOSIS — J90 Pleural effusion, not elsewhere classified: Secondary | ICD-10-CM | POA: Diagnosis not present

## 2022-02-24 DIAGNOSIS — F419 Anxiety disorder, unspecified: Secondary | ICD-10-CM | POA: Diagnosis present

## 2022-02-24 DIAGNOSIS — E1165 Type 2 diabetes mellitus with hyperglycemia: Secondary | ICD-10-CM | POA: Diagnosis present

## 2022-02-24 DIAGNOSIS — G9341 Metabolic encephalopathy: Secondary | ICD-10-CM | POA: Diagnosis present

## 2022-02-24 DIAGNOSIS — J15212 Pneumonia due to Methicillin resistant Staphylococcus aureus: Secondary | ICD-10-CM | POA: Diagnosis present

## 2022-02-24 DIAGNOSIS — A419 Sepsis, unspecified organism: Secondary | ICD-10-CM | POA: Diagnosis present

## 2022-02-24 DIAGNOSIS — K219 Gastro-esophageal reflux disease without esophagitis: Secondary | ICD-10-CM | POA: Diagnosis present

## 2022-02-24 DIAGNOSIS — Z886 Allergy status to analgesic agent status: Secondary | ICD-10-CM

## 2022-02-24 DIAGNOSIS — B9562 Methicillin resistant Staphylococcus aureus infection as the cause of diseases classified elsewhere: Secondary | ICD-10-CM | POA: Diagnosis not present

## 2022-02-24 DIAGNOSIS — I509 Heart failure, unspecified: Secondary | ICD-10-CM | POA: Diagnosis present

## 2022-02-24 DIAGNOSIS — E785 Hyperlipidemia, unspecified: Secondary | ICD-10-CM | POA: Diagnosis present

## 2022-02-24 DIAGNOSIS — E119 Type 2 diabetes mellitus without complications: Secondary | ICD-10-CM | POA: Diagnosis not present

## 2022-02-24 DIAGNOSIS — D509 Iron deficiency anemia, unspecified: Secondary | ICD-10-CM | POA: Diagnosis present

## 2022-02-24 DIAGNOSIS — Z91018 Allergy to other foods: Secondary | ICD-10-CM

## 2022-02-24 DIAGNOSIS — J189 Pneumonia, unspecified organism: Secondary | ICD-10-CM | POA: Diagnosis present

## 2022-02-24 DIAGNOSIS — J45991 Cough variant asthma: Secondary | ICD-10-CM | POA: Diagnosis present

## 2022-02-24 DIAGNOSIS — E66812 Obesity, class 2: Secondary | ICD-10-CM | POA: Diagnosis present

## 2022-02-24 DIAGNOSIS — J9620 Acute and chronic respiratory failure, unspecified whether with hypoxia or hypercapnia: Secondary | ICD-10-CM | POA: Diagnosis present

## 2022-02-24 DIAGNOSIS — E46 Unspecified protein-calorie malnutrition: Secondary | ICD-10-CM | POA: Diagnosis present

## 2022-02-24 DIAGNOSIS — E782 Mixed hyperlipidemia: Secondary | ICD-10-CM | POA: Diagnosis present

## 2022-02-24 DIAGNOSIS — Z87891 Personal history of nicotine dependence: Secondary | ICD-10-CM

## 2022-02-24 DIAGNOSIS — Z79899 Other long term (current) drug therapy: Secondary | ICD-10-CM

## 2022-02-24 DIAGNOSIS — E876 Hypokalemia: Secondary | ICD-10-CM | POA: Diagnosis present

## 2022-02-24 DIAGNOSIS — Z6836 Body mass index (BMI) 36.0-36.9, adult: Secondary | ICD-10-CM

## 2022-02-24 DIAGNOSIS — Z91030 Bee allergy status: Secondary | ICD-10-CM

## 2022-02-24 DIAGNOSIS — Z888 Allergy status to other drugs, medicaments and biological substances status: Secondary | ICD-10-CM

## 2022-02-24 HISTORY — DX: Type 2 diabetes mellitus without complications: E11.9

## 2022-02-24 LAB — COMPREHENSIVE METABOLIC PANEL
ALT: 26 U/L (ref 0–44)
AST: 17 U/L (ref 15–41)
Albumin: 2.8 g/dL — ABNORMAL LOW (ref 3.5–5.0)
Alkaline Phosphatase: 48 U/L (ref 38–126)
Anion gap: 9 (ref 5–15)
BUN: 11 mg/dL (ref 6–20)
CO2: 35 mmol/L — ABNORMAL HIGH (ref 22–32)
Calcium: 8.4 mg/dL — ABNORMAL LOW (ref 8.9–10.3)
Chloride: 91 mmol/L — ABNORMAL LOW (ref 98–111)
Creatinine, Ser: 0.4 mg/dL — ABNORMAL LOW (ref 0.44–1.00)
GFR, Estimated: >60 mL/min (ref >60)
Glucose, Bld: 144 mg/dL — ABNORMAL HIGH (ref 70–99)
Potassium: 3.7 mmol/L (ref 3.5–5.1)
Sodium: 135 mmol/L (ref 135–145)
Total Bilirubin: 0.7 mg/dL (ref 0.3–1.2)
Total Protein: 6.4 g/dL — ABNORMAL LOW (ref 6.5–8.1)

## 2022-02-24 LAB — SEDIMENTATION RATE: Sed Rate: 43 mm/hr — ABNORMAL HIGH (ref 0–22)

## 2022-02-24 LAB — BLOOD GAS, ARTERIAL
Acid-Base Excess: 19.3 mmol/L — ABNORMAL HIGH (ref 0.0–2.0)
Bicarbonate: 44.6 mmol/L — ABNORMAL HIGH (ref 20.0–28.0)
Drawn by: 41977
FIO2: 80 %
O2 Saturation: 91.6 %
Patient temperature: 37.6
pCO2 arterial: 52 mmHg — ABNORMAL HIGH (ref 32–48)
pH, Arterial: 7.54 — ABNORMAL HIGH (ref 7.35–7.45)
pO2, Arterial: 64 mmHg — ABNORMAL LOW (ref 83–108)

## 2022-02-24 LAB — CBC WITH DIFFERENTIAL/PLATELET
Abs Immature Granulocytes: 0.34 10*3/uL — ABNORMAL HIGH (ref 0.00–0.07)
Basophils Absolute: 0 10*3/uL (ref 0.0–0.1)
Basophils Relative: 0 %
Eosinophils Absolute: 0.1 10*3/uL (ref 0.0–0.5)
Eosinophils Relative: 1 %
HCT: 39.7 % (ref 36.0–46.0)
Hemoglobin: 12.7 g/dL (ref 12.0–15.0)
Immature Granulocytes: 2 %
Lymphocytes Relative: 11 %
Lymphs Abs: 2.1 10*3/uL (ref 0.7–4.0)
MCH: 30.9 pg (ref 26.0–34.0)
MCHC: 32 g/dL (ref 30.0–36.0)
MCV: 96.6 fL (ref 80.0–100.0)
Monocytes Absolute: 0.9 10*3/uL (ref 0.1–1.0)
Monocytes Relative: 5 %
Neutro Abs: 15.7 10*3/uL — ABNORMAL HIGH (ref 1.7–7.7)
Neutrophils Relative %: 81 %
Platelets: 277 10*3/uL (ref 150–400)
RBC: 4.11 MIL/uL (ref 3.87–5.11)
RDW: 16.9 % — ABNORMAL HIGH (ref 11.5–15.5)
WBC: 19.2 10*3/uL — ABNORMAL HIGH (ref 4.0–10.5)
nRBC: 0.3 % — ABNORMAL HIGH (ref 0.0–0.2)

## 2022-02-24 LAB — BRAIN NATRIURETIC PEPTIDE: B Natriuretic Peptide: 80 pg/mL (ref 0.0–100.0)

## 2022-02-24 LAB — GLUCOSE, CAPILLARY
Glucose-Capillary: 113 mg/dL — ABNORMAL HIGH (ref 70–99)
Glucose-Capillary: 142 mg/dL — ABNORMAL HIGH (ref 70–99)

## 2022-02-24 LAB — PROCALCITONIN: Procalcitonin: <0.1 ng/mL

## 2022-02-24 IMAGING — US US CHEST/MEDIASTINUM
1 series · 4 of 4 positions shown · non-contrast
Comparison: Chest radiograph [DATE]

CLINICAL DATA: Abnormal chest radiograph with RIGHT pleural
effusion, shortness of breath

EXAM:
CHEST ULTRASOUND

[Series 1: us thoracentesis asp pleural space w/img guide · 4 of 4 slices shown]
[im 1/4]
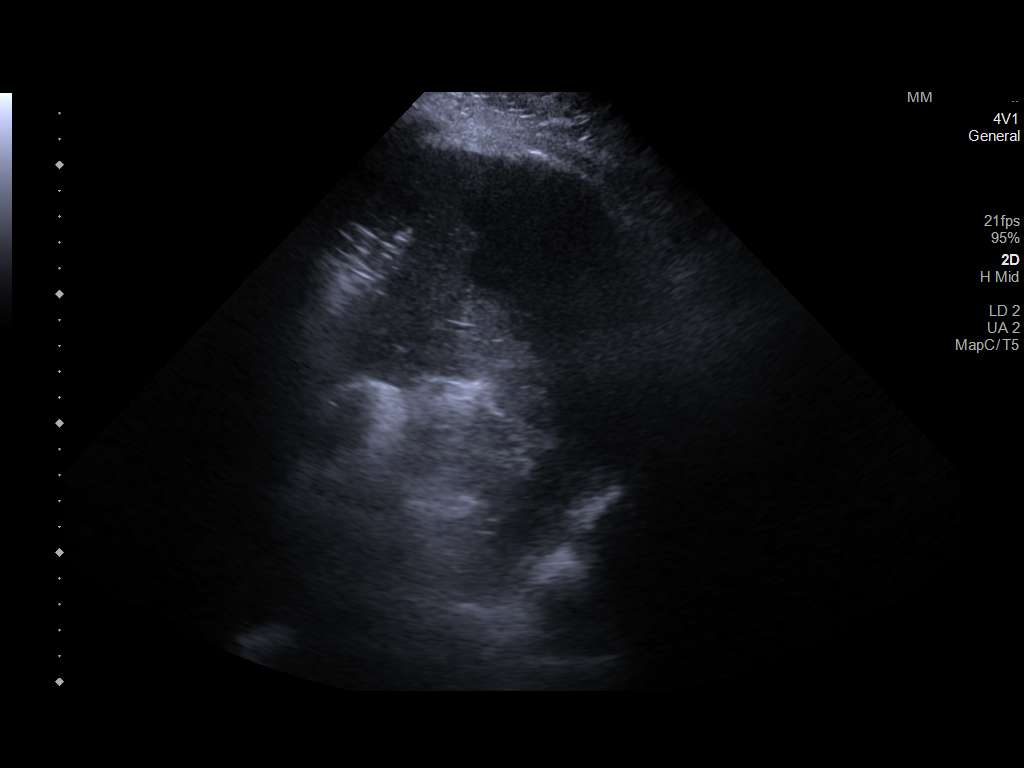
[im 2/4]
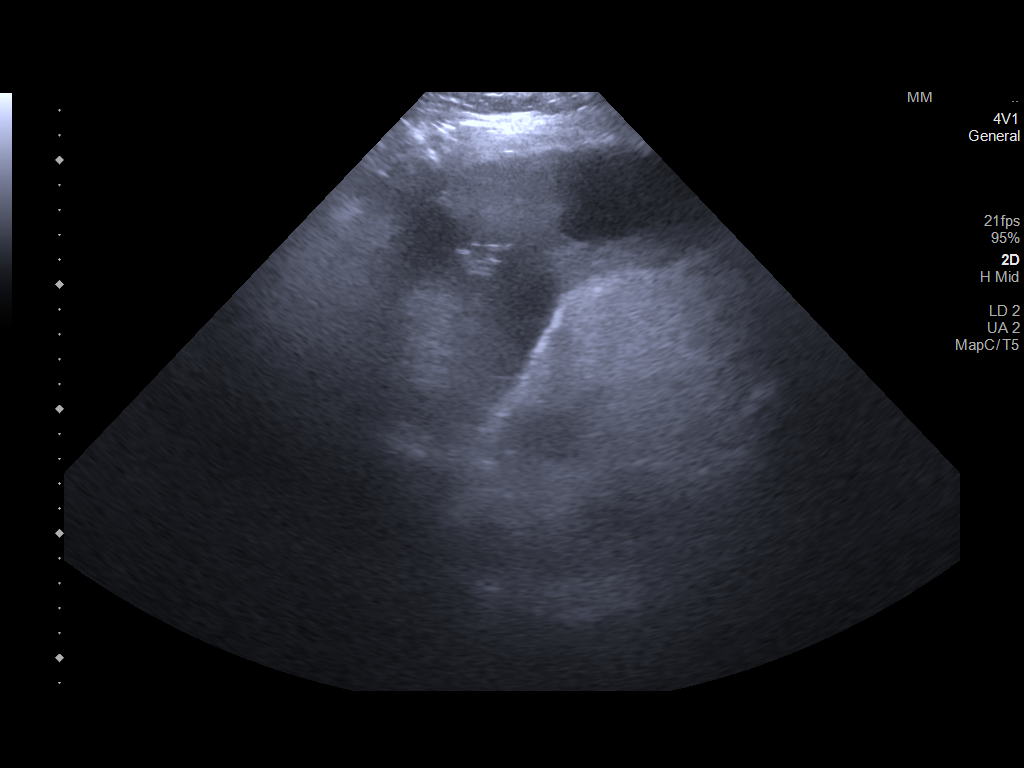
[im 3/4]
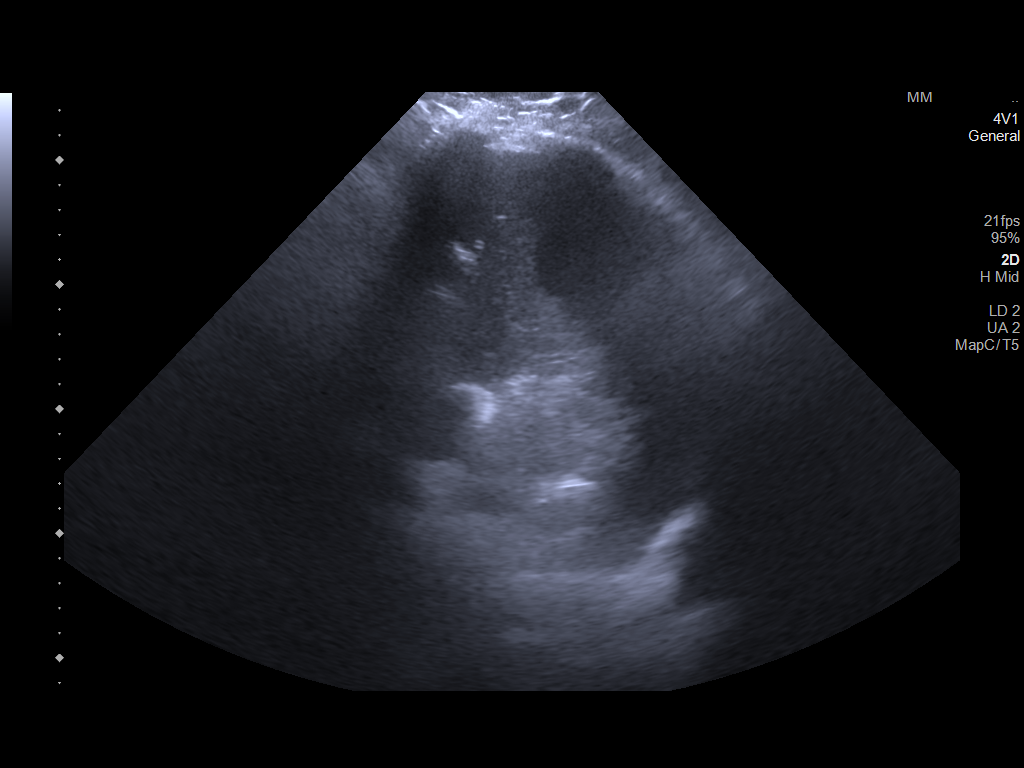
[im 4/4]
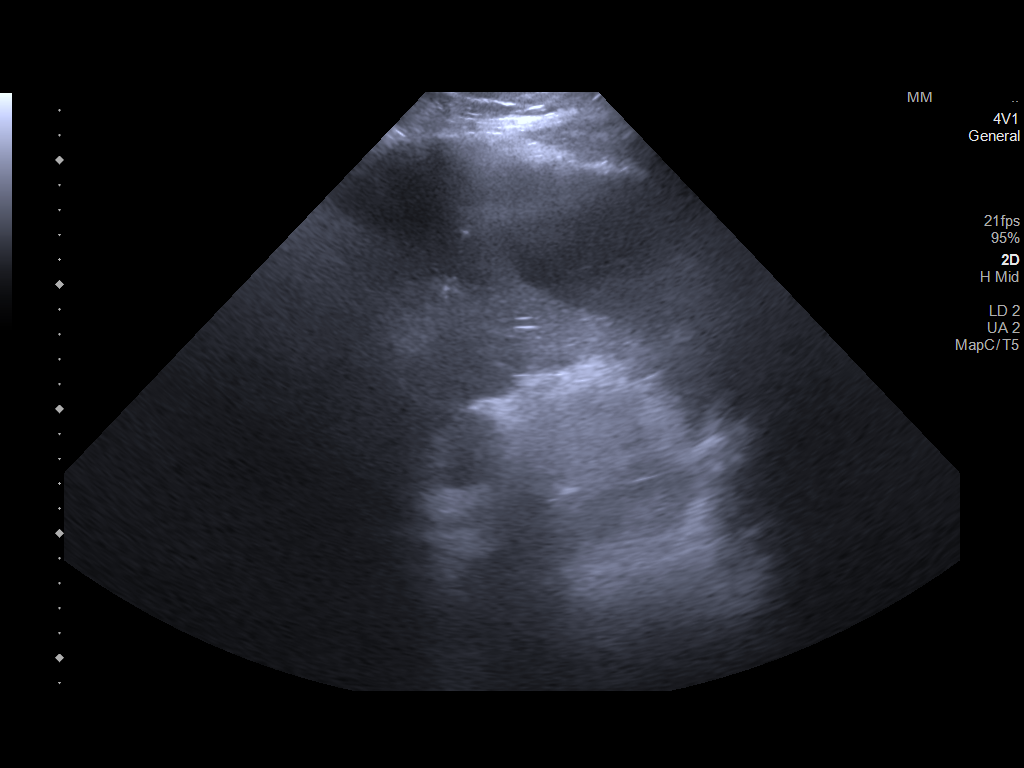

[4 of 4 positions shown; findings below may reference images not displayed]

FINDINGS: Only a small RIGHT pleural effusion is identified, insufficient for
thoracentesis.

Atelectatic versus consolidated is seen floating within the small
pleural effusion.
IMPRESSION: Small RIGHT pleural effusion, insufficient for thoracentesis.

## 2022-02-24 IMAGING — DX DG CHEST 1V PORT
1 series · 1 of 1 positions shown · non-contrast
Comparison: Previous studies including the examination of
[DATE]

CLINICAL DATA: Shortness of breath

EXAM:
PORTABLE CHEST 1 VIEW

[chest ap]
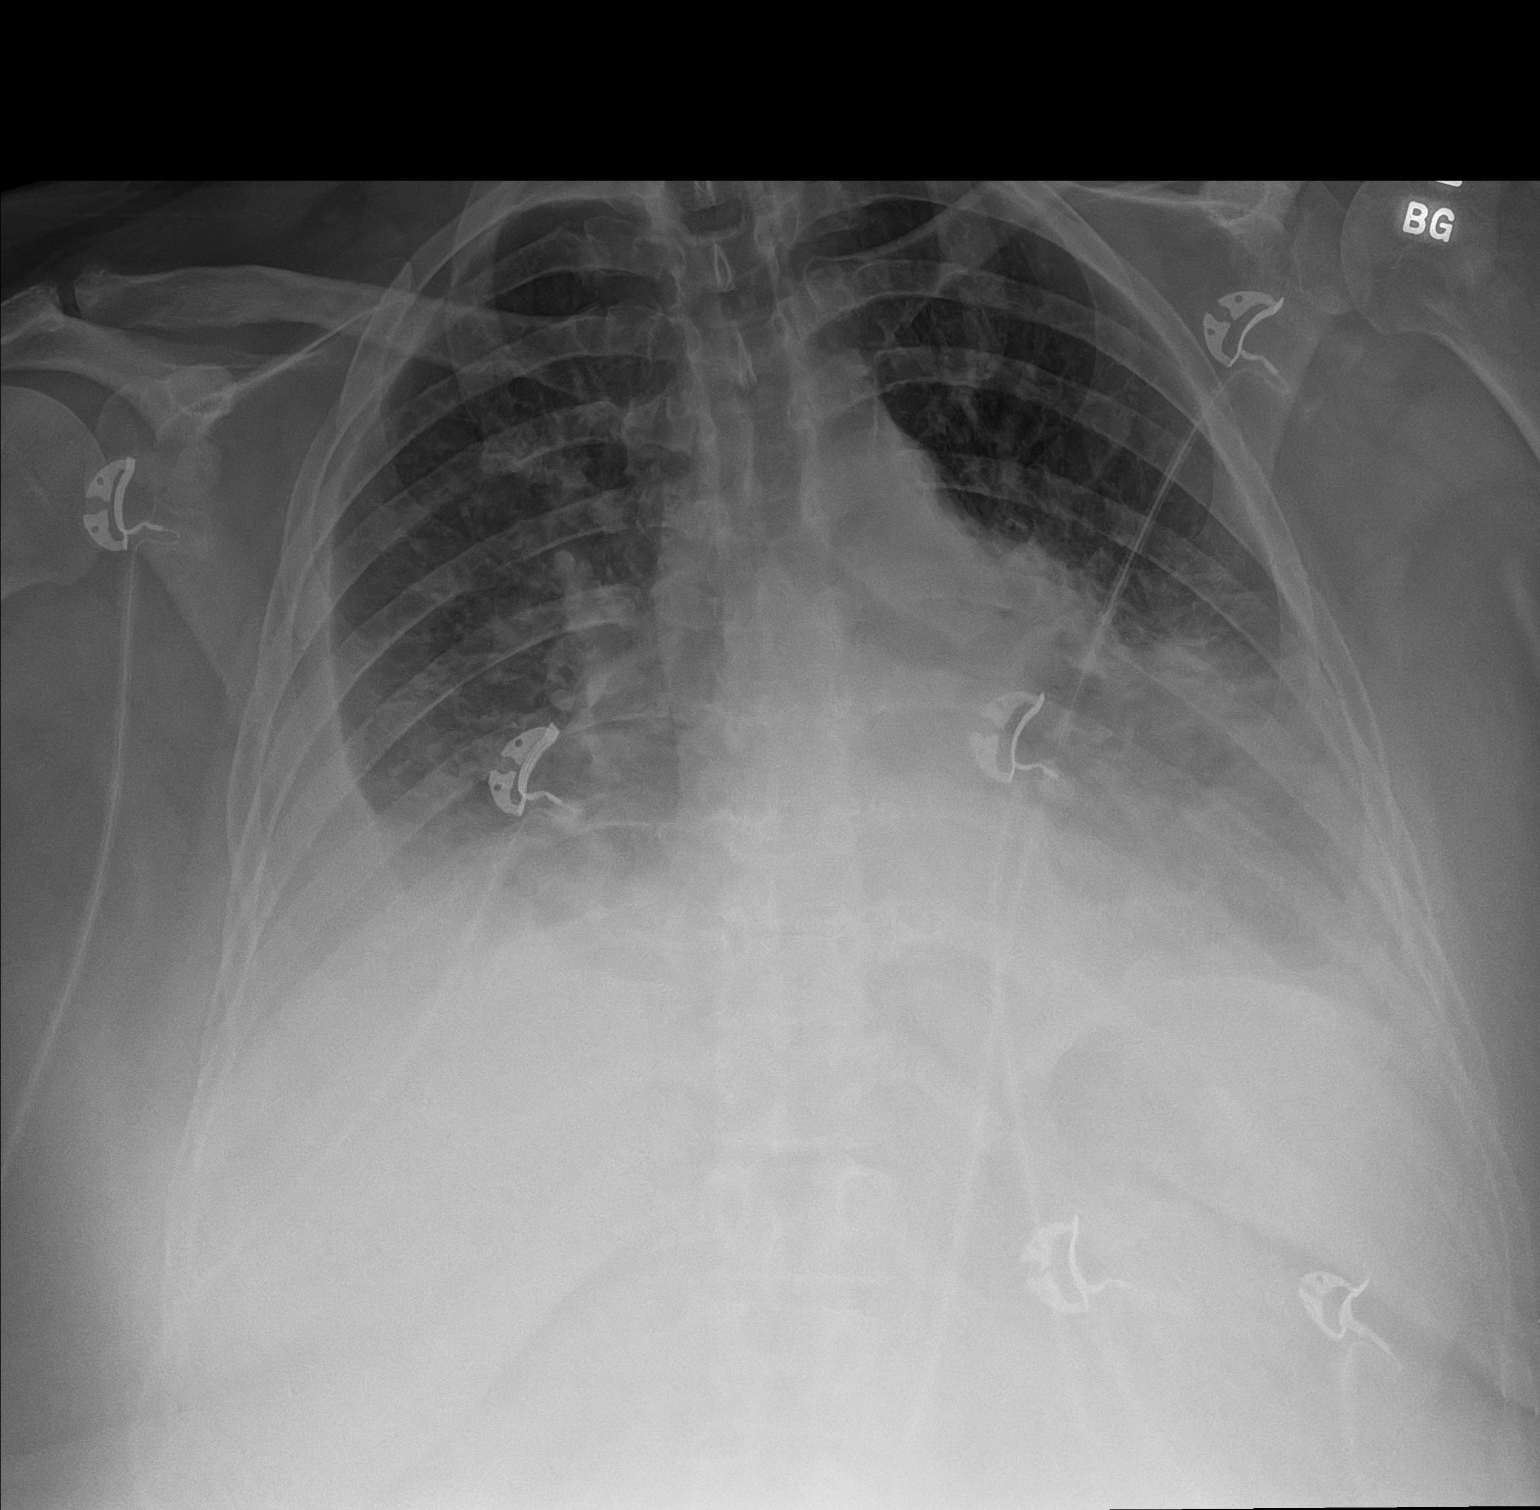

[1 of 1 positions shown; findings below may reference images not displayed]

FINDINGS: Transverse diameter of heart is increased. Central pulmonary vessels
are more prominent. There is moderate right pleural effusion with
interval increase. There is blunting of left lateral CP angle. There
is increased density in the left parahilar region and left lower
lung fields. There is no pneumothorax.
IMPRESSION: There is interval increase in pulmonary vascular congestion
suggesting CHF. There is moderate right pleural effusion with
interval increase. Small left pleural effusion. Increased density in
both lower lung fields may suggest underlying atelectasis/pneumonia.

## 2022-02-24 MED ORDER — CHLORHEXIDINE GLUCONATE CLOTH 2 % EX PADS
6.0000 | MEDICATED_PAD | Freq: Every day | CUTANEOUS | Status: DC
  Administered 2022-02-25 – 2022-03-01 (×5): 6 via TOPICAL

## 2022-02-24 MED ORDER — MUPIROCIN 2 % EX OINT
TOPICAL_OINTMENT | Freq: Two times a day (BID) | CUTANEOUS | Status: DC
  Administered 2022-02-25 – 2022-03-03 (×3): 1 via NASAL
  Filled 2022-02-24 (×5): qty 22

## 2022-02-24 MED ORDER — IPRATROPIUM-ALBUTEROL 0.5-2.5 (3) MG/3ML IN SOLN
3.0000 mL | Freq: Once | RESPIRATORY_TRACT | Status: AC
  Administered 2022-02-24: 3 mL via RESPIRATORY_TRACT
  Filled 2022-02-24: qty 3

## 2022-02-24 MED ORDER — SODIUM CHLORIDE 0.9 % IV SOLN
2.0000 g | Freq: Once | INTRAVENOUS | Status: AC
  Administered 2022-02-24: 2 g via INTRAVENOUS
  Filled 2022-02-24: qty 12.5

## 2022-02-24 MED ORDER — ONDANSETRON HCL 4 MG/2ML IJ SOLN: 4.0000 mg | Freq: Four times a day (QID) | INTRAMUSCULAR | Status: DC | PRN

## 2022-02-24 MED ORDER — HEPARIN SODIUM (PORCINE) 5000 UNIT/ML IJ SOLN
5000.0000 [IU] | Freq: Three times a day (TID) | INTRAMUSCULAR | Status: DC
  Administered 2022-02-24 – 2022-03-03 (×20): 5000 [IU] via SUBCUTANEOUS
  Filled 2022-02-24 (×20): qty 1

## 2022-02-24 MED ORDER — LIDOCAINE HCL (PF) 2 % IJ SOLN
10.0000 mL | Freq: Once | INTRAMUSCULAR | Status: AC
  Administered 2022-02-24: 10 mL

## 2022-02-24 MED ORDER — SODIUM CHLORIDE 0.9 % IV SOLN: 1.0000 g | Freq: Once | INTRAVENOUS | Status: DC

## 2022-02-24 MED ORDER — ONDANSETRON HCL 4 MG PO TABS: 4.0000 mg | ORAL_TABLET | Freq: Four times a day (QID) | ORAL | Status: DC | PRN

## 2022-02-24 MED ORDER — HEPARIN SODIUM (PORCINE) 5000 UNIT/ML IJ SOLN
5000.0000 [IU] | Freq: Three times a day (TID) | INTRAMUSCULAR | Status: DC
  Administered 2022-02-24: 5000 [IU] via SUBCUTANEOUS
  Filled 2022-02-24: qty 1

## 2022-02-24 MED ORDER — SODIUM CHLORIDE 0.9 % IV SOLN
2.0000 g | Freq: Three times a day (TID) | INTRAVENOUS | Status: DC
  Administered 2022-02-24 – 2022-02-28 (×11): 2 g via INTRAVENOUS
  Filled 2022-02-24 (×11): qty 12.5

## 2022-02-24 MED ORDER — BUDESONIDE 0.25 MG/2ML IN SUSP
0.2500 mg | Freq: Two times a day (BID) | RESPIRATORY_TRACT | Status: DC
  Administered 2022-02-24 – 2022-03-04 (×15): 0.25 mg via RESPIRATORY_TRACT
  Filled 2022-02-24 (×16): qty 2

## 2022-02-24 MED ORDER — INSULIN ASPART 100 UNIT/ML IJ SOLN
0.0000 [IU] | INTRAMUSCULAR | Status: DC
  Administered 2022-02-24 – 2022-02-26 (×3): 2 [IU] via SUBCUTANEOUS
  Administered 2022-02-26: 3 [IU] via SUBCUTANEOUS
  Administered 2022-02-26: 2 [IU] via SUBCUTANEOUS
  Administered 2022-02-26: 3 [IU] via SUBCUTANEOUS
  Administered 2022-02-27: 2 [IU] via SUBCUTANEOUS

## 2022-02-24 MED ORDER — IPRATROPIUM-ALBUTEROL 0.5-2.5 (3) MG/3ML IN SOLN
3.0000 mL | Freq: Four times a day (QID) | RESPIRATORY_TRACT | Status: DC
  Administered 2022-02-24 – 2022-03-04 (×28): 3 mL via RESPIRATORY_TRACT
  Filled 2022-02-24 (×24): qty 3
  Filled 2022-02-24: qty 6
  Filled 2022-02-24 (×4): qty 3

## 2022-02-24 MED ORDER — VANCOMYCIN HCL 2000 MG/400ML IV SOLN
2000.0000 mg | Freq: Once | INTRAVENOUS | Status: AC
  Administered 2022-02-24: 2000 mg via INTRAVENOUS

## 2022-02-24 MED ORDER — ACETAMINOPHEN 650 MG RE SUPP
650.0000 mg | RECTAL | Status: DC | PRN
  Administered 2022-02-24 – 2022-02-25 (×3): 650 mg via RECTAL
  Filled 2022-02-24 (×3): qty 1

## 2022-02-24 MED ORDER — PANTOPRAZOLE SODIUM 40 MG IV SOLR
40.0000 mg | INTRAVENOUS | Status: DC
Start: 2022-02-24 — End: 2022-02-26
  Administered 2022-02-24 – 2022-02-25 (×2): 40 mg via INTRAVENOUS
  Filled 2022-02-24 (×2): qty 10

## 2022-02-24 MED ORDER — IPRATROPIUM-ALBUTEROL 0.5-2.5 (3) MG/3ML IN SOLN
RESPIRATORY_TRACT | Status: AC
  Filled 2022-02-24: qty 3

## 2022-02-24 MED ORDER — ARFORMOTEROL TARTRATE 15 MCG/2ML IN NEBU
15.0000 ug | INHALATION_SOLUTION | Freq: Two times a day (BID) | RESPIRATORY_TRACT | Status: DC
  Administered 2022-02-24 – 2022-03-04 (×15): 15 ug via RESPIRATORY_TRACT
  Filled 2022-02-24 (×15): qty 2

## 2022-02-24 MED ORDER — VANCOMYCIN HCL 1500 MG/300ML IV SOLN
1500.0000 mg | INTRAVENOUS | Status: DC
Start: 2022-02-25 — End: 2022-02-28
  Administered 2022-02-25 – 2022-02-27 (×3): 1500 mg via INTRAVENOUS
  Filled 2022-02-24 (×4): qty 300

## 2022-02-24 MED ORDER — ORAL CARE MOUTH RINSE
15.0000 mL | Freq: Two times a day (BID) | OROMUCOSAL | Status: DC
  Administered 2022-02-24 – 2022-03-03 (×12): 15 mL via OROMUCOSAL

## 2022-02-24 MED ORDER — ACETAMINOPHEN 325 MG PO TABS
650.0000 mg | ORAL_TABLET | Freq: Four times a day (QID) | ORAL | Status: DC | PRN
  Administered 2022-02-25 – 2022-02-26 (×4): 650 mg via ORAL
  Filled 2022-02-24 (×4): qty 2

## 2022-02-24 NOTE — Progress Notes (Addendum)
Pharmacy Antibiotic Note ? ?Beverly Tran is a 51 y.o. female admitted on 02/24/2022 with pneumonia.  Pharmacy has been consulted for vancomycin dosing. ? ?Plan: ?Vancomycin 2000 mg IV x 1 dose. ?Vancomycin 1500 mg IV every 24 hours. ?Cefepime 2000 mg IV x 1 dose. F/U additional doses. ?Monitor labs, c/s, and vanco level as indicated. ? ?Height: 5\' 5"  (165.1 cm) ?Weight: 99.3 kg (218 lb 14.7 oz) ?IBW/kg (Calculated) : 57 ? ?Temp (24hrs), Avg:100.3 ?F (37.9 ?C), Min:99.7 ?F (37.6 ?C), Max:101.3 ?F (38.5 ?C) ? ?Recent Labs  ?Lab 02/18/22 ?0639 02/20/22 ?04/22/22 02/22/22 ?0430 02/23/22 ?0352 02/24/22 ?1218  ?WBC 14.9* 11.4* 13.7* 15.1* 19.2*  ?CREATININE  --  0.40* 0.46  --  0.40*  ?  ?Estimated Creatinine Clearance: 98.1 mL/min (A) (by C-G formula based on SCr of 0.4 mg/dL (L)).   ? ?Allergies  ?Allergen Reactions  ? Diclofenac Anaphylaxis  ? 1219 Meat] Shortness Of Breath and Swelling  ?  Also Allergic to POPCORN: same reaction  ? Aspirin Other (See Comments)  ?  Nose bleeds  ? Bee Venom Swelling  ?  WASP/HORNET  ? Compazine [Prochlorperazine] Hives  ? Imitrex [Sumatriptan] Hives  ? Lamotrigine Other (See Comments)  ?  confusion  ? Nubain [Nalbuphine Hcl] Swelling and Other (See Comments)  ?  Eye swelling  ? Thorazine [Chlorpromazine] Hives  ? Topamax [Topiramate] Other (See Comments)  ?  Confusion and out of it  ? Voltaren [Diclofenac Sodium] Swelling  ? Zofran [Ondansetron Hcl] Nausea Only  ? ? ?Antimicrobials this admission: ?Vanco 5/11 >> ?Cefepime 5/11 >>>  ? ? ? ?Microbiology results: ?5/11 Pleural Fluid: pending  ? ? ?Thank you for allowing pharmacy to be a part of this patient?s care. ? ?7/11 ?02/24/2022 3:25 PM ? ?

## 2022-02-24 NOTE — ED Triage Notes (Signed)
Pt brought in by [facility] from home with c/o SOB. Pt left AMA yesterday from [facility] with dx of PNA. Pt's O2 sat 77% on home O2 on 6L. Pt was placed on NRB with increase in O2 sat to 87%. EMS reports lung sounds are very decreased on left side. BP 185/50, HR 85 for EMS.  ?

## 2022-02-24 NOTE — Consult Note (Addendum)
? ?NAME:  Beverly Tran, MRN:  [id number], DOB:  Sep 16, 1971, LOS: 0 ?ADMISSION DATE:  02/24/2022, CONSULTATION DATE:  02/21/22 ?REFERRING MD:  Emokpae/Triad, CHIEF COMPLAINT:  resp distress   ? ?History of Present Illness:  ?51  yowf quit smoking 01/2020 last seen in pulmonary clinic 03/18/21 with dx cough variant asthma vs VCD flared on ACEi and better off acei and on max gerd rx s hypercarbia but using 02 prn and with rec for pfts which were  never done >>>   admitted on 02/16/2022 ? on ACEi again with dx sepsis secondary to multifocal pneumonia resulting in acute on chronic hypoxic respiratory failure requiring prolonged BiPAP use so PCCM consulted 5/8 rec wean bipap/ 02/ rx empirically for CAP but pt left ama once awake enough to refuse bipap ? ?5/11 returned in am to ER with increase sob at rest and not saturating  above 80% on her home flow reported to be 6lpm (not verified, nor clear what meds she's really taking) - c/o midline mid back pain s pleuritic component, some gen ha but no N or V or Chills or abd pain or dysuria.  W/u showed new mod large R effusion so rec to tap in ER ASAP  ? ? ? ?Pertinent  Medical History  ?DM type 2 with diabetic neuropathy ?HBP ?GERD ?RLS  ? ? ?Significant Hospital Events: ?Including procedures, antibiotic start and stop dates in addition to other pertinent events   ?CTa  5/3 Severe bilateral multifocal pneumonia, most confluent in the right lower lobe.  ?Urine strep/ legionella 5/3 neg  ?MRSA PCR  5/4  POS ?Echo 5/4 ok x for RA est pressure 15  with Nl LA size but RA not well viz ?R Tcentesis 5/11 >>>  ? ? ? ? ?Scheduled Meds: ? heparin injection (subcutaneous)  5,000 Units Subcutaneous Q8H  ? ipratropium-albuterol  3 mL Nebulization Q6H  ? pantoprazole (PROTONIX) IV  40 mg Intravenous Q24H  ? ?Continuous Infusions: ? ceFEPime (MAXIPIME) IV    ? vancomycin    ? ?PRN Meds:.  ? ? ?Interim History / Subjective:  ?Worse sob since left ama 5/10 ? ?Objective   ?Blood pressure 127/78,  pulse 96, temperature (!) 101.3 ?F (38.5 ?C), temperature source Axillary, resp. rate (!) 23, height 5\' 5"  (1.651 m), weight 99.3 kg, SpO2 (!) 86 %. ?   ?FiO2 (%):  [80 %-100 %] 80 %  ?No intake or output data in the 24 hours ending 02/24/22 1450 ? ?Filed Weights  ? 02/24/22 1146 02/24/22 1445  ?Weight: 103.1 kg 99.3 kg  ? ? ?Examination: ?Tmax:   99.8  ?General appearance:   acute and chronically ill appearing obese wf on bipap  ?At Rest 02 sats  85% on bipap @ FIO2 80   ?No jvd ?Oropharynx bipap mask ?Neck supple ?Lungs with a few scattered exp > insp rhonchi bilaterally and decreased bs R > L base  ?RRR no s3 or or sign murmur ?Abd obese with poor insp  excursion  ?Extr warm with no edema  but chronic venous changes ?Neuro  Sensorium somewhat somnolent but oriented to person place ,  no apparent motor deficits  ? ? ? ?I personally reviewed images and agree with radiology impression as follows:  ?pCXR  5/11  ?There is interval increase in pulmonary vascular congestion ?suggesting CHF. There is moderate right pleural effusion with ?interval increase. Small left pleural effusion. Increased density in ?both lower lung fields may suggest underlying atelectasis/pneumonia. ? ?  ? ?  Assessment & Plan:  ?1) Acute  hypoxemic and hypercarbic resp failure in setting of multifocal pna with mild increase PCT c/w assoc sepsis and ? ALI (inflammatory) superimposed on infectious infiltrates. ?- HC03  [facility]16/21 [facility] ?>>> rec maxepime/Vanc for now and restart bipap adjust tidal vol to about 500 and keep sats 85-90 % but no higher ? ?2) Underlying UACS/ pseudowheeze and intol to ACEi labeled previously as asthma ?>>> rx gerd/ pfts as outpt with f/l loop may be helpful vs refer to ENT in [place] if proves refractory ? ?3) New R effusion in pt with ? COPLD  ?Note that pleural effusion and copd have the same effect on insp muscles/mechanics (both shorten their length prior to inspiration making them weaker with less force  reserve) so they are synergistic in causing sob ?>>>R pleural  tap 5/11 for dx/ therapeutic purposes ? Needs pigtail catheter depending on finding so [facility] but can wait on that for response/results of tap ?  ? ?4)  Variable mentation related to hypercabia and ? Sepsis ?  ?>>> see vent goals/ check PCT vs priors noting wbc is way up may have been steroid related and would avoid these in the absence of airways dz refractory to duoneb  ? ?5) Protein calorie malnutrition with Alb 2.8 on admit ? ? ?Best Practice (right click and "Reselect all SmartList Selections" daily)  ?  ? ?Labs   ?CBC: ?Recent Labs  ?Lab 02/18/22 ?0639 02/20/22 ?[id number] 02/22/22 ?0430 02/23/22 ?0352 02/24/22 ?1218  ?WBC 14.9* 11.4* 13.7* 15.1* 19.2*  ?NEUTROABS  --   --   --   --  15.7*  ?HGB 12.4 12.9 12.9 12.8 12.7  ?HCT 39.6 39.3 40.7 39.3 39.7  ?MCV 100.0 96.6 96.2 96.6 96.6  ?PLT 172 160 240 265 277  ? ? ?Basic Metabolic Panel: ?Recent Labs  ?Lab 02/20/22 ?[id number] 02/22/22 ?0430 02/24/22 ?1218  ?NA 141 139 135  ?K 3.6 3.7 3.7  ?CL 102 97* 91*  ?CO2 31 33* 35*  ?GLUCOSE 230* 198* 144*  ?BUN 19 18 11   ?CREATININE 0.40* 0.46 0.40*  ?CALCIUM 9.5 9.0 8.4*  ? ?GFR: ?Estimated Creatinine Clearance: 98.1 mL/min (A) (by C-G formula based on SCr of 0.4 mg/dL (L)). ?Recent Labs  ?Lab 02/20/22 ?[id number] 02/22/22 ?0430 02/23/22 ?0352 02/24/22 ?1218  ?WBC 11.4* 13.7* 15.1* 19.2*  ? ? ?Liver Function Tests: ?Recent Labs  ?Lab 02/24/22 ?1218  ?AST 17  ?ALT 26  ?ALKPHOS 48  ?BILITOT 0.7  ?PROT 6.4*  ?ALBUMIN 2.8*  ? ?No results for input(s): LIPASE, AMYLASE in the last 168 hours. ?No results for input(s): AMMONIA in the last 168 hours. ? ?ABG ?   ?Component Value Date/Time  ? PHART 7.54 (H) 02/24/2022 1207  ? PCO2ART 52 (H) 02/24/2022 1207  ? PO2ART 64 (L) 02/24/2022 1207  ? HCO3 44.6 (H) 02/24/2022 1207  ? TCO2 22 08/18/2010 2152  ? O2SAT 91.6 02/24/2022 1207  ?  ? ?Coagulation Profile: ?No results for input(s): INR, PROTIME in the last 168  hours. ? ? ?Cardiac Enzymes: ?No results for input(s): CKTOTAL, CKMB, CKMBINDEX, TROPONINI in the last 168 hours. ? ?HbA1C: ?Hgb A1c MFr Bld  ?Date/Time Value Ref Range Status  ?02/16/2022 05:49 PM 7.0 (H) 4.8 - 5.6 % Final  ?  Comment:  ?  (NOTE) ?Pre diabetes:          5.7%-6.4% ? ?Diabetes:              >  6.4% ? ?Glycemic control for   <7.0% ?adults with diabetes ?  ? ? ?CBG: ?Recent Labs  ?Lab 02/22/22 ?0727 02/22/22 ?1147 02/22/22 ?1627 02/22/22 ?2115 02/23/22 ?0715  ?GLUCAP 136* 260* 342* 228* 136*  ? ? ?   ? The patient is critically ill with multiple organ systems failure and requires high complexity decision making for assessment and support, frequent evaluation and titration of therapies, application of advanced monitoring technologies and extensive interpretation of multiple databases. Critical Care Time devoted to patient care services described in this note is 45 min  minutes. ? ? ?Sandrea Hughs, MD ?Pulmonary and Critical Care Medicine ?[facility] ?Cell [phone]  ? ?After 7:00 pm call Elink  [phone]   ?

## 2022-02-24 NOTE — Progress Notes (Signed)
Patient transported from ED to ICU without any complications. 

## 2022-02-24 NOTE — ED Provider Notes (Signed)
?[place] EMERGENCY DEPARTMENT ?Provider Note ? ? ?CSN: [id number] ?Arrival date & time: 02/24/22  1132 ? ?  ? ?History ? ?Chief Complaint  ?Patient presents with  ? Shortness of Breath  ? ? ?Beverly Tran is a 51 y.o. female. ? ?Patient brought in by EMS from home.  Complaining of shortness of breath.  Patient recently admitted from May 3 to May 10.  Also followed by Dr. Sherene Sires.  Patient left the hospital AGAINST MEDICAL ADVICE yesterday.  From the [facility], ICU.  Diagnosed with COPD exacerbation multifocal pneumonia.  Oxygen sats at home on 6 L was 77%.  Patient placed by EMS on a nonrebreather with increase in sats to 87%.  EMS reported lung sounds very decreased on the left side.  But to me it seems to be more on the right side.  Patient a little bit somnolent though will wake up and nod to questions.  Patient's blood pressure was 185/50 heart rate was 85. ? ?Shortly after arrival got respiratory therapy down.  Reviewed her recent ICU admission patient was on BiPAP when she was admitted then was using BiPAP at night and then high liters of oxygen during the day she did require up to 15 L but was subsequently weaned down to about 5 L.  I did not want her to go home yet.  They wanted to observe her for another 24 hours.  Patient was on steroids while admitted.  And they wanted to transition her to prednisone but she insisted on leaving.  Patient left AGAINST MEDICAL ADVICE.  They have called in a prednisone taper to the pharmacy.  And she already has nebulizer treatments. ? ?Not clear whether patient got the steroids filled.  Or whether she was using any of her treatments.  Patient known to have significant oxygen dependent COPD normally on 5 L of oxygen all the time known to have chronic respiratory failure with hypoxia and hypercapnia hypertension diabetes. ? ? ?  ? ?Home Medications ?Prior to Admission medications   ?Medication Sig Start Date End Date Taking? Authorizing Provider  ?amLODipine (NORVASC)  10 MG tablet Take 1 tablet (10 mg total) by mouth daily. 02/23/22  Yes Erick Blinks, MD  ?Cholecalciferol (VITAMIN D3) 10 MCG (400 UNIT) CAPS Take 400 mg by mouth daily.   Yes [provider]  ?clonazePAM (KLONOPIN) 0.5 MG tablet Take 0.5 mg by mouth 2 (two) times daily as needed. 01/31/22  Yes [provider]  ?DULoxetine (CYMBALTA) 60 MG capsule Take 60 mg by mouth daily. 01/25/22  Yes [provider]  ?EPINEPHrine 0.3 mg/0.3 mL IJ SOAJ injection Inject 0.3 mg into the muscle as directed. 05/11/21  Yes [provider]  ?famotidine (PEPCID) 20 MG tablet TAKE ONE TABLET BY MOUTH AFTER SUPPER. 03/14/21  Yes Oretha Milch, MD  ?FEROSUL 325 (65 Fe) MG tablet Take 325 mg by mouth 2 (two) times daily. 02/07/22  Yes [provider]  ?furosemide (LASIX) 40 MG tablet Take 40 mg by mouth daily.   Yes [provider]  ?gabapentin (NEURONTIN) 600 MG tablet Take 600 mg by mouth 3 (three) times daily. 01/24/22  Yes [provider]  ?hydrOXYzine (ATARAX/VISTARIL) 25 MG tablet Take 25 mg by mouth daily as needed for anxiety. 05/21/21  Yes [provider]  ?ipratropium-albuterol (DUONEB) 0.5-2.5 (3) MG/3ML SOLN Take 3 mLs by nebulization every 4 (four) hours as needed (shortness of breath or wheezing).   Yes [provider]  ?irbesartan (AVAPRO) 150  MG tablet Take 1 tablet (150 mg total) by mouth daily. 02/23/22  Yes Erick BlinksMemon, Jehanzeb, MD  ?levocetirizine (XYZAL) 5 MG tablet Take 10 mg by mouth at bedtime.   Yes [provider]  ?meclizine (ANTIVERT) 25 MG tablet Take 25 mg by mouth 2 (two) times daily as needed for nausea. 01/20/22  Yes [provider]  ?metFORMIN (GLUCOPHAGE) 500 MG tablet Take 500-1,000 mg by mouth See admin instructions. Take 1000 mg by mouth in the morning and 500 mg in the evening   Yes [provider]  ?montelukast (SINGULAIR) 10 MG tablet Take 10 mg by mouth daily with supper.   Yes [provider]   ?naloxone (NARCAN) nasal spray 4 mg/0.1 mL 1 spray once. 08/31/21  Yes [provider]  ?oxyCODONE-acetaminophen (PERCOCET) 10-325 MG tablet Take 1 tablet by mouth 3 (three) times daily as needed for moderate pain.   Yes [provider]  ?OZEMPIC, 1 MG/DOSE, 4 MG/3ML SOPN Inject 1 mg into the skin every 7 (seven) days. 01/25/22  Yes [provider]  ?pantoprazole (PROTONIX) 40 MG tablet TAKE ONE TABLET BY MOUTH DAILY (MORNING) ?Patient taking differently: Take 40 mg by mouth daily. 10/13/21  Yes Nyoka CowdenWert, Michael B, MD  ?pioglitazone (ACTOS) 45 MG tablet Take 45 mg by mouth daily.   Yes [provider]  ?pramipexole (MIRAPEX) 1 MG tablet Take 1 mg by mouth at bedtime.   Yes [provider]  ?promethazine (PHENERGAN) 25 MG tablet TAKE ONE TABLET BY MOUTH EVERY 8 HOURS AS NEEDED FOR NAUSEA AND VOMITING 02/08/22  Yes Gelene MinkBoone, Anna W, NP  ?propranolol (INDERAL) 10 MG tablet Take 10 mg by mouth at bedtime. 02/26/20  Yes [provider]  ?QUEtiapine (SEROQUEL) 50 MG tablet Take 100 mg by mouth at bedtime.   Yes [provider]  ?rosuvastatin (CRESTOR) 10 MG tablet Take 10 mg by mouth at bedtime.   Yes [provider]  ?SYMBICORT 80-4.5 MCG/ACT inhaler Inhale 2 puffs into the lungs daily. 01/31/22  Yes [provider]  ?colestipol (COLESTID) 1 g tablet TAKE TWO (2) TABLETS BY MOUTH TWICE DAILY ?Patient not taking: Reported on 02/17/2022 05/12/21   Gelene MinkBoone, Anna W, NP  ?JARDIANCE 10 MG TABS tablet Take 10 mg by mouth daily. ?Patient not taking: Reported on 02/17/2022 12/10/21   [provider]  ?potassium chloride (KLOR-CON M) 10 MEQ tablet Take 10 mEq by mouth daily. ?Patient not taking: Reported on 02/17/2022 01/31/22   [provider]  ?predniSONE (DELTASONE) 10 MG tablet Take 40mg  po daily for 2 days then 30mg  daily for 2 days then 20mg  daily for 2 days then 10mg  daily for 2 days then stop ?Patient not taking: Reported on 02/24/2022 02/23/22    Erick BlinksMemon, Jehanzeb, MD  ?   ? ?Allergies    ?Diclofenac, Salami [pickled meat], Aspirin, Bee venom, Compazine [prochlorperazine], Imitrex [sumatriptan], Lamotrigine, Nubain [nalbuphine hcl], Thorazine [chlorpromazine], Topamax [topiramate], Voltaren [diclofenac sodium], and Zofran [ondansetron hcl]   ? ?Review of Systems   ?Review of Systems  ?Constitutional:  Positive for fatigue. Negative for chills and fever.  ?HENT:  Negative for ear pain and sore throat.   ?Eyes:  Negative for pain and visual disturbance.  ?Respiratory:  Positive for shortness of breath and wheezing. Negative for cough.   ?Cardiovascular:  Negative for chest pain and palpitations.  ?Gastrointestinal:  Negative for abdominal pain and vomiting.  ?Genitourinary:  Negative for dysuria and hematuria.  ?Musculoskeletal:  Negative for arthralgias and back pain.  ?  Skin:  Negative for color change and rash.  ?Neurological:  Negative for seizures and syncope.  ?Psychiatric/Behavioral:  Positive for decreased concentration.   ?All other systems reviewed and are negative. ? ?Physical Exam ?Updated Vital Signs ?BP 113/69   Pulse 86   Temp 99.7 ?F (37.6 ?C) (Oral)   Resp 12   Ht 1.6 m (5\' 3" )   Wt 103.1 kg   SpO2 (!) 86% Comment: Per MD Wert verbal maintain sats between 85-90%  BMI 40.26 kg/m?  ?Physical Exam ?Vitals and nursing note reviewed.  ?Constitutional:   ?   General: She is not in acute distress. ?   Appearance: She is well-developed. She is not ill-appearing or toxic-appearing.  ?HENT:  ?   Head: Normocephalic and atraumatic.  ?Eyes:  ?   Conjunctiva/sclera: Conjunctivae normal.  ?Cardiovascular:  ?   Rate and Rhythm: Normal rate and regular rhythm.  ?   Heart sounds: No murmur heard. ?Pulmonary:  ?   Effort: Pulmonary effort is normal. No respiratory distress.  ?   Breath sounds: Examination of the right-upper field reveals decreased breath sounds. Examination of the left-upper field reveals wheezing. Examination of the right-middle field  reveals decreased breath sounds. Examination of the left-middle field reveals wheezing. Examination of the right-lower field reveals decreased breath sounds. Examination of the left-lower field reveals wheezing. Dec

## 2022-02-24 NOTE — Progress Notes (Signed)
Report received from ED nurse on patient coming to ICU room # 11 ?

## 2022-02-24 NOTE — H&P (Signed)
?History and Physical  ? ? ?Beverly Tran PYK:[id number] DOB: Jun 26, 1971 DOA: 02/24/2022 ? ?PCP: [facility]  ?Patient coming from: Home ? ?I have personally briefly reviewed patient's old medical records in [facility] ? ?Chief Complaint: Shortness of breath ? ?HPI: Beverly Tran is a 51 y.o. female with medical history significant of chronic respiratory failure on oxygen at 5 L, COPD, diabetes, hypertension, anxiety, who was initially admitted to the hospital on 5/3 with shortness of breath and was being treated for multifocal pneumonia, COPD and worsening respiratory failure.  The patient did require BiPAP at that time as well as high flow nasal cannula up to 15 L.  She was subsequently weaned down to 7 L.  She was insistent on being discharged home and ended up leaving AGAINST MEDICAL ADVICE on 5/10 when it was advised that she was not medically ready for discharge.  She returned to the hospital the following day with complaints of shortness of breath.  Oxygen saturations noted to be 77% on 6 L at home.  EMS placed her on a nonrebreather.  When she was brought to the emergency room, where she was noted to be somnolent.  She was placed on BiPAP.  Initial x-ray indicated possible right-sided pleural effusion.  She was seen by interventional radiology and ultrasound was performed that showed that she in fact had a small pleural effusion with extensive consolidation/atelectasis on that side.  She was noted to be febrile.  She was seen by pulmonology in the emergency room with recommendations to continue BiPAP, antibiotics and nebulizer treatments. ? ? ? ?Review of Systems: Unable to assess since she is unable to participate in history due to respiratory status ? ? ?Past Medical History:  ?Diagnosis Date  ? Anxiety   ? Arthritis   ? Asthma   ? Back pain, chronic   ? Depression   ? GERD (gastroesophageal reflux disease)   ? High cholesterol   ? Hypertension   ? IBS (irritable  bowel syndrome)   ? Migraine   ? ? ?Past Surgical History:  ?Procedure Laterality Date  ? APPENDECTOMY    ? CESAREAN SECTION    ? CHOLECYSTECTOMY    ? COLONOSCOPY  2003  ? poor prep, grossly normal rectum, colon, and TI. Path not available.   ? ESOPHAGOGASTRODUODENOSCOPY  2003  ? Dr. Jena Gauss:  normal esophagus, stomach, duodenum s/p small bowel biopsy.   ? HERNIA REPAIR    ? TUBAL LIGATION    ? ? ?Social History: ? reports that she quit smoking about 2 years ago. Her smoking use included cigarettes. She has a 7.00 pack-year smoking history. She has never used smokeless tobacco. She reports that she does not drink alcohol and does not use drugs. ? ?Allergies  ?Allergen Reactions  ? Diclofenac Anaphylaxis  ? Neoma Laming Meat] Shortness Of Breath and Swelling  ?  Also Allergic to POPCORN: same reaction  ? Aspirin Other (See Comments)  ?  Nose bleeds  ? Bee Venom Swelling  ?  WASP/HORNET  ? Compazine [Prochlorperazine] Hives  ? Imitrex [Sumatriptan] Hives  ? Lamotrigine Other (See Comments)  ?  confusion  ? Nubain [Nalbuphine Hcl] Swelling and Other (See Comments)  ?  Eye swelling  ? Thorazine [Chlorpromazine] Hives  ? Topamax [Topiramate] Other (See Comments)  ?  Confusion and out of it  ? Voltaren [Diclofenac Sodium] Swelling  ? Zofran [Ondansetron Hcl] Nausea Only  ? ? ?Family History  ?Problem Relation Age of  Onset  ? Seizures Mother   ? Heart failure Mother   ? Heart failure Father   ? Colon cancer Neg Hx   ? Colon polyps Neg Hx   ? ? ? ?Prior to Admission medications   ?Medication Sig Start Date End Date Taking? Authorizing Provider  ?amLODipine (NORVASC) 10 MG tablet Take 1 tablet (10 mg total) by mouth daily. 02/23/22  Yes Erick Blinks, MD  ?Cholecalciferol (VITAMIN D3) 10 MCG (400 UNIT) CAPS Take 400 mg by mouth daily.   Yes [provider]  ?clonazePAM (KLONOPIN) 0.5 MG tablet Take 0.5 mg by mouth 2 (two) times daily as needed. 01/31/22  Yes [provider]  ?DULoxetine (CYMBALTA) 60 MG  capsule Take 60 mg by mouth daily. 01/25/22  Yes [provider]  ?EPINEPHrine 0.3 mg/0.3 mL IJ SOAJ injection Inject 0.3 mg into the muscle as directed. 05/11/21  Yes [provider]  ?famotidine (PEPCID) 20 MG tablet TAKE ONE TABLET BY MOUTH AFTER SUPPER. 03/14/21  Yes Oretha Milch, MD  ?FEROSUL 325 (65 Fe) MG tablet Take 325 mg by mouth 2 (two) times daily. 02/07/22  Yes [provider]  ?furosemide (LASIX) 40 MG tablet Take 40 mg by mouth daily.   Yes [provider]  ?gabapentin (NEURONTIN) 600 MG tablet Take 600 mg by mouth 3 (three) times daily. 01/24/22  Yes [provider]  ?hydrOXYzine (ATARAX/VISTARIL) 25 MG tablet Take 25 mg by mouth daily as needed for anxiety. 05/21/21  Yes [provider]  ?ipratropium-albuterol (DUONEB) 0.5-2.5 (3) MG/3ML SOLN Take 3 mLs by nebulization every 4 (four) hours as needed (shortness of breath or wheezing).   Yes [provider]  ?irbesartan (AVAPRO) 150 MG tablet Take 1 tablet (150 mg total) by mouth daily. 02/23/22  Yes Erick Blinks, MD  ?levocetirizine (XYZAL) 5 MG tablet Take 10 mg by mouth at bedtime.   Yes [provider]  ?meclizine (ANTIVERT) 25 MG tablet Take 25 mg by mouth 2 (two) times daily as needed for nausea. 01/20/22  Yes [provider]  ?metFORMIN (GLUCOPHAGE) 500 MG tablet Take 500-1,000 mg by mouth See admin instructions. Take 1000 mg by mouth in the morning and 500 mg in the evening   Yes [provider]  ?montelukast (SINGULAIR) 10 MG tablet Take 10 mg by mouth daily with supper.   Yes [provider]  ?naloxone (NARCAN) nasal spray 4 mg/0.1 mL 1 spray once. 08/31/21  Yes [provider]  ?oxyCODONE-acetaminophen (PERCOCET) 10-325 MG tablet Take 1 tablet by mouth 3 (three) times daily as needed for moderate pain.   Yes [provider]  ?OZEMPIC, 1 MG/DOSE, 4 MG/3ML SOPN Inject 1 mg into the skin every 7 (seven) days. 01/25/22  Yes [provider]  ?pantoprazole (PROTONIX) 40 MG tablet TAKE ONE TABLET BY MOUTH DAILY (MORNING) ?Patient taking differently: Take 40 mg by mouth daily. 10/13/21  Yes Nyoka Cowden, MD  ?pioglitazone (ACTOS) 45 MG tablet Take 45 mg by mouth daily.   Yes [provider]  ?pramipexole (MIRAPEX) 1 MG tablet Take 1 mg by mouth at bedtime.   Yes [provider]  ?promethazine (PHENERGAN) 25 MG tablet TAKE ONE TABLET BY MOUTH EVERY 8 HOURS AS NEEDED FOR NAUSEA AND VOMITING 02/08/22  Yes Gelene Mink, NP  ?propranolol (INDERAL) 10 MG tablet Take 10 mg by mouth at bedtime. 02/26/20  Yes [provider]  ?QUEtiapine (SEROQUEL) 50 MG tablet Take 100 mg by mouth at bedtime.  Yes [provider]  ?rosuvastatin (CRESTOR) 10 MG tablet Take 10 mg by mouth at bedtime.   Yes [provider]  ?SYMBICORT 80-4.5 MCG/ACT inhaler Inhale 2 puffs into the lungs daily. 01/31/22  Yes [provider]  ?colestipol (COLESTID) 1 g tablet TAKE TWO (2) TABLETS BY MOUTH TWICE DAILY ?Patient not taking: Reported on 02/17/2022 05/12/21   Gelene MinkBoone, Anna W, NP  ?JARDIANCE 10 MG TABS tablet Take 10 mg by mouth daily. ?Patient not taking: Reported on 02/17/2022 12/10/21   [provider]  ?potassium chloride (KLOR-CON M) 10 MEQ tablet Take 10 mEq by mouth daily. ?Patient not taking: Reported on 02/17/2022 01/31/22   [provider]  ?predniSONE (DELTASONE) 10 MG tablet Take 40mg  po daily for 2 days then 30mg  daily for 2 days then 20mg  daily for 2 days then 10mg  daily for 2 days then stop ?Patient not taking: Reported on 02/24/2022 02/23/22   Erick BlinksMemon, Randilyn Foisy, MD  ? ? ?Physical Exam: ?Vitals:  ? 02/24/22 1441 02/24/22 1445 02/24/22 1500 02/24/22 1622  ?BP:   (!) 119/100   ?Pulse:   (!) 101 98  ?Resp:   (!) 22 (!) 26  ?Temp:  (!) 101.3 ?F (38.5 ?C)  (!) 100.8 ?F (38.2 ?C)  ?TempSrc:  Axillary  Axillary  ?SpO2: (!) 86%  91% 92%  ?Weight:  99.3 kg    ?Height:  5\' 5"  (1.651 m)    ? ? ?Constitutional:  Currently is somnolent, does wake up to voice, BiPAP in place ?Eyes: PERRL, lids and conjunctivae normal ?ENMT: Unable to assess due to BiPAP mask ?Neck: normal, supple, no masses, no thyromegaly ?Respirato

## 2022-02-25 ENCOUNTER — Inpatient Hospital Stay (HOSPITAL_COMMUNITY): Payer: 59

## 2022-02-25 DIAGNOSIS — J9621 Acute and chronic respiratory failure with hypoxia: Secondary | ICD-10-CM | POA: Diagnosis not present

## 2022-02-25 DIAGNOSIS — J9622 Acute and chronic respiratory failure with hypercapnia: Secondary | ICD-10-CM | POA: Diagnosis not present

## 2022-02-25 DIAGNOSIS — J869 Pyothorax without fistula: Secondary | ICD-10-CM

## 2022-02-25 DIAGNOSIS — G9341 Metabolic encephalopathy: Secondary | ICD-10-CM | POA: Diagnosis not present

## 2022-02-25 DIAGNOSIS — J189 Pneumonia, unspecified organism: Secondary | ICD-10-CM | POA: Diagnosis not present

## 2022-02-25 DIAGNOSIS — J449 Chronic obstructive pulmonary disease, unspecified: Secondary | ICD-10-CM | POA: Diagnosis not present

## 2022-02-25 LAB — CBC
HCT: 40.3 % (ref 36.0–46.0)
Hemoglobin: 12.6 g/dL (ref 12.0–15.0)
MCH: 30.7 pg (ref 26.0–34.0)
MCHC: 31.3 g/dL (ref 30.0–36.0)
MCV: 98.3 fL (ref 80.0–100.0)
Platelets: 249 10*3/uL (ref 150–400)
RBC: 4.1 MIL/uL (ref 3.87–5.11)
RDW: 17.1 % — ABNORMAL HIGH (ref 11.5–15.5)
WBC: 21.3 10*3/uL — ABNORMAL HIGH (ref 4.0–10.5)
nRBC: 0.2 % (ref 0.0–0.2)

## 2022-02-25 LAB — STREP PNEUMONIAE URINARY ANTIGEN: Strep Pneumo Urinary Antigen: NEGATIVE

## 2022-02-25 LAB — COMPREHENSIVE METABOLIC PANEL
ALT: 22 U/L (ref 0–44)
AST: 15 U/L (ref 15–41)
Albumin: 2.7 g/dL — ABNORMAL LOW (ref 3.5–5.0)
Alkaline Phosphatase: 50 U/L (ref 38–126)
Anion gap: 8 (ref 5–15)
BUN: 9 mg/dL (ref 6–20)
CO2: 33 mmol/L — ABNORMAL HIGH (ref 22–32)
Calcium: 8.6 mg/dL — ABNORMAL LOW (ref 8.9–10.3)
Chloride: 96 mmol/L — ABNORMAL LOW (ref 98–111)
Creatinine, Ser: 0.38 mg/dL — ABNORMAL LOW (ref 0.44–1.00)
GFR, Estimated: >60 mL/min (ref >60)
Glucose, Bld: 113 mg/dL — ABNORMAL HIGH (ref 70–99)
Potassium: 3.6 mmol/L (ref 3.5–5.1)
Sodium: 137 mmol/L (ref 135–145)
Total Bilirubin: 0.6 mg/dL (ref 0.3–1.2)
Total Protein: 6.6 g/dL (ref 6.5–8.1)

## 2022-02-25 LAB — GLUCOSE, CAPILLARY
Glucose-Capillary: 101 mg/dL — ABNORMAL HIGH (ref 70–99)
Glucose-Capillary: 102 mg/dL — ABNORMAL HIGH (ref 70–99)
Glucose-Capillary: 112 mg/dL — ABNORMAL HIGH (ref 70–99)
Glucose-Capillary: 123 mg/dL — ABNORMAL HIGH (ref 70–99)
Glucose-Capillary: 131 mg/dL — ABNORMAL HIGH (ref 70–99)
Glucose-Capillary: 183 mg/dL — ABNORMAL HIGH (ref 70–99)

## 2022-02-25 LAB — PROCALCITONIN: Procalcitonin: <0.1 ng/mL

## 2022-02-25 LAB — TSH: TSH: 1.057 u[IU]/mL (ref 0.350–4.500)

## 2022-02-25 IMAGING — CT CT ANGIO CHEST
2 of 8 series · 17 of 46 positions shown · IV contrast (Omnipaque or Isovue)
Comparison: CT chest angio dated [DATE]

CLINICAL DATA: Shortness of breath

EXAM:
CT ANGIOGRAPHY CHEST WITH CONTRAST
TECHNIQUE: Multidetector CT imaging of the chest was performed using the
standard protocol during bolus administration of intravenous
contrast. Multiplanar CT image reconstructions and MIPs were
obtained to evaluate the vascular anatomy.

[Series 5: pe axial thins · axial · 0.76mm/px · z∈[-318,-59]mm · 14 of 365 slices shown]
[im 21/365  lung]
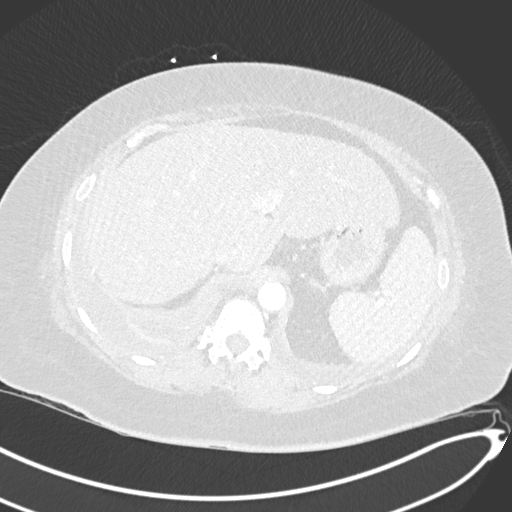
[im 41/365  soft-tissue]
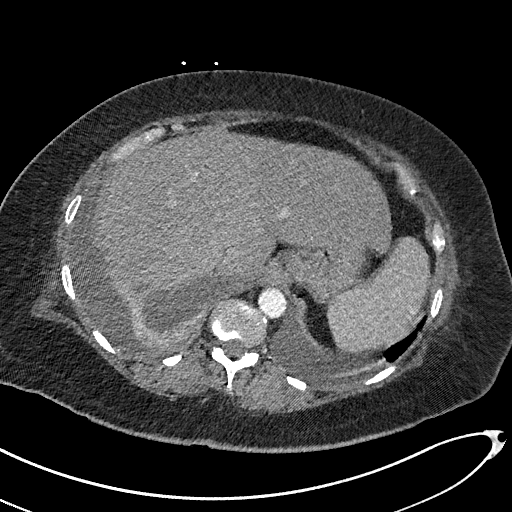
[im 81/365  lung]
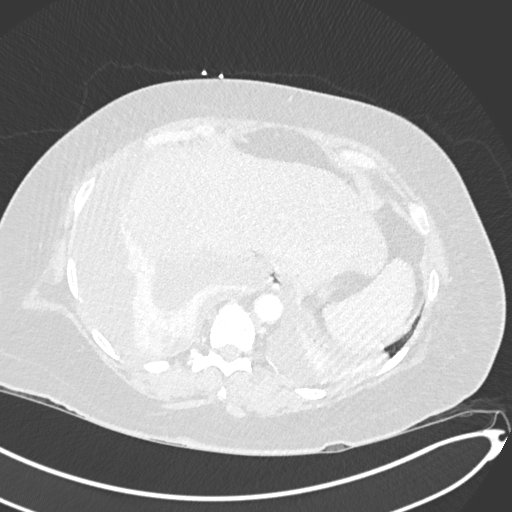
[im 102/365  soft-tissue]
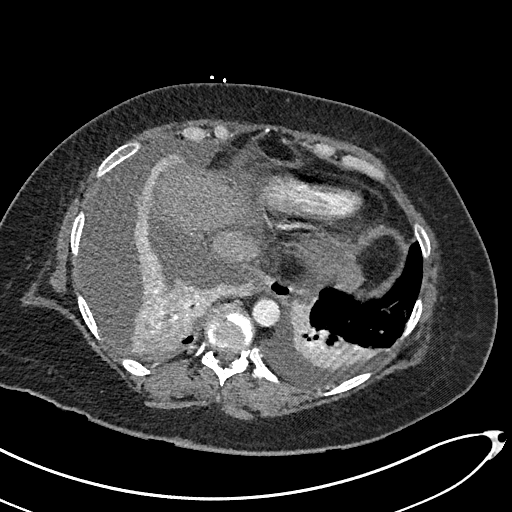
[im 122/365  lung]
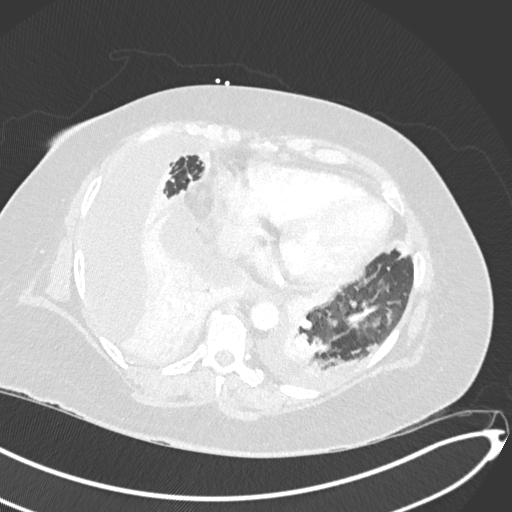
[im 142/365  soft-tissue]
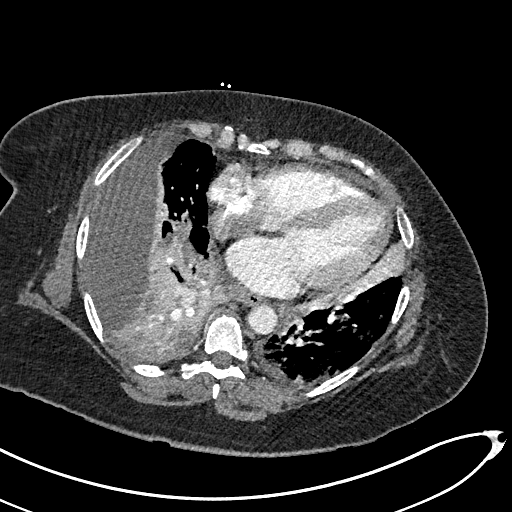
[im 162/365  lung]
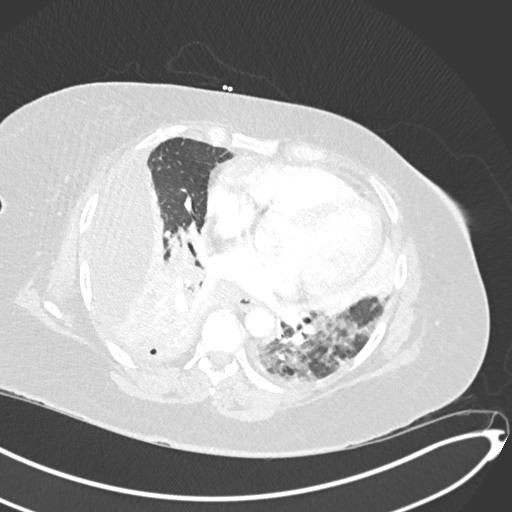
[im 203/365  soft-tissue]
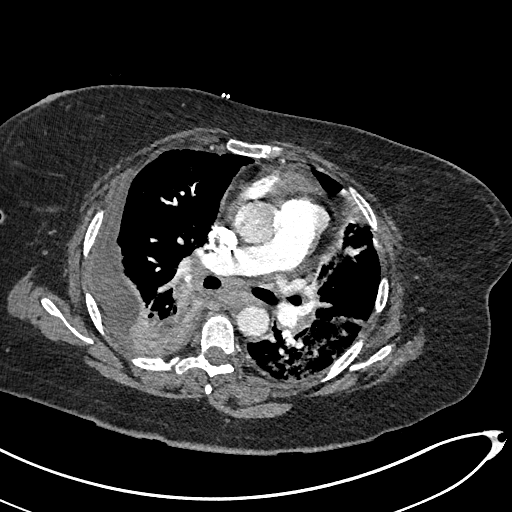
[im 223/365  lung]
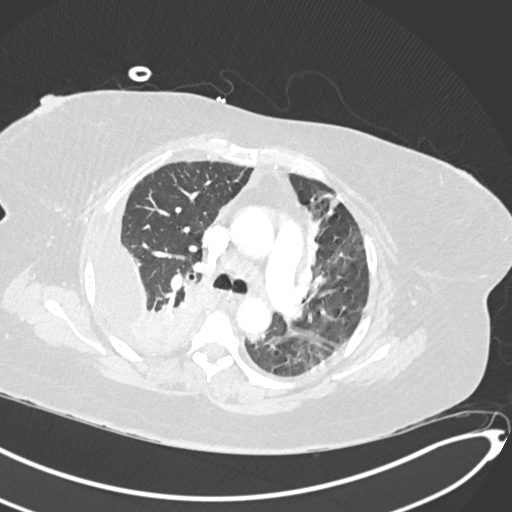
[im 243/365  soft-tissue]
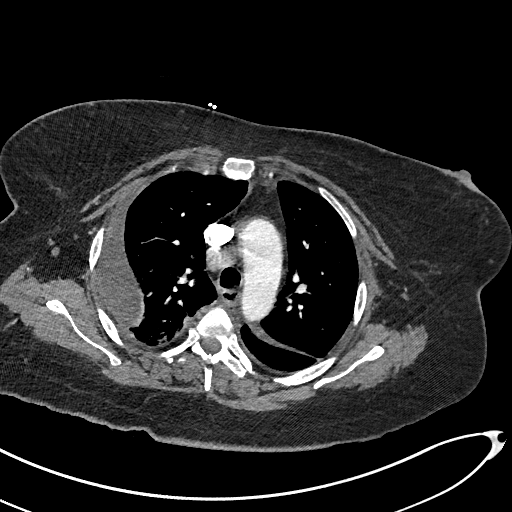
[im 263/365  lung]
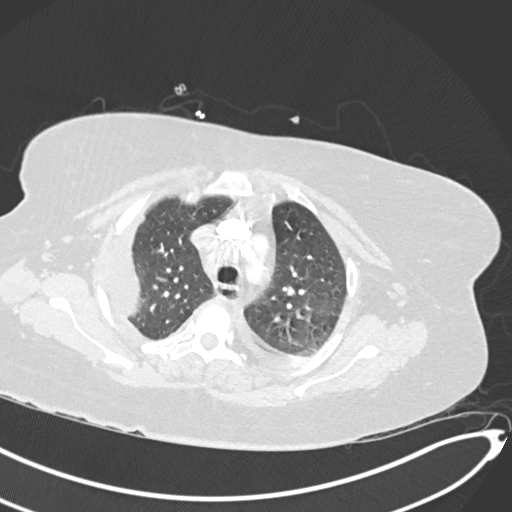
[im 284/365  soft-tissue]
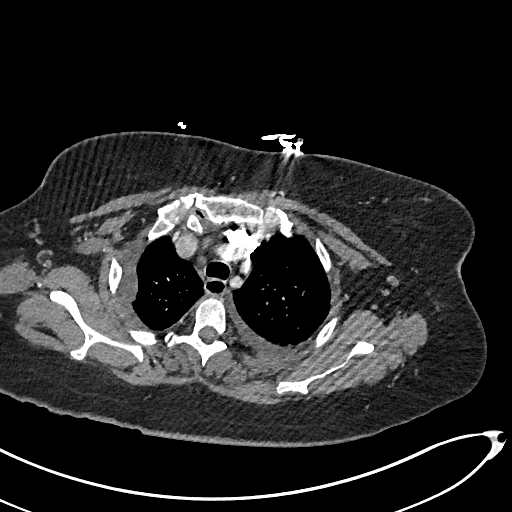
[im 324/365  lung]
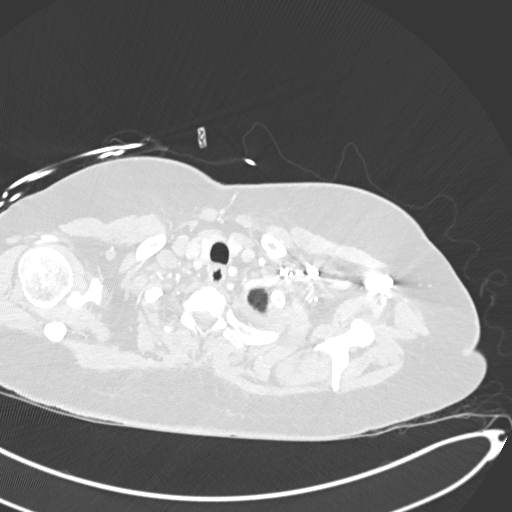
[im 344/365  soft-tissue]
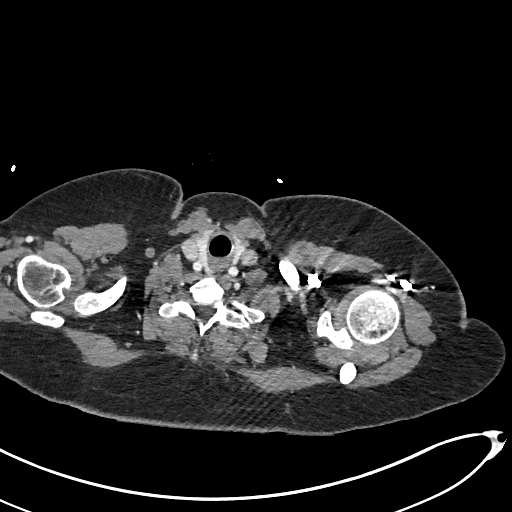

[Series 7: cor soft · coronal · 0.58mm/px · 3 of 153 slices shown]
[im 39/153  soft-tissue]
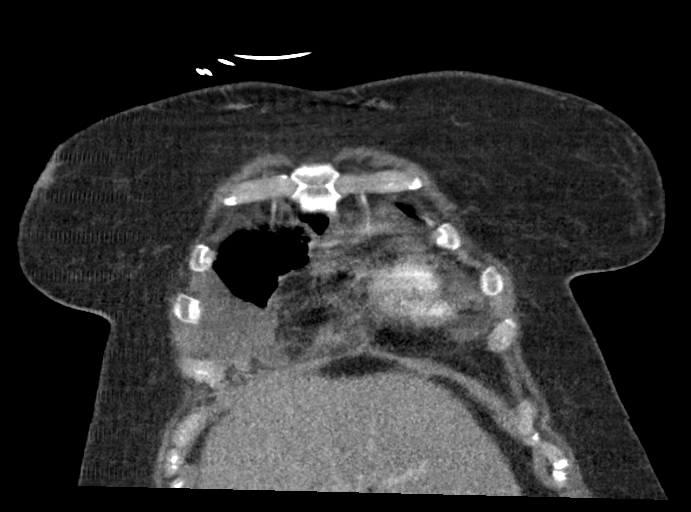
[im 77/153  soft-tissue]
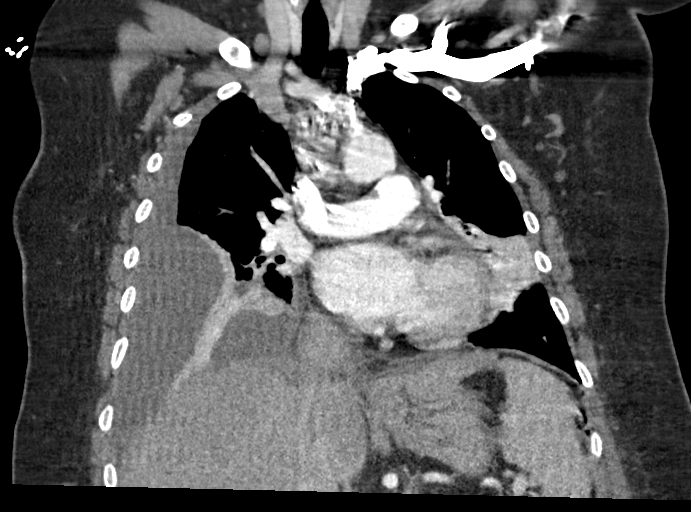
[im 115/153  soft-tissue]
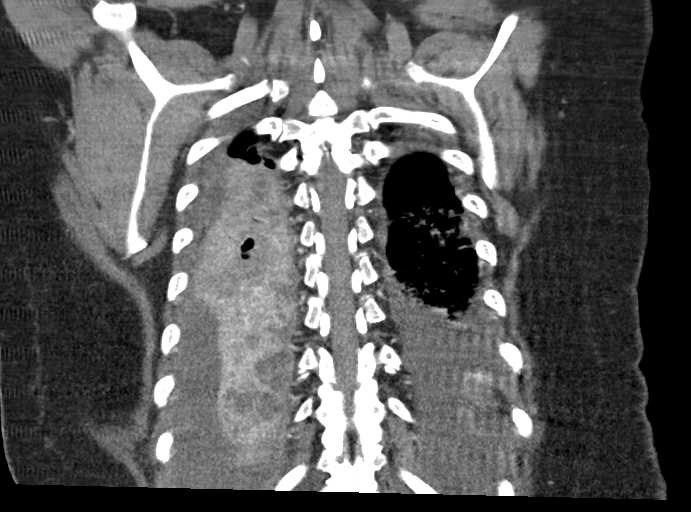

[17 of 46 positions shown; findings below may reference images not displayed]

RADIATION DOSE REDUCTION: This exam was performed according to the
departmental dose-optimization program which includes automated
exposure control, adjustment of the mA and/or kV according to
patient size and/or use of iterative reconstruction technique.

CONTRAST:  75mL OMNIPAQUE IOHEXOL 350 MG/ML SOLN
FINDINGS: Cardiovascular: Adequate contrast opacification of the pulmonary
arteries. No evidence of pulmonary embolus. Normal heart size. Trace
pericardial fluid. Normal caliber thoracic aorta with mild
atherosclerotic disease.

Mediastinum/Nodes: Patulous esophagus. Thyroid is unremarkable.
Enlarged mediastinal and bilateral hilar lymph nodes. Reference
right lower paratracheal lymph node measuring 1.5 cm in short axis,
unchanged when compared to the prior exam. Prominent right
supraclavicular lymph node measuring 8 mm on series 4, image 12,
stable.

Lungs/Pleura: Central airways are patent. Moderate loculated right
pleural effusion and small left pleural effusion. Right basilar
consolidation with scattered areas of low attenuation. New
ground-glass opacity of the left lower lobe. Similar left basilar
and lingular atelectasis.

Upper Abdomen: No acute abnormality.

Musculoskeletal: No chest wall abnormality. No acute or significant
osseous findings.

Review of the MIP images confirms the above findings.
IMPRESSION: 1. No evidence of pulmonary embolus.
2. Worsening multifocal pneumonia with new ground-glass opacities of
the left lower lobe and new areas of low attenuation seen in the
right basilar consolidation which are concerning for necrotic
pneumonia.
3. New moderate loculated right pleural effusion, concerning for
empyema.
4. New small left pleural effusion.

## 2022-02-25 MED ORDER — IOHEXOL 350 MG/ML SOLN
75.0000 mL | Freq: Once | INTRAVENOUS | Status: AC | PRN
  Administered 2022-02-25: 75 mL via INTRAVENOUS

## 2022-02-25 NOTE — Progress Notes (Signed)
? ?NAME:  Beverly Tran, MRN:  [id number], DOB:  [birth year], LOS: 1 ?ADMISSION DATE:  02/24/2022, CONSULTATION DATE:  02/21/22 ?REFERRING MD:  Emokpae/Triad, CHIEF COMPLAINT:  resp distress   ? ?History of Present Illness:  ?51  yowf quit smoking 01/2020 last seen in pulmonary clinic 03/18/21 with dx cough variant asthma vs VCD flared on ACEi and better off acei and on max gerd rx s hypercarbia but using 02 prn and with rec for pfts which were  never done >>>   admitted on 02/16/2022 ? on ACEi again with dx sepsis secondary to multifocal pneumonia resulting in acute on chronic hypoxic respiratory failure requiring prolonged BiPAP use so PCCM consulted 5/8 rec wean bipap/ 02/ rx empirically for CAP but pt left ama once awake enough to refuse bipap ? ?5/11 returned in am to ER with increase sob at rest and not saturating  above 80% on her home flow reported to be 6lpm (not verified, nor clear what meds she's really taking) - c/o midline mid back pain s pleuritic component, some gen ha but no N or V or Chills or abd pain or dysuria.  W/u showed new mod large R effusion so rec to tap in ER ASAP > c/w empyema confirmed 5/11 so rec trx to [facility] for IR drain and likely will need lytics  ? ? ? ?Pertinent  Medical History  ?DM type 2 with diabetic neuropathy ?HBP ?GERD ?RLS  ? ? ?Significant Hospital Events: ?Including procedures, antibiotic start and stop dates in addition to other pertinent events   ?CTa  5/3 Severe bilateral multifocal pneumonia, most confluent in the right lower lobe.  ?Urine strep/ legionella 5/3 neg  ?MRSA PCR  5/4  POS ?Echo 5/4 ok x for RA est pressure 15  with Nl LA size but RA not well viz ?R Tcentesis 5/11 >>> too organized to drain ?Maxepime/Vanc  5/11 >>> ?CTa 5/12 c/w empyema on R > IR ([facility]) rec CT directed drain/likely to need lytics ? ? ? ? ?Scheduled Meds: ? arformoterol  15 mcg Nebulization BID  ? budesonide (PULMICORT) nebulizer solution  0.25 mg Nebulization BID  ? Chlorhexidine Gluconate  Cloth  6 each Topical Q0600  ? heparin  5,000 Units Subcutaneous Q8H  ? insulin aspart  0-15 Units Subcutaneous Q4H  ? ipratropium-albuterol  3 mL Nebulization Q6H  ? mouth rinse  15 mL Mouth Rinse q12n4p  ? mupirocin ointment   Nasal BID  ? pantoprazole (PROTONIX) IV  40 mg Intravenous Q24H  ? ?Continuous Infusions: ? ceFEPime (MAXIPIME) IV 2 g (02/25/22 0554)  ? vancomycin    ? ?PRN Meds:.acetaminophen, acetaminophen, ondansetron **OR** ondansetron (ZOFRAN) IV  ? ? ?Interim History / Subjective:  ?Better this am, sitting up in bed/ rattling cough  ? ?Objective   ?Blood pressure (!) 171/75, pulse (!) 109, temperature (!) 101.5 ?F (38.6 ?C), temperature source Oral, resp. rate (!) 32, height 5\' 5"  (1.651 m), weight 99.3 kg, SpO2 91 %. ?   ?FiO2 (%):  [55 %-80 %] 55 %  ? ?Intake/Output Summary (Last 24 hours) at 02/25/2022 1250 ?Last data filed at 02/25/2022 1000 ?Gross per 24 hour  ?Intake 100 ml  ?Output 2031 ml  ?Net -1931 ml  ? ? ?Filed Weights  ? 02/24/22 1146 02/24/22 1445  ?Weight: 103.1 kg 99.3 kg  ? ? ?Examination: ?Tmax:  101.5 ?General appearance:    more comfortable today sitting on side of bed on high flow nasal 02   ?At Rest 02  sats  91% on 5lpm HFNC  ?No jvd ?Oropharynx clear,  mucosa nl ?Neck supple ?Lungs with a few scattered exp > insp rhonchi bilaterally/ decreased  BS R > L base  ?RRR no s3 or or sign murmur ?Abd obese with poor insp  excursion  ?Extr warm with no edema or clubbing noted ?Neuro  Sensorium intact, knows my name, [facility], mothers day in 2 days ,  no apparent motor deficits  ? ? ?Assessment & Plan:  ? ?1) Acute  hypoxemic and hypercarbic resp failure in setting of multifocal pna/effusions/ MO ?- HC03  on 10/16/21 = 31 at [place] [facility] ?>>> rec maxepime/Vanc for now and use bipap  while  in bed adjust tidal vol to about 500 and keep sats 85-90 % but no higher when in bed/ otherwise just adjust HFNC to sats around same levels, no higher than 90% ideally / up in chair as much as possible  ?>>> TSH  level to be complete 5/12 >>> ? ?2) Underlying UACS/ pseudowheeze and intol to ACEi labeled previously as asthma ?>>> rx gerd/ pfts as outpt with f/l loop may be helpful vs refer to ENT in [place] if proves refractory off acei and on GERD rx  ? ?3) New R effusion prob empyema  by CTa chest 5/12 ?Discussed options with IR([facility])/ pt > agreed needs CT drainage and likely lytics so rec transfer to [facility] and order CT guided drain which can be done per IR anytime this weekend with f/u by PCCM team to start lytics if needed  ?  ?4)  Variable mentation related to hypercabia/ improved  ?>>> see vent goals for bipap while in bed/ minimize sedating meds  ? ?5) Protein calorie malnutrition with Alb 2.8 on admit ? ? ?Best Practice (right click and "Reselect all SmartList Selections" daily)  ?  ? ?Labs   ?CBC: ?Recent Labs  ?Lab 02/20/22 ?1607 02/22/22 ?0430 02/23/22 ?0352 02/24/22 ?1218 02/25/22 ?0640  ?WBC 11.4* 13.7* 15.1* 19.2* 21.3*  ?NEUTROABS  --   --   --  15.7*  --   ?HGB 12.9 12.9 12.8 12.7 12.6  ?HCT 39.3 40.7 39.3 39.7 40.3  ?MCV 96.6 96.2 96.6 96.6 98.3  ?PLT 160 240 265 277 249  ? ? ?Basic Metabolic Panel: ?Recent Labs  ?Lab 02/20/22 ?3710 02/22/22 ?0430 02/24/22 ?1218 02/25/22 ?0640  ?NA 141 139 135 137  ?K 3.6 3.7 3.7 3.6  ?CL 102 97* 91* 96*  ?CO2 31 33* 35* 33*  ?GLUCOSE 230* 198* 144* 113*  ?BUN 19 18 11 9   ?CREATININE 0.40* 0.46 0.40* 0.38*  ?CALCIUM 9.5 9.0 8.4* 8.6*  ? ?GFR: ?Estimated Creatinine Clearance: 98.1 mL/min (A) (by C-G formula based on SCr of 0.38 mg/dL (L)). ?Recent Labs  ?Lab 02/22/22 ?0430 02/23/22 ?0352 02/24/22 ?1218 02/25/22 ?0640  ?PROCALCITON  --   --  <0.10 <0.10  ?WBC 13.7* 15.1* 19.2* 21.3*  ? ? ?Liver Function Tests: ?Recent Labs  ?Lab 02/24/22 ?1218 02/25/22 ?0640  ?AST 17 15  ?ALT 26 22  ?ALKPHOS 48 50  ?BILITOT 0.7 0.6  ?PROT 6.4* 6.6  ?ALBUMIN 2.8* 2.7*  ? ?No results for input(s): LIPASE, AMYLASE in the last 168 hours. ?No results for input(s): AMMONIA in the last 168 hours. ? ?ABG ?    ?Component Value Date/Time  ? PHART 7.54 (H) 02/24/2022 1207  ? PCO2ART 52 (H) 02/24/2022 1207  ? PO2ART 64 (L) 02/24/2022 1207  ? HCO3 44.6 (H) 02/24/2022 1207  ? TCO2 22 08/18/2010  2152  ? O2SAT 91.6 02/24/2022 1207  ?  ? ?Coagulation Profile: ?No results for input(s): INR, PROTIME in the last 168 hours. ? ? ?Cardiac Enzymes: ?No results for input(s): CKTOTAL, CKMB, CKMBINDEX, TROPONINI in the last 168 hours. ? ?HbA1C: ?Hgb A1c MFr Bld  ?Date/Time Value Ref Range Status  ?02/16/2022 05:49 PM 7.0 (H) 4.8 - 5.6 % Final  ?  Comment:  ?  (NOTE) ?Pre diabetes:          5.7%-6.4% ? ?Diabetes:              >6.4% ? ?Glycemic control for   <7.0% ?adults with diabetes ?  ? ? ?CBG: ?Recent Labs  ?Lab 02/24/22 ?2021 02/24/22 ?2334 02/25/22 ?0354 02/25/22 ?4580 02/25/22 ?1107  ?GLUCAP 142* 113* 131* 102* 101*  ? ?Sandrea Hughs, MD ?Pulmonary and Critical Care Medicine ?[facility] ?Cell [phone]  ? ?After 7:00 pm call Elink  [phone]  ?

## 2022-02-25 NOTE — Progress Notes (Signed)
**Note De-Identified  Obfuscation** Patient removed from BIPAP and placed on 4L HFNC SAT 90%; tolerating well at this time. RRT to continue to monitor. ?

## 2022-02-25 NOTE — Progress Notes (Signed)
?PROGRESS NOTE ? ? ? ?Beverly Tran  ZOX:[id number] DOB: 1971/03/17 DOA: 02/24/2022 ?PCP: [facility]  ? ? ?Brief Narrative:  ?51 year old female with a history of COPD, chronic respiratory failure on 5 L of oxygen, admitted to the hospital with acute on chronic respiratory failure.  Noted to have right-sided pleural effusion/empyema.  On IV antibiotics.  Pulmonology following.  Plans are to transfer to [facility] for CT-guided drain placement and lytics. ? ? ?Assessment & Plan: ?  ?Principal Problem: ?  Acute on chronic respiratory failure (HCC) ?Active Problems: ?  Essential hypertension ?  Gastroesophageal reflux disease without esophagitis ?  Sepsis (HCC) ?  Hyperglycemia due to diabetes mellitus (HCC) ?  Mixed hyperlipidemia ?  Restless leg syndrome ?  Diabetic neuropathy (HCC) ?  COPD (chronic obstructive pulmonary disease) (HCC) ?  Acute metabolic encephalopathy ?  HCAP (healthcare-associated pneumonia) ?  Anxiety ?  Empyema of right pleural space (HCC) ? ? ?Acute on chronic respiratory failure ?-Chronically on 5 L of oxygen at home ?-Secondary to HCAP/empyema ?-Was initially on BiPAP on admission, has since been transitioned to nasal cannula at 5 L ?-Oxygen saturation goal should be between 85 to 90% ? ?HCAP/right empyema ?-Currently on vancomycin/cefepime ?-Pulmonology following ?-Will need CT-guided drain placement by IR in order to administer thrombolytics ?-Pulmonology following ? ?Sepsis ?-Continues to have fevers ?-Continue antibiotics ?-Blood pressure currently stable ?-She is tachycardic, related to fevers ?-Follow-up cultures ? ?COPD ?-No wheezing at this time ?-Continue bronchodilators ? ?Diabetes ?-Chronically on pioglitazone, Jardiance, metformin and Ozempic ?-Currently on sliding scale insulin ? ?Acute metabolic encephalopathy ?-Related to hypoxia/fever/sepsis ?-Overall mental status has improved back to baseline since admission ? ?Anxiety ?-Chronically on  Klonopin ?-We will consider benzodiazepines as needed ?-Would avoid sedative medications in light of her respiratory status ? ?Hyperlipidemia ?-Resume statin ? ?Hypertension ? ?GERD ?-Continue PPI ? ? ?DVT prophylaxis: heparin injection 5,000 Units Start: 02/24/22 2200 ? ?Code Status: full code ?Family Communication: discussed with patient ?Disposition Plan: Status is: Inpatient ?Remains inpatient appropriate because: transfer to [facility] for management of empyema ? ? ? ? ?Consultants:  ?Pulmonology ? ?Procedures:  ? ? ?Antimicrobials:  ?Vancomycin 5/11> ?Cefepime 5/11>  ? ? ?Subjective: ?Sitting up in chair.  Sats noted to be in the 80s on 5 L.  Does not appear to be in distress.  She is coughing.  Has persistent fevers ? ?Objective: ?Vitals:  ? 02/25/22 1123 02/25/22 1200 02/25/22 1300 02/25/22 1418  ?BP:  (!) 171/75 (!) 145/73   ?Pulse:  (!) 109 (!) 114   ?Resp:  (!) 32 (!) 31   ?Temp: (!) 101.5 ?F (38.6 ?C)     ?TempSrc: Oral     ?SpO2:  91% (!) 86% (!) 86%  ?Weight:      ?Height:      ? ? ?Intake/Output Summary (Last 24 hours) at 02/25/2022 1437 ?Last data filed at 02/25/2022 1000 ?Gross per 24 hour  ?Intake 100 ml  ?Output 2031 ml  ?Net -1931 ml  ? ?Filed Weights  ? 02/24/22 1146 02/24/22 1445  ?Weight: 103.1 kg 99.3 kg  ? ? ?Examination: ? ?General exam: Appears calm and comfortable  ?Respiratory system: Diminished breath sounds at right base.  Mild increased respiratory effort. ?Cardiovascular system: S1 & S2 heard, RRR. No JVD, murmurs, rubs, gallops or clicks.  1+ pedal edema. ?Gastrointestinal system: Abdomen is nondistended, soft and nontender. No organomegaly or masses felt. Normal bowel sounds heard. ?Central nervous system: Alert and oriented. No focal  neurological deficits. ?Extremities: Symmetric 5 x 5 power. ?Skin: No rashes, lesions or ulcers ?Psychiatry: Judgement and insight appear normal. Mood & affect appropriate.  ? ? ? ?Data Reviewed: I have personally reviewed following labs and imaging  studies ? ?CBC: ?Recent Labs  ?Lab 02/20/22 ?[id number] 02/22/22 ?0430 02/23/22 ?0352 02/24/22 ?1218 02/25/22 ?0640  ?WBC 11.4* 13.7* 15.1* 19.2* 21.3*  ?NEUTROABS  --   --   --  15.7*  --   ?HGB 12.9 12.9 12.8 12.7 12.6  ?HCT 39.3 40.7 39.3 39.7 40.3  ?MCV 96.6 96.2 96.6 96.6 98.3  ?PLT 160 240 265 277 249  ? ?Basic Metabolic Panel: ?Recent Labs  ?Lab 02/20/22 ?[id number] 02/22/22 ?0430 02/24/22 ?1218 02/25/22 ?0640  ?NA 141 139 135 137  ?K 3.6 3.7 3.7 3.6  ?CL 102 97* 91* 96*  ?CO2 31 33* 35* 33*  ?GLUCOSE 230* 198* 144* 113*  ?BUN 19 18 11 9   ?CREATININE 0.40* 0.46 0.40* 0.38*  ?CALCIUM 9.5 9.0 8.4* 8.6*  ? ?GFR: ?Estimated Creatinine Clearance: 98.1 mL/min (A) (by C-G formula based on SCr of 0.38 mg/dL (L)). ?Liver Function Tests: ?Recent Labs  ?Lab 02/24/22 ?1218 02/25/22 ?0640  ?AST 17 15  ?ALT 26 22  ?ALKPHOS 48 50  ?BILITOT 0.7 0.6  ?PROT 6.4* 6.6  ?ALBUMIN 2.8* 2.7*  ? ?No results for input(s): LIPASE, AMYLASE in the last 168 hours. ?No results for input(s): AMMONIA in the last 168 hours. ?Coagulation Profile: ?No results for input(s): INR, PROTIME in the last 168 hours. ?Cardiac Enzymes: ?No results for input(s): CKTOTAL, CKMB, CKMBINDEX, TROPONINI in the last 168 hours. ?BNP (last 3 results) ?No results for input(s): PROBNP in the last 8760 hours. ?HbA1C: ?No results for input(s): HGBA1C in the last 72 hours. ?CBG: ?Recent Labs  ?Lab 02/24/22 ?2021 02/24/22 ?2334 02/25/22 ?0354 02/25/22 ?[id number] 02/25/22 ?1107  ?GLUCAP 142* 113* 131* 102* 101*  ? ?Lipid Profile: ?No results for input(s): CHOL, HDL, LDLCALC, TRIG, CHOLHDL, LDLDIRECT in the last 72 hours. ?Thyroid Function Tests: ?No results for input(s): TSH, T4TOTAL, FREET4, T3FREE, THYROIDAB in the last 72 hours. ?Anemia Panel: ?No results for input(s): VITAMINB12, FOLATE, FERRITIN, TIBC, IRON, RETICCTPCT in the last 72 hours. ?Sepsis Labs: ?Recent Labs  ?Lab 02/24/22 ?1218 02/25/22 ?0640  ?PROCALCITON <0.10 <0.10  ? ? ?Recent Results (from the past 240 hour(s))   ?Blood Culture (routine x 2)     Status: None  ? Collection Time: 02/16/22  6:02 PM  ? Specimen: Left Antecubital; Blood  ?Result Value Ref Range Status  ? Specimen Description LEFT ANTECUBITAL  Final  ? Special Requests   Final  ?  BOTTLES DRAWN AEROBIC AND ANAEROBIC Blood Culture adequate volume  ? Culture   Final  ?  NO GROWTH 5 DAYS ?Performed at [facility], [address] [place], [place] [address] ?  ? Report Status 02/21/2022 FINAL  Final  ?Blood Culture (routine x 2)     Status: None  ? Collection Time: 02/16/22  6:03 PM  ? Specimen: BLOOD LEFT FOREARM  ?Result Value Ref Range Status  ? Specimen Description BLOOD LEFT FOREARM  Final  ? Special Requests   Final  ?  BOTTLES DRAWN AEROBIC AND ANAEROBIC Blood Culture results may not be optimal due to an inadequate volume of blood received in culture bottles  ? Culture   Final  ?  NO GROWTH 5 DAYS ?Performed at [facility], [address] [place], [place] [address] ?  ? Report Status 02/21/2022 FINAL  Final  ?  Urine Culture     Status: Abnormal  ? Collection Time: 02/16/22  6:56 PM  ? Specimen: In/Out Cath Urine  ?Result Value Ref Range Status  ? Specimen Description   Final  ?  IN/OUT CATH URINE ?Performed at [facility], [address], [place], [place] [address] ?  ? Special Requests   Final  ?  NONE ?Performed at [facility], [address], [place], [place] [address] ?  ? Culture MULTIPLE SPECIES PRESENT, SUGGEST RECOLLECTION (A)  Final  ? Report Status 02/18/2022 FINAL  Final  ?Resp Panel by RT-PCR (Flu A&B, Covid) Nasopharyngeal Swab     Status: None  ? Collection Time: 02/16/22  9:00 PM  ? Specimen: Nasopharyngeal Swab; Nasopharyngeal(NP) swabs in vial transport medium  ?Result Value Ref Range Status  ? SARS Coronavirus 2 by RT PCR NEGATIVE NEGATIVE Final  ?  Comment: (NOTE) ?SARS-CoV-2 target nucleic acids are NOT DETECTED. ? ?The SARS-CoV-2 RNA is generally detectable in upper respiratory ?specimens during the acute phase of infection. The  lowest ?concentration of SARS-CoV-2 viral copies this assay can detect is ?138 copies/mL. A negative result does not preclude SARS-Cov-2 ?infection and should not be used as the sole basis for treatment or ?other patient management dec

## 2022-02-25 NOTE — Progress Notes (Signed)
Pt mask changed from medium to small due to increase leak greater than 80% decreased fio2 to 55% due spo2 91% spo2 range between 85-90% ?

## 2022-02-25 NOTE — TOC Progression Note (Addendum)
Transition of Care (TOC) - Progression Note  ? ? ?Patient Details  ?Name: Beverly Tran ?MRN: [id number] ?Date of Birth: Mar 24, 1971 ? ?Transition of Care (TOC) CM/SW Contact  ?Boneta Lucks, RN ?Phone Number: ?02/25/2022, 10:02 AM ? ?Clinical Narrative:   Patient was critical on 5/10 not assessed on 10L needing multiple days. Patient left AMA had home oxygen going up to 6L. Returns to ED in 12 hours, admitted. MD talking with [facility] for transfer. Depending on CT results. TOC to follow.  ? ?  ?Barriers to Discharge: Continued Medical Work up ? ?Expected Discharge Plan and Services ?  ?  ? Transfer to [facility] ?

## 2022-02-26 ENCOUNTER — Inpatient Hospital Stay (HOSPITAL_COMMUNITY): Payer: 59

## 2022-02-26 DIAGNOSIS — G9341 Metabolic encephalopathy: Secondary | ICD-10-CM | POA: Diagnosis not present

## 2022-02-26 DIAGNOSIS — J90 Pleural effusion, not elsewhere classified: Secondary | ICD-10-CM | POA: Diagnosis not present

## 2022-02-26 DIAGNOSIS — J9621 Acute and chronic respiratory failure with hypoxia: Secondary | ICD-10-CM | POA: Diagnosis not present

## 2022-02-26 DIAGNOSIS — J9622 Acute and chronic respiratory failure with hypercapnia: Secondary | ICD-10-CM | POA: Diagnosis not present

## 2022-02-26 LAB — COMPREHENSIVE METABOLIC PANEL
ALT: 21 U/L (ref 0–44)
AST: 16 U/L (ref 15–41)
Albumin: 2.5 g/dL — ABNORMAL LOW (ref 3.5–5.0)
Alkaline Phosphatase: 54 U/L (ref 38–126)
Anion gap: 11 (ref 5–15)
BUN: 8 mg/dL (ref 6–20)
CO2: 30 mmol/L (ref 22–32)
Calcium: 9 mg/dL (ref 8.9–10.3)
Chloride: 95 mmol/L — ABNORMAL LOW (ref 98–111)
Creatinine, Ser: 0.53 mg/dL (ref 0.44–1.00)
GFR, Estimated: >60 mL/min (ref >60)
Glucose, Bld: 101 mg/dL — ABNORMAL HIGH (ref 70–99)
Potassium: 3.4 mmol/L — ABNORMAL LOW (ref 3.5–5.1)
Sodium: 136 mmol/L (ref 135–145)
Total Bilirubin: 0.8 mg/dL (ref 0.3–1.2)
Total Protein: 7.1 g/dL (ref 6.5–8.1)

## 2022-02-26 LAB — BODY FLUID CELL COUNT WITH DIFFERENTIAL
Eos, Fluid: 0 %
Lymphs, Fluid: 41 %
Monocyte-Macrophage-Serous Fluid: 3 % — ABNORMAL LOW (ref 50–90)
Neutrophil Count, Fluid: 56 % — ABNORMAL HIGH (ref 0–25)
Total Nucleated Cell Count, Fluid: 486 cu mm (ref 0–1000)

## 2022-02-26 LAB — GLUCOSE, PLEURAL OR PERITONEAL FLUID: Glucose, Fluid: 87 mg/dL

## 2022-02-26 LAB — PROTEIN, PLEURAL OR PERITONEAL FLUID: Total protein, fluid: 3.9 g/dL

## 2022-02-26 LAB — GLUCOSE, CAPILLARY
Glucose-Capillary: 108 mg/dL — ABNORMAL HIGH (ref 70–99)
Glucose-Capillary: 130 mg/dL — ABNORMAL HIGH (ref 70–99)
Glucose-Capillary: 93 mg/dL (ref 70–99)
Glucose-Capillary: 183 mg/dL — ABNORMAL HIGH (ref 70–99)
Glucose-Capillary: 131 mg/dL — ABNORMAL HIGH (ref 70–99)

## 2022-02-26 LAB — LACTATE DEHYDROGENASE, PLEURAL OR PERITONEAL FLUID: LD, Fluid: 660 U/L — ABNORMAL HIGH (ref 3–23)

## 2022-02-26 LAB — CBC
HCT: 42.7 % (ref 36.0–46.0)
Hemoglobin: 13.6 g/dL (ref 12.0–15.0)
MCH: 31.3 pg (ref 26.0–34.0)
MCHC: 31.9 g/dL (ref 30.0–36.0)
MCV: 98.4 fL (ref 80.0–100.0)
Platelets: 309 10*3/uL (ref 150–400)
RBC: 4.34 MIL/uL (ref 3.87–5.11)
RDW: 17 % — ABNORMAL HIGH (ref 11.5–15.5)
WBC: 23.4 10*3/uL — ABNORMAL HIGH (ref 4.0–10.5)
nRBC: 0.1 % (ref 0.0–0.2)

## 2022-02-26 LAB — LACTATE DEHYDROGENASE: LDH: 233 U/L — ABNORMAL HIGH (ref 98–192)

## 2022-02-26 LAB — PROCALCITONIN: Procalcitonin: 0.11 ng/mL

## 2022-02-26 LAB — PROTEIN, TOTAL: Total Protein: 7 g/dL (ref 6.5–8.1)

## 2022-02-26 IMAGING — DX DG CHEST 1V PORT
2 series · 2 of 2 positions shown · non-contrast
Comparison: Previous studies including chest radiograph done on
[DATE], CT done on [DATE]

CLINICAL DATA: Shortness of breath, pleural effusion

EXAM:
PORTABLE CHEST 1 VIEW

[chest ap (1 of 2)]
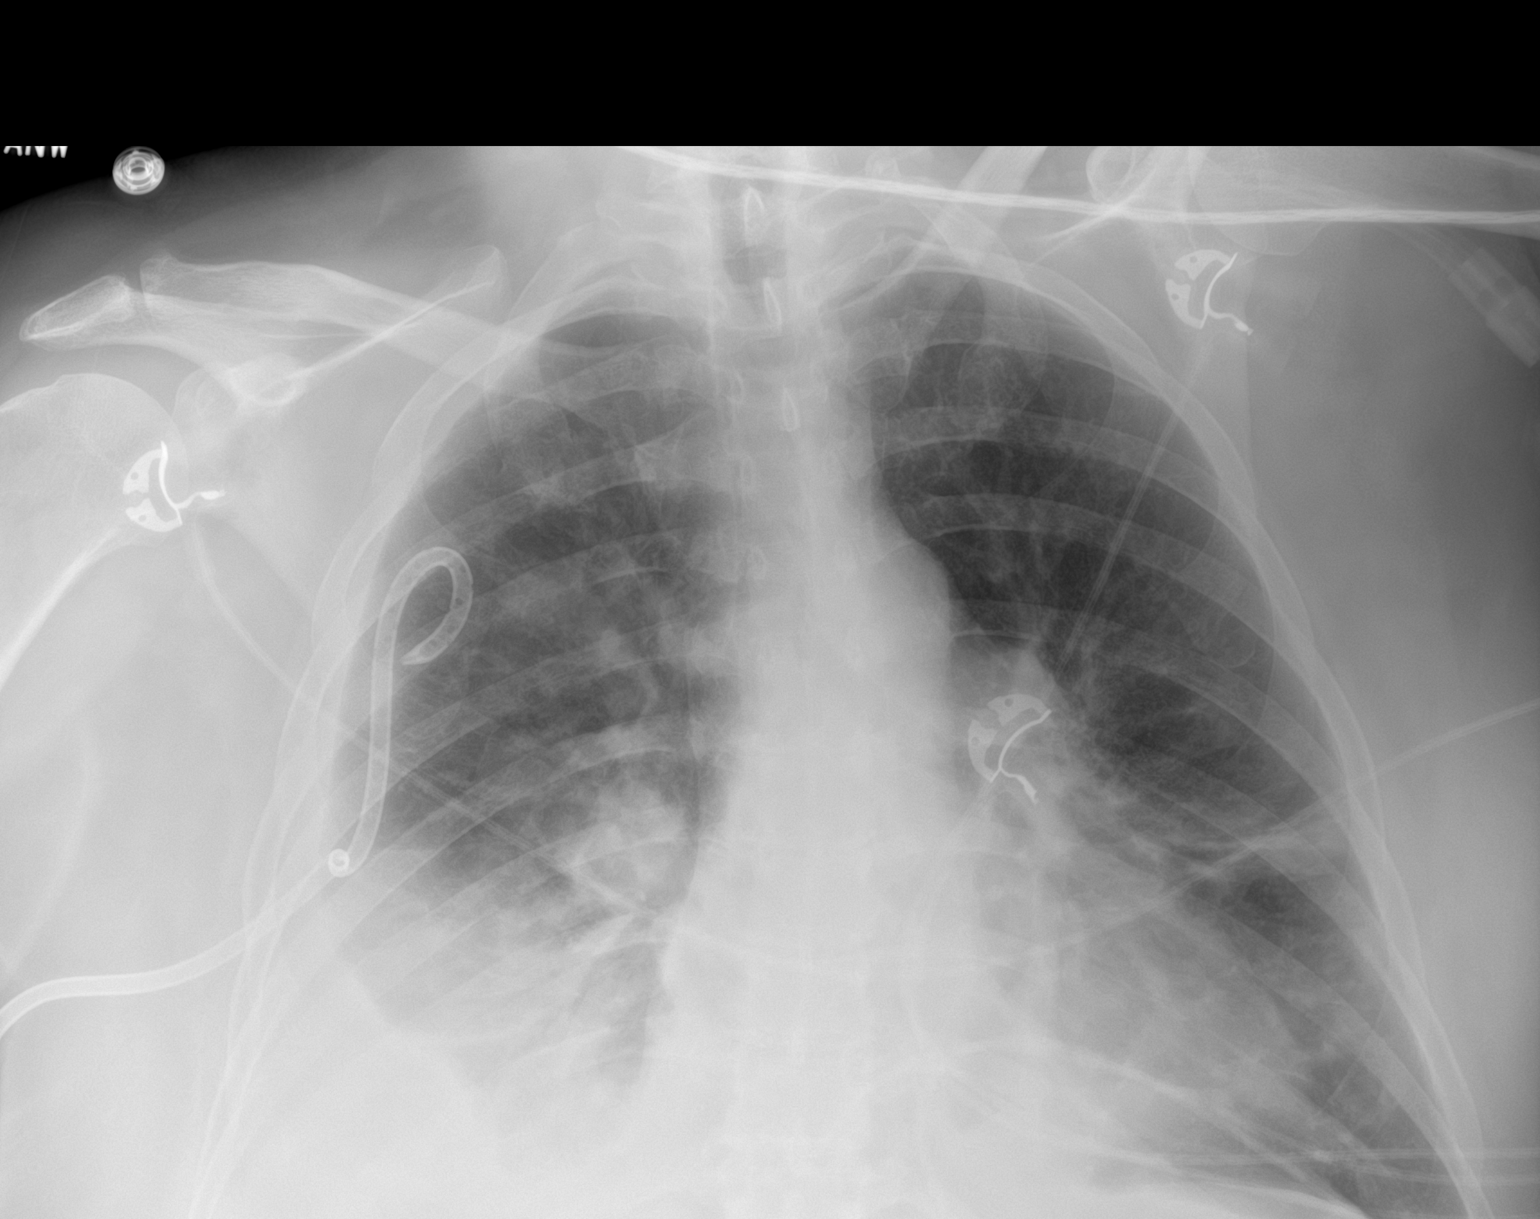

[chest ap (2 of 2)]
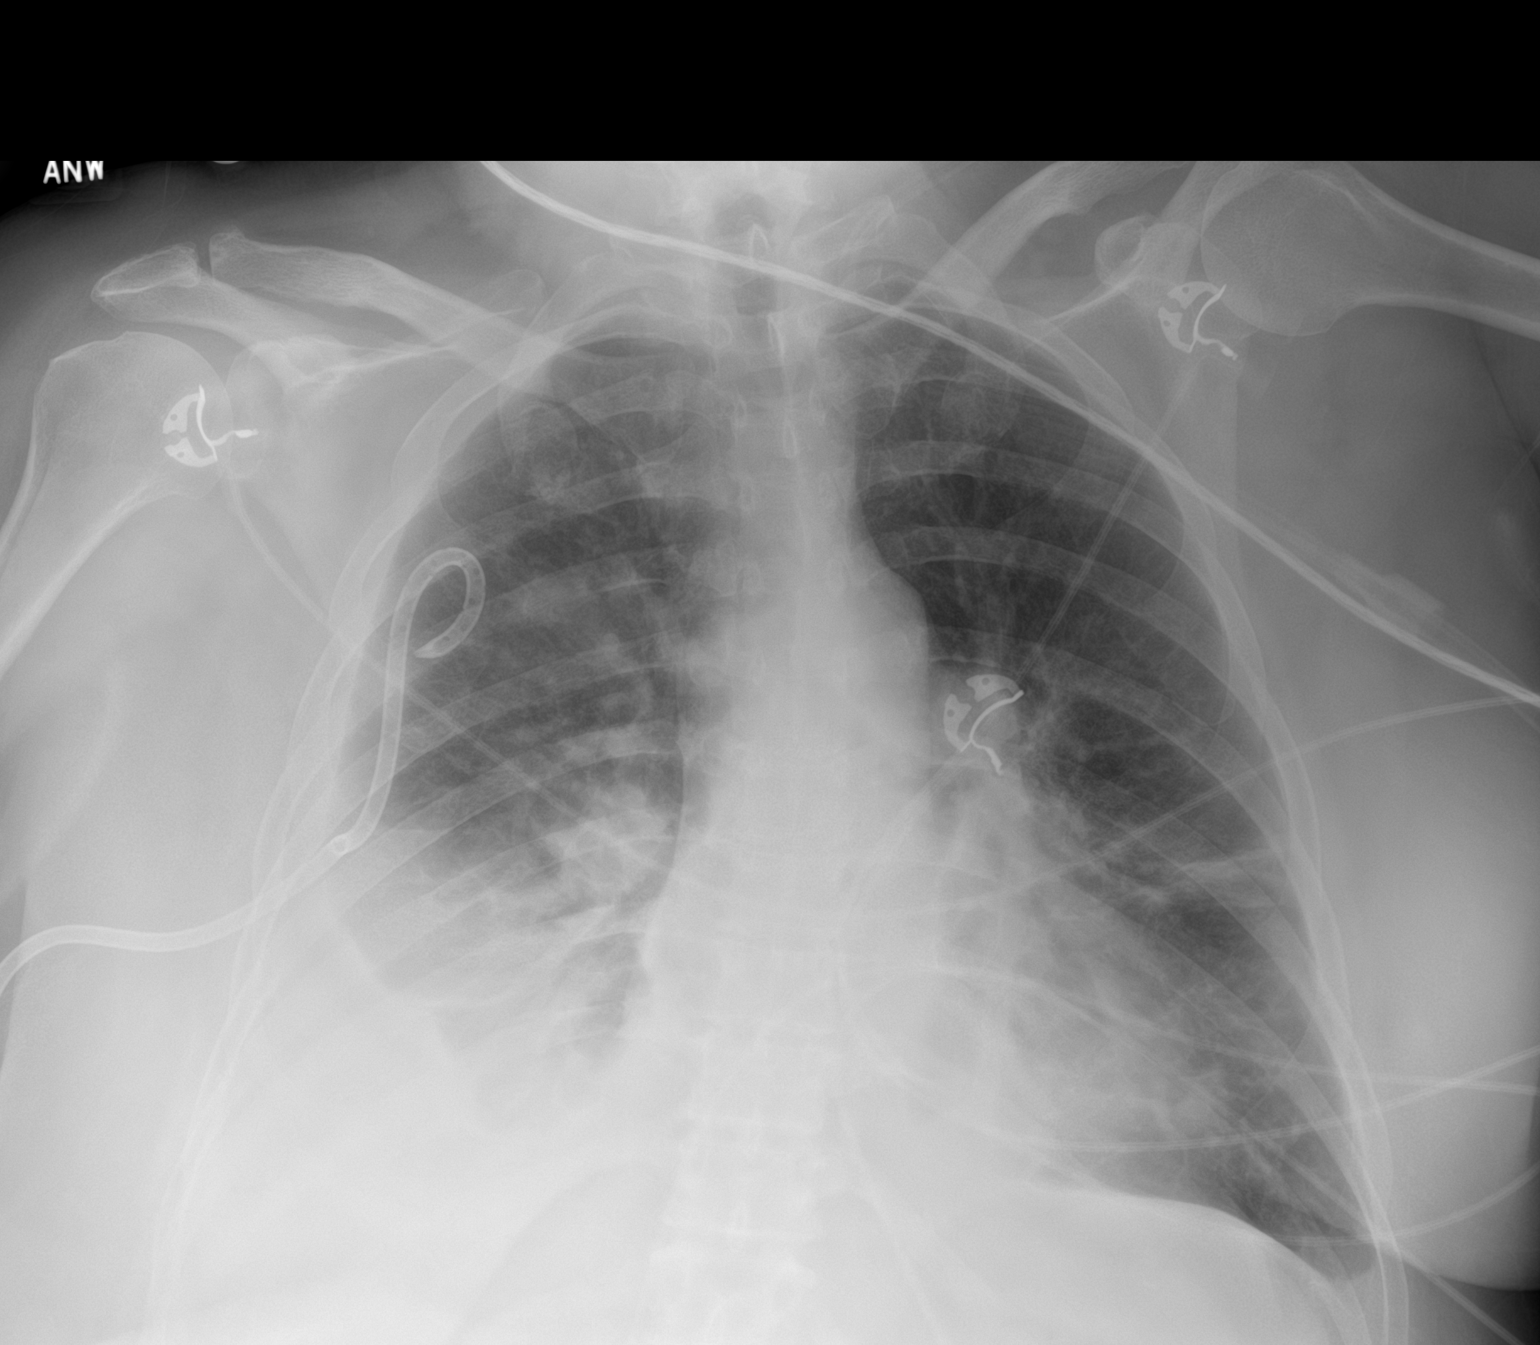

[2 of 2 positions shown; findings below may reference images not displayed]

FINDINGS: There is interval placement of right chest tube. Moderate right
pleural effusion is seen. Small left pleural effusion is seen.
Transverse diameter of heart is slightly increased. There are patchy
infiltrates in the right upper lung fields, both parahilar regions
and both lower lung fields suggesting possible multifocal pneumonia.
There is no pneumothorax.
IMPRESSION: Interval placement of right chest tube. There is interval decrease
in amount of right pleural effusion. There is no pneumothorax. Small
left pleural effusion. Infiltrates in both lungs have not changed
significantly.

## 2022-02-26 MED ORDER — HYDROXYZINE HCL 25 MG PO TABS: 25.0000 mg | ORAL_TABLET | Freq: Every day | ORAL | Status: DC | PRN

## 2022-02-26 MED ORDER — SODIUM CHLORIDE 0.9% FLUSH
10.0000 mL | Freq: Three times a day (TID) | INTRAVENOUS | Status: DC
  Administered 2022-02-26: 10 mL

## 2022-02-26 MED ORDER — PRAMIPEXOLE DIHYDROCHLORIDE 1 MG PO TABS
1.0000 mg | ORAL_TABLET | Freq: Every day | ORAL | Status: DC
  Administered 2022-02-26 – 2022-03-03 (×6): 1 mg via ORAL
  Filled 2022-02-26 (×7): qty 1

## 2022-02-26 MED ORDER — QUETIAPINE FUMARATE 100 MG PO TABS
100.0000 mg | ORAL_TABLET | Freq: Every day | ORAL | Status: DC
  Administered 2022-02-26 – 2022-03-03 (×6): 100 mg via ORAL
  Filled 2022-02-26 (×6): qty 1

## 2022-02-26 MED ORDER — DULOXETINE HCL 60 MG PO CPEP
60.0000 mg | ORAL_CAPSULE | Freq: Every day | ORAL | Status: DC
  Administered 2022-02-26 – 2022-03-04 (×7): 60 mg via ORAL
  Filled 2022-02-26 (×7): qty 1

## 2022-02-26 MED ORDER — LIDOCAINE HCL (PF) 1 % IJ SOLN
INTRAMUSCULAR | Status: AC
  Administered 2022-02-26: 5 mL via INTRADERMAL
  Filled 2022-02-26: qty 5

## 2022-02-26 MED ORDER — ROSUVASTATIN CALCIUM 5 MG PO TABS
10.0000 mg | ORAL_TABLET | Freq: Every day | ORAL | Status: DC
  Administered 2022-02-26 – 2022-03-03 (×6): 10 mg via ORAL
  Filled 2022-02-26 (×6): qty 2

## 2022-02-26 MED ORDER — SODIUM CHLORIDE 0.9% FLUSH
10.0000 mL | Freq: Three times a day (TID) | INTRAVENOUS | Status: DC
  Administered 2022-02-26 – 2022-02-28 (×5): 10 mL

## 2022-02-26 MED ORDER — PANTOPRAZOLE SODIUM 40 MG PO TBEC
40.0000 mg | DELAYED_RELEASE_TABLET | Freq: Every day | ORAL | Status: DC
  Administered 2022-02-26 – 2022-03-04 (×7): 40 mg via ORAL
  Filled 2022-02-26 (×7): qty 1

## 2022-02-26 MED ORDER — OXYCODONE HCL 5 MG PO TABS
5.0000 mg | ORAL_TABLET | ORAL | Status: DC | PRN
  Administered 2022-02-26 – 2022-03-04 (×16): 5 mg via ORAL
  Filled 2022-02-26 (×16): qty 1

## 2022-02-26 MED ORDER — FERROUS SULFATE 325 (65 FE) MG PO TABS
325.0000 mg | ORAL_TABLET | Freq: Two times a day (BID) | ORAL | Status: DC
  Administered 2022-02-26 – 2022-03-04 (×13): 325 mg via ORAL
  Filled 2022-02-26 (×14): qty 1

## 2022-02-26 MED ORDER — POTASSIUM CHLORIDE CRYS ER 20 MEQ PO TBCR
40.0000 meq | EXTENDED_RELEASE_TABLET | Freq: Once | ORAL | Status: AC
  Administered 2022-02-26: 40 meq via ORAL
  Filled 2022-02-26: qty 2

## 2022-02-26 MED ORDER — LIDOCAINE HCL (PF) 1 % IJ SOLN: 5.0000 mL | Freq: Once | INTRAMUSCULAR | Status: AC

## 2022-02-26 MED ORDER — CLONAZEPAM 0.25 MG PO TBDP: 0.5000 mg | ORAL_TABLET | Freq: Two times a day (BID) | ORAL | Status: DC | PRN

## 2022-02-26 MED ORDER — GABAPENTIN 600 MG PO TABS
600.0000 mg | ORAL_TABLET | Freq: Three times a day (TID) | ORAL | Status: DC
  Administered 2022-02-26 – 2022-03-04 (×18): 600 mg via ORAL
  Filled 2022-02-26 (×18): qty 1

## 2022-02-26 NOTE — Progress Notes (Signed)
? ?NAME:  Beverly Tran, MRN:  [id number], DOB:  1971/10/03, LOS: 2 ?ADMISSION DATE:  02/24/2022, CONSULTATION DATE:  02/21/22 ?REFERRING MD:  Emokpae/Triad, CHIEF COMPLAINT:  resp distress   ? ?History of Present Illness:  ?23  yowf quit smoking 01/2020 last seen in pulmonary clinic 03/18/21 with dx cough variant asthma vs VCD flared on ACEi and better off acei and on max gerd rx s hypercarbia but using 02 prn and with rec for pfts which were  never done >>>   admitted on 02/16/2022 ? on ACEi again with dx sepsis secondary to multifocal pneumonia resulting in acute on chronic hypoxic respiratory failure requiring prolonged BiPAP use so PCCM consulted 5/8 rec wean bipap/ 02/ rx empirically for CAP but pt left ama once awake enough to refuse bipap ? ?5/11 returned in am to ER with increase sob at rest and not saturating  above 80% on her home flow reported to be 6lpm (not verified, nor clear what meds she's really taking) - c/o midline mid back pain s pleuritic component, some gen ha but no N or V or Chills or abd pain or dysuria.  W/u showed new mod large R effusion so rec to tap in ER ASAP > c/w empyema confirmed 5/11 so rec trx to [facility] for IR drain and likely will need lytics  ? ? ? ?Pertinent  Medical History  ?DM type 2 with diabetic neuropathy ?HBP ?GERD ?RLS  ? ? ?Significant Hospital Events: ?Including procedures, antibiotic start and stop dates in addition to other pertinent events   ?CTa  5/3 Severe bilateral multifocal pneumonia, most confluent in the right lower lobe.  ?Urine strep/ legionella 5/3 neg  ?MRSA PCR  5/4  POS ?Echo 5/4 ok x for RA est pressure 15  with Nl LA size but RA not well viz ?R Tcentesis 5/11 >>> too organized to drain ?Maxepime/Vanc  5/11 >>> ?CTa 5/12 c/w empyema on R > IR ([place]) rec CT directed drain/likely to need lytics ? ? ? ? ?Scheduled Meds: ? arformoterol  15 mcg Nebulization BID  ? budesonide (PULMICORT) nebulizer solution  0.25 mg Nebulization BID  ? Chlorhexidine Gluconate  Cloth  6 each Topical Q0600  ? heparin  5,000 Units Subcutaneous Q8H  ? insulin aspart  0-15 Units Subcutaneous Q4H  ? ipratropium-albuterol  3 mL Nebulization Q6H  ? mouth rinse  15 mL Mouth Rinse q12n4p  ? mupirocin ointment   Nasal BID  ? pantoprazole (PROTONIX) IV  40 mg Intravenous Q24H  ? sodium chloride flush  10 mL Other Q8H  ? ?Continuous Infusions: ? ceFEPime (MAXIPIME) IV Stopped (02/26/22 [id number])  ? vancomycin 1,500 mg (02/25/22 1602)  ? ?PRN Meds:.acetaminophen, acetaminophen, ondansetron **OR** ondansetron (ZOFRAN) IV  ? ? ?Interim History / Subjective:  ?Sitting in chair this am. Unable to take a full breath ? ?Objective   ?Blood pressure 122/70, pulse 100, temperature 100 ?F (37.8 ?C), resp. rate 14, height 5\' 5"  (1.651 m), weight 99.3 kg, SpO2 93 %. ?   ?FiO2 (%):  [40 %] 40 %  ? ?Intake/Output Summary (Last 24 hours) at 02/26/2022 1037 ?Last data filed at 02/26/2022 [id number] ?Gross per 24 hour  ?Intake 820 ml  ?Output --  ?Net 820 ml  ? ? ?Filed Weights  ? 02/24/22 1146 02/24/22 1445  ?Weight: 103.1 kg 99.3 kg  ? ?Physical Exam: ?General: Well-appearing, no acute distress ?HENT: [place], AT, OP clear, MMM ?Eyes: EOMI, no scleral icterus ?Respiratory: Air entry in left lung and  right upper lobe. Absent breath sounds in mid and lower lungs. No crackles, wheezing or rales ?Cardiovascular: RRR, -M/R/G, no JVD ?GI: BS+, soft, nontender ?Extremities:-Edema,-tenderness ?Neuro: AAO x4, CNII-XII grossly intact ?Skin: Intact, no rashes or bruising ?Psych: Normal mood, normal affect ? ?Bedside US with large right pleural effusion with loculations ? ?Assessment & Plan:  ? ?Acute  hypoxemic and hypercarbic resp failure  ?Left pneumonia with complicated parapneumonic effusion/suspected empyema ?Cough variant asthma ?--Right pigtail placed 5/13 ?--F/u CXR ?--Continue chest tube suction -20 cm H20 ?--Monitor chest tube output ?--RN for chest tube flushes q8h ?--F/u pleural studies: cell count, body fluid culture, LDH, protein,  cytology ?--F/u serum studies: LDH, protein ?--Continue antibiotics. De-escalate pending culture data ?--Wean supplemental O2 for goal 88-92% ?--Continue nebulizers. Will change to inhalers once oxygenation improves ? ?Ok to resume diet. Diabetic diet ordered ? ?Remainder per primary team ? ?Best Practice (right click and "Reselect all SmartList Selections" daily)  ?  ? ?Labs   ?CBC: ?Recent Labs  ?Lab 02/22/22 ?0430 02/23/22 ?0352 02/24/22 ?1218 02/25/22 ?0640 02/26/22 ?[id number]  ?WBC 13.7* 15.1* 19.2* 21.3* 23.4*  ?NEUTROABS  --   --  15.7*  --   --   ?HGB 12.9 12.8 12.7 12.6 13.6  ?HCT 40.7 39.3 39.7 40.3 42.7  ?MCV 96.2 96.6 96.6 98.3 98.4  ?PLT 240 265 277 249 309  ? ? ?Basic Metabolic Panel: ?Recent Labs  ?Lab 02/20/22 ?[id number] 02/22/22 ?0430 02/24/22 ?1218 02/25/22 ?0640 02/26/22 ?[id number]  ?NA 141 139 135 137 136  ?K 3.6 3.7 3.7 3.6 3.4*  ?CL 102 97* 91* 96* 95*  ?CO2 31 33* 35* 33* 30  ?GLUCOSE 230* 198* 144* 113* 101*  ?BUN 19 18 11 9 8   ?CREATININE 0.40* 0.46 0.40* 0.38* 0.53  ?CALCIUM 9.5 9.0 8.4* 8.6* 9.0  ? ?GFR: ?Estimated Creatinine Clearance: 98.1 mL/min (by C-G formula based on SCr of 0.53 mg/dL). ?Recent Labs  ?Lab 02/23/22 ?0352 02/24/22 ?1218 02/25/22 ?0640 02/26/22 ?[id number]  ?PROCALCITON  --  <0.10 <0.10 0.11  ?WBC 15.1* 19.2* 21.3* 23.4*  ? ? ?Liver Function Tests: ?Recent Labs  ?Lab 02/24/22 ?1218 02/25/22 ?0640 02/26/22 ?[id number]  ?AST 17 15 16   ?ALT 26 22 21   ?ALKPHOS 48 50 54  ?BILITOT 0.7 0.6 0.8  ?PROT 6.4* 6.6 7.1  ?ALBUMIN 2.8* 2.7* 2.5*  ? ?No results for input(s): LIPASE, AMYLASE in the last 168 hours. ?No results for input(s): AMMONIA in the last 168 hours. ? ?ABG ?   ?Component Value Date/Time  ? PHART 7.54 (H) 02/24/2022 1207  ? PCO2ART 52 (H) 02/24/2022 1207  ? PO2ART 64 (L) 02/24/2022 1207  ? HCO3 44.6 (H) 02/24/2022 1207  ? TCO2 22 08/18/2010 2152  ? O2SAT 91.6 02/24/2022 1207  ?  ? ?Coagulation Profile: ?No results for input(s): INR, PROTIME in the last 168 hours. ? ? ?Cardiac Enzymes: ?No  results for input(s): CKTOTAL, CKMB, CKMBINDEX, TROPONINI in the last 168 hours. ? ?HbA1C: ?Hgb A1c MFr Bld  ?Date/Time Value Ref Range Status  ?02/16/2022 05:49 PM 7.0 (H) 4.8 - 5.6 % Final  ?  Comment:  ?  (NOTE) ?Pre diabetes:          5.7%-6.4% ? ?Diabetes:              >6.4% ? ?Glycemic control for   <7.0% ?adults with diabetes ?  ? ? ?CBG: ?Recent Labs  ?Lab 02/25/22 ?1519 02/25/22 ?1959 02/25/22 ?2353 02/26/22 ?[id number] 02/26/22 ?0747  ?GLUCAP 123* 112* 183* 108* 130*  ? ?  Care Time: 50 min. Independent of procedures. Updated patient and coordinated care with bedside RN and rapid response RN. ? ?Rodman Pickle, M.D. ?[facility] ?02/26/2022 10:37 AM  ? ?See Amion for personal pager ?For hours between 7 PM to 7 AM, please call Elink for urgent questions ? ?

## 2022-02-26 NOTE — Procedures (Addendum)
Insertion of Chest Tube Procedure Note ? ?Tye Maryland  ?[id number]  ?Feb 23, 1971 ? ?Date:02/26/22  ?Time:10:38 AM  ? ? ?Provider Performing: Jenni Thew Mechele Collin, MD ? ?Procedure: Chest Tube Insertion (73532) ? ?Indication(s) ?Effusion ? ?Consent ?Risks of the procedure as well as the alternatives and risks of each were explained to the patient and/or caregiver.  Consent for the procedure was obtained and is signed in the bedside chart ? ?Anesthesia ?Topical only with 1% lidocaine . 10cc ? ? ?Time Out ?Verified patient identification, verified procedure, site/side was marked, verified correct patient position, special equipment/implants available, medications/allergies/relevant history reviewed, required imaging and test results available. ? ? ?Sterile Technique ?Maximal sterile technique including full sterile barrier drape, hand hygiene, sterile gown, sterile gloves, mask, hair covering, sterile ultrasound probe cover (if used). ? ? ?Procedure Description ?Ultrasound used to identify appropriate pleural anatomy for placement and overlying skin marked. Area of placement cleaned and draped in sterile fashion.  A 14 French pigtail pleural catheter was placed into the right pleural space using Seldinger technique. Appropriate return of straw colored fluid with trace/small red clots was obtained.  The tube was connected to atrium and placed on -20 cm H2O wall suction. ? ? ? ?Complications/Tolerance ?None; patient tolerated the procedure well. ?Chest X-ray is ordered to verify placement. ? ? ?EBL ?Minimal ? ?Specimen(s) ?none ?

## 2022-02-26 NOTE — Progress Notes (Signed)
?PROGRESS NOTE ? ?SNOW [place]OPLES TKT:[id number] DOB: [birth year] DOA: 02/24/2022 ?PCP: [facility] ? ? LOS: 2 days  ? ?Brief Narrative / Interim history: ?51 year old female with history of COPD and chronic hypoxic respiratory failure on 5 L of oxygen at home, admitted to the hospital with acute on chronic respiratory failure.  Imaging on admission showed a right-sided pleural effusion, multiloculated, concerning for empyema.  She was placed on IV antibiotics and pulmonary were consulted.  Initially she was admitted to [facility], however she was transferred to [facility] for drain placement and potential lytics. ? ?Subjective / 24h Interval events: ?She is doing well this morning, complains of mild shortness of breath.  No chest pain, no abdominal pain, no nausea or vomiting. ? ?Assesement and Plan: ?Principal Problem: ?  Acute on chronic respiratory failure ([place]) ?Active Problems: ?  Essential hypertension ?  Gastroesophageal reflux disease without esophagitis ?  Sepsis ([place]) ?  Hyperglycemia due to diabetes mellitus ([place]) ?  Mixed hyperlipidemia ?  Restless leg syndrome ?  Diabetic neuropathy ([place]) ?  COPD (chronic obstructive pulmonary disease) ([place]) ?  Acute metabolic encephalopathy ?  HCAP (healthcare-associated pneumonia) ?  Anxiety ?  Empyema of right pleural space (HCC) ? ? ?Principal problem ?Sepsis due to probable empyema-patient met sepsis criteria on admission with fever up to 102.1, tachycardia, tachypnea and an elevated white count.  Continue broad-spectrum antibiotics.  Pulmonary consulted, she is status post chest tube placement this morning 5/13 by Dr. Loanne Drilling.  Pleural studies pending ? ?Active problems ?Acute on chronic hypoxic respiratory failure due to #1 -respiratory status is stable.  He required BiPAP on admission, currently has been weaned off to 6 L nasal cannula, she appears comfortable, this is not far off her baseline. ? ?COPD-continue nebulizers,  no wheezing this morning ? ?Hypokalemia-replete and continue to monitor ? ?Anxiety-resume home medications ? ?Essential hypertension-blood pressure normal currently, in the setting of sepsis hold home agents for now, resume as appropriate ? ?Hyperlipidemia-continue statin ? ?Iron deficiency anemia-resume home iron ? ?Diabetes mellitus-continue sliding scale, monitor CBGs.  Continue gabapentin for her diabetic neuropathy ? ?CBG (last 3)  ?Recent Labs  ?  02/26/22 ?5146 02/26/22 ?0747 02/26/22 ?1056  ?GLUCAP 108* 130* 93  ? ? ?Scheduled Meds: ? arformoterol  15 mcg Nebulization BID  ? budesonide (PULMICORT) nebulizer solution  0.25 mg Nebulization BID  ? Chlorhexidine Gluconate Cloth  6 each Topical Q0600  ? DULoxetine  60 mg Oral Daily  ? ferrous sulfate  325 mg Oral BID  ? gabapentin  600 mg Oral TID  ? heparin  5,000 Units Subcutaneous Q8H  ? insulin aspart  0-15 Units Subcutaneous Q4H  ? ipratropium-albuterol  3 mL Nebulization Q6H  ? mouth rinse  15 mL Mouth Rinse q12n4p  ? mupirocin ointment   Nasal BID  ? pantoprazole  40 mg Oral Daily  ? potassium chloride  40 mEq Oral Once  ? pramipexole  1 mg Oral QHS  ? QUEtiapine  100 mg Oral QHS  ? rosuvastatin  10 mg Oral QHS  ? sodium chloride flush  10 mL Other Q8H  ? ?Continuous Infusions: ? ceFEPime (MAXIPIME) IV Stopped (02/26/22 0479)  ? vancomycin 1,500 mg (02/25/22 1602)  ? ?PRN Meds:.acetaminophen, acetaminophen, ondansetron **OR** ondansetron (ZOFRAN) IV ? ?Diet Orders (From admission, onward)  ? ?  Start     Ordered  ? 02/26/22 1027  Diet Carb Modified Fluid consistency: Thin; Room service appropriate? Yes  Diet effective  now       ?Question Answer Comment  ?Diet-HS Snack? Nothing   ?Calorie Level Medium 1600-2000   ?Fluid consistency: Thin   ?Room service appropriate? Yes   ?  ? 02/26/22 1026  ? ?  ?  ? ?  ? ? ?DVT prophylaxis: heparin injection 5,000 Units Start: 02/24/22 2200 ? ? ?Lab Results  ?Component Value Date  ? PLT 309 02/26/2022  ? ? ?  Code Status:  Full Code ? ?Family Communication: no family at bedside  ? ?Status is: Inpatient ? ?Remains inpatient appropriate because: severity of illness ? ?Level of care: Progressive ? ?Consultants:  ?Pulmonology ? ?Procedures:  ?Chest tube placement 5/13 ? ?Microbiology  ?Blood cultures 5/11 >> no growth to date ? ?Antimicrobials: ?Vancomycin 5/11  ?Cefepime 5/11 ? ? ?Objective: ?Vitals:  ? 02/26/22 0752 02/26/22 1000 02/26/22 1031 02/26/22 1059  ?BP:   122/70 114/74  ?Pulse: (!) 108 98 100 97  ?Resp: $Remov'17 19 14 20  'JklNac$ ?Temp: 100 ?F (37.8 ?C)   98.5 ?F (36.9 ?C)  ?TempSrc:    Oral  ?SpO2:  94% 93% 91%  ?Weight:      ?Height:      ? ? ?Intake/Output Summary (Last 24 hours) at 02/26/2022 1146 ?Last data filed at 02/26/2022 1130 ?Gross per 24 hour  ?Intake 820 ml  ?Output 450 ml  ?Net 370 ml  ? ?Wt Readings from Last 3 Encounters:  ?02/24/22 99.3 kg  ?02/23/22 103.1 kg  ?04/06/21 99.7 kg  ? ? ?Examination: ? ?Constitutional: NAD ?Eyes: no scleral icterus ?ENMT: Mucous membranes are moist.  ?Neck: normal, supple ?Respiratory: Diminished at the bases, no wheezing.  Tachypneic at times ?Cardiovascular: Regular rate and rhythm, no murmurs / rubs / gallops.  Trace edema.  Tachycardic ?Abdomen: non distended, no tenderness. Bowel sounds positive.  ?Musculoskeletal: no clubbing / cyanosis.  ?Skin: no rashes ?Neurologic: non focal  ? ? ?Data Reviewed: I have independently reviewed following labs and imaging studies  ? ?CBC ?Recent Labs  ?Lab 02/22/22 ?0430 02/23/22 ?0352 02/24/22 ?1218 02/25/22 ?0640 02/26/22 ?4818  ?WBC 13.7* 15.1* 19.2* 21.3* 23.4*  ?HGB 12.9 12.8 12.7 12.6 13.6  ?HCT 40.7 39.3 39.7 40.3 42.7  ?PLT 240 265 277 249 309  ?MCV 96.2 96.6 96.6 98.3 98.4  ?MCH 30.5 31.4 30.9 30.7 31.3  ?MCHC 31.7 32.6 32.0 31.3 31.9  ?RDW 16.8* 17.0* 16.9* 17.1* 17.0*  ?LYMPHSABS  --   --  2.1  --   --   ?MONOABS  --   --  0.9  --   --   ?EOSABS  --   --  0.1  --   --   ?BASOSABS  --   --  0.0  --   --   ? ? ?Recent Labs  ?Lab 02/20/22 ?5631  02/22/22 ?0430 02/24/22 ?1218 02/25/22 ?0640 02/26/22 ?4970  ?NA 141 139 135 137 136  ?K 3.6 3.7 3.7 3.6 3.4*  ?CL 102 97* 91* 96* 95*  ?CO2 31 33* 35* 33* 30  ?GLUCOSE 230* 198* 144* 113* 101*  ?BUN $Rem'19 18 11 9 8  'VkgW$ ?CREATININE 0.40* 0.46 0.40* 0.38* 0.53  ?CALCIUM 9.5 9.0 8.4* 8.6* 9.0  ?AST  --   --  $R'17 15 16  'dw$ ?ALT  --   --  $R'26 22 21  'NG$ ?ALKPHOS  --   --  48 50 54  ?BILITOT  --   --  0.7 0.6 0.8  ?ALBUMIN  --   --  2.8* 2.7*  2.5*  ?PROCALCITON  --   --  <0.10 <0.10 0.11  ?TSH  --   --   --  1.057  --   ?BNP  --   --  80.0  --   --   ? ? ?------------------------------------------------------------------------------------------------------------------ ?No results for input(s): CHOL, HDL, LDLCALC, TRIG, CHOLHDL, LDLDIRECT in the last 72 hours. ? ?Lab Results  ?Component Value Date  ? HGBA1C 7.0 (H) 02/16/2022  ? ?------------------------------------------------------------------------------------------------------------------ ?Recent Labs  ?  02/25/22 ?0640  ?TSH 1.057  ? ? ?Cardiac Enzymes ?No results for input(s): CKMB, TROPONINI, MYOGLOBIN in the last 168 hours. ? ?Invalid input(s): CK ?------------------------------------------------------------------------------------------------------------------ ?   ?Component Value Date/Time  ? BNP 80.0 02/24/2022 1218  ? ? ?CBG: ?Recent Labs  ?Lab 02/25/22 ?1959 02/25/22 ?2353 02/26/22 ?5996 02/26/22 ?0747 02/26/22 ?1056  ?GLUCAP 112* 183* 108* 130* 93  ? ? ?Recent Results (from the past 240 hour(s))  ?Blood Culture (routine x 2)     Status: None  ? Collection Time: 02/16/22  6:02 PM  ? Specimen: Left Antecubital; Blood  ?Result Value Ref Range Status  ? Specimen Description LEFT ANTECUBITAL  Final  ? Special Requests   Final  ?  BOTTLES DRAWN AEROBIC AND ANAEROBIC Blood Culture adequate volume  ? Culture   Final  ?  NO GROWTH 5 DAYS ?Performed at [facility], [address], [place], [place] [address] ?  ? Report Status 02/21/2022 FINAL  Final  ?Blood Culture (routine x 2)      Status: None  ? Collection Time: 02/16/22  6:03 PM  ? Specimen: BLOOD LEFT FOREARM  ?Result Value Ref Range Status  ? Specimen Description BLOOD LEFT FOREARM  Final  ? Special Requests   Final  ?  BOTTLE

## 2022-02-27 ENCOUNTER — Inpatient Hospital Stay (HOSPITAL_COMMUNITY): Payer: 59

## 2022-02-27 DIAGNOSIS — J9622 Acute and chronic respiratory failure with hypercapnia: Secondary | ICD-10-CM | POA: Diagnosis not present

## 2022-02-27 DIAGNOSIS — J90 Pleural effusion, not elsewhere classified: Secondary | ICD-10-CM | POA: Diagnosis not present

## 2022-02-27 DIAGNOSIS — J9621 Acute and chronic respiratory failure with hypoxia: Secondary | ICD-10-CM | POA: Diagnosis not present

## 2022-02-27 LAB — BASIC METABOLIC PANEL
Anion gap: 11 (ref 5–15)
BUN: 10 mg/dL (ref 6–20)
CO2: 25 mmol/L (ref 22–32)
Calcium: 8.3 mg/dL — ABNORMAL LOW (ref 8.9–10.3)
Chloride: 99 mmol/L (ref 98–111)
Creatinine, Ser: 0.49 mg/dL (ref 0.44–1.00)
GFR, Estimated: >60 mL/min (ref >60)
Glucose, Bld: 115 mg/dL — ABNORMAL HIGH (ref 70–99)
Potassium: 3.4 mmol/L — ABNORMAL LOW (ref 3.5–5.1)
Sodium: 135 mmol/L (ref 135–145)

## 2022-02-27 LAB — CBC
HCT: 34.2 % — ABNORMAL LOW (ref 36.0–46.0)
Hemoglobin: 10.9 g/dL — ABNORMAL LOW (ref 12.0–15.0)
MCH: 31.2 pg (ref 26.0–34.0)
MCHC: 31.9 g/dL (ref 30.0–36.0)
MCV: 98 fL (ref 80.0–100.0)
Platelets: 319 10*3/uL (ref 150–400)
RBC: 3.49 MIL/uL — ABNORMAL LOW (ref 3.87–5.11)
RDW: 16.9 % — ABNORMAL HIGH (ref 11.5–15.5)
WBC: 14.4 10*3/uL — ABNORMAL HIGH (ref 4.0–10.5)
nRBC: 0 % (ref 0.0–0.2)

## 2022-02-27 LAB — GLUCOSE, CAPILLARY
Glucose-Capillary: 101 mg/dL — ABNORMAL HIGH (ref 70–99)
Glucose-Capillary: 105 mg/dL — ABNORMAL HIGH (ref 70–99)
Glucose-Capillary: 129 mg/dL — ABNORMAL HIGH (ref 70–99)
Glucose-Capillary: 180 mg/dL — ABNORMAL HIGH (ref 70–99)
Glucose-Capillary: 229 mg/dL — ABNORMAL HIGH (ref 70–99)
Glucose-Capillary: 96 mg/dL (ref 70–99)

## 2022-02-27 IMAGING — DX DG CHEST 1V PORT
1 series · 2 of 2 positions shown · non-contrast
Comparison: [DATE]

CLINICAL DATA: Chest pain and shortness of breath.

EXAM:
PORTABLE CHEST 1 VIEW

[Series 1: chest · 0.14mm/px · 2 of 2 slices shown]
[im 1/2]
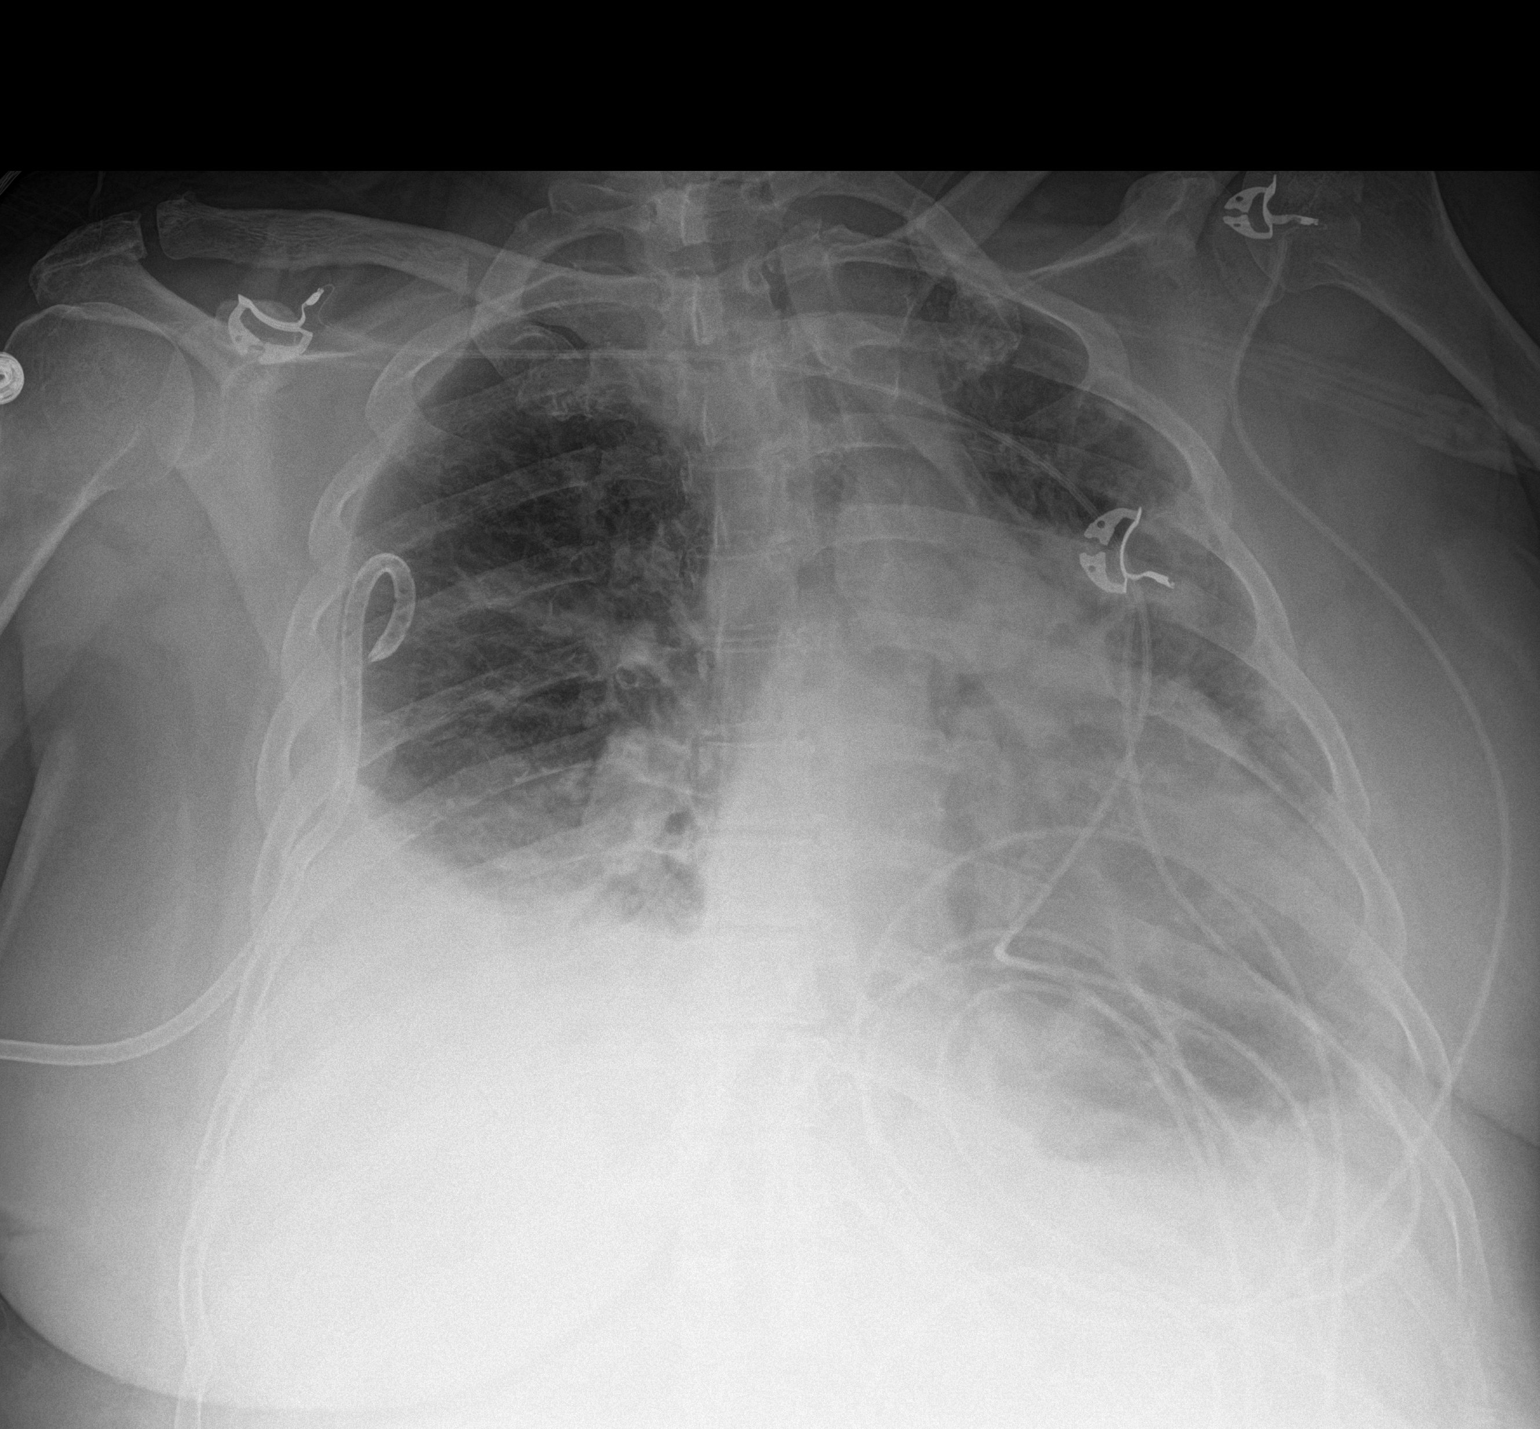
[im 2/2]
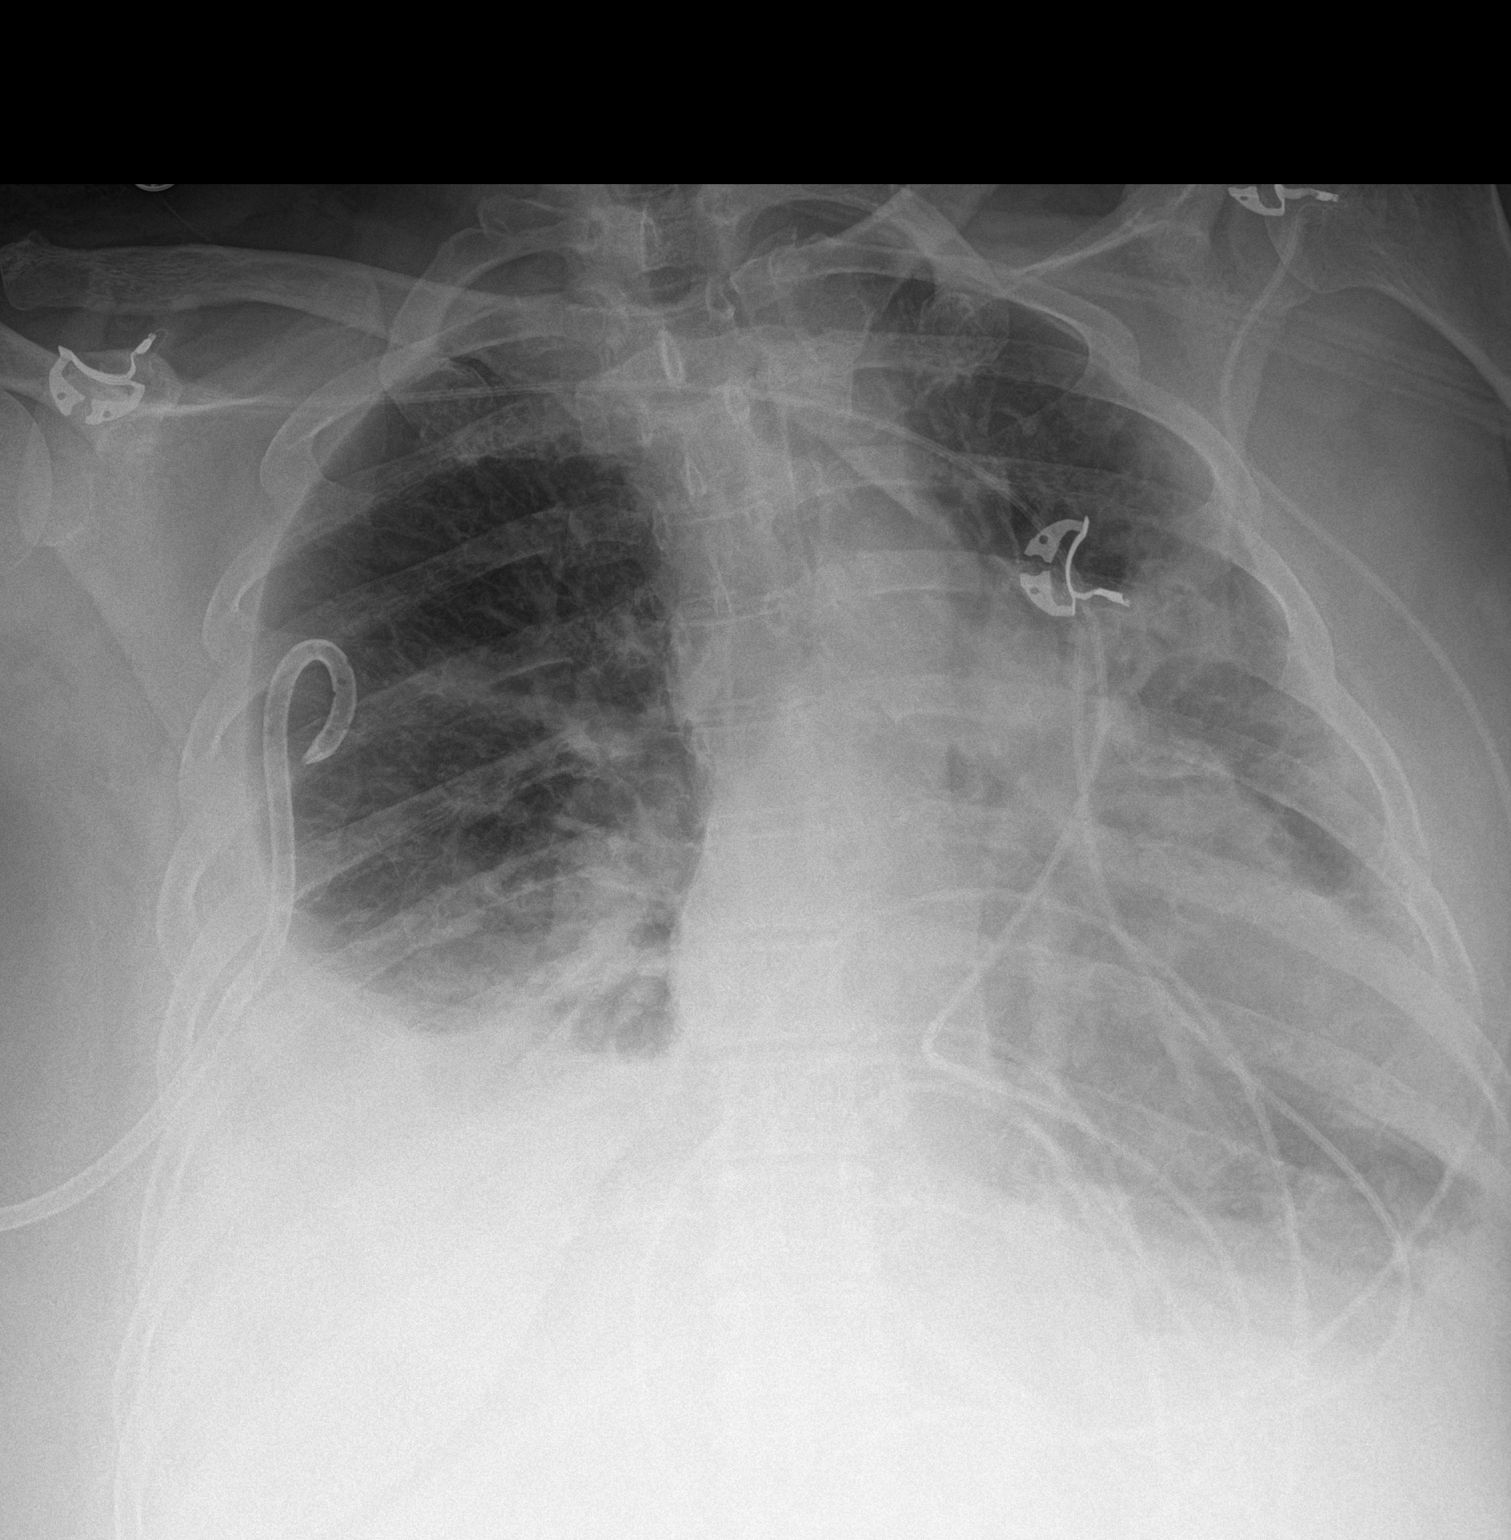

[2 of 2 positions shown; findings below may reference images not displayed]

FINDINGS: Right pleural drain again noted without evidence for pneumothorax.
Right base collapse/consolidation with effusion is similar to prior.
Retrocardiac collapse/consolidation is stable. The cardio
pericardial silhouette is enlarged. Telemetry leads overlie the
chest.
IMPRESSION: No substantial interval change in exam.

## 2022-02-27 MED ORDER — INSULIN ASPART 100 UNIT/ML IJ SOLN
0.0000 [IU] | Freq: Three times a day (TID) | INTRAMUSCULAR | Status: DC
  Administered 2022-02-27: 3 [IU] via SUBCUTANEOUS
  Administered 2022-02-27: 5 [IU] via SUBCUTANEOUS
  Administered 2022-02-28: 2 [IU] via SUBCUTANEOUS
  Administered 2022-02-28: 3 [IU] via SUBCUTANEOUS
  Administered 2022-02-28: 2 [IU] via SUBCUTANEOUS
  Administered 2022-02-28: 3 [IU] via SUBCUTANEOUS
  Administered 2022-03-01: 2 [IU] via SUBCUTANEOUS
  Administered 2022-03-01: 5 [IU] via SUBCUTANEOUS
  Administered 2022-03-01: 2 [IU] via SUBCUTANEOUS
  Administered 2022-03-01: 3 [IU] via SUBCUTANEOUS
  Administered 2022-03-02: 5 [IU] via SUBCUTANEOUS
  Administered 2022-03-02: 2 [IU] via SUBCUTANEOUS
  Administered 2022-03-02: 3 [IU] via SUBCUTANEOUS
  Administered 2022-03-02: 2 [IU] via SUBCUTANEOUS
  Administered 2022-03-03 – 2022-03-04 (×4): 3 [IU] via SUBCUTANEOUS
  Administered 2022-03-04: 11 [IU] via SUBCUTANEOUS

## 2022-02-27 MED ORDER — POTASSIUM CHLORIDE CRYS ER 20 MEQ PO TBCR
40.0000 meq | EXTENDED_RELEASE_TABLET | Freq: Once | ORAL | Status: AC
  Administered 2022-02-27: 40 meq via ORAL
  Filled 2022-02-27: qty 2

## 2022-02-27 MED ORDER — SODIUM CHLORIDE (PF) 0.9 % IJ SOLN
10.0000 mg | Freq: Once | INTRAMUSCULAR | Status: AC
  Administered 2022-02-27: 10 mg via INTRAPLEURAL
  Filled 2022-02-27: qty 10

## 2022-02-27 MED ORDER — STERILE WATER FOR INJECTION IJ SOLN
5.0000 mg | Freq: Once | RESPIRATORY_TRACT | Status: AC
  Administered 2022-02-27: 5 mg via INTRAPLEURAL
  Filled 2022-02-27: qty 5

## 2022-02-27 MED ORDER — SODIUM CHLORIDE 0.9% FLUSH
10.0000 mL | Freq: Three times a day (TID) | INTRAVENOUS | Status: DC
  Administered 2022-02-27 – 2022-02-28 (×3): 10 mL

## 2022-02-27 NOTE — Procedures (Signed)
?  Pleural Fibrinolytic Administration Procedure Note ? ?Tye Maryland  ?[id number]  ?1971/01/11 ? ?Date:02/27/22  ?Time:8:18 PM  ? ?Provider Performing:Darrill Vreeland Hezzie Bump  ? ?Procedure: Pleural Fibrinolysis Initial day (09983) second dose. ? ?Indication(s) ?Fibrinolysis of complicated pleural effusion ? ?Consent ?Risks of the procedure as well as the alternatives and risks of each were explained to the patient and/or caregiver.  Consent for the procedure was obtained. ? ? ?Anesthesia ?None ? ? ?Time Out ?Verified patient identification, verified procedure, site/side was marked, verified correct patient position, special equipment/implants available, medications/allergies/relevant history reviewed, required imaging and test results available. ? ? ?Sterile Technique ?Hand hygiene, gloves ? ? ?Procedure Description ?Existing pleural catheter was cleaned and accessed in sterile manner.  10mg  of tPA in 30cc of saline and 5mg  of dornase in 30cc of sterile water were injected into pleural space using existing pleural catheter.  Catheter will be clamped for 1 hour and then placed back to suction. ? ?Unclamp time 21:15. Charge at bedside. Communicated to unclamp tube at 2115 and place back to suction. Toniya Rozar expressed understanding. ? ? ?Complications/Tolerance ?None; patient tolerated the procedure well. ? ? ?EBL ?None ? ? ?Specimen(s) ?None ? ?Office manager., MSN, APRN, AGACNP-BC ?[facility]  ?02/27/2022 , 8:18 PM ? ?Please see [email or web address] for pager details ? ?If no response, please call [phone] ?After hours, please call Elink at [phone]  ? ?

## 2022-02-27 NOTE — Procedures (Signed)
Pleural Fibrinolytic Administration Procedure Note ? ?Deitra Mayo  ?[id number]  ?04/01/1971 ? ?Date:02/27/22  ?Time:9:41 AM  ? ?Provider Performing:Vasili Fok Rodman Pickle  ? ?Procedure: Pleural Fibrinolysis Initial day [id number]) ? ?Indication(s) ?Fibrinolysis of complicated pleural effusion ? ?Consent ?Risks of the procedure as well as the alternatives and risks of each were explained to the patient and/or caregiver.  Consent for the procedure was obtained. ? ? ?Anesthesia ?None ? ? ?Time Out ?Verified patient identification, verified procedure, site/side was marked, verified correct patient position, special equipment/implants available, medications/allergies/relevant history reviewed, required imaging and test results available. ? ? ?Sterile Technique ?Hand hygiene, gloves ? ? ?Procedure Description ?Existing pleural catheter was cleaned and accessed in sterile manner.  10mg  of tPA in 30cc of saline and 5mg  of dornase in 30cc of sterile water were injected into pleural space using existing pleural catheter.  Catheter will be clamped for 1 hour and then placed back to suction. ? ? ?Complications/Tolerance ?None; patient tolerated the procedure well. ? ? ?EBL ?None ? ? ?Specimen(s) ?None ? ? ?

## 2022-02-27 NOTE — Progress Notes (Signed)
? ?NAME:  Beverly Tran, MRN:  [id number], DOB:  January 05, 1971, LOS: 3 ?ADMISSION DATE:  02/24/2022, CONSULTATION DATE:  02/21/22 ?REFERRING MD:  Emokpae/Triad, CHIEF COMPLAINT:  resp distress   ? ?History of Present Illness:  ?51  yowf quit smoking 01/2020 last seen in pulmonary clinic 03/18/21 with dx cough variant asthma vs VCD flared on ACEi and better off acei and on max gerd rx s hypercarbia but using 02 prn and with rec for pfts which were  never done >>>   admitted on 02/16/2022 ? on ACEi again with dx sepsis secondary to multifocal pneumonia resulting in acute on chronic hypoxic respiratory failure requiring prolonged BiPAP use so PCCM consulted 5/8 rec wean bipap/ 02/ rx empirically for CAP but pt left ama once awake enough to refuse bipap ? ?5/11 returned in am to ER with increase sob at rest and not saturating  above 80% on her home flow reported to be 6lpm (not verified, nor clear what meds she's really taking) - c/o midline mid back pain s pleuritic component, some gen ha but no N or V or Chills or abd pain or dysuria.  W/u showed new mod large R effusion so rec to tap in ER ASAP > c/w empyema confirmed 5/11 so [facility] and likely will need lytics  ? ? ? ?Pertinent  Medical History  ?DM type 2 with diabetic neuropathy ?HBP ?GERD ?RLS  ? ? ?Significant Hospital Events: ?Including procedures, antibiotic start and stop dates in addition to other pertinent events   ?CTa  5/3 Severe bilateral multifocal pneumonia, most confluent in the right lower lobe.  ?Urine strep/ legionella 5/3 neg  ?MRSA PCR  5/4  POS ?Echo 5/4 ok x for RA est pressure 15  with Nl LA size but RA not well viz ?R Tcentesis 5/11 >>> too organized to drain ?Maxepime/Vanc  5/11 >>> ?CTa 5/12 c/w empyema on R > IR ([facility]) rec CT directed drain/likely to need lytics ? ? ? ? ?Scheduled Meds: ? arformoterol  15 mcg Nebulization BID  ? budesonide (PULMICORT) nebulizer solution  0.25 mg Nebulization BID  ? Chlorhexidine Gluconate  Cloth  6 each Topical Q0600  ? DULoxetine  60 mg Oral Daily  ? ferrous sulfate  325 mg Oral BID  ? gabapentin  600 mg Oral TID  ? heparin  5,000 Units Subcutaneous Q8H  ? insulin aspart  0-15 Units Subcutaneous Q4H  ? ipratropium-albuterol  3 mL Nebulization Q6H  ? mouth rinse  15 mL Mouth Rinse q12n4p  ? mupirocin ointment   Nasal BID  ? pantoprazole  40 mg Oral Daily  ? pramipexole  1 mg Oral QHS  ? QUEtiapine  100 mg Oral QHS  ? rosuvastatin  10 mg Oral QHS  ? sodium chloride flush  10 mL Other Q8H  ? ?Continuous Infusions: ? ceFEPime (MAXIPIME) IV 2 g (02/27/22 0606)  ? vancomycin 1,500 mg (02/26/22 1636)  ? ?PRN Meds:.acetaminophen, acetaminophen, clonazePAM, hydrOXYzine, ondansetron **OR** ondansetron (ZOFRAN) IV, oxyCODONE  ? ? ?Interim History / Subjective:  ?Sitting in chair. Reports breathing improved. ? ?Objective   ?Blood pressure 120/70, pulse 94, temperature 98.3 ?F (36.8 ?C), temperature source Oral, resp. rate 18, height 5\' 5"  (1.651 m), weight 99.3 kg, SpO2 91 %. ?   ?   ? ?Intake/Output Summary (Last 24 hours) at 02/27/2022 0831 ?Last data filed at 02/27/2022 0744 ?Gross per 24 hour  ?Intake 1729.83 ml  ?Output 1250 ml  ?Net 479.83  ml  ? ? ?Filed Weights  ? 02/24/22 1146 02/24/22 1445  ?Weight: 103.1 kg 99.3 kg  ? ?Physical Exam: ?General: Well-appearing, no acute distress ?HENT: [place], AT, OP clear, MMM ?Eyes: EOMI, no scleral icterus ?Respiratory: Improved right upper lobe air entry. Diminished right mid-lower lobe air entry.  No crackles, wheezing or rales ?Cardiovascular: RRR, -M/R/G, no JVD ?GI: BS+, soft, nontender ?Extremities:-Edema,-tenderness ?Neuro: AAO x4, CNII-XII grossly intact ?Skin: Intact, no rashes or bruising ?Psych: Normal mood, normal affect ? ?Bedside US 5/13 with large right pleural effusion with loculations ? ?Improved leukocytosis ?Pl WBC 486 N 56 L 41 ?Pl LDH 660/233 ?Pl glucose 87 ?Ratio Protein 3.9/7 ?Culture NGTD ?Cytology pending ? ?Assessment & Plan:  ? ?Acute  hypoxemic  and hypercarbic resp failure  ?Left pneumonia with complicated parapneumonic effusion/suspected empyema ?Cough variant asthma ?--Right pigtail placed 5/13 ?--Pleural lytics started 5/14 ?--Continue chest tube suction -20 cm H20 ?--Monitor chest tube output ?--RN for chest tube flushes q8h ?--F/u pleural cytology and culture ?--Continue antibiotics. De-escalate pending culture data ?--Wean supplemental O2 for goal 88-92% ?--Continue nebulizers. Will change to inhalers once oxygenation improves ? ?Remainder per primary team ? ?Pulmonary will continue to follow ? ?Best Practice (right click and "Reselect all SmartList Selections" daily)  ?  ? ?Labs   ?CBC: ?Recent Labs  ?Lab 02/23/22 ?0352 02/24/22 ?1218 02/25/22 ?0640 02/26/22 ?6962 02/27/22 ?9528  ?WBC 15.1* 19.2* 21.3* 23.4* 14.4*  ?NEUTROABS  --  15.7*  --   --   --   ?HGB 12.8 12.7 12.6 13.6 10.9*  ?HCT 39.3 39.7 40.3 42.7 34.2*  ?MCV 96.6 96.6 98.3 98.4 98.0  ?PLT 265 277 249 309 319  ? ? ?Basic Metabolic Panel: ?Recent Labs  ?Lab 02/22/22 ?0430 02/24/22 ?1218 02/25/22 ?0640 02/26/22 ?4132 02/27/22 ?4401  ?NA 139 135 137 136 135  ?K 3.7 3.7 3.6 3.4* 3.4*  ?CL 97* 91* 96* 95* 99  ?CO2 33* 35* 33* 30 25  ?GLUCOSE 198* 144* 113* 101* 115*  ?BUN 18 11 9 8 10   ?CREATININE 0.46 0.40* 0.38* 0.53 0.49  ?CALCIUM 9.0 8.4* 8.6* 9.0 8.3*  ? ?GFR: ?Estimated Creatinine Clearance: 98.1 mL/min (by C-G formula based on SCr of 0.49 mg/dL). ?Recent Labs  ?Lab 02/24/22 ?1218 02/25/22 ?0640 02/26/22 ?02/28/22 02/27/22 ?03/01/22  ?PROCALCITON <0.10 <0.10 0.11  --   ?WBC 19.2* 21.3* 23.4* 14.4*  ? ? ?Liver Function Tests: ?Recent Labs  ?Lab 02/24/22 ?1218 02/25/22 ?0640 02/26/22 ?02/28/22 02/26/22 ?1056  ?AST 17 15 16   --   ?ALT 26 22 21   --   ?ALKPHOS 48 50 54  --   ?BILITOT 0.7 0.6 0.8  --   ?PROT 6.4* 6.6 7.1 7.0  ?ALBUMIN 2.8* 2.7* 2.5*  --   ? ?No results for input(s): LIPASE, AMYLASE in the last 168 hours. ?No results for input(s): AMMONIA in the last 168 hours. ? ?ABG ?   ?Component Value  Date/Time  ? PHART 7.54 (H) 02/24/2022 1207  ? PCO2ART 52 (H) 02/24/2022 1207  ? PO2ART 64 (L) 02/24/2022 1207  ? HCO3 44.6 (H) 02/24/2022 1207  ? TCO2 22 08/18/2010 2152  ? O2SAT 91.6 02/24/2022 1207  ?  ? ?Coagulation Profile: ?No results for input(s): INR, PROTIME in the last 168 hours. ? ? ?Cardiac Enzymes: ?No results for input(s): CKTOTAL, CKMB, CKMBINDEX, TROPONINI in the last 168 hours. ? ?HbA1C: ?Hgb A1c MFr Bld  ?Date/Time Value Ref Range Status  ?02/16/2022 05:49 PM 7.0 (H) 4.8 - 5.6 % Final  ?  Comment:  ?  (NOTE) ?Pre diabetes:          5.7%-6.4% ? ?Diabetes:              >6.4% ? ?Glycemic control for   <7.0% ?adults with diabetes ?  ? ? ?CBG: ?Recent Labs  ?Lab 02/26/22 ?1601 02/26/22 ?2046 02/27/22 ?0022 02/27/22 ?0407 02/27/22 ?0750  ?GLUCAP 183* 131* 96 129* 105*  ? ? ?Care Time: 50 minutes ? ?Mechele Collin, M.D. ?[facility] ?02/27/2022 8:31 AM  ? ?Please see Amion for pager number to reach on-call Pulmonary and Critical Care Team. ? ?

## 2022-02-27 NOTE — Progress Notes (Signed)
?PROGRESS NOTE ? ?Beverly Tran OFB:[id number] DOB: 09/16/1971 DOA: 02/24/2022 ?PCP: [facility] ? ? LOS: 3 days  ? ?Brief Narrative / Interim history: ?51 year old female with history of COPD and chronic hypoxic respiratory failure on 5 L of oxygen at home, admitted to the hospital with acute on chronic respiratory failure.  Imaging on admission showed a right-sided pleural effusion, multiloculated, concerning for empyema.  She was placed on IV antibiotics and pulmonary were consulted.  Initially she was admitted to [facility], however she was transferred to [facility] for drain placement and potential lytics. ? ?Subjective / 24h Interval events: ?Overall she is feeling better.  Denies any cough or chest congestion.  Still short of breath at times.  No chest pain ? ?Assesement and Plan: ?Principal Problem: ?  Acute on chronic respiratory failure (HCC) ?Active Problems: ?  Essential hypertension ?  Gastroesophageal reflux disease without esophagitis ?  Sepsis ([place]) ?  Hyperglycemia due to diabetes mellitus ([place]) ?  Mixed hyperlipidemia ?  Restless leg syndrome ?  Diabetic neuropathy ([place]) ?  COPD (chronic obstructive pulmonary disease) ([place]) ?  Acute metabolic encephalopathy ?  HCAP (healthcare-associated pneumonia) ?  Anxiety ?  Empyema of right pleural space (HCC) ? ? ?Principal problem ?Sepsis due to probable empyema-patient met sepsis criteria on admission with fever up to 102.1, tachycardia, tachypnea and an elevated white count.  Continue broad-spectrum antibiotics.  Pulmonary consulted, she is status post chest tube placement 5/13.  She is starting pleural fibrinolytics today, further management per pulmonary ? ?Active problems ?Acute on chronic hypoxic respiratory failure due to #1 -respiratory status is stable.  She required BiPAP on admission, but now stable on 6 L nasal cannula ? ?COPD-continue nebulizers ? ?Hypokalemia-replete and continue to monitor.  Check  magnesium ? ?Anxiety-resume home medications ? ?Essential hypertension-blood pressure normal currently, in the setting of sepsis hold home agents for now, resume as appropriate ? ?Hyperlipidemia-continue statin ? ?Iron deficiency anemia-resume home iron ? ?Diabetes mellitus-continue sliding scale, monitor CBGs.  Continue gabapentin for her diabetic neuropathy ? ?CBG (last 3)  ?Recent Labs  ?  02/27/22 ?0022 02/27/22 ?0407 02/27/22 ?0750  ?GLUCAP 96 129* 105*  ? ? ? ?Scheduled Meds: ? arformoterol  15 mcg Nebulization BID  ? budesonide (PULMICORT) nebulizer solution  0.25 mg Nebulization BID  ? Chlorhexidine Gluconate Cloth  6 each Topical Q0600  ? DULoxetine  60 mg Oral Daily  ? ferrous sulfate  325 mg Oral BID  ? gabapentin  600 mg Oral TID  ? heparin  5,000 Units Subcutaneous Q8H  ? insulin aspart  0-15 Units Subcutaneous TID AC & HS  ? ipratropium-albuterol  3 mL Nebulization Q6H  ? mouth rinse  15 mL Mouth Rinse q12n4p  ? mupirocin ointment   Nasal BID  ? pantoprazole  40 mg Oral Daily  ? potassium chloride  40 mEq Oral Once  ? pramipexole  1 mg Oral QHS  ? QUEtiapine  100 mg Oral QHS  ? rosuvastatin  10 mg Oral QHS  ? sodium chloride flush  10 mL Other Q8H  ? sodium chloride flush  10 mL Other Q8H  ? ?Continuous Infusions: ? ceFEPime (MAXIPIME) IV 2 g (02/27/22 0606)  ? vancomycin 1,500 mg (02/26/22 1636)  ? ?PRN Meds:.acetaminophen, acetaminophen, clonazePAM, hydrOXYzine, ondansetron **OR** ondansetron (ZOFRAN) IV, oxyCODONE ? ?Diet Orders (From admission, onward)  ? ?  Start     Ordered  ? 02/26/22 1027  Diet Carb Modified Fluid consistency: Thin;  Room service appropriate? Yes  Diet effective now       ?Question Answer Comment  ?Diet-HS Snack? Nothing   ?Calorie Level Medium 1600-2000   ?Fluid consistency: Thin   ?Room service appropriate? Yes   ?  ? 02/26/22 1026  ? ?  ?  ? ?  ? ? ?DVT prophylaxis: heparin injection 5,000 Units Start: 02/24/22 2200 ? ? ?Lab Results  ?Component Value Date  ? PLT 319  02/27/2022  ? ? ?  Code Status: Full Code ? ?Family Communication: no family at bedside  ? ?Status is: Inpatient ? ?Remains inpatient appropriate because: severity of illness ? ?Level of care: Progressive ? ?Consultants:  ?Pulmonology ? ?Procedures:  ?Chest tube placement 5/13 ? ?Microbiology  ?Blood cultures 5/11 >> no growth to date ? ?Antimicrobials: ?Vancomycin 5/11  ?Cefepime 5/11 ? ? ?Objective: ?Vitals:  ? 02/27/22 0743 02/27/22 0911 02/27/22 0914 02/27/22 0916  ?BP: 120/70     ?Pulse: 94     ?Resp: 18     ?Temp: 98.3 ?F (36.8 ?C)     ?TempSrc: Oral     ?SpO2: 91% 98% 99% 96%  ?Weight:      ?Height:      ? ? ?Intake/Output Summary (Last 24 hours) at 02/27/2022 1049 ?Last data filed at 02/27/2022 0836 ?Gross per 24 hour  ?Intake 1609.83 ml  ?Output 1250 ml  ?Net 359.83 ml  ? ? ?Wt Readings from Last 3 Encounters:  ?02/24/22 99.3 kg  ?02/23/22 103.1 kg  ?04/06/21 99.7 kg  ? ? ?Examination: ? ?Constitutional: NAD ?Eyes: lids and conjunctivae normal, no scleral icterus ?ENMT: mmm ?Neck: normal, supple ?Respiratory: clear to auscultation bilaterally, no wheezing, no crackles.  Overall distant breath sounds ?Cardiovascular: Regular rate and rhythm, no murmurs / rubs / gallops.  Trace LE edema. ?Abdomen: soft, no distention, no tenderness. Bowel sounds positive.  ?Skin: no rashes ?Neurologic: no focal deficits, equal strength ? ? ?Data Reviewed: I have independently reviewed following labs and imaging studies  ? ?CBC ?Recent Labs  ?Lab 02/23/22 ?0352 02/24/22 ?1218 02/25/22 ?0640 02/26/22 ?3818 02/27/22 ?2993  ?WBC 15.1* 19.2* 21.3* 23.4* 14.4*  ?HGB 12.8 12.7 12.6 13.6 10.9*  ?HCT 39.3 39.7 40.3 42.7 34.2*  ?PLT 265 277 249 309 319  ?MCV 96.6 96.6 98.3 98.4 98.0  ?MCH 31.4 30.9 30.7 31.3 31.2  ?MCHC 32.6 32.0 31.3 31.9 31.9  ?RDW 17.0* 16.9* 17.1* 17.0* 16.9*  ?LYMPHSABS  --  2.1  --   --   --   ?MONOABS  --  0.9  --   --   --   ?EOSABS  --  0.1  --   --   --   ?BASOSABS  --  0.0  --   --   --   ? ? ? ?Recent Labs   ?Lab 02/22/22 ?0430 02/24/22 ?1218 02/25/22 ?0640 02/26/22 ?7169 02/27/22 ?6789  ?NA 139 135 137 136 135  ?K 3.7 3.7 3.6 3.4* 3.4*  ?CL 97* 91* 96* 95* 99  ?CO2 33* 35* 33* 30 25  ?GLUCOSE 198* 144* 113* 101* 115*  ?BUN _0 ?CREATININE 0.46 0.40* 0.38* 0.53 0.49  ?CALCIUM 9.0 8.4* 8.6* 9.0 8.3*  ?AST  --  _1 --   ?ALT  --  _2 --   ?ALKPHOS  --  48 50 54  --   ?BILITOT  --  0.7 0.6 0.8  --   ?ALBUMIN  --  2.8*  2.7* 2.5*  --   ?PROCALCITON  --  <0.10 <0.10 0.11  --   ?TSH  --   --  1.057  --   --   ?BNP  --  80.0  --   --   --   ? ? ? ?------------------------------------------------------------------------------------------------------------------ ?No results for input(s): CHOL, HDL, LDLCALC, TRIG, CHOLHDL, LDLDIRECT in the last 72 hours. ? ?Lab Results  ?Component Value Date  ? HGBA1C 7.0 (H) 02/16/2022  ? ?------------------------------------------------------------------------------------------------------------------ ?Recent Labs  ?  02/25/22 ?0640  ?TSH 1.057  ? ? ? ?Cardiac Enzymes ?No results for input(s): CKMB, TROPONINI, MYOGLOBIN in the last 168 hours. ? ?Invalid input(s): CK ?------------------------------------------------------------------------------------------------------------------ ?   ?Component Value Date/Time  ? BNP 80.0 02/24/2022 1218  ? ? ?CBG: ?Recent Labs  ?Lab 02/26/22 ?1601 02/26/22 ?2046 02/27/22 ?0022 02/27/22 ?0407 02/27/22 ?0750  ?GLUCAP 183* 131* 96 129* 105*  ? ? ? ?Recent Results (from the past 240 hour(s))  ?Culture, blood (routine x 2) Call MD if unable to obtain prior to antibiotics being given     Status: None (Preliminary result)  ? Collection Time: 02/24/22  6:07 PM  ? Specimen: BLOOD RIGHT HAND  ?Result Value Ref Range Status  ? Specimen Description BLOOD RIGHT HAND  Final  ? Special Requests   Final  ?  BOTTLES DRAWN AEROBIC AND ANAEROBIC Blood Culture adequate volume  ? Culture   Final  ?  NO GROWTH 3 DAYS ?Performed at [facility], [address], [place], [place] [address] ?  ? Report Status PENDING  Incomplete  ?Culture, blood (routine x 2) Call MD if unable to obtain prior to antibiotics being given     Status: None (Preliminary result)  ? Collection Time: 0

## 2022-02-27 NOTE — Progress Notes (Signed)
Chest tube unclamped and placed back  20 cm of suction per Md order VS were taken q 15 x 4, VSS pt tolerated well ?

## 2022-02-27 NOTE — Progress Notes (Signed)
Chest tube unclamped and connected back to suction. Initial OP . Will continue to monitor. ? ?Brooke Pace, RN ? ?

## 2022-02-28 ENCOUNTER — Inpatient Hospital Stay (HOSPITAL_COMMUNITY): Payer: 59

## 2022-02-28 DIAGNOSIS — B9562 Methicillin resistant Staphylococcus aureus infection as the cause of diseases classified elsewhere: Secondary | ICD-10-CM

## 2022-02-28 DIAGNOSIS — R7881 Bacteremia: Secondary | ICD-10-CM

## 2022-02-28 DIAGNOSIS — J869 Pyothorax without fistula: Secondary | ICD-10-CM

## 2022-02-28 DIAGNOSIS — Z5181 Encounter for therapeutic drug level monitoring: Secondary | ICD-10-CM | POA: Diagnosis not present

## 2022-02-28 DIAGNOSIS — J9620 Acute and chronic respiratory failure, unspecified whether with hypoxia or hypercapnia: Secondary | ICD-10-CM

## 2022-02-28 LAB — CBC
HCT: 37.7 % (ref 36.0–46.0)
Hemoglobin: 11.8 g/dL — ABNORMAL LOW (ref 12.0–15.0)
MCH: 30.5 pg (ref 26.0–34.0)
MCHC: 31.3 g/dL (ref 30.0–36.0)
MCV: 97.4 fL (ref 80.0–100.0)
Platelets: 362 10*3/uL (ref 150–400)
RBC: 3.87 MIL/uL (ref 3.87–5.11)
RDW: 16.7 % — ABNORMAL HIGH (ref 11.5–15.5)
WBC: 11.3 10*3/uL — ABNORMAL HIGH (ref 4.0–10.5)
nRBC: 0 % (ref 0.0–0.2)

## 2022-02-28 LAB — VANCOMYCIN, PEAK: Vancomycin Pk: 23 ug/mL — ABNORMAL LOW (ref 30–40)

## 2022-02-28 LAB — GLUCOSE, CAPILLARY
Glucose-Capillary: 131 mg/dL — ABNORMAL HIGH (ref 70–99)
Glucose-Capillary: 162 mg/dL — ABNORMAL HIGH (ref 70–99)
Glucose-Capillary: 166 mg/dL — ABNORMAL HIGH (ref 70–99)
Glucose-Capillary: 148 mg/dL — ABNORMAL HIGH (ref 70–99)

## 2022-02-28 LAB — BASIC METABOLIC PANEL
Anion gap: 7 (ref 5–15)
BUN: 7 mg/dL (ref 6–20)
CO2: 26 mmol/L (ref 22–32)
Calcium: 8.6 mg/dL — ABNORMAL LOW (ref 8.9–10.3)
Chloride: 102 mmol/L (ref 98–111)
Creatinine, Ser: 0.39 mg/dL — ABNORMAL LOW (ref 0.44–1.00)
GFR, Estimated: >60 mL/min (ref >60)
Glucose, Bld: 131 mg/dL — ABNORMAL HIGH (ref 70–99)
Potassium: 3.7 mmol/L (ref 3.5–5.1)
Sodium: 135 mmol/L (ref 135–145)

## 2022-02-28 LAB — LEGIONELLA PNEUMOPHILA SEROGP 1 UR AG: L. pneumophila Serogp 1 Ur Ag: NEGATIVE

## 2022-02-28 LAB — MAGNESIUM: Magnesium: 1.8 mg/dL (ref 1.7–2.4)

## 2022-02-28 LAB — CYTOLOGY - NON PAP

## 2022-02-28 IMAGING — CT CT CHEST W/O CM
2 of 4 series · 14 of 36 positions shown, 17 images · non-contrast
Comparison: CTA chest [DATE] at [HOSPITAL]

CLINICAL DATA: Empyema.



[Series 3: chest wo · axial · 0.72mm/px · z∈[+1200,+1416]mm · 11 of 128 slices shown, 14 images]
[im 10/128  mediastinal]
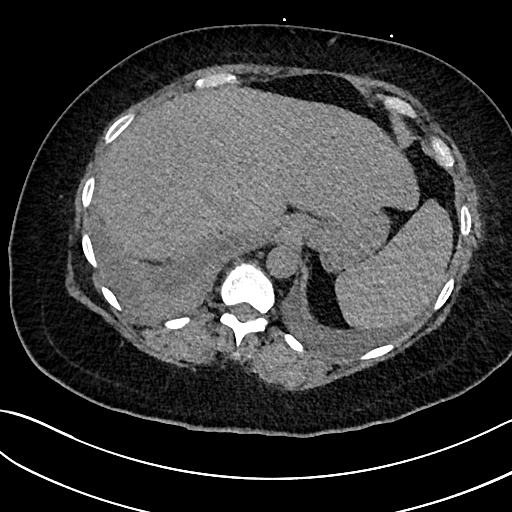
[im 10/128  lung]
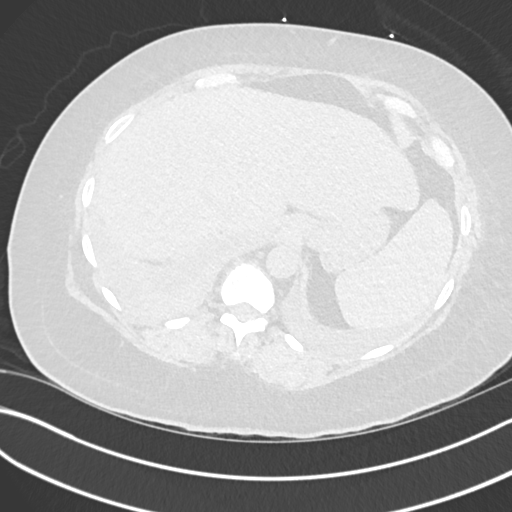
[im 20/128  lung]
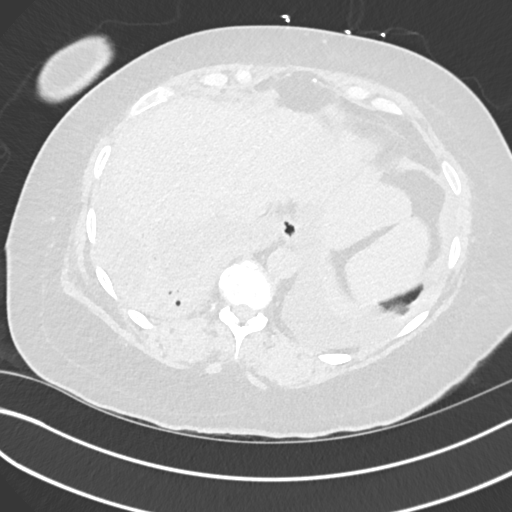
[im 30/128  lung]
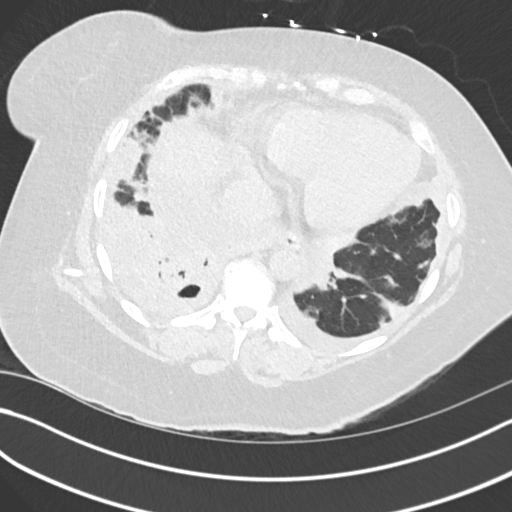
[im 40/128  lung]
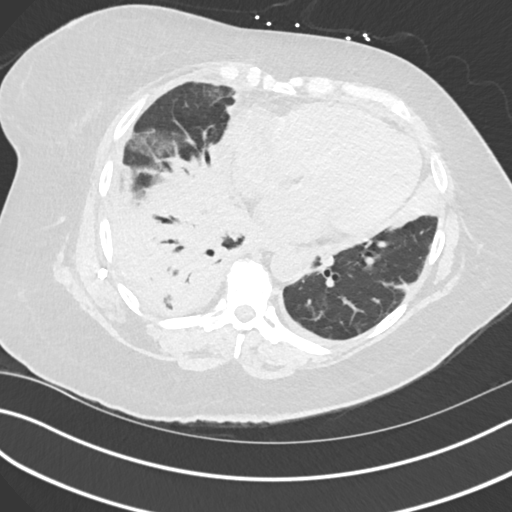
[im 49/128  mediastinal]
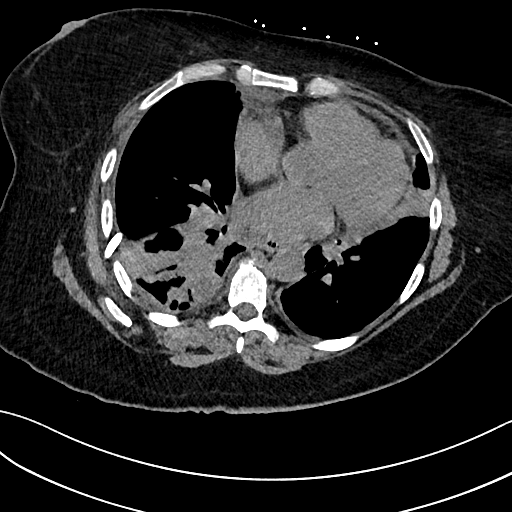
[im 49/128  lung]
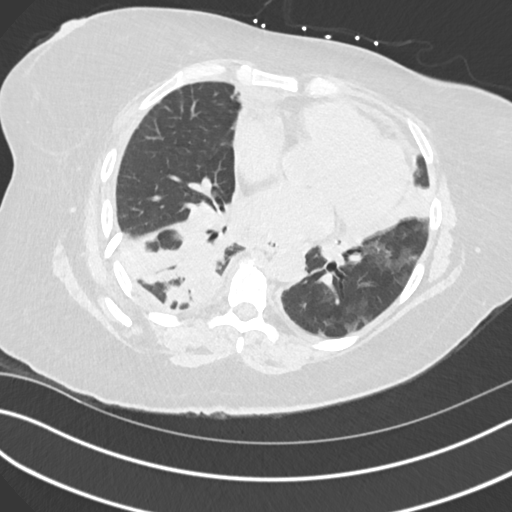
[im 69/128  lung]
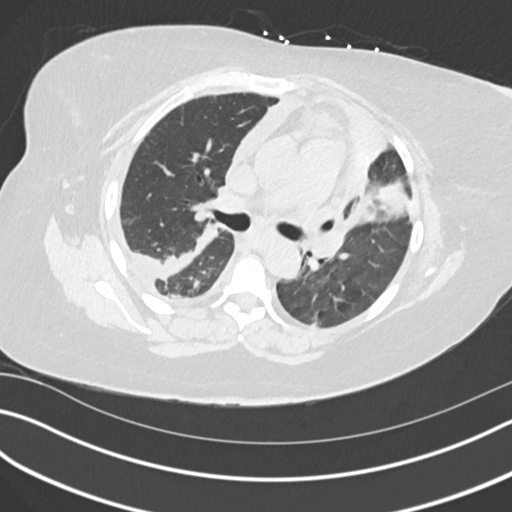
[im 79/128  lung]
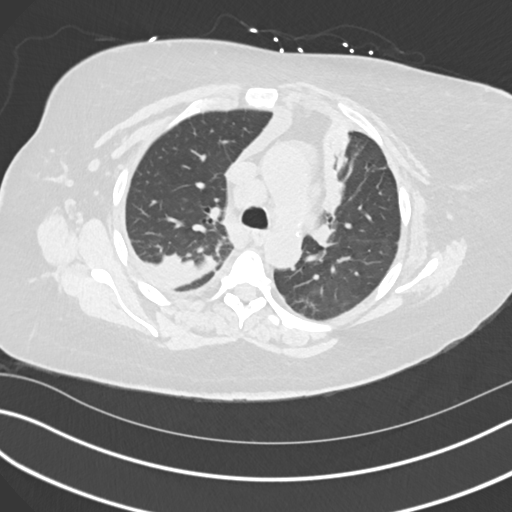
[im 88/128  lung]
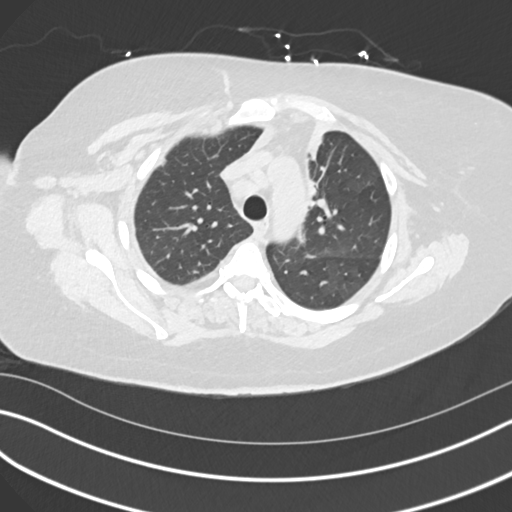
[im 98/128  mediastinal]
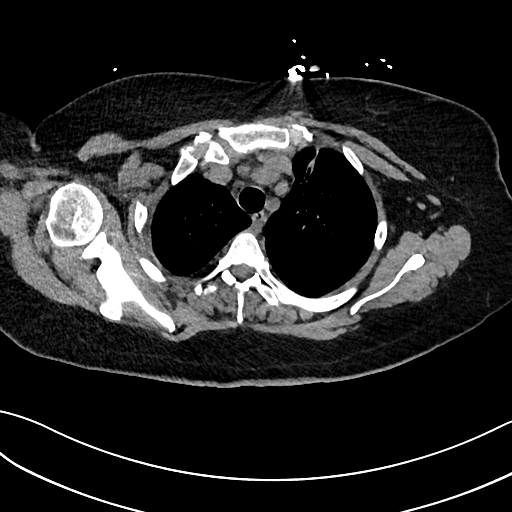
[im 98/128  lung]
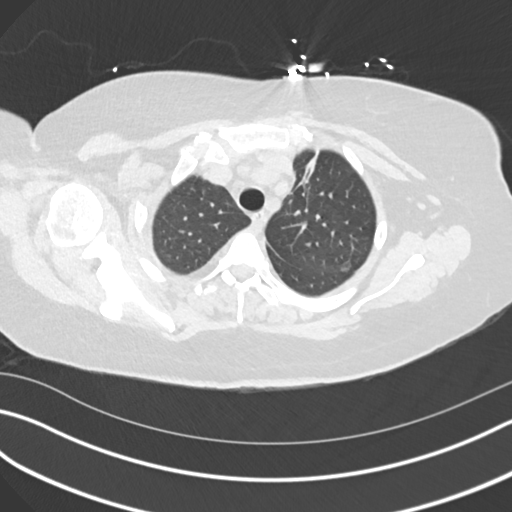
[im 108/128  lung]
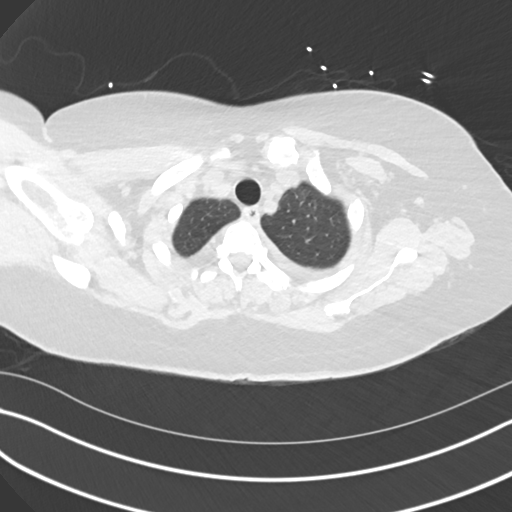
[im 118/128  lung]
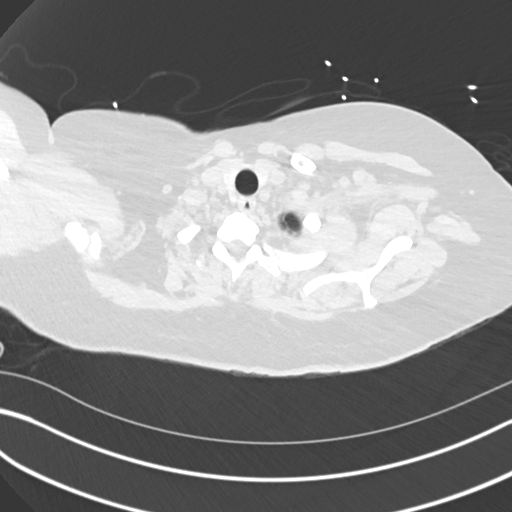

[Series 7: cor · coronal · 0.56mm/px · 3 of 154 slices shown]
[im 31/154  lung]
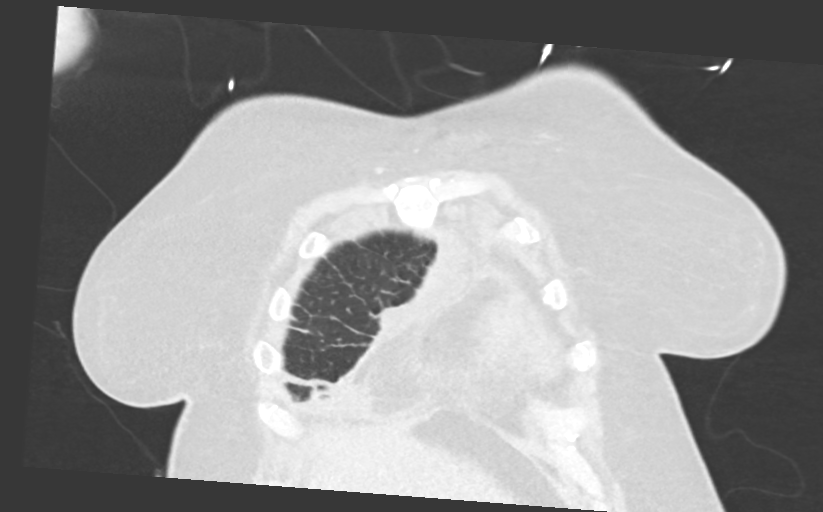
[im 62/154  lung]
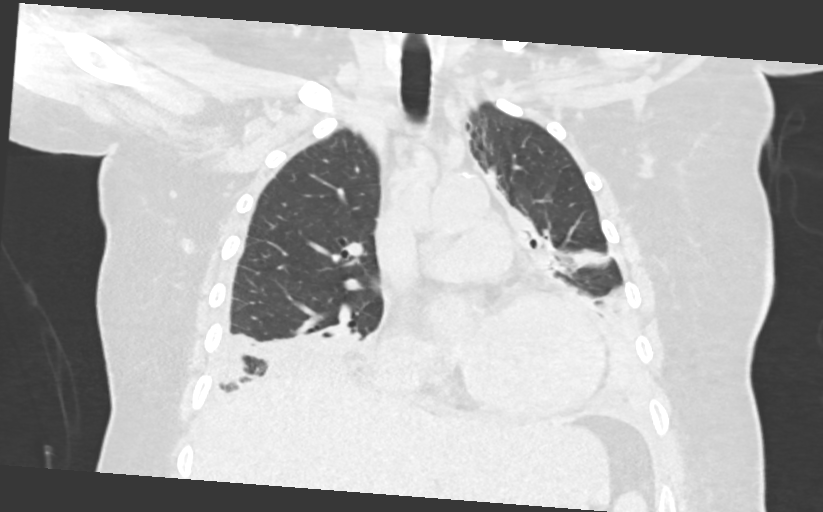
[im 92/154  lung]
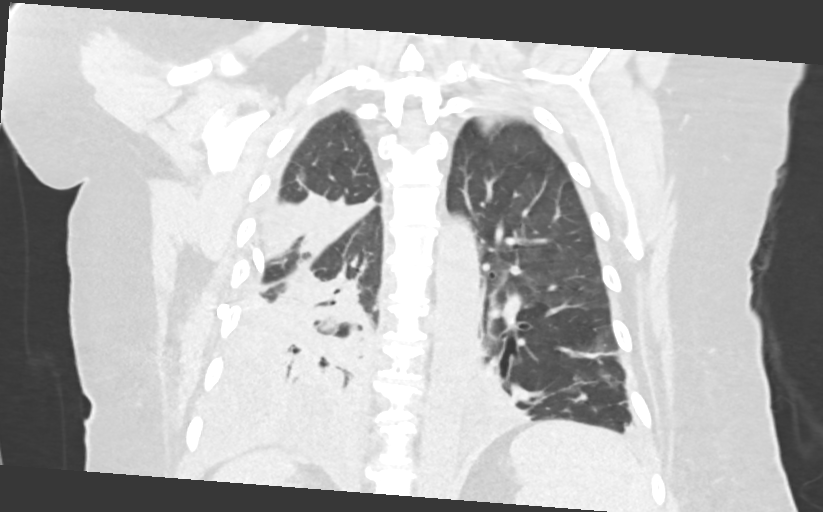

[14 of 36 positions shown; findings below may reference images not displayed]

FINDINGS: Cardiovascular: Heart is mildly enlarged. Small pericardial effusion
is noted.

Mediastinum/Nodes: No enlarged mediastinal or axillary lymph nodes.
Thyroid gland, trachea, and esophagus demonstrate no significant
findings.

Lungs/Pleura: A pigtail catheter drain is present in the right
pleural space. The lateral and superior components the empyema have
been drained. The inferior and medial component remains. There is
some fluid inferior and lateral as well.

Airspace consolidation in the right lower lobe has progressed with
partial collapse of the right lower lobe.

A left pleural effusion is similar the prior study with mild
dependent atelectasis. Minimal airspace disease in the lingula is
stable. The upper lung fields are otherwise clear.

Upper Abdomen: Unremarkable

Musculoskeletal: No chest wall mass or suspicious bone lesions
identified.
IMPRESSION: 1. Interval drainage of superior and lateral components of a
right-sided empyema.
2. Persistent inferolateral and inferomedial components of the
empyema suggest loculation
3. Progressive airspace consolidation in the right lower lobe with
partial collapse of the right lower lobe.
4. Stable left pleural effusion and mild dependent atelectasis.

## 2022-02-28 MED ORDER — SODIUM CHLORIDE 0.9 % IV SOLN: INTRAVENOUS | Status: DC

## 2022-02-28 MED ORDER — SODIUM CHLORIDE 0.9 % IV SOLN
3.0000 g | Freq: Four times a day (QID) | INTRAVENOUS | Status: DC
  Administered 2022-02-28 – 2022-03-02 (×8): 3 g via INTRAVENOUS
  Filled 2022-02-28 (×9): qty 8

## 2022-02-28 MED ORDER — VANCOMYCIN HCL 1500 MG/300ML IV SOLN
1500.0000 mg | INTRAVENOUS | Status: DC
  Administered 2022-02-28: 1500 mg via INTRAVENOUS
  Filled 2022-02-28 (×2): qty 300

## 2022-02-28 MED ORDER — VANCOMYCIN HCL 750 MG/150ML IV SOLN: 750.0000 mg | Freq: Two times a day (BID) | INTRAVENOUS | Status: DC

## 2022-02-28 NOTE — Progress Notes (Signed)
Reviewed scan, lateral and apical portions of fluid removed.  The subpulmonic portion is admixed with necrotic lung and difficult to place even with CT guidance.  Discussed case with partner Dr. Chestine Spore and we agree best step is prolonged abx and f/u OP imaging.  Hope ID can give [place] some insight into best regimen as I would hope for at least 4 weeks therapy for incompletely drained right pleural space. ? ?Pigtail removed at bedside. ? ?Myrla Halsted MD PCCM ?

## 2022-02-28 NOTE — Progress Notes (Signed)
? ?  NAME:  Beverly Tran, MRN:  [id number], DOB:  1971/03/01, LOS: 4 ?ADMISSION DATE:  02/24/2022, CONSULTATION DATE:  02/21/22 ?REFERRING MD:  Emokpae/Triad, CHIEF COMPLAINT:  resp distress   ? ?History of Present Illness:  ?51  yowf quit smoking 01/2020 last seen in pulmonary clinic 03/18/21 with dx cough variant asthma vs VCD flared on ACEi and better off acei and on max gerd rx s hypercarbia but using 02 prn and with rec for pfts which were  never done >>>   admitted on 02/16/2022 ? on ACEi again with dx sepsis secondary to multifocal pneumonia resulting in acute on chronic hypoxic respiratory failure requiring prolonged BiPAP use so PCCM consulted 5/8 rec wean bipap/ 02/ rx empirically for CAP but pt left ama once awake enough to refuse bipap ? ?5/11 returned in am to ER with increase sob at rest and not saturating  above 80% on her home flow reported to be 6lpm (not verified, nor clear what meds she's really taking) - c/o midline mid back pain s pleuritic component, some gen ha but no N or V or Chills or abd pain or dysuria.  W/u showed new mod large R effusion so rec to tap in ER ASAP > c/w empyema confirmed 5/11 so rec trx to [facility] for IR drain and likely will need lytics  ? ? ? ?Pertinent  Medical History  ?DM type 2 with diabetic neuropathy ?HBP ?GERD ?RLS  ? ? ?Significant Hospital Events: ?Including procedures, antibiotic start and stop dates in addition to other pertinent events   ?CTa  5/3 Severe bilateral multifocal pneumonia, most confluent in the right lower lobe.  ?Urine strep/ legionella 5/3 neg  ?MRSA PCR  5/4  POS ?Echo 5/4 ok x for RA est pressure 15  with Nl LA size but RA not well viz ?R Tcentesis 5/11 >>> too organized to drain ?Maxepime/Vanc  5/11 >>> ?CTa 5/12 c/w empyema on R > IR ([facility]) rec CT directed drain/likely to need lytics ? ?Interim History / Subjective:  ?No events. ?States breathing continues to [employer]. ?Has not used BIPAP in 2 nights. ? ?Objective   ?Blood pressure 116/70, pulse  (!) 101, temperature 99.9 ?F (37.7 ?C), temperature source Oral, resp. rate 20, height 5\' 5"  (1.651 m), weight 99.3 kg, SpO2 94 %. ?   ?   ? ?Intake/Output Summary (Last 24 hours) at 02/28/2022 0942 ?Last data filed at 02/28/2022 [phone] ?Gross per 24 hour  ?Intake 860 ml  ?Output 2500 ml  ?Net -1640 ml  ? ? ? ?Filed Weights  ? 02/24/22 1146 02/24/22 1445  ?Weight: 103.1 kg 99.3 kg  ? ?Physical Exam: ?No distress ?Atrium with amber fluid, no tidaling or air leak ?Lung sounds diminished at bases ? ?Minimal chest tube output overnight after lytics ?CXR stable R shadows ? ?Assessment & Plan:  ? ?Acute  hypoxemic and hypercarbic resp failure  ?Left pneumonia with complicated parapneumonic effusion/suspected empyema and imaging signs of lung necrosis ?Cough variant asthma ? ?Cultures neg, MRSA PCR +, Pct neg.  Not really sure of a good abx plan at present, will d/w primary. ? ?CT chest given drop in output and persistent abnormalties on CXR ? ?Would favor longer course of abx given necrosis on CT ? ?04/26/22 MD PCCM ?

## 2022-02-28 NOTE — Consult Note (Addendum)
?   ?I have seen and examined the patient. I have personally reviewed the clinical findings, laboratory findings, microbiological data and imaging studies. The assessment and treatment plan was discussed with the Nurse Practitioner Rexene Alberts. I agree with her/his recommendations except following additions/corrections. ? ?51 year old female with history of DM 2 with neuropathy, HTN, GERD, RLS, anxiety COPD on home oxygen who presented to the ED on 5/11 with shortness of breath after leaving AMA day before from a [facility] with pneumonia.  On arrival SPO2 77% on home oxygen on 6 L and was placed on nonrebreather with oxygen sat increasing to 87%, later switched to BiPAP.  Recently admitted and [facility] 5/3-5/10 after being admitted for COPD with exacerbation and multifocal pneumonia. She was also admitted at OSH  5/2-5/3 for respiratory failure/PNA/sepsis. However, patient left AMA on 5/3 without completion of full work up. Blood cx at 5/2 OSH MRSA in both sets  ? ?At ED, febrile 101.3, WBC 19.2, CR 0.4 ?5/3 blood cultures no growth in 3 days ?5/11 blood cultures no growth in 4 days ?5/4 MRSA PCR positive ?TTE 5/4 with no vegetations or endocarditis  ? ?S/p Rt chest tube 5/13 ( CX no growth). S/p fibrinolytics  ? ?Imaging/Labs/Microbiologic data/EMR notes  reviewed ? ?Continue Vancomycin, will switch cefepime to Unasyn for the time being  ?Fu blood cultures  ?TEE planned for 5/18 ?Final recs to be made pending TEE results ?Monitor CBC, BMP and Vancomycin trough  ?Fu Pulm recs for empyema, ? CT surgery evaluation ?D/w Dr Elvera Lennox and Dr Katrinka Blazing ?Following  ? ?Odette Fraction, MD ?Infectious Disease Physician ?[facility] ?[address] ?[place], [place] [address] ?Phone: [phone]  Fax: [phone] ? ? ? ?[facility]   ? ?Date of Admission:  02/24/2022    ? ?Total days of antibiotics 5 ? Vancomycin 5/11 >> Current  ? Cefepime 5/11 >>  Current  ?        ?      ?Reason for Consult: Empyema     ?Referring Provider: Katrinka Blazing ?Primary Care Provider: [facility]  ? ?Assessment: ?Beverly Tran is a 51 y.o. female with history of COPD on chronic 4LPM O2, T2DM, HTN, HLD. She has been struggling with pneumonia and worsening hypoxic respiratory failure since late April. She has sought care a few times in the hospital but left against medical advice 2 times resulting in under-treatment of current problem. Now admitted with evolution of empyema requiring drainage. No ID on cultures that were obtained from thoracentesis. She seems to be improving clinically and WBC down trending + resolution of fevers with adequate drainage. With MRSA nasal PCR positive and necrotizing lung features would ensure we continue coverage with vancomycin. Will narrow to unasyn to cover typical oro dental flora and anaerobes.  ?Once her tubes come out she can discharge home with 4 weeks of Doxycycline + Augmentin with reassessment outpatient to ensure she has been adequately treated. Will probably need another CT at end of therapy to determine if anbitiotics need extension or not with suggested loculated area on today's CT scan ? ? ? ?Plan: ?Recommend Vancomycin + Unasyn for ongoing IV treatment  ? ? ? ?Principal Problem: ?  Acute on chronic respiratory failure (HCC) ?Active Problems: ?  Essential hypertension ?  Gastroesophageal reflux disease without esophagitis ?  Sepsis (HCC) ?  Hyperglycemia due to diabetes mellitus (HCC) ?  Mixed hyperlipidemia ?  Restless  leg syndrome ?  Diabetic neuropathy (HCC) ?  COPD (chronic obstructive pulmonary disease) (HCC) ?  Acute metabolic encephalopathy ?  HCAP (healthcare-associated pneumonia) ?  Anxiety ?  Empyema of right pleural space (HCC) ?  Pleural effusion ? ? ? arformoterol  15 mcg Nebulization BID  ? budesonide (PULMICORT) nebulizer solution  0.25 mg Nebulization BID  ? Chlorhexidine Gluconate Cloth  6 each  Topical Q0600  ? DULoxetine  60 mg Oral Daily  ? ferrous sulfate  325 mg Oral BID  ? gabapentin  600 mg Oral TID  ? heparin  5,000 Units Subcutaneous Q8H  ? insulin aspart  0-15 Units Subcutaneous TID AC & HS  ? ipratropium-albuterol  3 mL Nebulization Q6H  ? mouth rinse  15 mL Mouth Rinse q12n4p  ? mupirocin ointment   Nasal BID  ? pantoprazole  40 mg Oral Daily  ? pramipexole  1 mg Oral QHS  ? QUEtiapine  100 mg Oral QHS  ? rosuvastatin  10 mg Oral QHS  ? sodium chloride flush  10 mL Other Q8H  ? sodium chloride flush  10 mL Other Q8H  ? ? ?HPI: Beverly Tran is a 51 y.o. female  ? ?Follows with pulmonology outpatient for cough variant asthma and hypoxic respiratory failure requiring regular oxygen use up to 4 LPM.  ?Ms. Beverly Tran has been  ? ?Has had a few contacts with hospitals recently for care - [facility] and left AMA in early April for PNA - was treated with IV ceftriaxone and PO azithromax; required bipap and vasopressors for septic picture (hypotensive, lactic acidosis). COVID, Flu and RSV were negative on PCR testing.  ? ?C[facility] the next day on 5/03 after she was still dyspneic/hypoxic on 5 LPM. Chest imaging revealed severe b/l multifocal pneumonia most confluent in the right lower lobe.  ?She unfortunately left our hospital again prior to medical stability and left AMA and with a plan to follow up with Dr. Sherene SiresWert soon.  ? ?Unfortunately she has continued to do poorly at home and required another EMS trip to ER with BiPAP for hypoxic respiratory failure. CXR with increased pulm vascular congestion, increased lung density in b/l lower fields. U/S with small right chest effusion. CTA with worsening multifocal pna and new ground glass opacities and new concern for empyema. Underwent thoracentesis 5/13 with CT placement  ? ?MRSA PCR positive. She feels better from breathing perspective. Back nearly to her baseline but having some discomfort form her tube. She has a  good appetite, no fevers and tolerating antibiotics without any side effects. She hopes that enough of the abscess has decreased with drainage that we can remove the tube soon.  ? ?No etoh, drugs or smoking (quit 578yrs back). No travel.  ? ? ?Review of Systems: ?Review of Systems  ?Constitutional:  Negative for chills and fever.  ?Respiratory:  Negative for sputum production. Shortness of breath: improved.  ?Cardiovascular:  Positive for chest pain (wiht tubes in place).  ?Skin:  Negative for rash.  ? ?Past Medical History:  ?Diagnosis Date  ? Anxiety   ? Arthritis   ? Asthma   ? Back pain, chronic   ? Depression   ? GERD (gastroesophageal reflux disease)   ? High cholesterol   ? Hypertension   ? IBS (irritable bowel syndrome)   ? Migraine   ? ?Past Surgical History:  ?Procedure Laterality Date  ? APPENDECTOMY    ? CESAREAN SECTION    ?  CHOLECYSTECTOMY    ? COLONOSCOPY  2003  ? poor prep, grossly normal rectum, colon, and TI. Path not available.   ? ESOPHAGOGASTRODUODENOSCOPY  2003  ? Dr. Jena Gauss:  normal esophagus, stomach, duodenum s/p small bowel biopsy.   ? HERNIA REPAIR    ? TUBAL LIGATION    ? ? ? ?Social History  ? ?Tobacco Use  ? Smoking status: Former  ?  Packs/day: 1.00  ?  Years: 7.00  ?  Pack years: 7.00  ?  Types: Cigarettes  ?  Quit date: 02/03/2020  ?  Years since quitting: 2.0  ? Smokeless tobacco: Never  ?Vaping Use  ? Vaping Use: Never used  ?Substance Use Topics  ? Alcohol use: No  ? Drug use: No  ? ? ?Family History  ?Problem Relation Age of Onset  ? Seizures Mother   ? Heart failure Mother   ? Heart failure Father   ? Colon cancer Neg Hx   ? Colon polyps Neg Hx   ? ?Allergies  ?Allergen Reactions  ? Diclofenac Anaphylaxis  ? Neoma Laming Meat] Shortness Of Breath and Swelling  ?  Also Allergic to POPCORN: same reaction  ? Aspirin Other (See Comments)  ?  Nose bleeds  ? Bee Venom Swelling  ?  WASP/HORNET  ? Compazine [Prochlorperazine] Hives  ? Imitrex [Sumatriptan] Hives  ? Lamotrigine Other (See  Comments)  ?  confusion  ? Nubain [Nalbuphine Hcl] Swelling and Other (See Comments)  ?  Eye swelling  ? Thorazine [Chlorpromazine] Hives  ? Topamax [Topiramate] Other (See Comments)  ?  Confusion and out o

## 2022-02-28 NOTE — Progress Notes (Signed)
?PROGRESS NOTE ? ?Beverly Tran HKG:[id number] DOB: 30-Jul-1971 DOA: 02/24/2022 ?PCP: [facility] ? ? LOS: 4 days  ? ?Brief Narrative / Interim history: ?51 year old female with history of COPD and chronic hypoxic respiratory failure on 5 L of oxygen at home, admitted to the hospital with acute on chronic respiratory failure.  Imaging on admission showed a right-sided pleural effusion, multiloculated, concerning for empyema.  She was placed on IV antibiotics and pulmonary were consulted.  Initially she was admitted to [facility], however she was transferred to [facility] for drain placement and potential lytics. ? ?Subjective / 24h Interval events: ?Feeling better.  Breathing almost back to baseline.  No chest pain, no abdominal pain, no nausea or vomiting ? ?Assesement and Plan: ?Principal Problem: ?  Acute on chronic respiratory failure (HCC) ?Active Problems: ?  Essential hypertension ?  Gastroesophageal reflux disease without esophagitis ?  Sepsis ([place]) ?  Hyperglycemia due to diabetes mellitus ([place]) ?  Mixed hyperlipidemia ?  Restless leg syndrome ?  Diabetic neuropathy ([place]) ?  COPD (chronic obstructive pulmonary disease) ([place]) ?  Acute metabolic encephalopathy ?  HCAP (healthcare-associated pneumonia) ?  Anxiety ?  Empyema of right pleural space (HCC) ?  Pleural effusion ? ? ?Principal problem ?Sepsis due to probable empyema-patient met sepsis criteria on admission with fever up to 102.1, tachycardia, tachypnea and an elevated white count.  Continue broad-spectrum antibiotics.  Pulmonary consulted, she is status post chest tube placement 5/13 and currently getting fibrinolytics.  ID consulted today as well.  Cultures still no growth to date ? ?Active problems ?Acute on chronic hypoxic respiratory failure due to #1 -respiratory status is stable.  She required BiPAP on admission, but now stable on 5 L nasal cannula which is close to her baseline. ? ?COPD-continue  nebulizers, no wheezing, respiratory status stable ? ?Hypokalemia-continue to replete, monitor ? ?Anxiety-resume home medications ? ?Essential hypertension-blood pressure remains normal this morning, in the setting of sepsis hold home agents for now, resume as appropriate ? ?Hyperlipidemia-continue statin ? ?Iron deficiency anemia-resume home iron ? ?Diabetes mellitus-continue sliding scale, monitor CBGs.  Continue gabapentin for her diabetic neuropathy.  CBGs with good control ? ?CBG (last 3)  ?Recent Labs  ?  02/27/22 ?2107 02/28/22 ?0608 02/28/22 ?1105  ?GLUCAP 101* 131* 162*  ? ? ? ?Scheduled Meds: ? arformoterol  15 mcg Nebulization BID  ? budesonide (PULMICORT) nebulizer solution  0.25 mg Nebulization BID  ? Chlorhexidine Gluconate Cloth  6 each Topical Q0600  ? DULoxetine  60 mg Oral Daily  ? ferrous sulfate  325 mg Oral BID  ? gabapentin  600 mg Oral TID  ? heparin  5,000 Units Subcutaneous Q8H  ? insulin aspart  0-15 Units Subcutaneous TID AC & HS  ? ipratropium-albuterol  3 mL Nebulization Q6H  ? mouth rinse  15 mL Mouth Rinse q12n4p  ? mupirocin ointment   Nasal BID  ? pantoprazole  40 mg Oral Daily  ? pramipexole  1 mg Oral QHS  ? QUEtiapine  100 mg Oral QHS  ? rosuvastatin  10 mg Oral QHS  ? sodium chloride flush  10 mL Other Q8H  ? sodium chloride flush  10 mL Other Q8H  ? ?Continuous Infusions: ? ampicillin-sulbactam (UNASYN) IV    ? vancomycin    ? ?PRN Meds:.acetaminophen, acetaminophen, clonazePAM, hydrOXYzine, ondansetron **OR** ondansetron (ZOFRAN) IV, oxyCODONE ? ?Diet Orders (From admission, onward)  ? ?  Start     Ordered  ? 02/26/22 1027  Diet Carb Modified Fluid consistency: Thin; Room service appropriate? Yes  Diet effective now       ?Question Answer Comment  ?Diet-HS Snack? Nothing   ?Calorie Level Medium 1600-2000   ?Fluid consistency: Thin   ?Room service appropriate? Yes   ?  ? 02/26/22 1026  ? ?  ?  ? ?  ? ? ?DVT prophylaxis: heparin injection 5,000 Units Start: 02/24/22 2200 ? ? ?Lab  Results  ?Component Value Date  ? PLT 362 02/28/2022  ? ? ?  Code Status: Full Code ? ?Family Communication: no family at bedside  ? ?Status is: Inpatient ? ?Remains inpatient appropriate because: severity of illness ? ?Level of care: Progressive ? ?Consultants:  ?Pulmonology ? ?Procedures:  ?Chest tube placement 5/13 ? ?Microbiology  ?Blood cultures 5/11 >> no growth to date ? ?Antimicrobials: ?Vancomycin 5/11  ?Cefepime 5/11 ? ? ?Objective: ?Vitals:  ? 02/28/22 4315 02/28/22 0324 02/28/22 0813 02/28/22 4008  ?BP:  (!) 119/57 116/70   ?Pulse:  94 (!) 101   ?Resp:  (!) 24 20   ?Temp:  98.8 ?F (37.1 ?C) 99.9 ?F (37.7 ?C)   ?TempSrc:  Oral Oral   ?SpO2: 92% 91% 96% 94%  ?Weight:      ?Height:      ? ? ?Intake/Output Summary (Last 24 hours) at 02/28/2022 1203 ?Last data filed at 02/28/2022 1025 ?Gross per 24 hour  ?Intake 620 ml  ?Output 2900 ml  ?Net -2280 ml  ? ? ?Wt Readings from Last 3 Encounters:  ?02/24/22 99.3 kg  ?02/23/22 103.1 kg  ?04/06/21 99.7 kg  ? ? ?Examination: ? ?Constitutional: NAD ?Eyes: lids and conjunctivae normal, no scleral icterus ?ENMT: mmm ?Neck: normal, supple ?Respiratory: clear to auscultation bilaterally, no wheezing, no crackles.  Overall distant breath sounds ?Cardiovascular: Regular rate and rhythm, no murmurs / rubs / gallops.  Trace LE edema. ?Abdomen: soft, no distention, no tenderness. Bowel sounds positive.  ?Skin: no rashes ?Neurologic: no focal deficits, equal strength ? ? ?Data Reviewed: I have independently reviewed following labs and imaging studies  ? ?CBC ?Recent Labs  ?Lab 02/24/22 ?1218 02/25/22 ?0640 02/26/22 ?6761 02/27/22 ?9509 02/28/22 ?3267  ?WBC 19.2* 21.3* 23.4* 14.4* 11.3*  ?HGB 12.7 12.6 13.6 10.9* 11.8*  ?HCT 39.7 40.3 42.7 34.2* 37.7  ?PLT 277 249 309 319 362  ?MCV 96.6 98.3 98.4 98.0 97.4  ?MCH 30.9 30.7 31.3 31.2 30.5  ?MCHC 32.0 31.3 31.9 31.9 31.3  ?RDW 16.9* 17.1* 17.0* 16.9* 16.7*  ?LYMPHSABS 2.1  --   --   --   --   ?MONOABS 0.9  --   --   --   --   ?EOSABS  0.1  --   --   --   --   ?BASOSABS 0.0  --   --   --   --   ? ? ? ?Recent Labs  ?Lab 02/24/22 ?1218 02/25/22 ?0640 02/26/22 ?1245 02/27/22 ?8099 02/28/22 ?8338  ?NA 135 137 136 135 135  ?K 3.7 3.6 3.4* 3.4* 3.7  ?CL 91* 96* 95* 99 102  ?CO2 35* 33* $Remov'30 25 26  'UtaJJi$ ?GLUCOSE 144* 113* 101* 115* 131*  ?BUN $Rem'11 9 8 10 7  'xSiB$ ?CREATININE 0.40* 0.38* 0.53 0.49 0.39*  ?CALCIUM 8.4* 8.6* 9.0 8.3* 8.6*  ?AST $Rem'17 15 16  'iZQW$ --   --   ?ALT $Rem'26 22 21  'hvBa$ --   --   ?ALKPHOS 48 50 54  --   --   ?BILITOT 0.7 0.6 0.8  --   --   ?  ALBUMIN 2.8* 2.7* 2.5*  --   --   ?MG  --   --   --   --  1.8  ?PROCALCITON <0.10 <0.10 0.11  --   --   ?TSH  --  1.057  --   --   --   ?BNP 80.0  --   --   --   --   ? ? ? ?------------------------------------------------------------------------------------------------------------------ ?No results for input(s): CHOL, HDL, LDLCALC, TRIG, CHOLHDL, LDLDIRECT in the last 72 hours. ? ?Lab Results  ?Component Value Date  ? HGBA1C 7.0 (H) 02/16/2022  ? ?------------------------------------------------------------------------------------------------------------------ ?No results for input(s): TSH, T4TOTAL, T3FREE, THYROIDAB in the last 72 hours. ? ?Invalid input(s): FREET3 ? ? ?Cardiac Enzymes ?No results for input(s): CKMB, TROPONINI, MYOGLOBIN in the last 168 hours. ? ?Invalid input(s): CK ?------------------------------------------------------------------------------------------------------------------ ?   ?Component Value Date/Time  ? BNP 80.0 02/24/2022 1218  ? ? ?CBG: ?Recent Labs  ?Lab 02/27/22 ?1136 02/27/22 ?1644 02/27/22 ?2107 02/28/22 ?5945 02/28/22 ?1105  ?GLUCAP 180* 229* 101* 131* 162*  ? ? ? ?Recent Results (from the past 240 hour(s))  ?Culture, blood (routine x 2) Call MD if unable to obtain prior to antibiotics being given     Status: None (Preliminary result)  ? Collection Time: 02/24/22  6:07 PM  ? Specimen: BLOOD RIGHT HAND  ?Result Value Ref Range Status  ? Specimen Description BLOOD RIGHT HAND  Final  ? Special  Requests   Final  ?  BOTTLES DRAWN AEROBIC AND ANAEROBIC Blood Culture adequate volume  ? Culture   Final  ?  NO GROWTH 4 DAYS ?Performed at [facility], [address], [place], [place] [address] ?  ? Repo

## 2022-03-01 ENCOUNTER — Telehealth: Payer: Self-pay | Admitting: Internal Medicine

## 2022-03-01 ENCOUNTER — Inpatient Hospital Stay (HOSPITAL_COMMUNITY): Payer: 59

## 2022-03-01 DIAGNOSIS — J9622 Acute and chronic respiratory failure with hypercapnia: Secondary | ICD-10-CM | POA: Diagnosis not present

## 2022-03-01 DIAGNOSIS — J9621 Acute and chronic respiratory failure with hypoxia: Secondary | ICD-10-CM | POA: Diagnosis not present

## 2022-03-01 LAB — GLUCOSE, CAPILLARY
Glucose-Capillary: 192 mg/dL — ABNORMAL HIGH (ref 70–99)
Glucose-Capillary: 227 mg/dL — ABNORMAL HIGH (ref 70–99)
Glucose-Capillary: 138 mg/dL — ABNORMAL HIGH (ref 70–99)
Glucose-Capillary: 214 mg/dL — ABNORMAL HIGH (ref 70–99)

## 2022-03-01 LAB — CULTURE, BLOOD (ROUTINE X 2)
Culture: NO GROWTH
Culture: NO GROWTH
Special Requests: ADEQUATE
Special Requests: ADEQUATE

## 2022-03-01 LAB — BODY FLUID CULTURE W GRAM STAIN: Culture: NO GROWTH

## 2022-03-01 LAB — VANCOMYCIN, TROUGH: Vancomycin Tr: <4 ug/mL — ABNORMAL LOW (ref 15–20)

## 2022-03-01 IMAGING — DX DG CHEST 1V PORT
1 series · 1 of 1 positions shown · non-contrast
Comparison: [DATE]

CLINICAL DATA: SOB (shortness of breath) cough

EXAM:
PORTABLE CHEST - 1 VIEW

[chest]
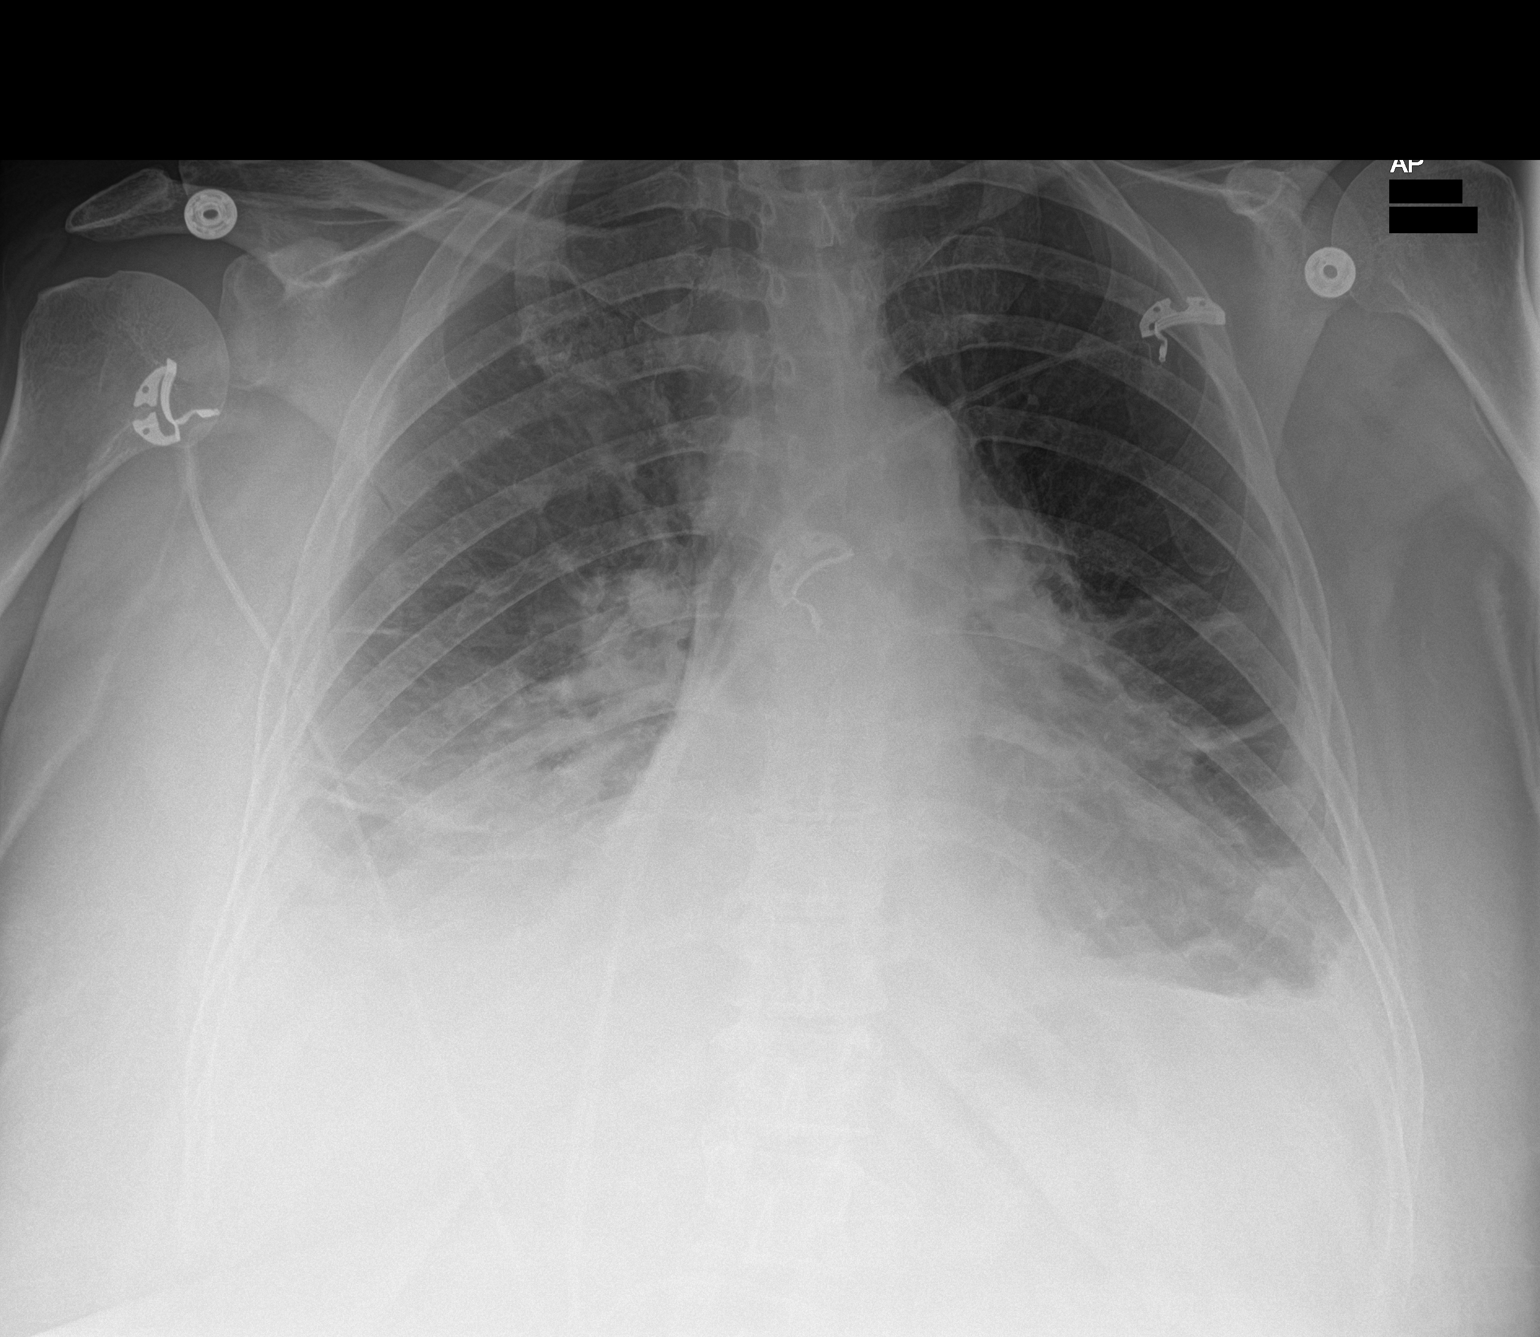

[1 of 1 positions shown; findings below may reference images not displayed]

FINDINGS: Interval removal of right chest tube. No pneumothorax. Persistent
patchy airspace opacities at the lung bases, right worse than left.
Persistent small pleural effusions bilaterally.

Heart size and mediastinal contours are within normal limits.

Visualized bones unremarkable.
IMPRESSION: 1. Right chest tube removal with no pneumothorax.
2. Persistent small pleural effusions and patchy bibasilar airspace
disease, right greater than left.

## 2022-03-01 MED ORDER — POTASSIUM CHLORIDE CRYS ER 20 MEQ PO TBCR
40.0000 meq | EXTENDED_RELEASE_TABLET | Freq: Two times a day (BID) | ORAL | Status: AC
Start: 2022-03-01 — End: 2022-03-01
  Administered 2022-03-01 (×2): 40 meq via ORAL
  Filled 2022-03-01 (×2): qty 2

## 2022-03-01 MED ORDER — FUROSEMIDE 10 MG/ML IJ SOLN
40.0000 mg | Freq: Four times a day (QID) | INTRAMUSCULAR | Status: AC
  Administered 2022-03-01 (×2): 40 mg via INTRAVENOUS
  Filled 2022-03-01 (×2): qty 4

## 2022-03-01 MED ORDER — VANCOMYCIN HCL 1250 MG/250ML IV SOLN
1250.0000 mg | Freq: Two times a day (BID) | INTRAVENOUS | Status: DC
  Administered 2022-03-01 – 2022-03-04 (×6): 1250 mg via INTRAVENOUS
  Filled 2022-03-01 (×7): qty 250

## 2022-03-01 NOTE — Telephone Encounter (Signed)
4 week f/u in [facility] office with whoever is on clinic that week. ? ?Thanks, ?Dan ?

## 2022-03-01 NOTE — Progress Notes (Signed)
?PROGRESS NOTE ? ?Beverly Tran WUJ:[id number] DOB: 1970-11-28 DOA: 02/24/2022 ?[facility] ? ? LOS: 5 days  ? ?Brief Narrative / Interim history: ?51 year old female with history of COPD and chronic hypoxic respiratory failure on 5 L of oxygen at home, admitted to the hospital with acute on chronic respiratory failure.  Imaging on admission showed a right-sided pleural effusion, multiloculated, concerning for empyema.  She was placed on IV antibiotics and pulmonary were consulted.  Initially she was admitted to [facility], however she was transferred to [facility] for drain placement and potential lytics. ? ?Subjective / 24h Interval events: ?Feeling better.  Breathing almost back to baseline.  No chest pain, no abdominal pain, no nausea or vomiting ? ?Assesement and Plan: ?Principal Problem: ?  Acute on chronic respiratory failure (HCC) ?Active Problems: ?  Essential hypertension ?  Gastroesophageal reflux disease without esophagitis ?  Sepsis (HCC) ?  Hyperglycemia due to diabetes mellitus (HCC) ?  Mixed hyperlipidemia ?  Restless leg syndrome ?  Diabetic neuropathy (HCC) ?  COPD (chronic obstructive pulmonary disease) (HCC) ?  Acute metabolic encephalopathy ?  HCAP (healthcare-associated pneumonia) ?  Anxiety ?  Empyema of right pleural space (HCC) ?  Pleural effusion ?  MRSA bacteremia ?  Empyema (HCC) ?  Medication monitoring encounter ? ? ?Principal problem ?Sepsis due to probable empyema, MRSA bacteremia-patient apparently had a hospitalization at [facility] in [place] about 2 weeks ago with sepsis and multifocal pneumonia, and ended up having MRSA bacteremia.  Unfortunately she left AMA.  She presented to [facility] on 5/11 with shortness of breath, and imaging was concerning about pneumonia as well as a complicated pleural effusion/empyema.  She was [facility], underwent chest tube placement and is status post fibrinolytic treatment.   Chest tube was removed 5/15.  Blood cultures here are negative, but her MRSA bacteremia was never fully treated.  ID was consulted, now she is on vancomycin and awaiting a TEE next Thursday.  Her presentation appears consistent with MRSA pneumonia and empyema.  Pulmonology feels like VATS with decortication would be high risk of creating bronchopleural fistula due to neighboring necrotic lung.  For now plans are for prolonged antibiotics as per ID ? ?Active problems ?Acute on chronic hypoxic respiratory failure due to #1 -respiratory status is improved and she is now back to baseline.  On admission she required BiPAP, but currently on 5 L which is her home oxygen level  ? ?COPD-continue nebulizers, no wheezing, respiratory status stable ? ?Hypokalemia-recheck in the morning.  Replete as indicated ? ?Anxiety-resume home medications ? ?Essential hypertension-blood pressure remains normal this morning, hold home agents ? ?Hyperlipidemia-continue statin ? ?Iron deficiency anemia-resume home iron ? ?Diabetes mellitus-continue sliding scale, monitor CBGs.  Continue gabapentin for her diabetic neuropathy.  CBGs with good control ? ?CBG (last 3)  ?Recent Labs  ?  02/28/22 ?2043 03/01/22 ?[id number] 03/01/22 ?1120  ?GLUCAP 148* 192* 227*  ? ? ? ?Scheduled Meds: ? arformoterol  15 mcg Nebulization BID  ? budesonide (PULMICORT) nebulizer solution  0.25 mg Nebulization BID  ? Chlorhexidine Gluconate Cloth  6 each Topical Q0600  ? DULoxetine  60 mg Oral Daily  ? ferrous sulfate  325 mg Oral BID  ? furosemide  40 mg Intravenous Q6H  ? gabapentin  600 mg Oral TID  ? heparin  5,000 Units Subcutaneous Q8H  ? insulin aspart  0-15 Units Subcutaneous TID AC & HS  ? ipratropium-albuterol  3  mL Nebulization Q6H  ? mouth rinse  15 mL Mouth Rinse q12n4p  ? mupirocin ointment   Nasal BID  ? pantoprazole  40 mg Oral Daily  ? potassium chloride  40 mEq Oral BID  ? pramipexole  1 mg Oral QHS  ? QUEtiapine  100 mg Oral QHS  ? rosuvastatin  10 mg Oral  QHS  ? ?Continuous Infusions: ? sodium chloride 10 mL/hr at 02/28/22 2223  ? ampicillin-sulbactam (UNASYN) IV 3 g (03/01/22 0840)  ? vancomycin 1,500 mg (02/28/22 1619)  ? ?PRN Meds:.acetaminophen, acetaminophen, clonazePAM, hydrOXYzine, ondansetron **OR** ondansetron (ZOFRAN) IV, oxyCODONE ? ?Diet Orders (From admission, onward)  ? ?  Start     Ordered  ? 02/26/22 1027  Diet Carb Modified Fluid consistency: Thin; Room service appropriate? Yes  Diet effective now       ?Question Answer Comment  ?Diet-HS Snack? Nothing   ?Calorie Level Medium 1600-2000   ?Fluid consistency: Thin   ?Room service appropriate? Yes   ?  ? 02/26/22 1026  ? ?  ?  ? ?  ? ? ?DVT prophylaxis: heparin injection 5,000 Units Start: 02/24/22 2200 ? ? ?Lab Results  ?Component Value Date  ? PLT 362 02/28/2022  ? ? ?  Code Status: Full Code ? ?Family Communication: no family at bedside  ? ?Status is: Inpatient ? ?Remains inpatient appropriate because: severity of illness ? ?Level of care: Progressive ? ?Consultants:  ?Pulmonology ? ?Procedures:  ?Chest tube placement 5/13 ? ?Microbiology  ?Blood cultures 5/11 >> no growth to date ? ?Antimicrobials: ?Vancomycin 5/11  ?Cefepime 5/11 ? ? ?Objective: ?Vitals:  ? 03/01/22 0619 03/01/22 0634 03/01/22 0826 03/01/22 1123  ?BP:   122/61 (!) 113/58  ?Pulse: (!) 108 97 100 99  ?Resp: (!) 28 (!) 26 14 20   ?Temp:   98.6 ?F (37 ?C) 98.9 ?F (37.2 ?C)  ?TempSrc:   Oral Oral  ?SpO2: (S) (!) 85% 92% 96% 94%  ?Weight:      ?Height:      ? ? ?Intake/Output Summary (Last 24 hours) at 03/01/2022 1149 ?Last data filed at 03/01/2022 0836 ?Gross per 24 hour  ?Intake 1606.01 ml  ?Output 2001 ml  ?Net -394.99 ml  ? ? ?Wt Readings from Last 3 Encounters:  ?02/24/22 99.3 kg  ?02/23/22 103.1 kg  ?04/06/21 99.7 kg  ? ? ?Examination: ? ?Constitutional: NAD ?Eyes: lids and conjunctivae normal, no scleral icterus ?ENMT: mmm ?Neck: normal, supple ?Respiratory: Diffuse faint bibasilar rhonchi, no wheezing ?Cardiovascular: Regular rate  and rhythm, no murmurs / rubs / gallops.  Trace LE edema. ?Abdomen: soft, no distention, no tenderness. Bowel sounds positive.  ?Skin: no rashes ?Neurologic: no focal deficits, equal strength ? ? ? ?Data Reviewed: I have independently reviewed following labs and imaging studies  ? ?CBC ?Recent Labs  ?Lab 02/24/22 ?1218 02/25/22 ?0640 02/26/22 ?02/28/22 02/27/22 ?03/01/22 02/28/22 ?03/02/22  ?WBC 19.2* 21.3* 23.4* 14.4* 11.3*  ?HGB 12.7 12.6 13.6 10.9* 11.8*  ?HCT 39.7 40.3 42.7 34.2* 37.7  ?PLT 277 249 309 319 362  ?MCV 96.6 98.3 98.4 98.0 97.4  ?MCH 30.9 30.7 31.3 31.2 30.5  ?MCHC 32.0 31.3 31.9 31.9 31.3  ?RDW 16.9* 17.1* 17.0* 16.9* 16.7*  ?LYMPHSABS 2.1  --   --   --   --   ?MONOABS 0.9  --   --   --   --   ?EOSABS 0.1  --   --   --   --   ?BASOSABS 0.0  --   --   --   --   ? ? ? ?  Recent Labs  ?Lab 02/24/22 ?1218 02/25/22 ?0640 02/26/22 ?3532 02/27/22 ?9924 02/28/22 ?2683  ?NA 135 137 136 135 135  ?K 3.7 3.6 3.4* 3.4* 3.7  ?CL 91* 96* 95* 99 102  ?CO2 35* 33* 30 25 26   ?GLUCOSE 144* 113* 101* 115* 131*  ?BUN 11 9 8 10 7   ?CREATININE 0.40* 0.38* 0.53 0.49 0.39*  ?CALCIUM 8.4* 8.6* 9.0 8.3* 8.6*  ?AST 17 15 16   --   --   ?ALT 26 22 21   --   --   ?ALKPHOS 48 50 54  --   --   ?BILITOT 0.7 0.6 0.8  --   --   ?ALBUMIN 2.8* 2.7* 2.5*  --   --   ?MG  --   --   --   --  1.8  ?PROCALCITON <0.10 <0.10 0.11  --   --   ?TSH  --  1.057  --   --   --   ?BNP 80.0  --   --   --   --   ? ? ? ?------------------------------------------------------------------------------------------------------------------ ?No results for input(s): CHOL, HDL, LDLCALC, TRIG, CHOLHDL, LDLDIRECT in the last 72 hours. ? ?Lab Results  ?Component Value Date  ? HGBA1C 7.0 (H) 02/16/2022  ? ?------------------------------------------------------------------------------------------------------------------ ?No results for input(s): TSH, T4TOTAL, T3FREE, THYROIDAB in the last 72 hours. ? ?Invalid input(s): FREET3 ? ? ?Cardiac Enzymes ?No results for input(s): CKMB,  TROPONINI, MYOGLOBIN in the last 168 hours. ? ?Invalid input(s): CK ?------------------------------------------------------------------------------------------------------------------ ?   ?Component Value Dat

## 2022-03-01 NOTE — Evaluation (Signed)
Physical Therapy Evaluation ?Patient Details ?Name: Beverly Tran ?MRN: [id number] ?DOB: 03/29/1971 ?Today's Date: 03/01/2022 ? ?History of Present Illness ? Patient is a 51 y/o female who presents on 5/11 with SOB and hypoxia with Sp02 in 70s, after leaving AMA on 5/10 (5/3-5/10 admission). Admitted with sepsis secondary to MRSA bacteremia and acute on chronic respiratory failure. CXR-right-sided pleural effusion, multiloculated, concerning for empyema s/p chest tube placement 5/13. PMH includes anxiety, depression, HTN, COPD, and chronic hypoxic respiratory failure on 5 L of oxygen at home.  ?Clinical Impression ? Patient presents with generalized weakness, decreased endurance and impaired mobility s/p above. Pt lives at home with family and uses rollator for ambulation PTA. Pt does not do any IADLs due to hx of back/knee arthritis/pain. Today, pt tolerated transfers and ambulation supervision progressing to mod I with use of rollator for support. Sp02 remained >88% on 6L/min 02 [place]. Pt reports using 5L at baseline. Encouraged walking with mobility techs and nursing while in the hospital. Anticipate strength and mobility will improve with increased activity. Declining any follow up therapy. All education complete. Discharge from therapy.   ?   ? ?Recommendations for follow up therapy are one component of a multi-disciplinary discharge planning process, led by the attending physician.  Recommendations may be updated based on patient status, additional functional criteria and insurance authorization. ? ?Follow Up Recommendations No PT follow up ? ?  ?Assistance Recommended at Discharge PRN  ?Patient can return home with the following ? Help with stairs or ramp for entrance;Assistance with cooking/housework;Assist for transportation ? ?  ?Equipment Recommendations None recommended by PT  ?Recommendations for Other Services ?    ?  ?Functional Status Assessment Patient has had a recent decline in their functional  status and demonstrates the ability to make significant improvements in function in a reasonable and predictable amount of time.  ? ?  ?Precautions / Restrictions Precautions ?Precautions: Other (comment) ?Precaution Comments: watch 02 ?Restrictions ?Weight Bearing Restrictions: No  ? ?  ? ?Mobility ? Bed Mobility ?  ?  ?  ?  ?  ?  ?  ?General bed mobility comments: Up in chair upon PT arrival. ?  ? ?Transfers ?Overall transfer level: Needs assistance ?Equipment used: Rollator (4 wheels) ?Transfers: Sit to/from Stand ?Sit to Stand: Modified independent (Device/Increase time) ?  ?  ?  ?  ?  ?General transfer comment: No assist needed. Stood from Orthoptist. ?  ? ?Ambulation/Gait ?Ambulation/Gait assistance: Supervision, Modified independent (Device/Increase time) ?Gait Distance (Feet): 350 Feet ?Assistive device: Rollator (4 wheels) ?Gait Pattern/deviations: Step-through pattern, Decreased stride length, Shuffle ?  ?Gait velocity interpretation: 1.31 - 2.62 ft/sec, indicative of limited community ambulator ?  ?General Gait Details: Slow, steady gait with decreased foot clearance bilaterally. ? ?Stairs ?  ?  ?  ?  ?  ? ?Wheelchair Mobility ?  ? ?Modified Rankin (Stroke Patients Only) ?  ? ?  ? ?Balance Overall balance assessment: No apparent balance deficits (not formally assessed) ?  ?  ?  ?  ?  ?  ?  ?  ?  ?  ?  ?  ?  ?  ?  ?  ?  ?  ?   ? ? ? ?Pertinent Vitals/Pain Pain Assessment ?Pain Assessment: No/denies pain  ? ? ?Home Living Family/patient expects to be discharged to:: Private residence ?Living Arrangements: Spouse/significant other;Other relatives ?Available Help at Discharge: Family;Available PRN/intermittently ?Type of Home: Apartment ?Home Access: Ramped entrance ?  ?  ?  ?  Home Layout: One level ?Home Equipment: Rollator (4 wheels);Cane - single point;Wheelchair - Forensic psychologist (2 wheels);Shower seat ?   ?  ?Prior Function Prior Level of Function : Independent/Modified Independent ?  ?  ?  ?  ?  ?   ?Mobility Comments: Uses rollator for ambulation ?ADLs Comments: Independent, does not do IADLs, does not drive. ?  ? ? ?Hand Dominance  ?   ? ?  ?Extremity/Trunk Assessment  ? Upper Extremity Assessment ?Upper Extremity Assessment: Defer to OT evaluation ?  ? ?Lower Extremity Assessment ?Lower Extremity Assessment: Generalized weakness (but functional) ?  ? ?Cervical / Trunk Assessment ?Cervical / Trunk Assessment: Normal  ?Communication  ? Communication: No difficulties  ?Cognition Arousal/Alertness: Awake/alert ?Behavior During Therapy: [facility] for tasks assessed/performed ?Overall Cognitive Status: Within Functional Limits for tasks assessed ?  ?  ?  ?  ?  ?  ?  ?  ?  ?  ?  ?  ?  ?  ?  ?  ?  ?  ?  ? ?  ?General Comments General comments (skin integrity, edema, etc.): Sp02 remained >88% on 6L/min 02 [place] ? ?  ?Exercises    ? ?Assessment/Plan  ?  ?PT Assessment Patient does not need any further PT services  ?PT Problem List   ? ?   ?  ?PT Treatment Interventions     ? ?PT Goals (Current goals can be found in the Care Plan section)  ?Acute Rehab PT Goals ?Patient Stated Goal: to go home ?PT Goal Formulation: All assessment and education complete, DC therapy ? ?  ?Frequency   ?  ? ? ?Co-evaluation   ?  ?  ?  ?  ? ? ?  ?AM-PAC PT "6 Clicks" Mobility  ?Outcome Measure Help needed turning from your back to your side while in a flat bed without using bedrails?: None ?Help needed moving from lying on your back to sitting on the side of a flat bed without using bedrails?: None ?Help needed moving to and from a bed to a chair (including a wheelchair)?: None ?Help needed standing up from a chair using your arms (e.g., wheelchair or bedside chair)?: None ?Help needed to walk in hospital room?: A Little ?Help needed climbing 3-5 steps with a railing? : A Little ?6 Click Score: 22 ? ?  ?End of Session Equipment Utilized During Treatment: Oxygen ?Activity Tolerance: Patient tolerated treatment well ?Patient left: in chair;with call  bell/phone within reach ?Nurse Communication: Mobility status ?PT Visit Diagnosis: Muscle weakness (generalized) (M62.81);Difficulty in walking, not elsewhere classified (R26.2) ?  ? ?Time: 1247-1310 ?PT Time Calculation (min) (ACUTE ONLY): 23 min ? ? ?Charges:   PT Evaluation ?$PT Eval Moderate Complexity: 1 Mod ?PT Treatments ?$Gait Training: 8-22 mins ?  ?   ? ? ?Vale Haven, PT, DPT ?Acute Rehabilitation Services ?Secure chat preferred ?Office [phone] ? ? ? ? ?Blake Divine A Yarnell Kozloski ?03/01/2022, 2:53 PM ? ?

## 2022-03-01 NOTE — Progress Notes (Signed)
? ?NAME:  Beverly Tran, MRN:  [id number], DOB:  [birth year], LOS: 5 ?ADMISSION DATE:  02/24/2022, CONSULTATION DATE:  02/21/22 ?REFERRING MD:  Emokpae/Triad, CHIEF COMPLAINT:  resp distress   ? ?History of Present Illness:  ?51  yowf quit smoking 01/2020 last seen in pulmonary clinic 03/18/21 with dx cough variant asthma vs VCD flared on ACEi and better off acei and on max gerd rx s hypercarbia but using 02 prn and with rec for pfts which were  never done >>>   admitted on 02/16/2022 ? on ACEi again with dx sepsis secondary to multifocal pneumonia resulting in acute on chronic hypoxic respiratory failure requiring prolonged BiPAP use so PCCM consulted 5/8 rec wean bipap/ 02/ rx empirically for CAP but pt left ama once awake enough to refuse bipap ? ?5/11 returned in am to ER with increase sob at rest and not saturating  above 80% on her home flow reported to be 6lpm (not verified, nor clear what meds she's really taking) - c/o midline mid back pain s pleuritic component, some gen ha but no N or V or Chills or abd pain or dysuria.  W/u showed new mod large R effusion so rec to tap in ER ASAP > c/w empyema confirmed 5/11 so rec trx to [facility] for IR drain and likely will need lytics  ? ? ? ?Pertinent  Medical History  ?DM type 2 with diabetic neuropathy ?HBP ?GERD ?RLS  ? ? ?Significant Hospital Events: ?Including procedures, antibiotic start and stop dates in addition to other pertinent events   ?CTa  5/3 Severe bilateral multifocal pneumonia, most confluent in the right lower lobe.  ?Urine strep/ legionella 5/3 neg  ?MRSA PCR  5/4  POS ?Echo 5/4 ok x for RA est pressure 15  with Nl LA size but RA not well viz ?R Tcentesis 5/11 >>> too organized to drain ?Maxepime/Vanc  5/11 >>> ?CTa 5/12 c/w empyema on R > IR ([place]) rec CT directed drain/likely to need lytics ? ?Interim History / Subjective:  ?No events. ?Still fair bit of O2.  ?Not moving around much, staying in chair most of day. ? ?Objective   ?Blood pressure 122/61,  pulse 100, temperature 98.6 ?F (37 ?C), temperature source Oral, resp. rate 14, height 5\' 5"  (1.651 m), weight 99.3 kg, last menstrual period 10/13/2015, SpO2 96 %. ?   ?   ? ?Intake/Output Summary (Last 24 hours) at 03/01/2022 0854 ?Last data filed at 03/01/2022 0415 ?Gross per 24 hour  ?Intake 1366.01 ml  ?Output 400 ml  ?Net 966.01 ml  ? ? ? ?Filed Weights  ? 02/24/22 1146 02/24/22 1445  ?Weight: 103.1 kg 99.3 kg  ? ?Physical Exam: ?No distress ?Breath sounds remain diminished R base ?Aox3 ?Ext trace edema ? ?CXR maybe slightly more wet ? ?Assessment & Plan:  ? ?Acute  hypoxemic and hypercarbic resp failure  ?Left pneumonia with complicated parapneumonic effusion/suspected empyema and imaging signs of lung necrosis ?Cough variant asthma ? ?Appreciate ID input: would be very c/w MRSA PNA, agree with prolonged course of abx as outlined. ? ?Will arrange pulmonary followup at [facility] pulmonary clinic in 4 weeks to re-image and determine if further interventions or abx needed ? ?RE: CT surgery eval, I think VATS with decortication of her remaining subpulmonic collection runs high risk of creating bronchopleural fistulae due to neighboring necrotic lung.  Nor is her baseline O2-dependent COPD make her a good candidate for recovery.  Recommend against but defer to ID-primary discussion. ? ?  For remaining O2 needs, would push diuresis as tolerated by renal function (ordered additional lasix and K replacement).  Also needs to move more, hopefully this is now easier with chest tube out.  Will order PT consult, IS. ? ?Discussed with primary, will move to PRN basis but please reach out to [phone] if any decompensation or further clarification needed regarding management recs. ? ?Plan discussed with patient. ? ?Myrla Halsted MD PCCM ?

## 2022-03-01 NOTE — Progress Notes (Signed)
SPO2 at rest this is 84-89%. Pt appears no distress, RR 19-26, HR 97-110, afebrile.  ? ?RT notified. Rt recommended titrate O2 up from 5-->7-->10 PLM. Pt is well tolerated. She is comfortably sitting on the recliner. Lung sound has positive coarse crackles on right lung base with rhonchi on upper lobe bilaterally. No wheezing, stridor or pleural rub on auscultation. RT made aware.  ? ?We will continue to monitor. ? ?Filiberto Pinks, RN ?

## 2022-03-01 NOTE — Progress Notes (Signed)
Pharmacy Antibiotic Note ? ?Beverly Tran is a 51 y.o. female admitted on 02/24/2022 with pneumonia/empyema, MRSA bacteremia.  Pharmacy has been consulted for vancomycin dosing. Pt also on Unasyn. ? ?Vanc peak 23, Vanc trough <4 mcg/ml (cannot calculate actual AUC as vanc trough was < 81mcg/ml) but utilizing 4 mcg/ml - AUC would be 293 with ke= 0.0920, t/12 7.5 hrs ? ? ?Plan: ?Change Vancomycin to 1250 mg IV every 12 hours. Esimated AUC 490 ?Unasyn 3gm IV q6h ?Will recheck AUC at Css on new dose ? ? ?Height: 5\' 5"  (165.1 cm) ?Weight: 99.3 kg (218 lb 14.7 oz) ?IBW/kg (Calculated) : 57 ? ?Temp (24hrs), Avg:98.7 ?F (37.1 ?C), Min:98.1 ?F (36.7 ?C), Max:99.2 ?F (37.3 ?C) ? ?Recent Labs  ?Lab 02/24/22 ?1218 02/25/22 ?0640 02/26/22 ?02/28/22 02/27/22 ?03/01/22 02/28/22 ?03/02/22 02/28/22 ?2005 03/01/22 ?1506  ?WBC 19.2* 21.3* 23.4* 14.4* 11.3*  --   --   ?CREATININE 0.40* 0.38* 0.53 0.49 0.39*  --   --   ?VANCOTROUGH  --   --   --   --   --   --  <4*  ?VANCOPEAK  --   --   --   --   --  23*  --   ? ?  ?Estimated Creatinine Clearance: 98.1 mL/min (A) (by C-G formula based on SCr of 0.39 mg/dL (L)).   ? ?Allergies  ?Allergen Reactions  ? Diclofenac Anaphylaxis  ? 03/03/22 Meat] Shortness Of Breath and Swelling  ?  Also Allergic to POPCORN: same reaction  ? Aspirin Other (See Comments)  ?  Nose bleeds  ? Bee Venom Swelling  ?  WASP/HORNET  ? Compazine [Prochlorperazine] Hives  ? Imitrex [Sumatriptan] Hives  ? Lamotrigine Other (See Comments)  ?  confusion  ? Nubain [Nalbuphine Hcl] Swelling and Other (See Comments)  ?  Eye swelling  ? Thorazine [Chlorpromazine] Hives  ? Topamax [Topiramate] Other (See Comments)  ?  Confusion and out of it  ? Voltaren [Diclofenac Sodium] Swelling  ? Zofran [Ondansetron Hcl] Nausea Only  ? ? ?Antimicrobials this admission: ?Vanco 5/11 >> ?Cefepime 5/11 >> 5/15 ?Unasyn 5/15 >> ? ?Dose adjustments this admission: ?5/15-16 VP 23, VT <4 - change dose to 1250mg  IV q12h ? ?Microbiology results: ?5/11  Pleural Fluid: neg ?5/1 Bcx: neg ?5/2 BCx outside hospital: MRSA in 2/2  ? ? ?Thank you for allowing pharmacy to be a part of this patient?s care. ? ? , PharmD, BCPS ?Please see amion for complete clinical pharmacist phone list ?03/01/2022 4:05 PM ? ?

## 2022-03-02 ENCOUNTER — Ambulatory Visit: Payer: Self-pay | Admitting: Internal Medicine

## 2022-03-02 DIAGNOSIS — J869 Pyothorax without fistula: Secondary | ICD-10-CM | POA: Diagnosis not present

## 2022-03-02 DIAGNOSIS — I10 Essential (primary) hypertension: Secondary | ICD-10-CM | POA: Diagnosis not present

## 2022-03-02 DIAGNOSIS — E1169 Type 2 diabetes mellitus with other specified complication: Secondary | ICD-10-CM | POA: Diagnosis not present

## 2022-03-02 DIAGNOSIS — K219 Gastro-esophageal reflux disease without esophagitis: Secondary | ICD-10-CM

## 2022-03-02 DIAGNOSIS — D509 Iron deficiency anemia, unspecified: Secondary | ICD-10-CM | POA: Diagnosis present

## 2022-03-02 DIAGNOSIS — E876 Hypokalemia: Secondary | ICD-10-CM | POA: Diagnosis present

## 2022-03-02 DIAGNOSIS — E785 Hyperlipidemia, unspecified: Secondary | ICD-10-CM

## 2022-03-02 DIAGNOSIS — J449 Chronic obstructive pulmonary disease, unspecified: Secondary | ICD-10-CM | POA: Diagnosis not present

## 2022-03-02 DIAGNOSIS — E669 Obesity, unspecified: Secondary | ICD-10-CM

## 2022-03-02 DIAGNOSIS — E66812 Obesity, class 2: Secondary | ICD-10-CM | POA: Diagnosis present

## 2022-03-02 LAB — BASIC METABOLIC PANEL
Anion gap: 6 (ref 5–15)
BUN: 10 mg/dL (ref 6–20)
CO2: 32 mmol/L (ref 22–32)
Calcium: 8.7 mg/dL — ABNORMAL LOW (ref 8.9–10.3)
Chloride: 97 mmol/L — ABNORMAL LOW (ref 98–111)
Creatinine, Ser: 0.38 mg/dL — ABNORMAL LOW (ref 0.44–1.00)
GFR, Estimated: >60 mL/min (ref >60)
Glucose, Bld: 150 mg/dL — ABNORMAL HIGH (ref 70–99)
Potassium: 4.3 mmol/L (ref 3.5–5.1)
Sodium: 135 mmol/L (ref 135–145)

## 2022-03-02 LAB — GLUCOSE, CAPILLARY
Glucose-Capillary: 138 mg/dL — ABNORMAL HIGH (ref 70–99)
Glucose-Capillary: 250 mg/dL — ABNORMAL HIGH (ref 70–99)
Glucose-Capillary: 169 mg/dL — ABNORMAL HIGH (ref 70–99)
Glucose-Capillary: 150 mg/dL — ABNORMAL HIGH (ref 70–99)

## 2022-03-02 LAB — CBC
HCT: 31.5 % — ABNORMAL LOW (ref 36.0–46.0)
Hemoglobin: 10 g/dL — ABNORMAL LOW (ref 12.0–15.0)
MCH: 31.3 pg (ref 26.0–34.0)
MCHC: 31.7 g/dL (ref 30.0–36.0)
MCV: 98.7 fL (ref 80.0–100.0)
Platelets: 405 10*3/uL — ABNORMAL HIGH (ref 150–400)
RBC: 3.19 MIL/uL — ABNORMAL LOW (ref 3.87–5.11)
RDW: 16.6 % — ABNORMAL HIGH (ref 11.5–15.5)
WBC: 9.7 10*3/uL (ref 4.0–10.5)
nRBC: 0.4 % — ABNORMAL HIGH (ref 0.0–0.2)

## 2022-03-02 LAB — MAGNESIUM: Magnesium: 1.9 mg/dL (ref 1.7–2.4)

## 2022-03-02 MED ORDER — CLONAZEPAM 0.5 MG PO TBDP
0.5000 mg | ORAL_TABLET | Freq: Two times a day (BID) | ORAL | Status: DC
  Administered 2022-03-02 – 2022-03-04 (×4): 0.5 mg via ORAL
  Filled 2022-03-02 (×4): qty 1

## 2022-03-02 NOTE — Hospital Course (Addendum)
Mrs. Vanleeuwen was admitted to the hospital with the working diagnosis of sepsis due to pneumonia, complicated with empyema.   51 yo female with the past medical history of COPD, T2DM, hypertension and obesity class 2 who presented with dyspnea. Initially admitted to community acquired pneumonia on 02/16/22, she had high oxygen requirements, and required non invasive mechanical ventilation. Her blood cultures were positive for MRSA. On 02/23/22 she left the hospital against medical advice. She return the following day with worsening dyspnea. At home 02 saturation was 77% on 6 L/min of supplemental 02 per [place]. She was in respiratory distress and placed back on Bipap, her blood pressure was 119/100, HR 101, RR 22, temp 101.3 and 02 saturation 86%, lungs with no wheezing, positive rales at the rifht side with decreased breath sounds, heart with S1 and S2 present and rhythmic, abdomen not distended and positive lower extremity edema.   Chest radiograph with right pleural effusion and bilateral lower lobes interstitial infiltrates.  CT chest with no pulmonary embolism, worsening multifocal pneumonia with ground glass opacities of the left lower lobe and signs of necrotic pneumonia on the right lower lobe. Moderate loculated right pleural effusion.  Small left pleural effusion.   Patient was placed on broad spectrum antibiotic and pulmonary was consulted.  She was transferred from [facility] to [facility], for further management.  Chest tube was placed to the right pleural effusion and received fibrinolytic therapy with good toleration. 05/15 chest tube was removed.   Further work up with TEE was negative for vegetations, and antibiotic therapy was changed from IV vancomycin to oral linezolid to continue until 03/26/22, to complete 4 weeks from 02/26/22.   Oxygen requirements have improved, and patient will follow up as outpatient.

## 2022-03-02 NOTE — Assessment & Plan Note (Addendum)
Electrolytes were corrected, at the time of her discharge her renal function is stable with serum cr at 0.41, K is 3,9 and serum bicarbonate at 32.   Plan to hold on diuretic therapy and follow up renal function as outpatient.

## 2022-03-02 NOTE — Plan of Care (Signed)
Pt A&Ox4. VSS. Pain well managed according to pt. Ambulated in hallway with walker. Plan for TEE Thursday 5/18. IV abx treatment. ? ?Problem: Activity: ?Goal: Ability to tolerate increased activity will improve ?Outcome: Progressing ?  ?Problem: Clinical Measurements: ?Goal: Ability to maintain a body temperature in the normal range will improve ?Outcome: Progressing ?  ?Problem: Respiratory: ?Goal: Ability to maintain adequate ventilation will improve ?Outcome: Progressing ?Goal: Ability to maintain a clear airway will improve ?Outcome: Progressing ?  ?Problem: Education: ?Goal: Knowledge of disease or condition will improve ?Outcome: Progressing ?Goal: Knowledge of the prescribed therapeutic regimen will improve ?Outcome: Progressing ?Goal: Individualized Educational Video(s) ?Outcome: Progressing ?  ?Problem: Activity: ?Goal: Ability to tolerate increased activity will improve ?Outcome: Progressing ?Goal: Will verbalize the importance of balancing activity with adequate rest periods ?Outcome: Progressing ?  ?Problem: Respiratory: ?Goal: Ability to maintain a clear airway will improve ?Outcome: Progressing ?Goal: Levels of oxygenation will improve ?Outcome: Progressing ?Goal: Ability to maintain adequate ventilation will improve ?Outcome: Progressing ?  ?Problem: Education: ?Goal: Knowledge of disease or condition will improve ?Outcome: Progressing ?Goal: Knowledge of the prescribed therapeutic regimen will improve ?Outcome: Progressing ?Goal: Individualized Educational Video(s) ?Outcome: Progressing ?  ?Problem: Activity: ?Goal: Ability to tolerate increased activity will improve ?Outcome: Progressing ?Goal: Will verbalize the importance of balancing activity with adequate rest periods ?Outcome: Progressing ?  ?Problem: Respiratory: ?Goal: Ability to maintain a clear airway will improve ?Outcome: Progressing ?Goal: Levels of oxygenation will improve ?Outcome: Progressing ?Goal: Ability to maintain adequate  ventilation will improve ?Outcome: Progressing ?  ?Problem: Education: ?Goal: Knowledge of General Education information will improve ?Description: Including pain rating scale, medication(s)/side effects and non-pharmacologic comfort measures ?Outcome: Progressing ?  ?Problem: Health Behavior/Discharge Planning: ?Goal: Ability to manage health-related needs will improve ?Outcome: Progressing ?  ?Problem: Clinical Measurements: ?Goal: Ability to maintain clinical measurements within normal limits will improve ?Outcome: Progressing ?Goal: Will remain free from infection ?Outcome: Progressing ?Goal: Diagnostic test results will improve ?Outcome: Progressing ?Goal: Respiratory complications will improve ?Outcome: Progressing ?Goal: Cardiovascular complication will be avoided ?Outcome: Progressing ?  ?Problem: Activity: ?Goal: Risk for activity intolerance will decrease ?Outcome: Progressing ?  ?Problem: Nutrition: ?Goal: Adequate nutrition will be maintained ?Outcome: Progressing ?  ?Problem: Coping: ?Goal: Level of anxiety will decrease ?Outcome: Progressing ?  ?Problem: Elimination: ?Goal: Will not experience complications related to bowel motility ?Outcome: Progressing ?Goal: Will not experience complications related to urinary retention ?Outcome: Progressing ?  ?Problem: Pain Managment: ?Goal: General experience of comfort will improve ?Outcome: Progressing ?  ?Problem: Safety: ?Goal: Ability to remain free from injury will improve ?Outcome: Progressing ?  ?Problem: Skin Integrity: ?Goal: Risk for impaired skin integrity will decrease ?Outcome: Progressing ?  ?

## 2022-03-02 NOTE — H&P (View-Only) (Signed)
    [facility] has been requested to perform a transesophageal echocardiogram on 03/03/2022 for bacteremia.  After careful review of history and examination, the risks and benefits of transesophageal echocardiogram have been explained including risks of esophageal damage, perforation (1:10,000 risk), bleeding, pharyngeal hematoma as well as other potential complications associated with anesthesia including aspiration, arrhythmia, respiratory failure and death. Alternatives to treatment were discussed, questions were answered. Patient is willing to proceed.   Patient with COPD on 5 L home O2 24/7, DM 2, hypertension, and obesity who was recently admitted with pneumonia in early May require high flow oxygen returned with worsening dyspnea.  She was found to have empyema and MRSA bacteremia.  Vital signs and a heart rate is stable.  Talking with the patient, she has been on 5 L oxygen 24/7 for at least a year. Her current oxygen requirement is not far from baseline.  Azalee Course, [place] 03/02/2022 6:33 PM

## 2022-03-02 NOTE — Progress Notes (Signed)
? ? ?  [facility] has been requested to perform a transesophageal echocardiogram on 03/03/2022 for bacteremia.  After careful review of history and examination, the risks and benefits of transesophageal echocardiogram have been explained including risks of esophageal damage, perforation (1:10,000 risk), bleeding, pharyngeal hematoma as well as other potential complications associated with anesthesia including aspiration, arrhythmia, respiratory failure and death. Alternatives to treatment were discussed, questions were answered. Patient is willing to proceed.   Patient with COPD on 5 L home O2 24/7, DM 2, hypertension, and obesity who was recently admitted with pneumonia in early May require high flow oxygen returned with worsening dyspnea.  She was found to have empyema and MRSA bacteremia.  Vital signs and a heart rate is stable.  Talking with the patient, she has been on 5 L oxygen 24/7 for at least a year. Her current oxygen requirement is not far from baseline.  Beverly Burdine, PA-C 03/02/2022 6:33 PM   

## 2022-03-02 NOTE — Progress Notes (Addendum)
[facility] Infectious Diseases Follow Up Note  Patient Identification: Patient Name: Beverly Tran MRN: [id number] [place] Date: 02/24/2022 11:43 AM Age: 51 y.o.Today's Date: 03/02/2022  Reason for Visit: MRSA bacteremia and empyema   Principal Problem:   Acute on chronic respiratory failure ([place]) Active Problems:   Essential hypertension   Gastroesophageal reflux disease without esophagitis   Sepsis ([place])   Hyperglycemia due to diabetes mellitus ([place])   Mixed hyperlipidemia   Restless leg syndrome   Diabetic neuropathy (HCC)   COPD (chronic obstructive pulmonary disease) (HCC)   Acute metabolic encephalopathy   HCAP (healthcare-associated pneumonia)   Anxiety   Empyema of right pleural space (HCC)   Pleural effusion   MRSA bacteremia   Empyema (HCC)   Medication monitoring encounter   Antibiotics:  Vancomycin 5/11-c Cefepime 5/11-5/14, Unasyn 5/15-c  Lines/Hardwares:  Interval Events: remains afebrile, leukocytosis has resolved. TEE planned for 5/18  Assessment # High grade MRSA bacteremia r/o endocarditis - Repeat blood cx 5/3 and 5/11 no growth  - TTE 5/4 negative - TEE 5/18 negative for vegetations/endocarditis    # Acute Hypoxemic  Respiratory Failure 2/2 below   # RT loculated Empyema/RLL PNA s/p rt chest tube ( cx no growth ), s/p fibrinolytics - Evaluated by Pulmonary, high risk candidate for VATS w decortication per Pulmonary. Chest tube removed 5/15.  Recommended at least 4 weeks of abtx with a fu imaging. Plan to follow OP  Recommendations Continue Vancomycin, pharmacy to dose DC Unasyn as high likely this is MRSA bacteremia complicated with empyema given recent MRSA bacteremia at OSH Fu TEE planned for 5/18 Monitor CBC, BMP, Vancomycin trough Final recs to follow pending TEE  Following  Rest of the management as per the primary team. Thank you for the consult. Please page with pertinent  questions or concerns.  ______________________________________________________________________ Subjective patient seen and examined at the bedside.  On 7 L [place] Doing well Aware about TEE tomorrow  Vitals BP 123/66 (BP Location: Right Wrist)   Pulse 100   Temp 99 F (37.2 C) (Oral)   Resp 20   Ht 5\' 5"  (1.651 m)   Wt 99.3 kg   LMP 10/13/2015   SpO2 92%   BMI 36.43 kg/m     Physical Exam Constitutional:  lying in the bed and on 7L [place], obese     Comments:   Cardiovascular:     Rate and Rhythm: Normal rate and regular rhythm.     Heart sounds:   Pulmonary:     Effort: Pulmonary effort is normal on     Comments: decreased air entry in the rt lung  Abdominal:     Palpations: Abdomen is soft.     Tenderness: non distended   Musculoskeletal:        General: No swelling or tenderness in peripheral joints. No spine tenderness  Neurological:     General: awake, alert and oriented, non focal   Psychiatric:        Mood and Affect: Mood normal.   Pertinent Microbiology Results for orders placed or performed during the hospital encounter of 02/24/22  Culture, blood (routine x 2) Call MD if unable to obtain prior to antibiotics being given     Status: None   Collection Time: 02/24/22  6:07 PM   Specimen: BLOOD RIGHT HAND  Result Value Ref Range Status   Specimen Description BLOOD RIGHT HAND  Final   Special Requests   Final    BOTTLES DRAWN AEROBIC AND ANAEROBIC  Blood Culture adequate volume   Culture   Final    NO GROWTH 5 DAYS Performed at [facility], [address], [place], [place] [address]    Report Status 03/01/2022 FINAL  Final  Culture, blood (routine x 2) Call MD if unable to obtain prior to antibiotics being given     Status: None   Collection Time: 02/24/22  6:09 PM   Specimen: BLOOD LEFT HAND  Result Value Ref Range Status   Specimen Description BLOOD LEFT HAND  Final   Special Requests   Final    BOTTLES DRAWN AEROBIC AND ANAEROBIC Blood Culture  adequate volume   Culture   Final    NO GROWTH 5 DAYS Performed at [facility], [address] [place], [place] [address]    Report Status 03/01/2022 FINAL  Final  Body fluid culture w Gram Stain     Status: None   Collection Time: 02/26/22 10:23 AM   Specimen: Pleural Fluid  Result Value Ref Range Status   Specimen Description PLEURAL  Final   Special Requests NONE  Final   Gram Stain   Final    FEW WBC PRESENT,BOTH PMN AND MONONUCLEAR NO ORGANISMS SEEN    Culture   Final    NO GROWTH Performed at [facility], [place] [address] [place], [place] [address]    Report Status 03/01/2022 FINAL  Final     Pertinent Lab.    Latest Ref Rng & Units 03/02/2022    4:53 AM 02/28/2022    5:49 AM 02/27/2022    5:50 AM  CBC  WBC 4.0 - 10.5 K/uL 9.7   11.3   14.4    Hemoglobin 12.0 - 15.0 g/dL 10.0   11.8   10.9    Hematocrit 36.0 - 46.0 % 31.5   37.7   34.2    Platelets 150 - 400 K/uL 405   362   319        Latest Ref Rng & Units 03/02/2022    4:53 AM 02/28/2022    5:49 AM 02/27/2022    5:50 AM  CMP  Glucose 70 - 99 mg/dL 150   131   115    BUN 6 - 20 mg/dL 10   7   10     Creatinine 0.44 - 1.00 mg/dL 0.38   0.39   0.49    Sodium 135 - 145 mmol/L 135   135   135    Potassium 3.5 - 5.1 mmol/L 4.3   3.7   3.4    Chloride 98 - 111 mmol/L 97   102   99    CO2 22 - 32 mmol/L 32   26   25    Calcium 8.9 - 10.3 mg/dL 8.7   8.6   8.3       Pertinent Imaging today Plain films and CT images have been personally visualized and interpreted; radiology reports have been reviewed. Decision making incorporated into the Impression / Recommendations.  DG Chest Port 1 View  Result Date: 03/01/2022 CLINICAL DATA:  SOB (shortness of breath) cough EXAM: PORTABLE CHEST - 1 VIEW COMPARISON:  02/27/2022 FINDINGS: Interval removal of right chest tube. No pneumothorax. Persistent patchy airspace opacities at the lung bases, right worse than left. Persistent small pleural effusions bilaterally. Heart  size and mediastinal contours are within normal limits. Visualized bones unremarkable. IMPRESSION: 1. Right chest tube removal with no pneumothorax. 2. Persistent small pleural effusions and patchy bibasilar airspace disease,  right greater than left. Electronically Signed   By: Lucrezia Europe M.D.   On: 03/01/2022 08:47    I spent approx 52 minutes for this patient encounter including review of prior medical records, coordination of care with primary/other specialist with greater than 50% of time being face to face/counseling and discussing diagnostics/treatment plan with the patient/family.  Electronically signed by:   Rosiland Oz, MD Infectious Disease Physician [facility] Infect[facility] Pager: [phone]

## 2022-03-02 NOTE — Assessment & Plan Note (Addendum)
Blood pressure has been stable. At the time of her discharge she will resume irbesartan, propanolol and amlodipine.  Echocardiogram with preserved LV systolic function, will hold on furosemide for now, instructions for close follow up as outpatient.

## 2022-03-02 NOTE — Assessment & Plan Note (Signed)
Continue with antiacid therapy.  ?

## 2022-03-02 NOTE — Assessment & Plan Note (Signed)
Continue with iron supplementation.  

## 2022-03-02 NOTE — Progress Notes (Addendum)
?Progress Note ? ? ?Patient: Beverly Tran [id number] DOB: 12/11/1970 DOA: 02/24/2022     6 ?DOS: the patient was seen and examined on 03/02/2022 ?  ?Brief Tran course: ?Beverly Tran was admitted to the Tran with the working diagnosis of sepsis due to pneumonia, complicated with empyema.  ? ?51 yo female with the past medical history of COPD, T2DM, hypertension and obesity class 2 who presented with dyspnea. Initially admitted to community acquired pneumonia on 02/16/22, she had high oxygen requirements, including non invasive mechanical ventilation. Her blood cultures were positive for MRSA. On 02/23/22 she left the Tran against medical advice. She return the following day with worsening dyspnea. At home 02 saturation was 77% on 6 L/min of supplemental 02 per . She was in respiratory distress and placed back on Bip[place], her blood pressure was 119/100, HR 101, RR 22, temp 101.3 and 02 saturation 86%, lungs with no wheezing, heart with S1 and S2 present and rhythmic, abdomen not distended and positive lower extremity edema.  ? ?Chest radiograph with right pleural effusion and bilateral lower lobes interstitial infiltrates.  ?CT chest with no pulmonary embolism, worsening multifocal pneumonia with ground glass opacities of the left lower lobe and signs of necrotic pneumonia on the right lower lobe.  ?Small left pleural effusion.  ? ?Patient was placed on broad spectrum antibiotic and pulmonary was consulted.  ?She was transferred from [facility] to Beverly Tran, for further management.  ?Chest tube was placed to the right pleural effusion and received fibrinolytic therapy with good toleration. ?05/15 chest tube was removed.  ? ?Plan for further work up with TEE for MRSA bacteremia. ? ? ? ? ?Assessment and Plan: ?Empyema (HCC) ?Sepsis complicated with MRSA bacteremia. ? ?Patient sp chest tube drainage of empyema, present on admission. ?Chest pain has been improving.  ? ?Plan for continue antibiotic therapy with IV  vancomycin ?Patient with high risk for creating a bronchopleural fistula and not candidate for decortications.  ? ?Pending TEE for further work up for MRSA bacteremia  ? ?Acute hypoxemic respiratory failure ?Dyspnea has been improving, 02 saturation is 90% on 7 L per [facility]NC. ? ? ?COPD (chronic obstructive pulmonary disease) (HCC) ?No clinical signs of exacerbation ?Continue bronchodilator therapy and oxymetry monitoring ?Out of bed to chair tid with meals, and encourage mobility.  ? ?Type 2 diabetes mellitus with hyperlipidemia (HCC) ?Continue glucose cover and monitoring with insulin sliding scale. ?Patient is tolerating po well.  ? ?Continue with statin therapy.  ? ?Essential hypertension ?Continue to hold on blood pressure medications.  ?Her blood pressure has been controlled.  ? ?Gastroesophageal reflux disease without esophagitis ?Continue with antiacid therapy.  ? ?Anxiety ?Will resume clonidine 0.5 bid  ? ?Hypokalemia ?Renal function stable with serum cr at 0,38 ?K is 4,3 and serum bicarbonate at 32.  ? ?Iron deficiency anemia ?Continue with iron supplementation  ? ?Class 2 obesity ?Calculated BMI is 36.4  ? ? ? ? ?  ? ?Subjective: feeling better, pleuritic chest pain is improving, out of bed to chair, positive anxiety, takes clonazepam at home 0,5 mg bid  ? ?Physical Exam: ?Vitals:  ? 03/02/22 0319 03/02/22 0532 03/02/22 0752 03/02/22 1212  ?BP: 123/66     ?Pulse: 100  100   ?Resp: 18  20 20   ?Temp: 98.8 ?F (37.1 ?C)  98.5 ?F (36.9 ?C) 99 ?F (37.2 ?C)  ?TempSrc: Oral  Oral Oral  ?SpO2: 91% 90% 92%   ?Weight:      ?Height:      ? ?  Neurology awake and alert ?ENT with mild pallor ?Cardiovascular with S1 and S2 present and rhythmic with no gallops or murmurs ?Respiratory with rales at the right base with no wheezing or rhonchi ?Abdomen not distended  ?No lower extremity edema  ?Data Reviewed: ? ? ? ?Family Communication: no family at the bedside  ? ?Disposition: ?Status is: Inpatient ?Remains inpatient appropriate  because: pending TEE  ? Planned Discharge Destination: Home ? ? ?\ ? ?Author: ?Coralie Keens, MD ?03/02/2022 4:27 PM ? ?For on call review [email or web address].  ?

## 2022-03-02 NOTE — Assessment & Plan Note (Signed)
Calculated BMI is 36.4 

## 2022-03-02 NOTE — Assessment & Plan Note (Addendum)
Continue with clonidine 0.5 bid, duloxetine, hydroxyzine, and quetiapine.

## 2022-03-02 NOTE — Progress Notes (Signed)
Mobility Specialist Progress Note ? ? 03/02/22 1738  ?Mobility  ?Activity Ambulated with assistance in hallway  ?Level of Assistance Standby assist, set-up cues, supervision of patient - no hands on  ?Assistive Device Four wheel walker  ?Distance Ambulated (ft) 470 ft  ?Activity Response Tolerated well  ?$Mobility charge 1 Mobility  ? ?Pre Mobility: 105 HR, 91% SpO2 on 5.5LO2 ?During Mobility: 123 HR, 87% SpO2 ?Post Mobility: 107 HR, 145/81 BP, 91% SpO2 ? ?Received in chair on 5.5LO2 w/ slight back and hip pain but agreeable. Ambulated w/o faults, breaks or symptoms. Returned back to chair w/ call bell in reach and chair alarm on.  ? ?Frederico Hamman ?Mobility Specialist ?Phone Number [phone] ? ?

## 2022-03-02 NOTE — Assessment & Plan Note (Addendum)
No clinical signs of exacerbation Continue bronchodilator therapy and oxymetry monitoring.

## 2022-03-02 NOTE — Assessment & Plan Note (Addendum)
Her glucose remained stable, she was placed on insulin sliding scale during her hospitalization with good toleration.  At the time of her discharge she will resume metformin, ozempic, and pioglitazone.

## 2022-03-02 NOTE — Assessment & Plan Note (Addendum)
Sepsis complicated with MRSA bacteremia/ severe sepsis end organ damage hypoxemic respiratory failure. (present on admission).   Patient had a prolonged hospitalization, she was placed on broad spectrum antibiotic therapy.  Transferred from [facility] to [facility] for further management.   05/11 right thoracentesis too organized to drain.   05//13/23 chest tune was placed, 14 french pigtail catheter and connected to suction -20 cm H20.  05/14 started with pleural lytics.   Cultures were no growth, but she had positive MRSA bacteremia in [facility], 2 weeks prior while being treated for pneumonia (she left AMA on that occasion).   Antibiotic therapy was narrowed to IV vancomycin Patient with high risk for creating a bronchopleural fistula and not candidate for decortications.   TEE negative for vegetations. Plan to transition to linezolid to complete therapy on 03/26/22.   Acute hypoxemic respiratory failure, that has been improving.  At home patient will continue using supplemental 02 per [place] and instructed to follow up as outpatient.

## 2022-03-03 ENCOUNTER — Inpatient Hospital Stay (HOSPITAL_COMMUNITY): Payer: 59 | Admitting: Anesthesiology

## 2022-03-03 ENCOUNTER — Inpatient Hospital Stay (HOSPITAL_COMMUNITY): Payer: 59

## 2022-03-03 ENCOUNTER — Encounter (HOSPITAL_COMMUNITY): Payer: Self-pay

## 2022-03-03 ENCOUNTER — Other Ambulatory Visit (HOSPITAL_COMMUNITY): Payer: Self-pay

## 2022-03-03 ENCOUNTER — Encounter (HOSPITAL_COMMUNITY)
Admission: EM | Disposition: A | Discharge status: Home / Self Care | Payer: Self-pay | Source: Home / Self Care | Attending: Internal Medicine

## 2022-03-03 DIAGNOSIS — R7881 Bacteremia: Secondary | ICD-10-CM

## 2022-03-03 DIAGNOSIS — J441 Chronic obstructive pulmonary disease with (acute) exacerbation: Secondary | ICD-10-CM

## 2022-03-03 DIAGNOSIS — E119 Type 2 diabetes mellitus without complications: Secondary | ICD-10-CM

## 2022-03-03 DIAGNOSIS — K219 Gastro-esophageal reflux disease without esophagitis: Secondary | ICD-10-CM | POA: Diagnosis not present

## 2022-03-03 DIAGNOSIS — I10 Essential (primary) hypertension: Secondary | ICD-10-CM | POA: Diagnosis not present

## 2022-03-03 DIAGNOSIS — J449 Chronic obstructive pulmonary disease, unspecified: Secondary | ICD-10-CM | POA: Diagnosis not present

## 2022-03-03 DIAGNOSIS — J869 Pyothorax without fistula: Secondary | ICD-10-CM | POA: Diagnosis not present

## 2022-03-03 DIAGNOSIS — Z7984 Long term (current) use of oral hypoglycemic drugs: Secondary | ICD-10-CM

## 2022-03-03 DIAGNOSIS — E876 Hypokalemia: Secondary | ICD-10-CM

## 2022-03-03 HISTORY — PX: TEE WITHOUT CARDIOVERSION: SHX5443

## 2022-03-03 LAB — GLUCOSE, CAPILLARY
Glucose-Capillary: 168 mg/dL — ABNORMAL HIGH (ref 70–99)
Glucose-Capillary: 131 mg/dL — ABNORMAL HIGH (ref 70–99)
Glucose-Capillary: 188 mg/dL — ABNORMAL HIGH (ref 70–99)
Glucose-Capillary: 183 mg/dL — ABNORMAL HIGH (ref 70–99)

## 2022-03-03 SURGERY — ECHOCARDIOGRAM, TRANSESOPHAGEAL
Anesthesia: General

## 2022-03-03 MED ORDER — ENOXAPARIN SODIUM 40 MG/0.4ML IJ SOSY
40.0000 mg | PREFILLED_SYRINGE | INTRAMUSCULAR | Status: DC
  Administered 2022-03-03: 40 mg via SUBCUTANEOUS
  Filled 2022-03-03: qty 0.4

## 2022-03-03 MED ORDER — LIDOCAINE 2% (20 MG/ML) 5 ML SYRINGE
INTRAMUSCULAR | Status: DC | PRN
  Administered 2022-03-03: 50 mg via INTRAVENOUS

## 2022-03-03 MED ORDER — PROPOFOL 10 MG/ML IV BOLUS
INTRAVENOUS | Status: DC | PRN
  Administered 2022-03-03: 40 mg via INTRAVENOUS

## 2022-03-03 MED ORDER — PROPOFOL 500 MG/50ML IV EMUL
INTRAVENOUS | Status: DC | PRN
  Administered 2022-03-03: 90 ug/kg/min via INTRAVENOUS

## 2022-03-03 MED ORDER — SODIUM CHLORIDE 0.9 % IV SOLN: INTRAVENOUS | Status: DC

## 2022-03-03 NOTE — Transfer of Care (Signed)
Immediate Anesthesia Transfer of Care Note  Patient: Beverly Tran  Procedure(s) Performed: TRANSESOPHAGEAL ECHOCARDIOGRAM (TEE)  Patient Location: Endoscopy Unit  Anesthesia Type:MAC  Level of Consciousness: awake and alert   Airway & Oxygen Therapy: Patient Spontanous Breathing and Patient connected to nasal cannula oxygen  Post-op Assessment: Report given to RN and Post -op Vital signs reviewed and stable  Post vital signs: Reviewed and stable  Last Vitals:  Vitals Value Taken Time  BP 120/56 03/03/22 1315  Temp    Pulse 105 03/03/22 1316  Resp 20 03/03/22 1316  SpO2 98 % 03/03/22 1316  Vitals shown include unvalidated device data.  Last Pain:  Vitals:   03/03/22 1143  TempSrc: Temporal  PainSc: 0-No pain      Patients Stated Pain Goal: 0 (41/93/79 0240)  Complications: No notable events documented.

## 2022-03-03 NOTE — Progress Notes (Addendum)
  Echocardiogram Echocardiogram transesophagael has been performed.  Beverly Tran 03/03/2022, 2:04 PM

## 2022-03-03 NOTE — Anesthesia Procedure Notes (Signed)
Procedure Name: MAC Date/Time: 03/03/2022 12:59 PM Performed by: Lorie Phenix, CRNA Pre-anesthesia Checklist: Patient identified, Emergency Drugs available, Suction available and Patient being monitored Patient Re-evaluated:Patient Re-evaluated prior to induction Oxygen Delivery Method: Simple face mask Placement Confirmation: positive ETCO2

## 2022-03-03 NOTE — Interval H&P Note (Signed)
History and Physical Interval Note:  03/03/2022 12:12 PM  Beverly Tran  has presented today for surgery, with the diagnosis of BACTEREMIA.  The various methods of treatment have been discussed with the patient and family. After consideration of risks, benefits and other options for treatment, the patient has consented to  Procedure(s): TRANSESOPHAGEAL ECHOCARDIOGRAM (TEE) (N/A) as a surgical intervention.  The patient's history has been reviewed, patient examined, no change in status, stable for surgery.  I have reviewed the patient's chart and labs.  Questions were answered to the patient's satisfaction.     Kirk Ruths

## 2022-03-03 NOTE — Progress Notes (Signed)
Progress Note   Patient: Beverly Tran JYN:[id number] DOB: January 30, 1971 DOA: 02/24/2022     7 DOS: the patient was seen and examined on 03/03/2022   Brief hospital course: Beverly Tran was admitted to the hospital with the working diagnosis of sepsis due to pneumonia, complicated with empyema.   51 yo female with the past medical history of COPD, T2DM, hypertension and obesity class 2 who presented with dyspnea. Initially admitted to community acquired pneumonia on 02/16/22, she had high oxygen requirements, including non invasive mechanical ventilation. Her blood cultures were positive for MRSA. On 02/23/22 she left the hospital against medical advice. She return the following day with worsening dyspnea. At home 02 saturation was 77% on 6 L/min of supplemental 02 per [place]. She was in respiratory distress and placed back on Bipap, her blood pressure was 119/100, HR 101, RR 22, temp 101.3 and 02 saturation 86%, lungs with no wheezing, heart with S1 and S2 present and rhythmic, abdomen not distended and positive lower extremity edema.   Chest radiograph with right pleural effusion and bilateral lower lobes interstitial infiltrates.  CT chest with no pulmonary embolism, worsening multifocal pneumonia with ground glass opacities of the left lower lobe and signs of necrotic pneumonia on the right lower lobe.  Small left pleural effusion.   Patient was placed on broad spectrum antibiotic and pulmonary was consulted.  She was transferred from [facility] to [facility], for further management.  Chest tube was placed to the right pleural effusion and received fibrinolytic therapy with good toleration. 05/15 chest tube was removed.   Plan for further work up with TEE for MRSA bacteremia.     Assessment and Plan: Empyema (HCC) Sepsis complicated with MRSA bacteremia.  Patient sp chest tube drainage of empyema, present on admission. Chest pain has been improving.   Plan has been on antibiotic therapy with IV  vancomycin Patient with high risk for creating a bronchopleural fistula and not candidate for decortications.   TEE negative for vegetations. Plan to transition to linezolid to complete therapy on 03/26/22.   Acute hypoxemic respiratory failure At home patient on 5 to 6 L/min per [place].  Continue to wean to home flow as tolerated,    COPD (chronic obstructive pulmonary disease) (HCC) No clinical signs of exacerbation Continue bronchodilator therapy and oxymetry monitoring Out of bed to chair tid with meals, and encourage mobility.   Type 2 diabetes mellitus with hyperlipidemia (HCC) Continue glucose cover and monitoring with insulin sliding scale. Patient is tolerating po well.   Continue with statin therapy.   Essential hypertension Continue to hold on blood pressure medications.  Her blood pressure has been controlled.   Gastroesophageal reflux disease without esophagitis Continue with antiacid therapy.   Anxiety Will resume clonidine 0.5 bid   Hypokalemia Renal function stable with serum cr at 0,38 K is 4,3 and serum bicarbonate at 32.   Iron deficiency anemia Continue with iron supplementation   Class 2 obesity Calculated BMI is 36.4         Subjective: Patient is feeling better, chest pain continue to improve and along with dyspnea   Physical Exam: Vitals:   03/03/22 1335 03/03/22 1414 03/03/22 1432 03/03/22 1700  BP: (!) 134/53 116/74  127/86  Pulse: 98 (!) 106  (!) 107  Resp: (!) 21 20  20   Temp:  99.1 F (37.3 C)  99.1 F (37.3 C)  TempSrc:  Oral  Oral  SpO2: 99% 95% 94% 98%  Weight:  Height:       Neurology awake and alert ENT with mild pallor Cardiovascular with S1 and S2 present and rhythmic Respiratory with mild rales at the right base Abdomen not distended No lower extremity edema  Data Reviewed:    Family Communication: no family at the bedside   Disposition: Status is: Inpatient Remains inpatient appropriate because:  possible dc home tomorrow if improvement in oxygenation   Planned Discharge Destination: Home     Author: Coralie Keens, MD 03/03/2022 5:58 PM  For on call review [email or web address].

## 2022-03-03 NOTE — Progress Notes (Signed)
    Transesophageal Echocardiogram Note  Beverly Tran IV:[id number] 01-25-1971  Procedure: Transesophageal Echocardiogram Indications: Bacteremia  Procedure Details Consent: Obtained Time Out: Verified patient identification, verified procedure, site/side was marked, verified correct patient position, special equipment/implants available, Radiology Safety Procedures followed,  medications/allergies/relevent history reviewed, required imaging and test results available.  Performed  Medications:  Pt sedated by anesthesia with diprovan 110 mg IV.  Normal LV function; trace AI, MR and TR; no vegetations.   Complications: No apparent complications Patient did tolerate procedure well.  Kirk Ruths, MD

## 2022-03-03 NOTE — TOC Benefit Eligibility Note (Signed)
Patient Product/process development scientist completed.    The patient is currently admitted and upon discharge could be taking linezolid (Zyvox) 600 mg tablets.  The current 28 day co-pay is, $50.00.   The patient is insured through [employer]     Roland Earl, CPhT Pharmacy Patient Advocate Specialist [facility] Patient Advocate Team Direct Number: [phone]  Fax: [phone]

## 2022-03-03 NOTE — Progress Notes (Signed)
ID Brief note  Remains afebrile,  TEE with no vegetations or concerns for endocarditis  On 7 L [facility]   All cultures finalized with no new updates  OK to switch to linezolid when ready to be discharged for MRSA bacteremia complicated by empyema  Patient is on Duloxetine 60mg  PO daily. Low concerns for serotonin syndrome with linezolid. Discussed with ID pharmacy Duration 4 weeks from 5/13. EOT 03/26/22 Will follow in the clinic in 2 weeks Fu with pulmonary as instructed for repeat imaging ID will sign off. Please call with questions D/w Primary   05/26/22, MD Infectious Disease Physician [facility] [address] [place], [place] Phone: [phone]  Fax: [phone]

## 2022-03-04 ENCOUNTER — Encounter (HOSPITAL_COMMUNITY): Payer: Self-pay | Admitting: Cardiology

## 2022-03-04 ENCOUNTER — Other Ambulatory Visit (HOSPITAL_COMMUNITY): Payer: Self-pay

## 2022-03-04 DIAGNOSIS — E1169 Type 2 diabetes mellitus with other specified complication: Secondary | ICD-10-CM | POA: Diagnosis not present

## 2022-03-04 DIAGNOSIS — J449 Chronic obstructive pulmonary disease, unspecified: Secondary | ICD-10-CM | POA: Diagnosis not present

## 2022-03-04 DIAGNOSIS — J869 Pyothorax without fistula: Secondary | ICD-10-CM | POA: Diagnosis not present

## 2022-03-04 DIAGNOSIS — I10 Essential (primary) hypertension: Secondary | ICD-10-CM | POA: Diagnosis not present

## 2022-03-04 LAB — GLUCOSE, CAPILLARY
Glucose-Capillary: 190 mg/dL — ABNORMAL HIGH (ref 70–99)
Glucose-Capillary: 317 mg/dL — ABNORMAL HIGH (ref 70–99)

## 2022-03-04 LAB — CBC
HCT: 34.9 % — ABNORMAL LOW (ref 36.0–46.0)
Hemoglobin: 10.8 g/dL — ABNORMAL LOW (ref 12.0–15.0)
MCH: 30.9 pg (ref 26.0–34.0)
MCHC: 30.9 g/dL (ref 30.0–36.0)
MCV: 100 fL (ref 80.0–100.0)
Platelets: 485 10*3/uL — ABNORMAL HIGH (ref 150–400)
RBC: 3.49 MIL/uL — ABNORMAL LOW (ref 3.87–5.11)
RDW: 16.9 % — ABNORMAL HIGH (ref 11.5–15.5)
WBC: 8.4 10*3/uL (ref 4.0–10.5)
nRBC: 0.4 % — ABNORMAL HIGH (ref 0.0–0.2)

## 2022-03-04 LAB — BASIC METABOLIC PANEL
Anion gap: 8 (ref 5–15)
BUN: 8 mg/dL (ref 6–20)
CO2: 32 mmol/L (ref 22–32)
Calcium: 9.1 mg/dL (ref 8.9–10.3)
Chloride: 96 mmol/L — ABNORMAL LOW (ref 98–111)
Creatinine, Ser: 0.41 mg/dL — ABNORMAL LOW (ref 0.44–1.00)
GFR, Estimated: >60 mL/min (ref >60)
Glucose, Bld: 187 mg/dL — ABNORMAL HIGH (ref 70–99)
Potassium: 3.9 mmol/L (ref 3.5–5.1)
Sodium: 136 mmol/L (ref 135–145)

## 2022-03-04 MED ORDER — LINEZOLID 600 MG PO TABS
600.0000 mg | ORAL_TABLET | Freq: Two times a day (BID) | ORAL | 0 refills | Status: DC
  Filled 2022-03-04: qty 44, 22d supply, fill #0

## 2022-03-04 MED ORDER — PROPRANOLOL HCL 10 MG PO TABS
10.0000 mg | ORAL_TABLET | Freq: Once | ORAL | Status: AC
  Administered 2022-03-04: 10 mg via ORAL
  Filled 2022-03-04: qty 1

## 2022-03-04 MED ORDER — LINEZOLID 600 MG PO TABS
600.0000 mg | ORAL_TABLET | Freq: Two times a day (BID) | ORAL | Status: DC
  Administered 2022-03-04: 600 mg via ORAL
  Filled 2022-03-04 (×2): qty 1

## 2022-03-04 MED ORDER — FUROSEMIDE 40 MG PO TABS: 40.0000 mg | ORAL_TABLET | Freq: Every day | ORAL | Status: DC | PRN

## 2022-03-04 MED ORDER — VITAMIN D3 10 MCG (400 UNIT) PO CAPS: 400.0000 [IU] | ORAL_CAPSULE | Freq: Every day | ORAL | Status: DC

## 2022-03-04 NOTE — Anesthesia Preprocedure Evaluation (Signed)
Anesthesia Evaluation  Patient identified by MRN, date of birth, ID band Patient awake    Reviewed: Allergy & Precautions, NPO status , Patient's Chart, lab work & pertinent test results, reviewed documented beta blocker date and time   Airway Mallampati: II       Dental  (+) Poor Dentition   Pulmonary former smoker,    Pulmonary exam normal        Cardiovascular hypertension, Pt. on medications and Pt. on home beta blockers Normal cardiovascular exam     Neuro/Psych  Headaches, PSYCHIATRIC DISORDERS Anxiety Depression    GI/Hepatic GERD  Medicated and Controlled,  Endo/Other  diabetes, Type 2, Oral Hypoglycemic Agents  Renal/GU      Musculoskeletal   Abdominal (+) + obese,   Peds  Hematology  (+) Blood dyscrasia, anemia ,   Anesthesia Other Findings   Reproductive/Obstetrics                             Anesthesia Physical Anesthesia Plan  ASA: 3  Anesthesia Plan: MAC   Post-op Pain Management:    Induction:   PONV Risk Score and Plan: Propofol infusion and TIVA  Airway Management Planned: Natural Airway and Mask  Additional Equipment: TEE  Intra-op Plan:   Post-operative Plan:   Informed Consent: I have reviewed the patients History and Physical, chart, labs and discussed the procedure including the risks, benefits and alternatives for the proposed anesthesia with the patient or authorized representative who has indicated his/her understanding and acceptance.     Dental advisory given  Plan Discussed with: CRNA  Anesthesia Plan Comments:         Anesthesia Quick Evaluation

## 2022-03-04 NOTE — TOC Transition Note (Signed)
Transition of Care (TOC) - CM/SW Discharge Note Donn Pierini RN, BSN Transitions of Care Unit 4E- RN Case Manager See Treatment Team for direct phone #    Patient Details  Name: Beverly Tran MRN: [id number] Date of Birth: [birth year]  Transition of Care [facility]) CM/SW Contact:  Darrold Span, RN Phone Number: 03/04/2022, 12:16 PM   Clinical Narrative:    Patient will DC to: Home Transport by:  family PCP: [facility]  Pt stable for transition home today, pt has baseline home 02. Per pt does not have difficulty affording meds usually. Pt to go home on Zyvox- per benefits check- copay $50.  Pt does have transportation home.   No TOC needs noted for transition home   Final next level of care: Home/Self Care Barriers to Discharge: Barriers Resolved   Patient Goals and CMS Choice Patient states their goals for this hospitalization and ongoing recovery are:: return home   Choice offered to / list presented to : NA  Discharge Placement                 Home      Discharge Plan and Services                DME Arranged: N/A DME Agency: NA         HH Agency: NA        Social Determinants of Health (SDOH) Interventions     Readmission Risk Interventions    03/04/2022   12:16 PM  Readmission Risk Prevention Plan  Transportation Screening Complete  PCP or Specialist Appt within 3-5 Days Complete  HRI or Home Care Consult Complete  Social Work Consult for Recovery Care Planning/Counseling Complete  Palliative Care Screening Not Applicable  Medication Review Oceanographer) Complete

## 2022-03-04 NOTE — Discharge Summary (Signed)
Physician Discharge Summary   Patient: Beverly Tran MRN: [id number] DOB: [birth year]  Admit date:     02/24/2022  Discharge date: 03/04/22  Discharge Physician: Tawni Millers   PCP: [facility]   Recommendations at discharge:    Continue antibiotic therapy with linezolid to complete 4 weeks from 02/26/22, until 03/26/22. Hold on diuretic therapy Continue supplemental 02 per [place] at home.  Follow up as outpatient in 7 to 10 days with primary care.  Follow up with pulmonary to repeat chest imaging.   Discharge Diagnoses: Active Problems:   Empyema (HCC)   COPD (chronic obstructive pulmonary disease) (HCC)   Type 2 diabetes mellitus with hyperlipidemia (HCC)   Essential hypertension   Gastroesophageal reflux disease without esophagitis   Anxiety   Hypokalemia   Iron deficiency anemia   Class 2 obesity  Resolved Problems:   * No resolved hospital problems. [facility] Course: Beverly Tran was admitted to the hospital with the working diagnosis of sepsis due to pneumonia, complicated with empyema.   51 yo female with the past medical history of COPD, T2DM, hypertension and obesity class 2 who presented with dyspnea. Initially admitted to community acquired pneumonia on 02/16/22, she had high oxygen requirements, and required non invasive mechanical ventilation. Her blood cultures were positive for MRSA. On 02/23/22 she left the hospital against medical advice. She return the following day with worsening dyspnea. At home 02 saturation was 77% on 6 L/min of supplemental 02 per [place]. She was in respiratory distress and placed back on Bipap, her blood pressure was 119/100, HR 101, RR 22, temp 101.3 and 02 saturation 86%, lungs with no wheezing, positive rales at the rifht side with decreased breath sounds, heart with S1 and S2 present and rhythmic, abdomen not distended and positive lower extremity edema.   Chest radiograph with right pleural effusion  and bilateral lower lobes interstitial infiltrates.  CT chest with no pulmonary embolism, worsening multifocal pneumonia with ground glass opacities of the left lower lobe and signs of necrotic pneumonia on the right lower lobe. Moderate loculated right pleural effusion.  Small left pleural effusion.   Patient was placed on broad spectrum antibiotic and pulmonary was consulted.  She was transferred from [facility] to [facility], for further management.  Chest tube was placed to the right pleural effusion and received fibrinolytic therapy with good toleration. 05/15 chest tube was removed.   Further work up with TEE was negative for vegetations, and antibiotic therapy was changed from IV vancomycin to oral linezolid to continue until 03/26/22, to complete 4 weeks from 02/26/22.   Oxygen requirements have improved, and patient will follow up as outpatient.      Assessment and Plan: Empyema ([place]) Sepsis complicated with MRSA bacteremia/ severe sepsis end organ damage hypoxemic respiratory failure. (present on admission).   Patient had a prolonged hospitalization, she was placed on broad spectrum antibiotic therapy.  Transferred from [facility] to [facility] for further management.   05/11 right thoracentesis too organized to drain.   05//13/23 chest tune was placed, 14 french pigtail catheter and connected to suction -20 cm H20.  05/14 started with pleural lytics.   Cultures were no growth, but she had positive MRSA bacteremia in [facility], 2 weeks prior while being treated for pneumonia (she left AMA on that occasion).   Antibiotic therapy was narrowed to IV vancomycin Patient with high risk for creating a bronchopleural fistula and not candidate for decortications.   TEE negative for vegetations.  Plan to transition to linezolid to complete therapy on 03/26/22.   Acute hypoxemic respiratory failure, that has been improving.  At home patient will continue using supplemental 02 per [place] and instructed to  follow up as outpatient.     COPD (chronic obstructive pulmonary disease) (HCC) No clinical signs of exacerbation Continue bronchodilator therapy and oxymetry monitoring.   Type 2 diabetes mellitus with hyperlipidemia (HCC) Her glucose remained stable, she was placed on insulin sliding scale during her hospitalization with good toleration.  At the time of her discharge she will resume metformin, and pioglitazone.   Essential hypertension Blood pressure has been stable. At the time of her discharge she will resume irbesartan, propanolol and amlodipine.  Echocardiogram with preserved LV systolic function, will hold on furosemide for now, instructions for close follow up as outpatient.   Gastroesophageal reflux disease without esophagitis Continue with antiacid therapy.   Anxiety Continue with clonidine 0.5 bid   Hypokalemia Electrolytes were corrected, at the time of her discharge her renal function is stable with serum cr at 0.41, K is 3,9 and serum bicarbonate at 32.   Plan to hold on diuretic therapy and follow up renal function as outpatient.   Iron deficiency anemia Continue with iron supplementation   Class 2 obesity Calculated BMI is 36.4          Consultants: cardiology (TEE), ID, Pulmonary  Procedures performed: chest tube placement on the right   Disposition: Home Diet recommendation:  Cardiac and Carb modified diet DISCHARGE MEDICATION: Allergies as of 03/04/2022       Reactions   Diclofenac Anaphylaxis   Salami [pickled Meat] Shortness Of Breath, Swelling   Also Allergic to POPCORN: same reaction   Aspirin Other (See Comments)   Nose bleeds   Bee Venom Swelling   WASP/HORNET   Compazine [prochlorperazine] Hives   Imitrex [sumatriptan] Hives   Lamotrigine Other (See Comments)   confusion   Nubain [nalbuphine Hcl] Swelling, Other (See Comments)   Eye swelling   Thorazine [chlorpromazine] Hives   Topamax [topiramate] Other (See Comments)    Confusion and out of it   Voltaren [diclofenac Sodium] Swelling   Zofran [ondansetron Hcl] Nausea Only        Medication List     STOP taking these medications    colestipol 1 g tablet Commonly known as: COLESTID   Jardiance 10 MG Tabs tablet Generic drug: empagliflozin   potassium chloride 10 MEQ tablet Commonly known as: KLOR-CON M   predniSONE 10 MG tablet Commonly known as: DELTASONE       TAKE these medications    amLODipine 10 MG tablet Commonly known as: NORVASC Take 1 tablet (10 mg total) by mouth daily.   clonazePAM 0.5 MG tablet Commonly known as: KLONOPIN Take 0.5 mg by mouth 2 (two) times daily as needed.   DULoxetine 60 MG capsule Commonly known as: CYMBALTA Take 60 mg by mouth daily.   EPINEPHrine 0.3 mg/0.3 mL Soaj injection Commonly known as: EPI-PEN Inject 0.3 mg into the muscle as directed.   famotidine 20 MG tablet Commonly known as: PEPCID TAKE ONE TABLET BY MOUTH AFTER SUPPER.   FeroSul 325 (65 FE) MG tablet Generic drug: ferrous sulfate Take 325 mg by mouth 2 (two) times daily.   furosemide 40 MG tablet Commonly known as: LASIX Take 1 tablet (40 mg total) by mouth daily as needed for fluid or edema (as needed for leg swelling or worsening shortnes of breath.). What changed:  when to take  this reasons to take this   gabapentin 600 MG tablet Commonly known as: NEURONTIN Take 600 mg by mouth 3 (three) times daily.   hydrOXYzine 25 MG tablet Commonly known as: ATARAX Take 25 mg by mouth daily as needed for anxiety.   ipratropium-albuterol 0.5-2.5 (3) MG/3ML Soln Commonly known as: DUONEB Take 3 mLs by nebulization every 4 (four) hours as needed (shortness of breath or wheezing).   irbesartan 150 MG tablet Commonly known as: AVAPRO Take 1 tablet (150 mg total) by mouth daily.   levocetirizine 5 MG tablet Commonly known as: XYZAL Take 10 mg by mouth at bedtime.   linezolid 600 MG tablet Commonly known as: ZYVOX Take 1  tablet (600 mg total) by mouth 2 (two) times daily for 22 days. Last day of therapy : 03/26/22   meclizine 25 MG tablet Commonly known as: ANTIVERT Take 25 mg by mouth 2 (two) times daily as needed for nausea.   metFORMIN 500 MG tablet Commonly known as: GLUCOPHAGE Take 500-1,000 mg by mouth See admin instructions. Take 1000 mg by mouth in the morning and 500 mg in the evening   montelukast 10 MG tablet Commonly known as: SINGULAIR Take 10 mg by mouth daily with supper.   naloxone 4 MG/0.1ML Liqd nasal spray kit Commonly known as: NARCAN 1 spray once.   oxyCODONE-acetaminophen 10-325 MG tablet Commonly known as: PERCOCET Take 1 tablet by mouth 3 (three) times daily as needed for moderate pain.   Ozempic (1 MG/DOSE) 4 MG/3ML Sopn Generic drug: Semaglutide (1 MG/DOSE) Inject 1 mg into the skin every 7 (seven) days.   pantoprazole 40 MG tablet Commonly known as: PROTONIX TAKE ONE TABLET BY MOUTH DAILY (MORNING) What changed: See the new instructions.   pioglitazone 45 MG tablet Commonly known as: ACTOS Take 45 mg by mouth daily.   pramipexole 1 MG tablet Commonly known as: MIRAPEX Take 1 mg by mouth at bedtime.   promethazine 25 MG tablet Commonly known as: PHENERGAN TAKE ONE TABLET BY MOUTH EVERY 8 HOURS AS NEEDED FOR NAUSEA AND VOMITING   propranolol 10 MG tablet Commonly known as: INDERAL Take 10 mg by mouth at bedtime.   QUEtiapine 50 MG tablet Commonly known as: SEROQUEL Take 100 mg by mouth at bedtime.   rosuvastatin 10 MG tablet Commonly known as: CRESTOR Take 10 mg by mouth at bedtime.   Symbicort 80-4.5 MCG/ACT inhaler Generic drug: budesonide-formoterol Inhale 2 puffs into the lungs daily.   Vitamin D3 10 MCG (400 UNIT) Caps Take 400 Units by mouth daily. What changed: how much to take        Discharge Exam: Filed Weights   02/24/22 1146 02/24/22 1445  Weight: 103.1 kg 99.3 kg   BP 133/89 (BP Location: Left Wrist)   Pulse (!) 108   Temp  98.3 F (36.8 C) (Oral)   Resp 18   Ht _0  (1.651 m)   Wt 99.3 kg   LMP 10/13/2015   SpO2 100%   BMI 36.43 kg/m    Condition at discharge: stable  The results of significant diagnostics from this hospitalization (including imaging, microbiology, ancillary and laboratory) are listed below for reference.   Imaging Studies: CT CHEST WO CONTRAST  Result Date: 02/28/2022 CLINICAL DATA:  Empyema. EXAM: CT CHEST WITHOUT CONTRAST TECHNIQUE: Multidetector CT imaging of the chest was performed following the standard protocol without IV contrast. RADIATION DOSE REDUCTION: This exam was performed according to the departmental dose-optimization program which includes automated exposure control, adjustment  of the mA and/or kV according to patient size and/or use of iterative reconstruction technique. COMPARISON:  CTA chest 02/25/2022 at [place]: Cardiovascular: Heart is mildly enlarged. Small pericardial effusion is noted. Mediastinum/Nodes: No enlarged mediastinal or axillary lymph nodes. Thyroid gland, trachea, and esophagus demonstrate no significant findings. Lungs/Pleura: A pigtail catheter drain is present in the right pleural space. The lateral and superior components the empyema have been drained. The inferior and medial component remains. There is some fluid inferior and lateral as well. Airspace consolidation in the right lower lobe has progressed with partial collapse of the right lower lobe. A left pleural effusion is similar the prior study with mild dependent atelectasis. Minimal airspace disease in the lingula is stable. The upper lung fields are otherwise clear. Upper Abdomen: Unremarkable Musculoskeletal: No chest wall mass or suspicious bone lesions identified. IMPRESSION: 1. Interval drainage of superior and lateral components of a right-sided empyema. 2. Persistent inferolateral and inferomedial components of the empyema suggest loculation 3. Progressive airspace  consolidation in the right lower lobe with partial collapse of the right lower lobe. 4. Stable left pleural effusion and mild dependent atelectasis. Electronically Signed   By: San Morelle M.D.   On: 02/28/2022 10:18   CT Angio Chest Pulmonary Embolism (PE) W or WO Contrast  Result Date: 02/25/2022 CLINICAL DATA:  Shortness of breath EXAM: CT ANGIOGRAPHY CHEST WITH CONTRAST TECHNIQUE: Multidetector CT imaging of the chest was performed using the standard protocol during bolus administration of intravenous contrast. Multiplanar CT image reconstructions and MIPs were obtained to evaluate the vascular anatomy. RADIATION DOSE REDUCTION: This exam was performed according to the departmental dose-optimization program which includes automated exposure control, adjustment of the mA and/or kV according to patient size and/or use of iterative reconstruction technique. CONTRAST:  64m OMNIPAQUE IOHEXOL 350 MG/ML SOLN COMPARISON:  CT chest angio dated Feb 16, 2022 FINDINGS: Cardiovascular: Adequate contrast opacification of the pulmonary arteries. No evidence of pulmonary embolus. Normal heart size. Trace pericardial fluid. Normal caliber thoracic aorta with mild atherosclerotic disease. Mediastinum/Nodes: Patulous esophagus. Thyroid is unremarkable. Enlarged mediastinal and bilateral hilar lymph nodes. Reference right lower paratracheal lymph node measuring 1.5 cm in short axis, unchanged when compared to the prior exam. Prominent right supraclavicular lymph node measuring 8 mm on series 4, image 12, stable. Lungs/Pleura: Central airways are patent. Moderate loculated right pleural effusion and small left pleural effusion. Right basilar consolidation with scattered areas of low attenuation. New ground-glass opacity of the left lower lobe. Similar left basilar and lingular atelectasis. Upper Abdomen: No acute abnormality. Musculoskeletal: No chest wall abnormality. No acute or significant osseous findings. Review of  the MIP images confirms the above findings. IMPRESSION: 1. No evidence of pulmonary embolus. 2. Worsening multifocal pneumonia with new ground-glass opacities of the left lower lobe and new areas of low attenuation seen in the right basilar consolidation which are concerning for necrotic pneumonia. 3. New moderate loculated right pleural effusion, concerning for empyema. 4. New small left pleural effusion. Electronically Signed   By: LYetta GlassmanM.D.   On: 02/25/2022 10:12   CT Angio Chest PE W and/or Wo Contrast  Result Date: 02/16/2022 CLINICAL DATA:  Pulmonary embolism (PE) suspected, high prob mulitfocal pneumonia vs pe vs pulmonary edema. EXAM: CT ANGIOGRAPHY CHEST WITH CONTRAST TECHNIQUE: Multidetector CT imaging of the chest was performed using the standard protocol during bolus administration of intravenous contrast. Multiplanar CT image reconstructions and MIPs were obtained to evaluate the vascular anatomy. RADIATION DOSE REDUCTION:  This exam was performed according to the departmental dose-optimization program which includes automated exposure control, adjustment of the mA and/or kV according to patient size and/or use of iterative reconstruction technique. CONTRAST:  125m OMNIPAQUE IOHEXOL 350 MG/ML SOLN COMPARISON:  None Available. FINDINGS: Cardiovascular: Satisfactory opacification of the pulmonary arteries to the segmental level. No evidence of pulmonary embolism. Normal cardiac size.No pericardial disease. The thoracic aorta is unremarkable. Mediastinum/Nodes: Prominent mediastinal and hilar lymph nodes, likely reactive. The thyroid is unremarkable. Esophagus is unremarkable. Lungs/Pleura: There is confluent airspace consolidation in the right lower lobe and posterior right upper lobe. There is multifocal airspace consolidation in the left lower lobe and lingula. Central airways are clear. No pleural effusion. No pneumothorax. Upper Abdomen: Hepatic steatosis.  No acute abnormality.  Musculoskeletal: No chest wall abnormality. No acute or significant osseous findings. Review of the MIP images confirms the above findings. IMPRESSION: Severe bilateral multifocal pneumonia, most confluent in the right lower lobe. Recommend CT follow-up to resolution. No evidence of pulmonary embolism. Electronically Signed   By: JMaurine SimmeringM.D.   On: 02/16/2022 21:45   [place]CHEST (PLEURAL EFFUSION)  Result Date: 02/24/2022 CLINICAL DATA:  Abnormal chest radiograph with RIGHT pleural effusion, shortness of breath EXAM: CHEST ULTRASOUND COMPARISON:  Chest radiograph 02/24/2022 FINDINGS: Only a small RIGHT pleural effusion is identified, insufficient for thoracentesis. Atelectatic versus consolidated is seen floating within the small pleural effusion. IMPRESSION: Small RIGHT pleural effusion, insufficient for thoracentesis. Electronically Signed   By: MLavonia DanaM.D.   On: 02/24/2022 15:45   DG Chest Port 1 View  Result Date: 03/01/2022 CLINICAL DATA:  SOB (shortness of breath) cough EXAM: PORTABLE CHEST - 1 VIEW COMPARISON:  02/27/2022 FINDINGS: Interval removal of right chest tube. No pneumothorax. Persistent patchy airspace opacities at the lung bases, right worse than left. Persistent small pleural effusions bilaterally. Heart size and mediastinal contours are within normal limits. Visualized bones unremarkable. IMPRESSION: 1. Right chest tube removal with no pneumothorax. 2. Persistent small pleural effusions and patchy bibasilar airspace disease, right greater than left. Electronically Signed   By: DLucrezia EuropeM.D.   On: 03/01/2022 08:47   DG CHEST PORT 1 VIEW  Result Date: 02/27/2022 CLINICAL DATA:  Chest pain and shortness of breath. EXAM: PORTABLE CHEST 1 VIEW COMPARISON:  02/26/2022 FINDINGS: Right pleural drain again noted without evidence for pneumothorax. Right base collapse/consolidation with effusion is similar to prior. Retrocardiac collapse/consolidation is stable. The cardio pericardial  silhouette is enlarged. Telemetry leads overlie the chest. IMPRESSION: No substantial interval change in exam. Electronically Signed   By: EMisty StanleyM.D.   On: 02/27/2022 08:45   DG CHEST PORT 1 VIEW  Result Date: 02/26/2022 CLINICAL DATA:  Shortness of breath, pleural effusion EXAM: PORTABLE CHEST 1 VIEW COMPARISON:  Previous studies including chest radiograph done on 02/24/2022, CT done on 02/25/2022 FINDINGS: There is interval placement of right chest tube. Moderate right pleural effusion is seen. Small left pleural effusion is seen. Transverse diameter of heart is slightly increased. There are patchy infiltrates in the right upper lung fields, both parahilar regions and both lower lung fields suggesting possible multifocal pneumonia. There is no pneumothorax. IMPRESSION: Interval placement of right chest tube. There is interval decrease in amount of right pleural effusion. There is no pneumothorax. Small left pleural effusion. Infiltrates in both lungs have not changed significantly. Electronically Signed   By: PElmer PickerM.D.   On: 02/26/2022 11:09   DG Chest Port 1 View  Result Date:  02/24/2022 CLINICAL DATA:  Shortness of breath EXAM: PORTABLE CHEST 1 VIEW COMPARISON:  Previous studies including the examination of 02/22/2022 FINDINGS: Transverse diameter of heart is increased. Central pulmonary vessels are more prominent. There is moderate right pleural effusion with interval increase. There is blunting of left lateral CP angle. There is increased density in the left parahilar region and left lower lung fields. There is no pneumothorax. IMPRESSION: There is interval increase in pulmonary vascular congestion suggesting CHF. There is moderate right pleural effusion with interval increase. Small left pleural effusion. Increased density in both lower lung fields may suggest underlying atelectasis/pneumonia. Electronically Signed   By: Elmer Picker M.D.   On: 02/24/2022 12:50   DG  Chest Port 1 View  Result Date: 02/22/2022 CLINICAL DATA:  Short of breath EXAM: PORTABLE CHEST 1 VIEW COMPARISON:  02/20/2022 FINDINGS: Improved aeration in the lung bases. Small right pleural effusion. Negative for edema. Progression of left lower lobe airspace disease with horizontal linear component. Small left effusion IMPRESSION: Improved aeration in the lung bases.  Small right effusion. Mild progression of left lower lobe airspace disease and small left effusion. Electronically Signed   By: Franchot Gallo M.D.   On: 02/22/2022 09:06   DG CHEST PORT 1 VIEW  Result Date: 02/20/2022 CLINICAL DATA:  Dyspnea. EXAM: PORTABLE CHEST 1 VIEW COMPARISON:  Chest x-ray 02/16/2022. FINDINGS: There are increasing airspace opacities in the right lung base. Multifocal airspace opacities in the left mid and lower lung have slightly decreased. There is no pleural effusion or pneumothorax. The heart is enlarged, unchanged. No acute fractures are seen. IMPRESSION: 1. Shifting bilateral airspace disease worrisome for infection. 2. Stable cardiomegaly. Electronically Signed   By: Ronney Asters M.D.   On: 02/20/2022 03:48   DG Chest Port 1 View  Result Date: 02/16/2022 CLINICAL DATA:  Question sepsis EXAM: PORTABLE CHEST 1 VIEW COMPARISON:  Portable chest 02/15/2022 FINDINGS: Progressive airspace disease left lower lobe, probable pneumonia. Significant improvement in airspace disease in the right lung with small amount of residual right lower lobe infiltrate and small right effusion. Pulmonary vascularity normal. IMPRESSION: Progression of left lower lobe infiltrate probable pneumonia Significant improvement in right lung infiltrate. Electronically Signed   By: Franchot Gallo M.D.   On: 02/16/2022 18:22   ECHOCARDIOGRAM COMPLETE  Result Date: 02/17/2022    ECHOCARDIOGRAM REPORT   Patient Name:   WILDA WETHERELL Date of Exam: 02/17/2022 Medical Rec #:  [id number]           Height:       63.0 in Accession #:    [id number]           Weight:       226.9 lb Date of Birth:  Jun 23, 1971           BSA:          2.040 m Patient Age:    70 years            BP:           127/77 mmHg Patient Gender: F                   HR:           106 bpm. Exam Location:  [facility] Procedure: 2D Echo, Cardiac Doppler and Color Doppler Indications:    CHF  History:        Patient has no prior history of Echocardiogram examinations.  COPD; Risk Factors:Hypertension, Former Smoker and Dyslipidemia.  Sonographer:    Leavy Cella RDCS Referring Phys: [id number] OLADAPO ADEFESO IMPRESSIONS  1. Left ventricular ejection fraction, by estimation, is 60 to 65%. The left ventricle has normal function. The left ventricle has no regional wall motion abnormalities. Left ventricular diastolic parameters are indeterminate.  2. Right ventricular systolic function is normal. The right ventricular size is normal. Tricuspid regurgitation signal is inadequate for assessing PA pressure.  3. The mitral valve is normal in structure. No evidence of mitral valve regurgitation. No evidence of mitral stenosis.  4. The aortic valve was not well visualized. Aortic valve regurgitation is not visualized. No aortic stenosis is present.  5. The inferior vena cava is dilated in size with <50% respiratory variability, suggesting right atrial pressure of 15 mmHg. FINDINGS  Left Ventricle: Left ventricular ejection fraction, by estimation, is 60 to 65%. The left ventricle has normal function. The left ventricle has no regional wall motion abnormalities. The left ventricular internal cavity size was normal in size. There is  no left ventricular hypertrophy. Left ventricular diastolic parameters are indeterminate. Right Ventricle: The right ventricular size is normal. No increase in right ventricular wall thickness. Right ventricular systolic function is normal. Tricuspid regurgitation signal is inadequate for assessing PA pressure. Left Atrium: Left atrial size was normal in size.  Right Atrium: Right atrial size was not well visualized. Pericardium: There is no evidence of pericardial effusion. Mitral Valve: The mitral valve is normal in structure. No evidence of mitral valve regurgitation. No evidence of mitral valve stenosis. Tricuspid Valve: The tricuspid valve is not well visualized. Tricuspid valve regurgitation is not demonstrated. No evidence of tricuspid stenosis. Aortic Valve: The aortic valve was not well visualized. Aortic valve regurgitation is not visualized. No aortic stenosis is present. Aortic valve mean gradient measures 3.0 mmHg. Aortic valve peak gradient measures 6.3 mmHg. Aortic valve area, by VTI measures 3.19 cm. Pulmonic Valve: The pulmonic valve was not well visualized. Pulmonic valve regurgitation is not visualized. No evidence of pulmonic stenosis. Aorta: The aortic root is normal in size and structure. Venous: The inferior vena cava is dilated in size with less than 50% respiratory variability, suggesting right atrial pressure of 15 mmHg. IAS/Shunts: No atrial level shunt detected by color flow Doppler.  LEFT VENTRICLE PLAX 2D LVIDd:         4.90 cm     Diastology LVIDs:         3.00 cm     LV e' medial:    11.40 cm/s LV PW:         0.90 cm     LV E/e' medial:  12.9 LV IVS:        0.80 cm     LV e' lateral:   16.50 cm/s LVOT diam:     2.10 cm     LV E/e' lateral: 8.9 LV SV:         86 LV SV Index:   42 LVOT Area:     3.46 cm                             3D Volume EF: LV Volumes (MOD)           3D EF:        49 % LV vol d, MOD A2C: 83.4 ml LV EDV:       151 ml LV vol d, MOD A4C: 88.1 ml LV  ESV:       77 ml LV vol s, MOD A2C: 32.6 ml LV SV:        73 ml LV vol s, MOD A4C: 46.4 ml LV SV MOD A2C:     50.8 ml LV SV MOD A4C:     88.1 ml LV SV MOD BP:      48.9 ml RIGHT VENTRICLE RV Basal diam:  4.00 cm RV Mid diam:    3.30 cm RV S prime:     15.30 cm/s TAPSE (M-mode): 2.4 cm LEFT ATRIUM             Index        RIGHT ATRIUM           Index LA diam:        3.90 cm 1.91  cm/m   RA Area:     13.40 cm LA Vol (A2C):   53.9 ml 26.42 ml/m  RA Volume:   32.20 ml  15.78 ml/m LA Vol (A4C):   50.5 ml 24.75 ml/m LA Biplane Vol: 52.2 ml 25.59 ml/m  AORTIC VALVE AV Area (Vmax):    3.02 cm AV Area (Vmean):   2.65 cm AV Area (VTI):     3.19 cm AV Vmax:           125.08 cm/s AV Vmean:          80.584 cm/s AV VTI:            0.270 m AV Peak Grad:      6.3 mmHg AV Mean Grad:      3.0 mmHg LVOT Vmax:         109.00 cm/s LVOT Vmean:        61.600 cm/s LVOT VTI:          0.248 m LVOT/AV VTI ratio: 0.92  AORTA Ao Root diam: 3.00 cm Ao Asc diam:  2.90 cm MITRAL VALVE MV Area (PHT): 3.39 cm     SHUNTS MV Decel Time: 224 msec     Systemic VTI:  0.25 m MV E velocity: 147.00 cm/s  Systemic Diam: 2.10 cm Carlyle Dolly MD Electronically signed by Carlyle Dolly MD Signature Date/Time: 02/17/2022/2:15:59 PM    Final    ECHO TEE  Result Date: 03/03/2022    TRANSESOPHOGEAL ECHO REPORT   Patient Name:   CHANDELLE HARKEY Date of Exam: 03/03/2022 Medical Rec #:  [id number]           Height:       65.0 in Accession #:    [id number]          Weight:       218.9 lb Date of Birth:  December 02, 1970           BSA:          2.056 m Patient Age:    80 years            BP:           134/53 mmHg Patient Gender: F                   HR:           98 bpm. Exam Location:  Inpatient Procedure: Color Doppler and Transesophageal Echo Indications:     Bacteremia  History:         Patient has prior history of Echocardiogram examinations, most  recent 02/17/2022. COPD; Risk Factors:Diabetes, Hypertension and                  Former Smoker. GERD. MRSA.  Sonographer:     Clayton Lefort RDCS (AE) Referring Phys:  [id number] HAO MENG Diagnosing Phys: Kirk Ruths MD PROCEDURE: After discussion of the risks and benefits of a TEE, an informed consent was obtained from the patient. The transesophogeal probe was passed without difficulty through the esophogus of the patient. Sedation performed by different physician. The  patient was monitored while under deep sedation. Anesthestetic sedation was provided intravenously by Anesthesiology: 111.23m of Propofol, 557mof Lidocaine. Image quality was good. The patient developed no complications during the procedure. IMPRESSIONS  1. No vegetations.  2. Left ventricular ejection fraction, by estimation, is 55 to 60%. The left ventricle has normal function. The left ventricle has no regional wall motion abnormalities.  3. Right ventricular systolic function is normal. The right ventricular size is normal.  4. No left atrial/left atrial appendage thrombus was detected.  5. The mitral valve is normal in structure. Trivial mitral valve regurgitation.  6. The aortic valve is tricuspid. Aortic valve regurgitation is trivial.  7. There is mild (Grade II) plaque involving the descending aorta. FINDINGS  Left Ventricle: Left ventricular ejection fraction, by estimation, is 55 to 60%. The left ventricle has normal function. The left ventricle has no regional wall motion abnormalities. The left ventricular internal cavity size was normal in size. Right Ventricle: The right ventricular size is normal. Right ventricular systolic function is normal. Left Atrium: Left atrial size was normal in size. No left atrial/left atrial appendage thrombus was detected. Right Atrium: Right atrial size was normal in size. Pericardium: There is no evidence of pericardial effusion. Mitral Valve: The mitral valve is normal in structure. Trivial mitral valve regurgitation. Tricuspid Valve: The tricuspid valve is normal in structure. Tricuspid valve regurgitation is trivial. Aortic Valve: The aortic valve is tricuspid. Aortic valve regurgitation is trivial. Pulmonic Valve: The pulmonic valve was normal in structure. Pulmonic valve regurgitation is trivial. Aorta: The aortic root is normal in size and structure. There is mild (Grade II) plaque involving the descending aorta. IAS/Shunts: No atrial level shunt detected by  color flow Doppler. Additional Comments: No vegetations.   AORTA Ao Root diam: 3.20 cm Ao Asc diam:  3.00 cm BrKirk RuthsD Electronically signed by BrKirk RuthsD Signature Date/Time: 03/03/2022/2:19:06 PM    Final     Microbiology: Results for orders placed or performed during the hospital encounter of 02/24/22  Culture, blood (routine x 2) Call MD if unable to obtain prior to antibiotics being given     Status: None   Collection Time: 02/24/22  6:07 PM   Specimen: BLOOD RIGHT HAND  Result Value Ref Range Status   Specimen Description BLOOD RIGHT HAND  Final   Special Requests   Final    BOTTLES DRAWN AEROBIC AND ANAEROBIC Blood Culture adequate volume   Culture   Final    NO GROWTH 5 DAYS [facility]formed at [facility] [place] [address]  Report Status 03/01/2022 FINAL  Final  Culture, blood (routine x 2) Call MD if unable to obtain prior to antibiotics being given     Status: None   Collection Time: 02/24/22  6:09 PM   Specimen: BLOOD LEFT HAND  Result Value Ref Range Status   Specimen Description BLOOD LEFT HAND  Final   Special Requests   Final  BOTTLES DRAWN AEROBIC AND ANAEROBIC Blood Culture adequate volume   Culture   Final    NO GROWTH 5 DAYS Performed at [facility], [address] [place],  [address]    Report Status 03/01/2022 FINAL  Final  Body fluid culture w Gram Stain     Status: None   Collection Time: 02/26/22 10:23 AM   Specimen: Pleural Fluid  Result Value Ref Range Status   Specimen Description PLEURAL  Final   Special Requests NONE  Final   Gram Stain   Final    FEW WBC PRESENT,BOTH PMN AND MONONUCLEAR NO ORGANISMS SEEN    Culture   Final    NO GROWTH Performed at [facility], [address], [place],  [address]    Report Status 03/01/2022 FINAL  Final    Labs: CBC: Recent Labs  Lab 02/26/22 0334 02/27/22 0550 02/28/22 0549 03/02/22 0453 03/04/22 0710  WBC 23.4* 14.4* 11.3* 9.7 8.4  HGB  13.6 10.9* 11.8* 10.0* 10.8*  HCT 42.7 34.2* 37.7 31.5* 34.9*  MCV 98.4 98.0 97.4 98.7 100.0  PLT 309 319 362 405* 982*   Basic Metabolic Panel: Recent Labs  Lab 02/26/22 0334 02/27/22 0550 02/28/22 0549 03/02/22 0453 03/04/22 0710  NA 136 135 135 135 136  K 3.4* 3.4* 3.7 4.3 3.9  CL 95* 99 102 97* 96*  CO2 _0 32 32  GLUCOSE 101* 115* 131* 150* 187*  BUN _1 CREATININE 0.53 0.49 0.39* 0.38* 0.41*  CALCIUM 9.0 8.3* 8.6* 8.7* 9.1  MG  --   --  1.8 1.9  --    Liver Function Tests: Recent Labs  Lab 02/26/22 0334 02/26/22 1056  AST 16  --   ALT 21  --   ALKPHOS 54  --   BILITOT 0.8  --   PROT 7.1 7.0  ALBUMIN 2.5*  --    CBG: Recent Labs  Lab 03/03/22 0610 03/03/22 1416 03/03/22 1711 03/03/22 2112 03/04/22 0608  GLUCAP 168* 131* 188* 183* 190*    Discharge time spent: greater than 30 minutes.  Signed: Tawni Millers, MD [facility] 03/04/2022

## 2022-03-04 NOTE — Anesthesia Postprocedure Evaluation (Signed)
Anesthesia Post Note  Patient: Beverly Tran  Procedure(s) Performed: TRANSESOPHAGEAL ECHOCARDIOGRAM (TEE)     Patient location during evaluation: PACU Anesthesia Type: MAC Level of consciousness: awake Pain management: pain level controlled Vital Signs Assessment: post-procedure vital signs reviewed and stable Respiratory status: spontaneous breathing Cardiovascular status: stable Postop Assessment: no apparent nausea or vomiting Anesthetic complications: no   No notable events documented.  Last Vitals:  Vitals:   03/04/22 0730 03/04/22 0840  BP: 133/89   Pulse: (!) 108   Resp: 18   Temp: 36.8 C   SpO2: 91% 100%    Last Pain:  Vitals:   03/04/22 1009  TempSrc:   PainSc: 6    Pain Goal: Patients Stated Pain Goal: 0 (02/25/22 2229)                 Huston Foley

## 2022-03-04 NOTE — Progress Notes (Signed)
D/c tele and IV. Went over AVS with pt and all questions were addressed.   Letina Luckett S Azriel Dancy, RN  

## 2022-03-17 ENCOUNTER — Telehealth (INDEPENDENT_AMBULATORY_CARE_PROVIDER_SITE_OTHER): Payer: 59 | Admitting: Infectious Diseases

## 2022-03-17 ENCOUNTER — Other Ambulatory Visit: Payer: Self-pay

## 2022-03-17 ENCOUNTER — Encounter: Payer: Self-pay | Admitting: Infectious Diseases

## 2022-03-17 DIAGNOSIS — Z029 Encounter for administrative examinations, unspecified: Secondary | ICD-10-CM | POA: Insufficient documentation

## 2022-03-17 DIAGNOSIS — J869 Pyothorax without fistula: Secondary | ICD-10-CM | POA: Diagnosis not present

## 2022-03-17 DIAGNOSIS — R7881 Bacteremia: Secondary | ICD-10-CM | POA: Diagnosis not present

## 2022-03-17 DIAGNOSIS — B9562 Methicillin resistant Staphylococcus aureus infection as the cause of diseases classified elsewhere: Secondary | ICD-10-CM | POA: Diagnosis not present

## 2022-03-17 DIAGNOSIS — Z5181 Encounter for therapeutic drug level monitoring: Secondary | ICD-10-CM | POA: Diagnosis not present

## 2022-03-17 MED ORDER — LINEZOLID 600 MG PO TABS
600.0000 mg | ORAL_TABLET | Freq: Two times a day (BID) | ORAL | 0 refills | Status: DC
Start: 2022-03-17 — End: 2022-03-21

## 2022-03-17 NOTE — Progress Notes (Addendum)
Virtual Visit via Video Note  I connected withNAME@ on 03/17/22 at 10:00 AM EDT by a video enabled telemedicine application and verified that I am speaking with the correct person using two identifiers.  Location: Patient: Home  Provider: [facility]   I discussed the limitations of evaluation and management by telemedicine and the availability of in person appointments. The patient expressed understanding and agreed to proceed.  [place] [facility]  Patient Active Problem List   Diagnosis Date Noted   Class 2 obesity 03/02/2022   Iron deficiency anemia 03/02/2022   Hypokalemia 03/02/2022   Empyema (HCC)    COPD (chronic obstructive pulmonary disease) ([place]) 02/24/2022   Anxiety 02/24/2022   Multifocal pneumonia 02/16/2022   Leukocytosis 02/16/2022   Type 2 diabetes mellitus with hyperlipidemia ([place]) 02/16/2022   Hypoalbuminemia due to protein-calorie malnutrition ([place]) 02/16/2022   Lactic acidosis 02/16/2022   Elevated brain natriuretic peptide (BNP) level 02/16/2022   COPD with acute exacerbation ([place]) 02/16/2022   Acute on chronic respiratory failure with hypoxia (HCC) 02/16/2022   Gastroesophageal reflux disease without esophagitis 04/06/2021   Chronic diarrhea 12/09/2020   Dysphagia 12/09/2020   Upper airway cough syndrome vs AB 04/14/2020   Essential hypertension 04/14/2020    Patient's Medications  New Prescriptions   No medications on file  Previous Medications   AMLODIPINE (NORVASC) 10 MG TABLET    Take 1 tablet (10 mg total) by mouth daily.   CHOLECALCIFEROL (VITAMIN D3) 10 MCG (400 UNIT) CAPS    Take 400 Units by mouth daily.   CLONAZEPAM (KLONOPIN) 0.5 MG TABLET    Take 0.5 mg by mouth 2 (two) times daily as needed.   DULOXETINE (CYMBALTA) 60 MG CAPSULE    Take 60 mg by mouth daily.   EPINEPHRINE 0.3 MG/0.3 ML IJ SOAJ INJECTION    Inject 0.3 mg into the muscle as directed.   FAMOTIDINE (PEPCID) 20 MG TABLET    TAKE ONE TABLET BY MOUTH AFTER SUPPER.    FEROSUL 325 (65 FE) MG TABLET    Take 325 mg by mouth 2 (two) times daily.   FUROSEMIDE (LASIX) 40 MG TABLET    Take 1 tablet (40 mg total) by mouth daily as needed for fluid or edema (as needed for leg swelling or worsening shortnes of breath.).   GABAPENTIN (NEURONTIN) 600 MG TABLET    Take 600 mg by mouth 3 (three) times daily.   HYDROXYZINE (ATARAX/VISTARIL) 25 MG TABLET    Take 25 mg by mouth daily as needed for anxiety.   IPRATROPIUM-ALBUTEROL (DUONEB) 0.5-2.5 (3) MG/3ML SOLN    Take 3 mLs by nebulization every 4 (four) hours as needed (shortness of breath or wheezing).   IRBESARTAN (AVAPRO) 150 MG TABLET    Take 1 tablet (150 mg total) by mouth daily.   LEVOCETIRIZINE (XYZAL) 5 MG TABLET    Take 10 mg by mouth at bedtime.   LIDOCAINE (XYLOCAINE) 5 % OINTMENT    Apply topically.   LINEZOLID (ZYVOX) 600 MG TABLET    Take 1 tablet (600 mg total) by mouth 2 (two) times daily for 22 days. Last day of therapy : 03/26/22   MECLIZINE (ANTIVERT) 25 MG TABLET    Take 25 mg by mouth 2 (two) times daily as needed for nausea.   METFORMIN (GLUCOPHAGE) 500 MG TABLET    Take 500-1,000 mg by mouth See admin instructions. Take 1000 mg by mouth in the morning and 500 mg in the evening   MONTELUKAST (SINGULAIR) 10  MG TABLET    Take 10 mg by mouth daily with supper.   NALOXONE (NARCAN) NASAL SPRAY 4 MG/0.1 ML    1 spray once.   OXYCODONE-ACETAMINOPHEN (PERCOCET) 10-325 MG TABLET    Take 1 tablet by mouth 3 (three) times daily as needed for moderate pain.   OZEMPIC, 1 MG/DOSE, 4 MG/3ML SOPN    Inject 1 mg into the skin every 7 (seven) days.   PANTOPRAZOLE (PROTONIX) 40 MG TABLET    TAKE ONE TABLET BY MOUTH DAILY (MORNING)   PIOGLITAZONE (ACTOS) 45 MG TABLET    Take 45 mg by mouth daily.   PRAMIPEXOLE (MIRAPEX) 1 MG TABLET    Take 1 mg by mouth at bedtime.   PROMETHAZINE (PHENERGAN) 25 MG TABLET    TAKE ONE TABLET BY MOUTH EVERY 8 HOURS AS NEEDED FOR NAUSEA AND VOMITING   PROPRANOLOL (INDERAL) 10 MG TABLET     Take 10 mg by mouth at bedtime.   QUETIAPINE (SEROQUEL) 50 MG TABLET    Take 100 mg by mouth at bedtime.   ROSUVASTATIN (CRESTOR) 10 MG TABLET    Take 10 mg by mouth at bedtime.   SYMBICORT 80-4.5 MCG/ACT INHALER    Inhale 2 puffs into the lungs daily.  Modified Medications   No medications on file  Discontinued Medications   No medications on file    History of Present Illness: Here for HFU for MRSA bacteremia and empyema. Visit was first started as a video visit but later switched to phone visit as there was multiple disconnections in the video call. She has been taking linezolid twice a day. Denies missing doses or any concerns with the antibiotics like nausea, vomiting, rashes and diarrhea. She feels better since getting discharged from the hospital. Doing breathing tx 4 times a day. Denies SOB but has coughwith little phlegm yellow in color but lately it is more clear. She does not have a transportation to come for in person visit and would like to do chest [place] and lab work at [facility] . She is aware about fu with Pulmonary. No new medications started since hospital discharge.  ROS all systems reviewed with pertinent positives and negatives as listed above  Past Medical History:  Diagnosis Date   Anxiety    Arthritis    Asthma    Back pain, chronic    Depression    Diabetes mellitus without complication (HCC)    GERD (gastroesophageal reflux disease)    High cholesterol    Hypertension    IBS (irritable bowel syndrome)    Migraine      Social History   Tobacco Use   Smoking status: Former    Packs/day: 1.00    Years: 7.00    Pack years: 7.00    Types: Cigarettes    Quit date: 02/03/2020    Years since quitting: 2.1   Smokeless tobacco: Never  Vaping Use   Vaping Use: Never used  Substance Use Topics   Alcohol use: No   Drug use: No    Family History  Problem Relation Age of Onset   Seizures Mother    Heart failure Mother    Heart failure Father     Colon cancer Neg Hx    Colon polyps Neg Hx     Allergies  Allergen Reactions   Diclofenac Anaphylaxis   Salami [Pickled Meat] Shortness Of Breath and Swelling    Also Allergic to POPCORN: same reaction   Aspirin Other (See Comments)  Nose bleeds   Bee Venom Swelling    WASP/HORNET   Compazine [Prochlorperazine] Hives   Imitrex [Sumatriptan] Hives   Lamotrigine Other (See Comments)    confusion   Lisinopril     Other reaction(s): Unknown   Nubain [Nalbuphine Hcl] Swelling and Other (See Comments)    Eye swelling   Thorazine [Chlorpromazine] Hives   Topamax [Topiramate] Other (See Comments)    Confusion and out of it   Voltaren [Diclofenac Sodium] Swelling   Zofran [Ondansetron Hcl] Nausea Only    Health Maintenance  Topic Date Due   FOOT EXAM  Never done   OPHTHALMOLOGY EXAM  Never done   Hepatitis C Screening  Never done   TETANUS/TDAP  Never done   PAP SMEAR-Modifier  Never done   COLONOSCOPY (Pts 45-71yrs Insurance coverage will need to be confirmed)  Never done   COVID-19 Vaccine (4 - Booster) 11/03/2020   MAMMOGRAM  Never done   Zoster Vaccines- Shingrix (1 of 2) Never done   INFLUENZA VACCINE  05/17/2022   HEMOGLOBIN A1C  08/19/2022   HIV Screening  Completed   HPV VACCINES  Aged Out    Observations/Objective:   Assessment # High grade MRSA bacteremia - Repeat blood cx 5/3 and 5/11 no growth  - TTE 5/4 negative - TEE 5/18 negative for vegetations/endocarditis    # RT loculated Empyema/RLL PNA s/p rt chest tube ( cx no growth ), s/p fibrinolytics - Evaluated by Pulmonary, high risk candidate for VATS w decortication per Pulmonary. Chest tube removed 5/15.  Recommended at least 4 weeks of abtx with a fu imaging. Plan to follow OP  Plan Continue linezolid 600 mg po bid as is CBC Chest Xray  Fu in 2 weeks  Fu with Pulmonary as instructed   Follow Up Instructions: 2 weeks   I discussed the assessment and treatment plan with the patient. The patient  was provided an opportunity to ask questions and all were answered. The patient agreed with the plan and demonstrated an understanding of the instructions.   The patient was advised to call back or seek an in-person evaluation if the symptoms worsen or if the condition fails to improve as anticipated.  I provided 30  minutes of non-face-to-face time during this encounter.  Wilber Oliphant, [place] [facility] [employer] [facility] [phone] pager   [phone] cell 03/17/2022, 9:36 AM

## 2022-03-18 ENCOUNTER — Telehealth: Payer: Self-pay

## 2022-03-18 NOTE — Telephone Encounter (Signed)
Patient called stating that she forgot to tell Dr.Manandhar during virtual visit yesterday that she has been having a lot of nausea since taking Linezolid - taking phenergan helps a little. Patient also having diarrhea x 2 days - taking OTC anti-diarrhea medication. Please advise.

## 2022-03-18 NOTE — Telephone Encounter (Signed)
Patient aware and verbalized her understanding. Patient is to notify us if diarrhea worsens.    Yunique Dearcos Lesli Albee, CMA

## 2022-03-18 NOTE — Telephone Encounter (Signed)
Would continue linezolid with antiemetics as needed as she is doing now and watch for worsening of diarrhea.

## 2022-03-21 ENCOUNTER — Other Ambulatory Visit: Payer: Self-pay

## 2022-03-21 MED ORDER — DOXYCYCLINE HYCLATE 100 MG PO TABS: 100.0000 mg | ORAL_TABLET | Freq: Two times a day (BID) | ORAL | 0 refills | Status: AC

## 2022-03-21 NOTE — Telephone Encounter (Signed)
Patient advised to stop the linezolid and I have sent doxycycline to the pharmacy for her. Advised her to take the doxy po BID for 14 days. Betheny Suchecki T Pricilla Loveless

## 2022-03-21 NOTE — Telephone Encounter (Signed)
I attempted to reach the patient and let her know of the medication change. No answer and voicemail left for her to call back. Mayur Duman T Brooks Sailors

## 2022-03-21 NOTE — Telephone Encounter (Signed)
Patient is calling complaining of NVD still with the Linezolid and it doesn't seem to be improving.  Patient states the imodium is giving her very little relief with the diarrhea.  Please advise. Rheana Casebolt T Pricilla Loveless

## 2022-03-22 ENCOUNTER — Other Ambulatory Visit: Payer: Self-pay | Admitting: Internal Medicine

## 2022-03-25 ENCOUNTER — Other Ambulatory Visit: Payer: Self-pay | Admitting: Gastroenterology

## 2022-03-29 ENCOUNTER — Encounter: Payer: Self-pay | Admitting: Infectious Diseases

## 2022-03-29 ENCOUNTER — Telehealth (INDEPENDENT_AMBULATORY_CARE_PROVIDER_SITE_OTHER): Payer: 59 | Admitting: Infectious Diseases

## 2022-03-29 ENCOUNTER — Other Ambulatory Visit: Payer: Self-pay

## 2022-03-29 DIAGNOSIS — Z5181 Encounter for therapeutic drug level monitoring: Secondary | ICD-10-CM

## 2022-03-29 DIAGNOSIS — R0781 Pleurodynia: Secondary | ICD-10-CM | POA: Diagnosis not present

## 2022-03-29 DIAGNOSIS — J869 Pyothorax without fistula: Secondary | ICD-10-CM | POA: Diagnosis not present

## 2022-03-29 DIAGNOSIS — R7881 Bacteremia: Secondary | ICD-10-CM

## 2022-03-29 DIAGNOSIS — B9562 Methicillin resistant Staphylococcus aureus infection as the cause of diseases classified elsewhere: Secondary | ICD-10-CM

## 2022-03-29 DIAGNOSIS — R0789 Other chest pain: Secondary | ICD-10-CM

## 2022-03-29 NOTE — Progress Notes (Signed)
Virtual Visit via Video Note  I connected withNAME@ on 03/29/22 at 10:30 AM EDT by a video enabled telemedicine application and verified that I am speaking with the correct person using two identifiers.  Location: Patient: Home  Provider: [facility]   I discussed the limitations of evaluation and management by telemedicine and the availability of in person appointments. The patient expressed understanding and agreed to proceed.  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   Medication monitoring encounter 03/17/2022   MRSA bacteremia 03/17/2022   Class 2 obesity 03/02/2022   Iron deficiency anemia 03/02/2022   Hypokalemia 03/02/2022   Empyema (HCC)    COPD (chronic obstructive pulmonary disease) ([place]) 02/24/2022   Anxiety 02/24/2022   Multifocal pneumonia 02/16/2022   Leukocytosis 02/16/2022   Type 2 diabetes mellitus with hyperlipidemia ([place]) 02/16/2022   Hypoalbuminemia due to protein-calorie malnutrition ([place]) 02/16/2022   Lactic acidosis 02/16/2022   Elevated brain natriuretic peptide (BNP) level 02/16/2022   COPD with acute exacerbation ([place]) 02/16/2022   Acute on chronic respiratory failure with hypoxia (HCC) 02/16/2022   Gastroesophageal reflux disease without esophagitis 04/06/2021   Chronic diarrhea 12/09/2020   Dysphagia 12/09/2020   Upper airway cough syndrome vs AB 04/14/2020   Essential hypertension 04/14/2020    Patient's Medications  New Prescriptions   No medications on file  Previous Medications   AMLODIPINE (NORVASC) 10 MG TABLET    Take 1 tablet (10 mg total) by mouth daily.   CHOLECALCIFEROL (VITAMIN D3) 10 MCG (400 UNIT) CAPS    Take 400 Units by mouth daily.   CLONAZEPAM (KLONOPIN) 0.5 MG TABLET    Take 0.5 mg by mouth 2 (two) times daily as needed.   DOXYCYCLINE (VIBRA-TABS) 100 MG TABLET    Take 1 tablet (100 mg total) by mouth 2 (two) times daily for 14 days.   DULOXETINE  (CYMBALTA) 60 MG CAPSULE    Take 60 mg by mouth daily.   EPINEPHRINE 0.3 MG/0.3 ML IJ SOAJ INJECTION    Inject 0.3 mg into the muscle as directed.   FAMOTIDINE (PEPCID) 20 MG TABLET    TAKE ONE TABLET BY MOUTH AFTER SUPPER. (,EVENING)   FEROSUL 325 (65 FE) MG TABLET    Take 325 mg by mouth 2 (two) times daily.   FUROSEMIDE (LASIX) 40 MG TABLET    Take 1 tablet (40 mg total) by mouth daily as needed for fluid or edema (as needed for leg swelling or worsening shortnes of breath.).   GABAPENTIN (NEURONTIN) 600 MG TABLET    Take 600 mg by mouth 3 (three) times daily.   HYDROXYZINE (ATARAX/VISTARIL) 25 MG TABLET    Take 25 mg by mouth daily as needed for anxiety.   IPRATROPIUM-ALBUTEROL (DUONEB) 0.5-2.5 (3) MG/3ML SOLN    Take 3 mLs by nebulization every 4 (four) hours as needed (shortness of breath or wheezing).   IRBESARTAN (AVAPRO) 150 MG TABLET    Take 1 tablet (150 mg total) by mouth daily.   LEVOCETIRIZINE (XYZAL) 5 MG TABLET    Take 10 mg by mouth at bedtime.   LIDOCAINE (XYLOCAINE) 5 % OINTMENT    Apply topically.   MECLIZINE (ANTIVERT) 25 MG TABLET    Take 25 mg by mouth 2 (two) times daily as needed for nausea.   METFORMIN (GLUCOPHAGE) 500 MG TABLET    Take 500-1,000 mg by mouth See admin instructions. Take 1000 mg by mouth in the  morning and 500 mg in the evening   MONTELUKAST (SINGULAIR) 10 MG TABLET    Take 10 mg by mouth daily with supper.   NALOXONE (NARCAN) NASAL SPRAY 4 MG/0.1 ML    1 spray once.   OXYCODONE-ACETAMINOPHEN (PERCOCET) 10-325 MG TABLET    Take 1 tablet by mouth 3 (three) times daily as needed for moderate pain.   OZEMPIC, 1 MG/DOSE, 4 MG/3ML SOPN    Inject 1 mg into the skin every 7 (seven) days.   PANTOPRAZOLE (PROTONIX) 40 MG TABLET    TAKE ONE TABLET BY MOUTH DAILY (MORNING)   PIOGLITAZONE (ACTOS) 45 MG TABLET    Take 45 mg by mouth daily.   PRAMIPEXOLE (MIRAPEX) 1 MG TABLET    Take 1 mg by mouth at bedtime.   PROMETHAZINE (PHENERGAN) 25 MG TABLET    TAKE ONE TABLET  BY MOUTH EVERY 8 HOURS AS NEEDED FOR NAUSEA AND VOMITING   PROPRANOLOL (INDERAL) 10 MG TABLET    Take 10 mg by mouth at bedtime.   QUETIAPINE (SEROQUEL) 50 MG TABLET    Take 100 mg by mouth at bedtime.   ROSUVASTATIN (CRESTOR) 10 MG TABLET    Take 10 mg by mouth at bedtime.   SYMBICORT 80-4.5 MCG/ACT INHALER    Inhale 2 puffs into the lungs daily.  Modified Medications   No medications on file  Discontinued Medications   No medications on file    History of Present Illness: Here for HFU for MRSA bacteremia and empyema. After last seen 6/1, She was switched from linezolid to doxycycline on 6/2 due to nausea, vomiting and diarrhea while on linezolid. She feels a lot better since being on doxycycline except feeling nauseated at times ( manageable). Has mild dry cough, SOB is also improving. She has weaned oxygen down from 5L to 4l in the last 2 days with no issues. She is able to walk around the house, go to bathroom and help her husband prepare supper. She cannot walk stairs as she has bad arthritis. She has not had chest xray and CBC done as she does not have transportation. Her friend is helping with transportation and she would have get those done this Friday. She is aware about fu appt with Dr [name] on 7/6.  Complains that Rt side rib area  where she had chest tube is hurting for last 2 days. She denies any pain/tenderness and swelling in that area. She had her husband look at that area and nothing concerning.  ROS all systems reviewed with pertinent positives and negatives as listed above  Past Medical History:  Diagnosis Date   Anxiety    Arthritis    Asthma    Back pain, chronic    Depression    Diabetes mellitus without complication (HCC)    GERD (gastroesophageal reflux disease)    High cholesterol    Hypertension    IBS (irritable bowel syndrome)    Migraine    Past Surgical History:  Procedure Laterality Date   APPENDECTOMY     CESAREAN SECTION     CHOLECYSTECTOMY      COLONOSCOPY  2003   poor prep, grossly normal rectum, colon, and TI. Path not available.    ESOPHAGOGASTRODUODENOSCOPY  2003   Dr. Gala Romney:  normal esophagus, stomach, duodenum s/p small bowel biopsy.    HERNIA REPAIR     TEE WITHOUT CARDIOVERSION N/A 03/03/2022   Procedure: TRANSESOPHAGEAL ECHOCARDIOGRAM (TEE);  Surgeon: Lelon Perla, MD;  Location[facility] ENDOSCOPY;  Service: Cardiovascular;  Laterality: N/A;   TUBAL LIGATION      Social History   Tobacco Use   Smoking status: Former    Packs/day: 1.00    Years: 7.00    Total pack years: 7.00    Types: Cigarettes    Quit date: 02/03/2020    Years since quitting: 2.1   Smokeless tobacco: Never  Vaping Use   Vaping Use: Never used  Substance Use Topics   Alcohol use: No   Drug use: No    Family History  Problem Relation Age of Onset   Seizures Mother    Heart failure Mother    Heart failure Father    Colon cancer Neg Hx    Colon polyps Neg Hx     Allergies  Allergen Reactions   Diclofenac Anaphylaxis   Salami [Pickled Meat] Shortness Of Breath and Swelling    Also Allergic to POPCORN: same reaction   Aspirin Other (See Comments)    Nose bleeds   Bee Venom Swelling    WASP/HORNET   Compazine [Prochlorperazine] Hives   Imitrex [Sumatriptan] Hives   Lamotrigine Other (See Comments)    confusion   Lisinopril     Other reaction(s): Unknown   Nubain [Nalbuphine Hcl] Swelling and Other (See Comments)    Eye swelling   Thorazine [Chlorpromazine] Hives   Topamax [Topiramate] Other (See Comments)    Confusion and out of it   Voltaren [Diclofenac Sodium] Swelling   Zofran [Ondansetron Hcl] Nausea Only    Health Maintenance  Topic Date Due   FOOT EXAM  Never done   OPHTHALMOLOGY EXAM  Never done   Hepatitis C Screening  Never done   TETANUS/TDAP  Never done   PAP SMEAR-Modifier  Never done   COLONOSCOPY (Pts 45-85yrs Insurance coverage will need to be confirmed)  Never done   COVID-19 Vaccine (3 - Pfizer series)  11/03/2020   MAMMOGRAM  Never done   Zoster Vaccines- Shingrix (1 of 2) Never done   INFLUENZA VACCINE  05/17/2022   HEMOGLOBIN A1C  08/19/2022   HIV Screening  Completed   HPV VACCINES  Aged Out    Observations/Objective: Able to speak in full sentences ( there was a Teaching laboratory technician. 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 Chest tube removed 5/15.  Recommended at least 4 weeks of abtx with a fu imaging. Plan to follow OP - Linezolid switched to doxycyline on 6/2 due to intolerance - Complete 14 days course of po doxycycline as instructed - Fu pending chest xray and CBC - Fu with Pulmonary  - Fu to be made after chest xray and cbc if anything concerning. Otherwise, as needed for now  # Rt sided rib pain - No erythema/tenderness and swelling per patient - analgesics as needed  - Fu to be made if clinically worsening  Follow Up Instructions: As needed   I discussed the assessment and treatment plan with the  patient. The patient was provided an opportunity to ask questions and all were answered. The patient agreed with the plan and demonstrated an understanding of the instructions.   The patient was advised to call back or seek an in-person evaluation if the symptoms worsen or if the condition fails to improve as anticipated.  I provided 25  minutes of non-face-to-face time during this encounter.  Wilber Oliphant, [facility] [phone] pager   [phone]  cell 03/29/2022, 10:10 AM

## 2022-04-01 ENCOUNTER — Ambulatory Visit (HOSPITAL_COMMUNITY)
Admission: RE | Admit: 2022-04-01 | Discharge: 2022-04-01 | Disposition: A | Discharge status: Home / Self Care | Payer: 59 | Source: Ambulatory Visit | Attending: Infectious Diseases | Admitting: Infectious Diseases

## 2022-04-01 ENCOUNTER — Other Ambulatory Visit (HOSPITAL_COMMUNITY)
Admission: RE | Admit: 2022-04-01 | Discharge: 2022-04-01 | Disposition: A | Discharge status: Home / Self Care | Payer: 59 | Source: Ambulatory Visit | Attending: Infectious Diseases | Admitting: Infectious Diseases

## 2022-04-01 DIAGNOSIS — J869 Pyothorax without fistula: Secondary | ICD-10-CM | POA: Insufficient documentation

## 2022-04-01 LAB — CBC
HCT: 41.5 % (ref 36.0–46.0)
Hemoglobin: 13.2 g/dL (ref 12.0–15.0)
MCH: 31.6 pg (ref 26.0–34.0)
MCHC: 31.8 g/dL (ref 30.0–36.0)
MCV: 99.3 fL (ref 80.0–100.0)
Platelets: 272 10*3/uL (ref 150–400)
RBC: 4.18 MIL/uL (ref 3.87–5.11)
RDW: 16.5 % — ABNORMAL HIGH (ref 11.5–15.5)
WBC: 11.3 10*3/uL — ABNORMAL HIGH (ref 4.0–10.5)
nRBC: 0.2 % (ref 0.0–0.2)

## 2022-04-01 IMAGING — DX DG CHEST 2V
2 series · 2 of 2 positions shown · non-contrast
Comparison: [DATE]

CLINICAL DATA: 51-year-old female with a history of empyema

EXAM:
CHEST - 2 VIEW

[chest pa]
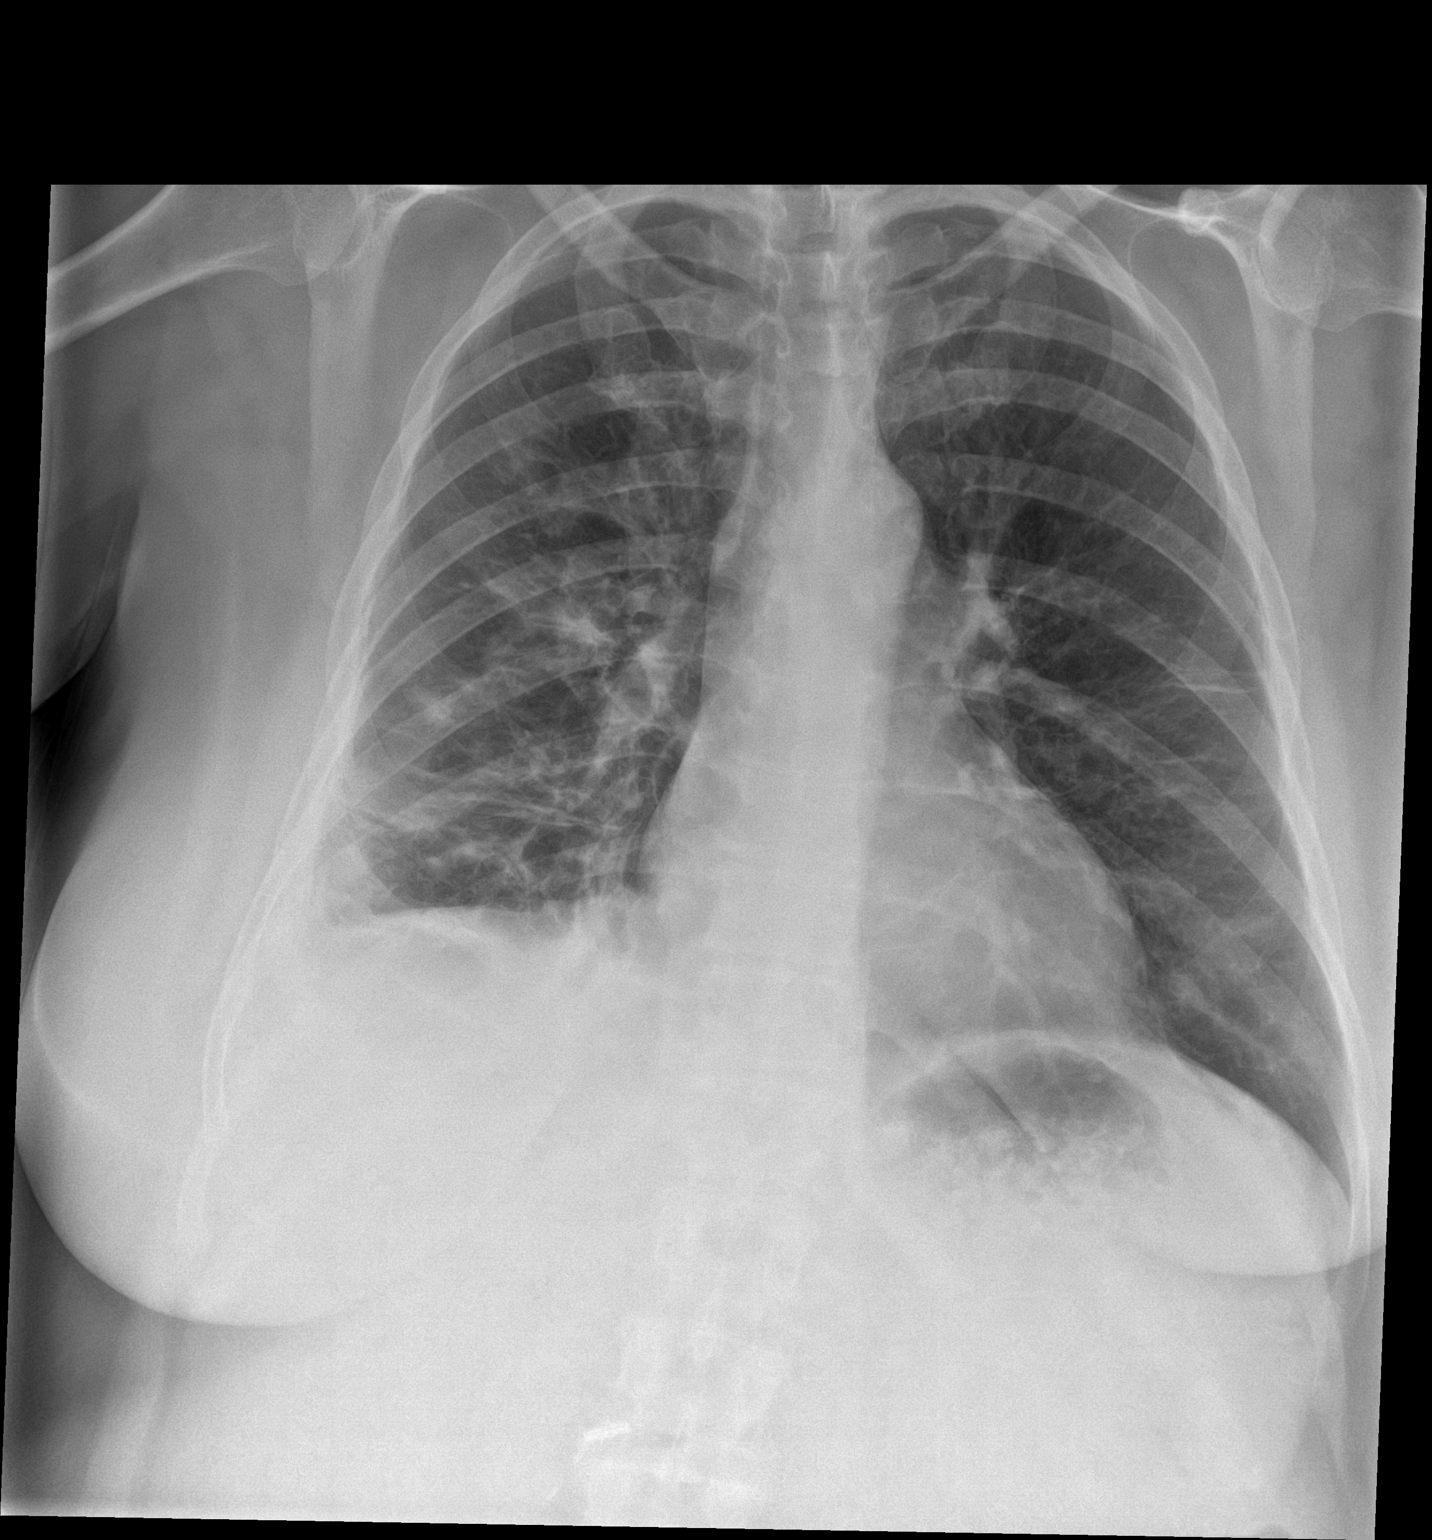

[chest lat]
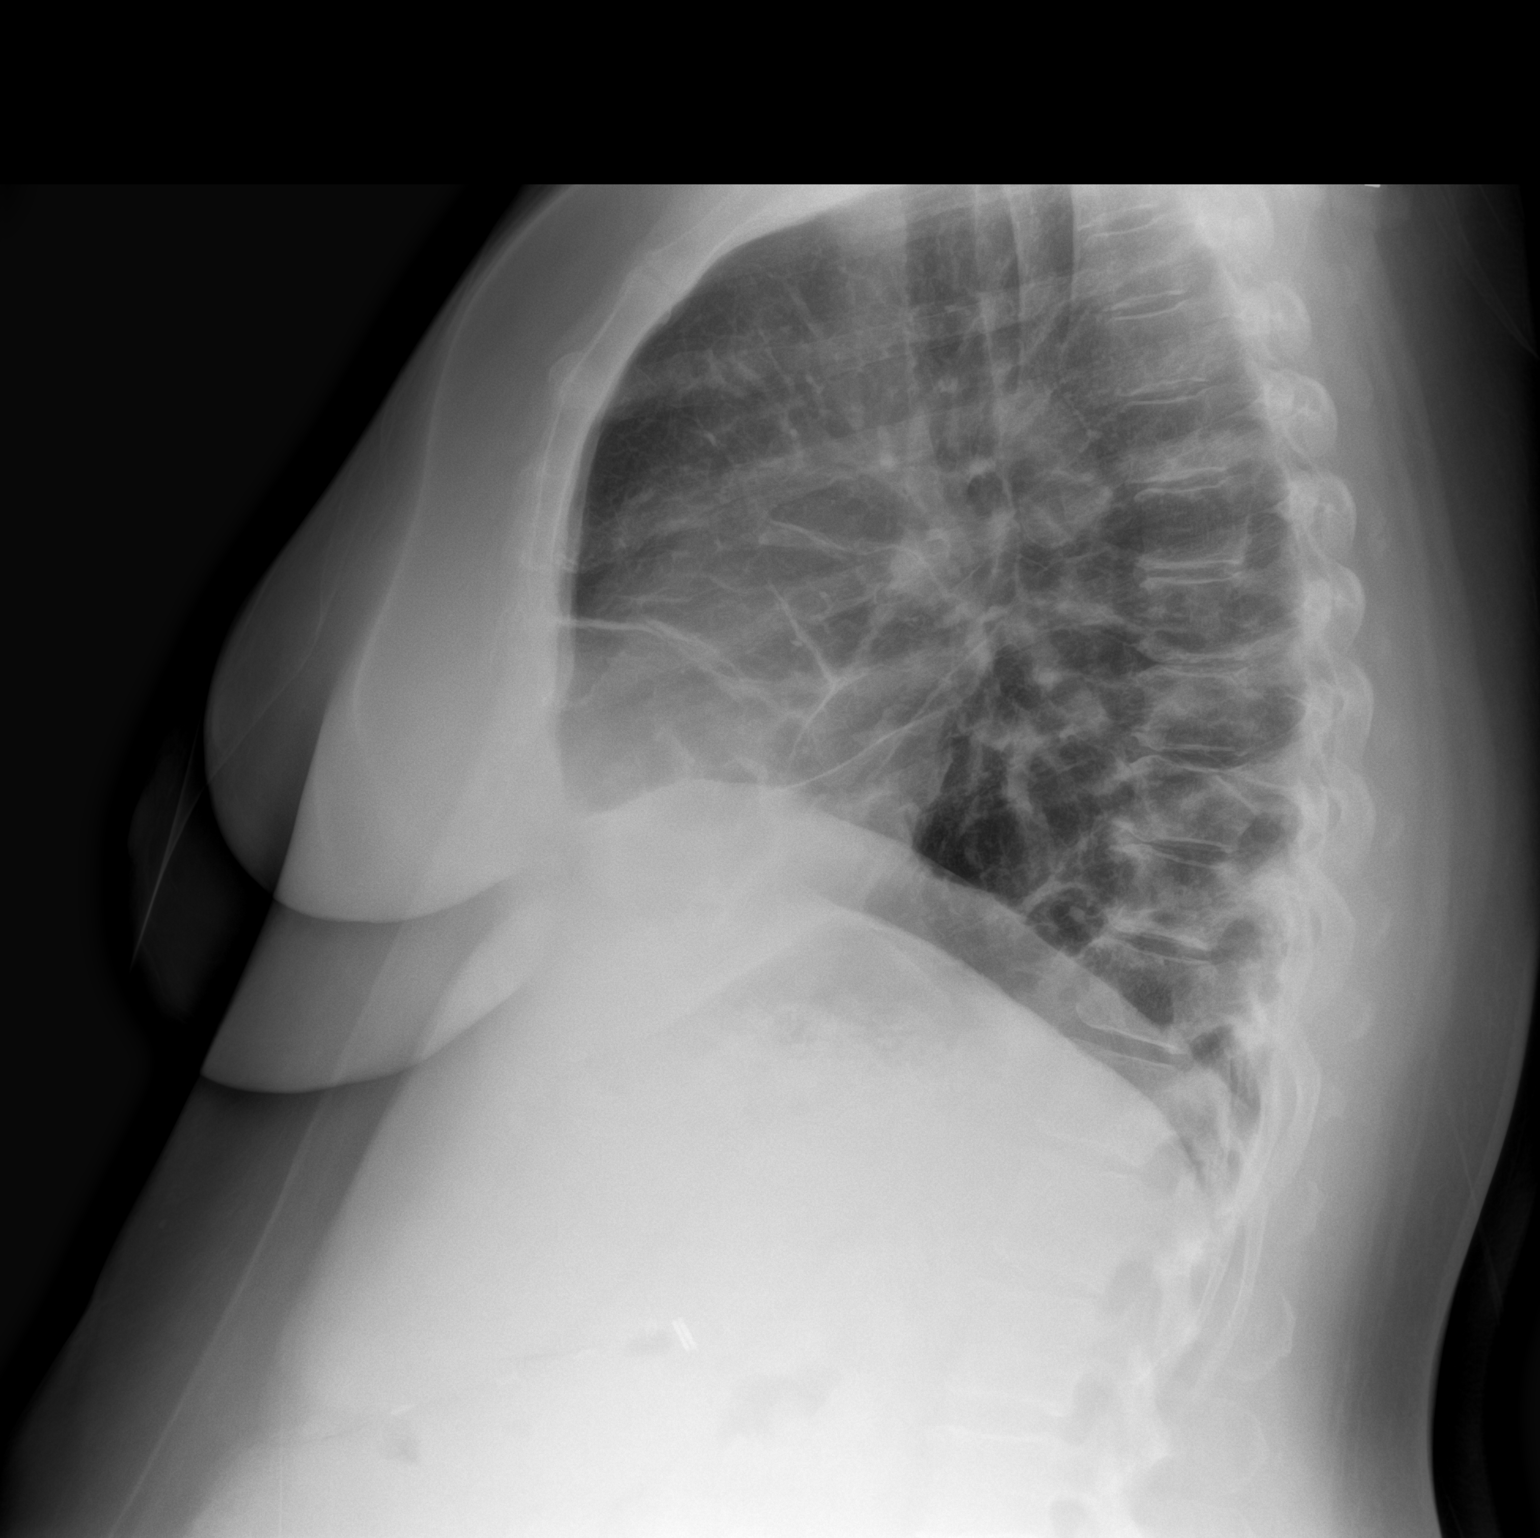

[2 of 2 positions shown; findings below may reference images not displayed]

FINDINGS: Cardiomediastinal silhouette unchanged in size and contour.

Improving aeration at the lung bases with persistent elevation of
the right hemidiaphragm. Reticulonodular opacities at the right
greater than left lung base persist. No left-sided pleural effusion.

Blunting at the right costophrenic angle.

No pneumothorax.

Degenerative changes of the spine.
IMPRESSION: Improving aeration at the lung bases, though with persisting
reticulonodular opacities presumably residual
inflammation/infection.

Trace right-sided pleural fluid/scarring

## 2022-04-04 ENCOUNTER — Telehealth: Payer: Self-pay

## 2022-04-04 NOTE — Telephone Encounter (Signed)
-----   Message from Odette Fraction, MD sent at 04/04/2022  8:36 AM EDT ----- Let her know chest xray overall improving, recommend to complete previously recommended course of abtx and fu with Pulmonary.

## 2022-04-04 NOTE — Telephone Encounter (Signed)
Called patient to relay results, no answer. Left HIPAA compliant voicemail stating that MyChart message will be sent and to please call with any questions.   Sandie Ano, RN

## 2022-04-04 NOTE — Telephone Encounter (Signed)
Patient returned call. Patient aware of results. Patient stated that she has one more dose of abx and has a follow up appointment with pulmonology on 7/6.  Cj Beecher Lesli Albee, CMA

## 2022-04-11 ENCOUNTER — Other Ambulatory Visit: Payer: Self-pay | Admitting: Gastroenterology

## 2022-04-13 ENCOUNTER — Telehealth: Payer: Self-pay | Admitting: Internal Medicine

## 2022-04-13 NOTE — Telephone Encounter (Signed)
Called and notified patient of response and she voiced understanding.Nothing further needed.  

## 2022-04-13 NOTE — Telephone Encounter (Signed)
Called patient and she states that when she was in the hospital, Dr. Sherene Sires told her to bring all of her medications to her next ov with him to ensure that her medication list is accurate and that there are no interactions. Patient states she wants to make sure Dr. Sherene Sires is okay with her taking B12 vitamin with the rest of her medications until she sees him on 04/21/22. Advised patient he is out of office and may take a few days for a response but In the meantime she can follow up with her PCP if she is unsure. She voiced understanding but would like Dr. Rolin Barry opinion. Please advise.

## 2022-04-13 NOTE — Telephone Encounter (Signed)
B12 is fine

## 2022-04-15 ENCOUNTER — Ambulatory Visit: Payer: 59 | Admitting: Gastroenterology

## 2022-04-21 ENCOUNTER — Ambulatory Visit (INDEPENDENT_AMBULATORY_CARE_PROVIDER_SITE_OTHER): Payer: 59 | Admitting: Internal Medicine

## 2022-04-21 ENCOUNTER — Encounter: Payer: Self-pay | Admitting: Internal Medicine

## 2022-04-21 DIAGNOSIS — J9612 Chronic respiratory failure with hypercapnia: Secondary | ICD-10-CM | POA: Diagnosis not present

## 2022-04-21 DIAGNOSIS — J9611 Chronic respiratory failure with hypoxia: Secondary | ICD-10-CM

## 2022-04-21 DIAGNOSIS — R058 Other specified cough: Secondary | ICD-10-CM | POA: Diagnosis not present

## 2022-04-21 DIAGNOSIS — J869 Pyothorax without fistula: Secondary | ICD-10-CM

## 2022-04-21 MED ORDER — SYMBICORT 80-4.5 MCG/ACT IN AERO: INHALATION_SPRAY | RESPIRATORY_TRACT | Status: DC

## 2022-04-21 NOTE — Patient Instructions (Addendum)
Try off the propranolol to see what diffence if any it makes   Plan A = Automatic = Always=    symbicort 80 Take 2 puffs first thing in am and then another 2 puffs about 12 hours later.    Work on inhaler technique:  relax and gently blow all the way out then take a nice smooth full deep breath back in, triggering the inhaler at same time you start breathing in.  Hold for up to 5 seconds if you can. Blow out thru nose. Rinse and gargle with water when done.  If mouth or throat bother you at all,  try brushing teeth/gums/tongue with arm and hammer toothpaste/ make a slurry and gargle and spit out.   - practice with the empty inhaler I gave you today   Plan B = Backup (to supplement plan A, not to replace it) Only use your albuterol inhaler as a rescue medication to be used if you can't catch your breath by resting or doing a relaxed purse lip breathing pattern.  - The less you use it, the better it will work when you need it. - Ok to use the inhaler up to 2 puffs  every 4 hours if you must but call for appointment if use goes up over your usual need - Don't leave home without it !!  (think of it like the spare tire for your car)   Plan C = Crisis (instead of Plan B but only if Plan B stops working) - only use your albuterol nebulizer if you first try Plan B and it fails to help > ok to use the nebulizer up to every 4 hours but if start needing it regularly call for immediate appointment  Make sure you check your oxygen saturation  AT  your highest level of activity (not after you stop)   to be sure it stays over 90% and adjust  02 flow upward to maintain this level if needed but remember to turn it back to previous settings when you stop (to conserve your supply).       Please schedule a follow up office visit in 6 weeks with all inhalers/neb solutions, call sooner if needed with CXR first

## 2022-04-21 NOTE — Progress Notes (Signed)
B[facility]ly Tran, female    DOB: 1970/12/19     MRN: [id number]   Brief patient profile:  51 yowf quit smoking 01/2020  "born with bronchitis"  (mother smoked)  On "breathing pill they quit making" then around age 51 changed to  inhalers as needed  and ever since and did fine s maint rx   until April 2021 with onset of cough refractory to all rx including  x for hydocodone so referred to pulmonary clinic 04/14/2020 by Dr   Sherryll Burger      History of Present Illness  04/14/2020  Pulmonary/ 1st office eval/[facility]an  S/p 2nd of moderna 04/02/20  Chief Complaint  Patient presents with   Pulmonary Consult    Referred by Dr Sherryll Burger. Pt c/o cough x 2 months. Cough is non prod and worse at night. She has found the only thing that has helped is hydrocodone cough syrup.   Dyspnea:  Only problem is heat - does fine in a/c including housework  Cough:worse at hs / dry sometimes choking  Sleep: bed is flat 2 pillows  SABA use: not helping cough  maint now on singulair / not using symbicort// already on gabapentin 300 tid and lyrica rec Valsartan 160-12.5 one daily instead of lotensin Add pepcid 20 mg one after supper  until return  GERD diet/ bedblocks For cough > hydrocodone up to a tsp every 4 hours as needed but should be better w/in 2 weeks Please schedule a follow up office visit in 6 weeks, call sooner if needed with all medications /inhalers/ solutions in hand so we can verify exactly what you are taking. This includes all medications from all doctors and over the counters    11/25/2020  f/u ov/[facility] office/[facility] Tran re:  Def doing much  better p last ov until 1st of 2022 eval by Sherryll Burger NP with nasal congestion / clear mucus / did not bring meds as requested Chief Complaint  Patient presents with   Follow-up    Nasal congestion, non productive cough  Dyspnea: was doing fine until onset flare 1s of the year  Cough: worse when puts head down/ noct cough and in am  Sleeping: bed is flat/ 2 pillows  SABA  use: hfa not helping/ neb helps  more  02: 2lpm hs and  Prn daytime Covid status:2nd pfizer 09/08/20  Lung cancer screening: does not meet age criteria rec Discuss stopping the inderal with your neurologist  Pantoprazole (protonix) 40 mg   Take  30-60 min before first meal of the day and Pepcid (famotidine)  20 mg one after supper until return to office - this is the best way to tell whether stomach acid is contributing to your problem.   GERD Augmentin 875 mg take one pill twice daily  X 10 days - take at breakfast and supper with large glass of water.  It would help reduce the usual side effects (diarrhea and yeast infections) if you ate cultured yogurt at lunch.  Prednisone 10 mg take  4 each am x 2 days,   2 each am x 2 days,  1 each am x 2 days and stop  For cough > hydrocodone up to a tsp every 4 hours as needed but should be better w/in 2 weeks Please remember to go to the lab department  > did not go  Please schedule a follow up office visit in 6 weeks, call sooner if needed with pfts on return > did not do  [facility] recs  01/01/21  Prednisone 10 mg tabs  Take 2 tabs daily with food x 5ds, then 1 tab daily with food x 5ds then STOP Hycodan cough syrup twice daily x 7 days STOP taking Lotensin HCT START taking Benicar HCTZ 20/12.5 daily x 30 x 1 refill   03/18/2021  f/u ov/[facility] office/[facility] re:  AB vs UACS  Chief Complaint  Patient presents with   Follow-up    No complaints currently   Dyspnea:  Limited by back/ R hip/ wt > doe walking with cane  Cough: minimal am mucus white assoc with rhinitis rx pred  Sleeping: bed blocks SABA use: duoneb not using  02: has it not using  Covid status: vax x 2  Lung cancer screening: n/a  Rec No change in recommendations  Ok to use duoneb if you must but not regularly  - call if use goes up  Please remember to go to the lab department @ [facility] for your tests when you go for your PFTs - we will call you with the results when  they are available. Reschedule the PFTs at [facility] and I will call you with the results    Admit date:     02/24/2022  Discharge date: 03/04/22  Discharge Physician: Beverly Tran        Recommendations at discharge:     Continue antibiotic therapy with linezolid to complete 4 weeks from 02/26/22, until 03/26/22. Hold on diuretic therapy Continue supplemental 02 per [facility]an at home.  Follow up as outpatient in 7 to 10 days with primary care.  Follow up with pulmonary to repeat chest imaging.    Discharge Diagnoses: Active Problems:   Empyema (HCC)   COPD (chronic obstructive pulmonary disease) (HCC)   Type 2 diabetes mellitus with hyperlipidemia (HCC)   Essential hypertension   Gastroesophageal reflux disease without esophagitis   Anxiety   Hypokalemia   Iron deficiency anemia   Class 2 obesity     Tran Course: Mrs. Beverly Tran was admitted to the [facility] with the working diagnosis of sepsis due to pneumonia, complicated with empyema.    51 yo female with the past medical history of COPD, T2DM, hypertension and obesity class 2 who presented with dyspnea. Initially admitted to community acquired pneumonia on 02/16/22, she had high oxygen requirements, and required non invasive mechanical ventilation. Her blood cultures were positive for MRSA. On 02/23/22 she left the Tran against medical advice. She return the following day with worsening dyspnea. At home 02 saturation was 77% on 6 L/min of supplemental 02 per [facility]an. She was in respiratory distress and placed back on Bipap, her blood pressure was 119/100, HR 101, RR 22, temp 101.3 and 02 saturation 86%, lungs with no wheezing, positive rales at the rifht side with decreased breath sounds, heart with S1 and S2 present and rhythmic, abdomen not distended and positive lower extremity edema.    Chest radiograph with right pleural effusion and bilateral lower lobes interstitial infiltrates.  CT chest with no pulmonary embolism,  worsening multifocal pneumonia with ground glass opacities of the left lower lobe and signs of necrotic pneumonia on the right lower lobe. Moderate loculated right pleural effusion.  Small left pleural effusion.    Patient was placed on broad spectrum antibiotic and pulmonary was consulted.  She was transferred from [facility] to [facility], for further management.  Chest tube was placed to the right pleural effusion and received fibrinolytic therapy with good toleration. 05/15 chest tube was removed.  Further work up with TEE was negative for vegetations, and antibiotic therapy was changed from IV vancomycin to oral linezolid to continue until 03/26/22, to complete 4 weeks from 02/26/22.    Oxygen requirements have improved, and patient will follow up as outpatient.          Assessment and Plan: Empyema (HCC) Sepsis complicated with MRSA bacteremia/ severe sepsis end organ damage hypoxemic respiratory failure. (present on admission).    Patient had a prolonged hospitalization, she was placed on broad spectrum antibiotic therapy.  Transferred from [facility] to [facility] for further management.    05/11 right thoracentesis too organized to drain.    05//13/23 chest tune was placed, 14 french pigtail catheter and connected to suction -20 cm H20.  05/14 started with pleural lytics.    Cultures were no growth, but she had positive MRSA bacteremia in [facility], 2 weeks prior while being treated for pneumonia (she left AMA on that occasion).    Antibiotic therapy was narrowed to IV vancomycin Patient with high risk for creating a bronchopleural fistula and not candidate for decortications.    TEE negative for vegetations. Plan to transition to linezolid to complete therapy on 03/26/22.    Acute hypoxemic respiratory failure, that has been improving.  At home patient will continue using supplemental 02 per [facility] and instructed to follow up as outpatient.       COPD (chronic obstructive pulmonary disease)  (HCC) No clinical signs of exacerbation Continue bronchodilator therapy and oxymetry monitoring.    Type 2 diabetes mellitus with hyperlipidemia (HCC) Her glucose remained stable, she was placed on insulin sliding scale during her hospitalization with good toleration.  At the time of her discharge she will resume metformin, and pioglitazone.    Essential hypertension Blood pressure has been stable. At the time of her discharge she will resume irbesartan, propanolol and amlodipine.   Echocardiogram with preserved LV systolic function, will hold on furosemide for now, instructions for close follow up as outpatient.    Gastroesophageal reflux disease without esophagitis Continue with antiacid therapy.    Anxiety Continue with clonidine 0.5 bid    Hypokalemia Electrolytes were corrected, at the time of her discharge her renal function is stable with serum cr at 0.41, K is 3,9 and serum bicarbonate at 32.    Plan to hold on diuretic therapy and follow up renal function as outpatient.    Iron deficiency anemia Continue with iron supplementation    Class 2 obesity Calculated BMI is 36.4    04/21/2022  post hosp f/u ov/[place] office/Shena Vinluan re: AB vs UACS  maint on symbicort 80 but not using  and on propranolol 10 mg daily  Chief Complaint  Patient presents with   Hospitalization Follow-up    Feels breathing is doing better since being in the Tran.   Is using 5LO2 pulse   Dyspnea:  "right much walking" inside house only for now/ came to office in w/c Cough: minmal mucoid  Sleeping: bed is flat Left side/ 2 pillow SABA use: neb qid  02: 5lpm hs  24/7     No obvious day to day or daytime variability or assoc excess/ purulent sputum or mucus plugs or hemoptysis or cp or chest tightness, subjective wheeze or overt sinus or hb symptoms.   Sleeping ok as above  without nocturnal  or early am exacerbation  of respiratory  c/o's or need for noct saba. Also denies any obvious  fluctuation of symptoms with weather or environmental  changes or other aggravating or alleviating factors except as outlined above   No unusual exposure hx or h/o childhood pna/ asthma or knowledge of premature birth.  Current Allergies, Complete Past Medical History, Past Surgical History, Family History, and Social History were reviewed in [employer] record.  ROS  The following are not active complaints unless bolded Hoarseness, sore throat, dysphagia, dental problems, itching, sneezing,  nasal congestion or discharge of excess mucus or purulent secretions, ear ache,   fever, chills, sweats, unintended wt loss or wt gain, classically pleuritic or exertional cp,  orthopnea pnd or arm/hand swelling  or leg swelling, presyncope, palpitations, abdominal pain, anorexia, nausea, vomiting, diarrhea  or change in bowel habits or change in bladder habits, change in stools or change in urine, dysuria, hematuria,  rash, arthralgias, visual complaints, headache, numbness, weakness or ataxia or problems with walking or coordination,  change in mood or  memory. Tremors variable on nebs qid         Current Meds  Medication Sig   benzonatate (TESSALON) 200 MG capsule 1 capsule   Cholecalciferol (VITAMIN D3) 10 MCG (400 UNIT) CAPS Take 400 Units by mouth daily.   clonazePAM (KLONOPIN) 0.5 MG tablet Take 0.5 mg by mouth 2 (two) times daily as needed.   cyclobenzaprine (FLEXERIL) 10 MG tablet Take 1 tablet by mouth every 8 (eight) hours as needed.   DULoxetine (CYMBALTA) 60 MG capsule Take 60 mg by mouth daily.   EPINEPHrine 0.3 mg/0.3 mL IJ SOAJ injection Inject 0.3 mg into the muscle as directed.   famotidine (PEPCID) 20 MG tablet TAKE ONE TABLET BY MOUTH AFTER SUPPER. (,EVENING)   FEROSUL 325 (65 Fe) MG tablet Take 325 mg by mouth 2 (two) times daily.   furosemide (LASIX) 40 MG tablet Take 1 tablet (40 mg total) by mouth daily as needed for fluid or edema (as needed for leg swelling or  worsening shortnes of breath.).   hydrOXYzine (ATARAX/VISTARIL) 25 MG tablet Take 25 mg by mouth daily as needed for anxiety.   ipratropium-albuterol (DUONEB) 0.5-2.5 (3) MG/3ML SOLN Take 3 mLs by nebulization every 4 (four) hours as needed (shortness of breath or wheezing).   irbesartan (AVAPRO) 150 MG tablet Take 1 tablet (150 mg total) by mouth daily.   levocetirizine (XYZAL) 5 MG tablet Take 10 mg by mouth at bedtime.   lidocaine (XYLOCAINE) 5 % ointment Apply topically.   meclizine (ANTIVERT) 25 MG tablet Take 25 mg by mouth 2 (two) times daily as needed for nausea.   metFORMIN (GLUCOPHAGE) 500 MG tablet Take 500-1,000 mg by mouth See admin instructions. Take 1000 mg by mouth in the morning and 500 mg in the evening   montelukast (SINGULAIR) 10 MG tablet Take 10 mg by mouth daily with supper.   naloxone (NARCAN) nasal spray 4 mg/0.1 mL 1 spray once.   olmesartan-hydrochlorothiazide (BENICAR HCT) 20-12.5 MG tablet TAKE ONE TABLET BY MOUTH ONCE DAILY (MORNING)   oxyCODONE-acetaminophen (PERCOCET) 10-325 MG tablet Take 1 tablet by mouth 3 (three) times daily as needed for moderate pain.   OZEMPIC, 1 MG/DOSE, 4 MG/3ML SOPN Inject 1 mg into the skin every 7 (seven) days.   pantoprazole (PROTONIX) 40 MG tablet TAKE ONE TABLET BY MOUTH DAILY (MORNING) (Patient taking differently: Take 40 mg by mouth daily.)   pioglitazone (ACTOS) 45 MG tablet Take 45 mg by mouth daily.   pramipexole (MIRAPEX) 1 MG tablet Take 1 mg by mouth at bedtime.   pregabalin (LYRICA) 300 MG  capsule 1 capsule   promethazine (PHENERGAN) 25 MG tablet TAKE ONE TABLET BY MOUTH EVERY 8 HOURS AS NEEDED FOR NAUSEA AND VOMITING   propranolol (INDERAL) 10 MG tablet Take 10 mg by mouth at bedtime.   QUEtiapine (SEROQUEL) 50 MG tablet Take 100 mg by mouth at bedtime.   rosuvastatin (CRESTOR) 10 MG tablet Take 10 mg by mouth daily.   SYMBICORT 80-4.5 MCG/ACT inhaler Inhale 2 puffs into the lungs daily.              Past Medical  History:  Diagnosis Date   Anxiety    Asthma    Back pain, chronic    Depression    High cholesterol    Hypertension    Migraine          Objective:    wts   04/21/2022         unable to stand   03/18/2021         219   11/25/20 213 lb 6.4 oz (96.8 kg)  04/14/20 210 lb (95.3 kg)  10/12/16 190 lb (86.2 kg)    Vital signs reviewed  04/21/2022  - Note at rest 02 sats  90% on 5lpm pulsed    General appearance:    w/c bound wf min resting tremor p am neb     HEENT : Oropharynx  clear      Nasal turbinates nl    NECK :  without  apparent JVD/ palpable Nodes/TM    LUNGS: no acc muscle use,  Nl contour chest which is clear to A and P bilaterally without cough on insp or exp maneuvers   CV:  RRR  no s3 or murmur or increase in P2, and no edema   ABD:  obese soft and nontender with limited  inspiratory excursion No bruits or organomegaly appreciated   MS:    ext warm without deformities Or obvious joint restrictions  calf tenderness, cyanosis or clubbing    SKIN: warm and dry without lesions    NEURO:  alert, approp, nl sensorium with  no motor or cerebellar deficits apparent.            I personally reviewed images and agree with radiology impression as follows:  CXR:   pa and lateral  04/01/22 Improving aeration at the lung bases, though with persisting reticulonodular opacities presumably residual inflammation/infection.   Trace right-sided pleural fluid/scarring    Assessment

## 2022-04-22 ENCOUNTER — Other Ambulatory Visit: Payer: Self-pay | Admitting: Gastroenterology

## 2022-04-22 ENCOUNTER — Encounter: Payer: Self-pay | Admitting: Internal Medicine

## 2022-04-22 DIAGNOSIS — J9611 Chronic respiratory failure with hypoxia: Secondary | ICD-10-CM | POA: Insufficient documentation

## 2022-04-22 NOTE — Assessment & Plan Note (Signed)
Onset was April 2021 / same time she quit smoking  - try off acei 04/14/2020 and added  pepcid 20 mg after supper - flare Oct 17 2020 assoc rhinitis? Sinusitis > rec augmentin/pred and cyclical cough rx  -Labs ordered 11/25/2020  :  allergy profile   alpha one AT phenotype  - 03/18/2021 much better off acei > f/u pfts ordered   - 04/21/2022  After extensive coaching inhaler device,  effectiveness =    75% > symbicort 80 2bid and minimize saba/ d/c propranolol (HA) if tolerates   Overall better x for possible effects of too much saba being partially blocked by inderol so rec ABC plan - see avs for instructions unique to this ov - goal is to minimize saba and d/c propranolol if tol and just use the low dose symbicort which may then be able to be weaned off Based on two studies from NEJM  378; 20 p 1865 (2018) and 380 : p2020-30 (2019) in pts with mild asthma it is reasonable to use low dose symbicort eg 80 2bid "prn" flare in this setting but I emphasized this was only shown with symbicort and takes advantage of the rapid onset of action but is not the same as "rescue therapy" but can be stopped once the acute symptoms have resolved and the need for rescue has been minimized (< 2 x weekly)

## 2022-04-22 NOTE — Assessment & Plan Note (Addendum)
HC03   03/04/22  = 32   She has consistently refused any form of cpap/bipap unless encephalopathic but doing surpringly well on just NP now  Advised: Make sure you check your oxygen saturation  AT  your highest level of activity (not after you stop)   to be sure it stays around  90% and adjust  02 flow upward to maintain this level if needed but remember to turn it back to previous settings when you stop (to conserve your supply).   F/u in 6 weeks with all meds in hand using a trust but verify approach to confirm accurate Medication  Reconciliation The principal here is that until we are certain that the  patients are doing what we've asked, it makes no sense to ask them to do more.           Each maintenance medication was reviewed in detail including emphasizing most importantly the difference between maintenance and prns and under what circumstances the prns are to be triggered using an action plan format where appropriate.  Total time for H and P, chart review, counseling, reviewing hfa/neb/02 device(s) and generating customized AVS unique to this office visit / same day charting = 36 min post hosp f/u ov

## 2022-04-22 NOTE — Assessment & Plan Note (Signed)
See admit 02/24/22 assoc with MRSA bacteremia req R chest tube/ paralytics  Marked continue clinical improvement since d/c and prolonged rx linezolid   F/u with cxr at next ov     .

## 2022-04-26 ENCOUNTER — Other Ambulatory Visit: Payer: Self-pay | Admitting: Pulmonary Disease

## 2022-05-04 ENCOUNTER — Other Ambulatory Visit (HOSPITAL_COMMUNITY): Payer: Self-pay | Admitting: Psychiatry

## 2022-05-04 DIAGNOSIS — M79675 Pain in left toe(s): Secondary | ICD-10-CM

## 2022-05-05 ENCOUNTER — Telehealth: Payer: Self-pay | Admitting: Family Medicine

## 2022-05-05 NOTE — Telephone Encounter (Signed)
Scheduled Mychart Video Visit for 05-12-22 at 1:00 with [facility].

## 2022-05-09 ENCOUNTER — Telehealth: Payer: Medicaid Other | Admitting: Family Medicine

## 2022-05-09 DIAGNOSIS — J9611 Chronic respiratory failure with hypoxia: Secondary | ICD-10-CM

## 2022-05-09 DIAGNOSIS — E8809 Other disorders of plasma-protein metabolism, not elsewhere classified: Secondary | ICD-10-CM

## 2022-05-09 DIAGNOSIS — J449 Chronic obstructive pulmonary disease, unspecified: Secondary | ICD-10-CM
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
## 2022-05-11 ENCOUNTER — Encounter: Payer: Self-pay | Admitting: Family Medicine

## 2022-05-12 ENCOUNTER — Telehealth: Payer: Self-pay | Admitting: Internal Medicine

## 2022-05-12 NOTE — Telephone Encounter (Signed)
Called and spoke with patient. She stated that her cough has gotten worse over the past 3 days. When the cough started, she was able to cough up very small amounts of clear phlegm. Now she is not able to cough anything up. Denied any increased wheezing, fever, chest pain or SOB.   She is still using her Symbicort twice daily. She hardly ever uses the albuterol. Her PCP prescribed Tessalon perles but they have not helped at all.   She has not been using her O2 because her O2 sats have been in the lower 90s on room air.   She wanted to know if MW would be willing to prescribe Hydromet (its what her insurance will cover). [facility]  MW, can you please advise? Thanks!

## 2022-05-12 NOTE — Telephone Encounter (Signed)
On way too many medications to attempt treatment over the phone   Can use mucinex dm 1200mg  twice daily and if has any oxycodone left that can be used to supplement the mucinex dm    I can see next week in [place] as add on or let her see one of the other pulmonary providers if need be but either way needs to bring all active meds with her to avoid confusion re her meds

## 2022-05-12 NOTE — Telephone Encounter (Signed)
Spoke with pt and scheduled for OV on 05/19/22 in [place] with Dr. Sherene Sires. Nothing further needed at this time. Pt was instructed to bring all medications to OV.

## 2022-05-16 ENCOUNTER — Telehealth: Payer: Self-pay | Admitting: Internal Medicine

## 2022-05-16 DIAGNOSIS — J9611 Chronic respiratory failure with hypoxia: Secondary | ICD-10-CM

## 2022-05-16 NOTE — Telephone Encounter (Signed)
ATC patient.  LMTCB. 

## 2022-05-18 NOTE — Telephone Encounter (Signed)
ATC patient. LMTCB   Dr. Sherene Sires per PCCs, [facility] does not do best fit evals for POC

## 2022-05-18 NOTE — Telephone Encounter (Signed)
She needs a best fit eval for portable 02 but I doubt a POC will keep her high enough to qualify

## 2022-05-18 NOTE — Telephone Encounter (Signed)
Order placed for POC to [facility] for up to 6lpm.

## 2022-05-18 NOTE — Telephone Encounter (Signed)
Called patient and she states she has been on portable O2 since last year. Beverly Tran from [facility] needs an order since portable tanks are too heavy for her to carry. She states she is not supposed to lift anything over 5 pounds. Was using 5LO2 cont. But is now using 4LO2 cont. Wants to know if we can send in an order for a POC.    Dr. Sherene Sires please advise, is it ok to place order for a POC to [facility]?

## 2022-05-18 NOTE — Telephone Encounter (Signed)
We can order it then and see they will provide it s documentation that it will work adequately but I suspect they will need a qualifying test and best done in the [place] office if we don't have loaner here or she can always purchase one on her own but it has to go to 6lpm   with goal to keep > 90% at all times but esp with walking

## 2022-05-19 ENCOUNTER — Ambulatory Visit: Payer: 59 | Admitting: Internal Medicine

## 2022-05-20 NOTE — Telephone Encounter (Signed)
Called and spoke to patient and she states that she would like order sent to a new DME. I did notify her of Dr. Rolin Barry previous response and told her that most DMEs are going to require a walk before supplying O2. Offered to do this at her next ov and she was agreeable to that plan. Nothing further needed for now. Will put in pts appt notes that she needs a walk when she comes in to office.

## 2022-05-23 ENCOUNTER — Telehealth: Payer: Self-pay | Admitting: Internal Medicine

## 2022-05-23 NOTE — Telephone Encounter (Signed)
I called and spoke with the pt and let her know of Dr Thurston Hole response. She states unable to come to [place] location and can only travel to [place]. There are no openings this wk there. There are a couple of blocked spots for tomorrow 05/24/22- Dr Craige Cotta, could pt come and see you in one of your held slots about her cough?

## 2022-05-23 NOTE — Telephone Encounter (Signed)
Spoke with the pt  She c/o increased cough x 1 wk  She states "it's the common cold"  She denies any nasal symptoms, sore throat, wheezing, f/c/s, increased SOB  She states that she is using robitussion, tessalon, symbicort, pepcid, omeprazole, singulair, xyzal  She is asking for rx for hycodan  She is scheduled to see Dr Sherene Sires again on 06/28/33 Please advise, thanks!  Allergies  Allergen Reactions   Diclofenac Anaphylaxis   Salami [Pickled Meat] Shortness Of Breath and Swelling    Also Allergic to POPCORN: same reaction   Aspirin Other (See Comments)    Nose bleeds   Bee Venom Swelling    WASP/HORNET   Compazine [Prochlorperazine] Hives   Imitrex [Sumatriptan] Hives   Lamotrigine Other (See Comments)    confusion   Lisinopril     Other reaction(s): Unknown   Nubain [Nalbuphine Hcl] Swelling and Other (See Comments)    Eye swelling   Thorazine [Chlorpromazine] Hives   Topamax [Topiramate] Other (See Comments)    Confusion and out of it   Voltaren [Diclofenac Sodium] Swelling   Zofran [Ondansetron Hcl] Nausea Only

## 2022-05-23 NOTE — Telephone Encounter (Signed)
Will need ov with all meds in hand to regroup - ok to add Friday 8/11

## 2022-05-24 ENCOUNTER — Ambulatory Visit: Payer: 59 | Admitting: Internal Medicine

## 2022-05-24 NOTE — Telephone Encounter (Signed)
Good morning Megan, I do not see any openings in Dr. Evlyn Courier schedule for this week and he is only here in the am this week.  Can you find out where he would like [place] to put this patient so we can schedule him please?  Thank you.

## 2022-05-24 NOTE — Telephone Encounter (Signed)
05/24/22 won't work.  Can schedule her later in the week.

## 2022-05-24 NOTE — Telephone Encounter (Signed)
Patient spoke to [place] with front office and states she cannot come in tomorrow and will call back when she has transportation to come in. Nothing further needed at this time.

## 2022-05-24 NOTE — Telephone Encounter (Signed)
Called and spoke to patient. Offered Thursday afternoon appt and patient states she doesn't know if she can make it to that appt because of transportation. States she has an appt with neurology in [place] tomorrow at 12 and wanted to know if we had any openings on 8/9 in the am. Had a cancellation/open slot for 9:45. Offered to patient and she states she will call back after she checks with her friend who brings her to her appointments. Advised patient to call within an hour to confirm if she wants appt.

## 2022-05-24 NOTE — Telephone Encounter (Signed)
Per Dr. Craige Cotta, we can schedule patient for Thursday 8/10 in the afternoon in [place].  ATC patient to ask if this would work. LMTCB

## 2022-05-25 ENCOUNTER — Ambulatory Visit: Payer: 59 | Admitting: Pulmonary Disease

## 2022-05-25 DIAGNOSIS — G253 Myoclonus: Secondary | ICD-10-CM | POA: Diagnosis not present

## 2022-05-25 DIAGNOSIS — M1990 Unspecified osteoarthritis, unspecified site: Secondary | ICD-10-CM | POA: Diagnosis not present

## 2022-05-25 DIAGNOSIS — I951 Orthostatic hypotension: Secondary | ICD-10-CM | POA: Diagnosis not present

## 2022-05-25 DIAGNOSIS — M461 Sacroiliitis, not elsewhere classified: Secondary | ICD-10-CM | POA: Diagnosis not present

## 2022-05-25 DIAGNOSIS — M5416 Radiculopathy, lumbar region: Secondary | ICD-10-CM | POA: Diagnosis not present

## 2022-05-25 DIAGNOSIS — Z79891 Long term (current) use of opiate analgesic: Secondary | ICD-10-CM | POA: Diagnosis not present

## 2022-05-26 DIAGNOSIS — M25561 Pain in right knee: Secondary | ICD-10-CM | POA: Diagnosis not present

## 2022-05-26 DIAGNOSIS — J069 Acute upper respiratory infection, unspecified: Secondary | ICD-10-CM | POA: Diagnosis not present

## 2022-05-27 DIAGNOSIS — F329 Major depressive disorder, single episode, unspecified: Secondary | ICD-10-CM | POA: Diagnosis not present

## 2022-05-27 DIAGNOSIS — R69 Illness, unspecified: Secondary | ICD-10-CM | POA: Diagnosis not present

## 2022-05-30 DIAGNOSIS — R296 Repeated falls: Secondary | ICD-10-CM | POA: Diagnosis not present

## 2022-05-30 DIAGNOSIS — R69 Illness, unspecified: Secondary | ICD-10-CM | POA: Diagnosis not present

## 2022-05-30 DIAGNOSIS — M1711 Unilateral primary osteoarthritis, right knee: Secondary | ICD-10-CM | POA: Diagnosis not present

## 2022-05-31 ENCOUNTER — Encounter: Payer: Self-pay | Admitting: Family Medicine

## 2022-05-31 ENCOUNTER — Other Ambulatory Visit: Payer: Medicaid Other | Admitting: Family Medicine

## 2022-05-31 ENCOUNTER — Other Ambulatory Visit: Payer: Self-pay | Admitting: Gastroenterology

## 2022-05-31 VITALS — BP 108/70 | HR 91 | Resp 18

## 2022-05-31 DIAGNOSIS — F4321 Adjustment disorder with depressed mood: Secondary | ICD-10-CM

## 2022-05-31 DIAGNOSIS — J9611 Chronic respiratory failure with hypoxia: Secondary | ICD-10-CM

## 2022-05-31 DIAGNOSIS — J449 Chronic obstructive pulmonary disease, unspecified: Secondary | ICD-10-CM

## 2022-05-31 DIAGNOSIS — E8809 Other disorders of plasma-protein metabolism, not elsewhere classified: Secondary | ICD-10-CM
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
## 2022-06-01 ENCOUNTER — Encounter: Payer: Self-pay | Admitting: Family Medicine

## 2022-06-01 ENCOUNTER — Telehealth: Payer: Self-pay | Admitting: Internal Medicine

## 2022-06-01 DIAGNOSIS — F4321 Adjustment disorder with depressed mood: Secondary | ICD-10-CM | POA: Insufficient documentation

## 2022-06-01 NOTE — Telephone Encounter (Signed)
Patient states her insurance will no longer cover symbicort. She states she has called the insurance and asked what inhalers they cover  and they suggest we send in an alternative inhaler for her. PA team is this something you can help [place] with in regards to finding an alternative inhaler that is covered by patients insurance?    Also spoke to patient and reminded her that last time we discussed POC we agreed to wait until her next ov when we can do a walk test and she voiced understanding and was agreeable to this.

## 2022-06-02 ENCOUNTER — Ambulatory Visit: Payer: 59 | Admitting: Internal Medicine

## 2022-06-06 ENCOUNTER — Other Ambulatory Visit: Payer: Medicaid Other

## 2022-06-06 ENCOUNTER — Ambulatory Visit (HOSPITAL_COMMUNITY): Payer: 59

## 2022-06-06 ENCOUNTER — Other Ambulatory Visit (HOSPITAL_COMMUNITY): Payer: Self-pay

## 2022-06-06 DIAGNOSIS — Z515 Encounter for palliative care: Secondary | ICD-10-CM

## 2022-06-06 NOTE — Telephone Encounter (Signed)
Patient previous insurance ([employer]) ended coverage on 05/16/22.  Test billing for new coverage returns the following co-pays for ICS/LABA products:   Advair Diskus - $0  Advair HFA - non-formulary  Breo - $50  Dulera - $50  Symbicort - $0   Dr. Sherene Sires please advise

## 2022-06-06 NOTE — Progress Notes (Signed)
COMMUNITY PALLIATIVE CARE SW NOTE  PATIENT NAME: Beverly Tran DOB: 1970/12/29 MRN: [id number]  PRIMARY CARE PROVIDER: Shelby Dubin, FNP  RESPONSIBLE PARTY:  Acct ID - Guarantor Home Phone Work Phone Relationship Acct Type  [id number] Beverly Tran, Beverly Tran* [phone]  Self P/F     [address], [place], [place] [address]   SOCIAL WORK TELEPHONIC ENCOUNTER  PC SW completed a telephonic encounter with patient to provide resources and support to her. Patient was needed assistance with obtaining Glucerna and grief counseling, and SW was requested via palliative NP-Karen Highfill. SW was provided local resources such as food banks in her area that she could check into. The patient also shared that she is still experiencing grief related her mother-in-law and her son. SW provided education regarding [facility], which she declined. Patient advised that she was has counselor and psychiatrist that she is happy with. SW reinforced ongoing support when needed. Patient thanked SW for the call and resources.  No other concerns noted.         [address] [place], [place]

## 2022-06-06 NOTE — Telephone Encounter (Signed)
Looks like they will cover symbicort 80 no?  Maybe they cover the 160  >  either is fine

## 2022-06-06 NOTE — Telephone Encounter (Signed)
Since it's a narcotic no can do until returns with all active meds in hand to regroup

## 2022-06-06 NOTE — Telephone Encounter (Signed)
Called and spoke to patient and she was made aware that on our end that copay is showing $0.  She voiced understanding.   Patient asked if we could send. Dr. Sherene Sires a message asking if he could send enough hydromet until she sees Dr. Sherene Sires on 9/12 for her cough. She states she cannot come in for a sooner appt because of transportation. I did advise patient that last phone note, Dr. Sherene Sires said pt had to have an ov first. She voiced understanding but asks that I send message anyway.   Please advise

## 2022-06-07 DIAGNOSIS — M25561 Pain in right knee: Secondary | ICD-10-CM | POA: Diagnosis not present

## 2022-06-07 DIAGNOSIS — R059 Cough, unspecified: Secondary | ICD-10-CM | POA: Diagnosis not present

## 2022-06-07 NOTE — Telephone Encounter (Signed)
Called and notified patient and she is aware of Dr. Rolin Barry response. She is going to follow up with PCP. Nothing further needed.

## 2022-06-08 ENCOUNTER — Ambulatory Visit: Payer: 59 | Admitting: Gastroenterology

## 2022-06-09 ENCOUNTER — Other Ambulatory Visit: Payer: Self-pay | Admitting: Family Medicine

## 2022-06-09 ENCOUNTER — Other Ambulatory Visit (HOSPITAL_COMMUNITY): Payer: Self-pay | Admitting: Family Medicine

## 2022-06-09 DIAGNOSIS — M25561 Pain in right knee: Secondary | ICD-10-CM

## 2022-06-11 ENCOUNTER — Other Ambulatory Visit: Payer: Self-pay | Admitting: Gastroenterology

## 2022-06-13 DIAGNOSIS — R69 Illness, unspecified: Secondary | ICD-10-CM | POA: Diagnosis not present

## 2022-06-21 ENCOUNTER — Ambulatory Visit: Payer: 59 | Admitting: Gastroenterology

## 2022-06-22 DIAGNOSIS — R569 Unspecified convulsions: Secondary | ICD-10-CM | POA: Diagnosis not present

## 2022-06-23 ENCOUNTER — Other Ambulatory Visit (HOSPITAL_COMMUNITY): Payer: Self-pay | Admitting: Family Medicine

## 2022-06-23 ENCOUNTER — Other Ambulatory Visit: Payer: Self-pay | Admitting: Family Medicine

## 2022-06-23 DIAGNOSIS — R29898 Other symptoms and signs involving the musculoskeletal system: Secondary | ICD-10-CM

## 2022-06-23 DIAGNOSIS — R479 Unspecified speech disturbances: Secondary | ICD-10-CM

## 2022-06-27 ENCOUNTER — Other Ambulatory Visit: Payer: Self-pay | Admitting: Gastroenterology

## 2022-06-27 DIAGNOSIS — R69 Illness, unspecified: Secondary | ICD-10-CM | POA: Diagnosis not present

## 2022-06-28 ENCOUNTER — Telehealth: Payer: Self-pay | Admitting: Internal Medicine

## 2022-06-28 ENCOUNTER — Ambulatory Visit (INDEPENDENT_AMBULATORY_CARE_PROVIDER_SITE_OTHER): Payer: 59 | Admitting: Internal Medicine

## 2022-06-28 ENCOUNTER — Encounter: Payer: Self-pay | Admitting: Internal Medicine

## 2022-06-28 ENCOUNTER — Ambulatory Visit (HOSPITAL_COMMUNITY)
Admission: RE | Admit: 2022-06-28 | Discharge: 2022-06-28 | Disposition: A | Discharge status: Home / Self Care | Payer: 59 | Source: Ambulatory Visit | Attending: Internal Medicine | Admitting: Internal Medicine

## 2022-06-28 DIAGNOSIS — R079 Chest pain, unspecified: Secondary | ICD-10-CM | POA: Diagnosis not present

## 2022-06-28 DIAGNOSIS — J9612 Chronic respiratory failure with hypercapnia: Secondary | ICD-10-CM | POA: Diagnosis not present

## 2022-06-28 DIAGNOSIS — J869 Pyothorax without fistula: Secondary | ICD-10-CM

## 2022-06-28 DIAGNOSIS — R058 Other specified cough: Secondary | ICD-10-CM

## 2022-06-28 DIAGNOSIS — R0602 Shortness of breath: Secondary | ICD-10-CM | POA: Diagnosis not present

## 2022-06-28 DIAGNOSIS — J9611 Chronic respiratory failure with hypoxia: Secondary | ICD-10-CM | POA: Diagnosis not present

## 2022-06-28 MED ORDER — PREDNISONE 10 MG PO TABS: ORAL_TABLET | ORAL | 0 refills | Status: DC

## 2022-06-28 MED ORDER — CHLORPHENIRAMINE MALEATE 4 MG PO TABS: 4.0000 mg | ORAL_TABLET | Freq: Two times a day (BID) | ORAL | 2 refills | Status: DC | PRN

## 2022-06-28 NOTE — Telephone Encounter (Signed)
See other note. I have placed the order to [facility] pharmacy. Nothing further needed.

## 2022-06-28 NOTE — Patient Instructions (Addendum)
Bed blockers (risers) 6-8 inches under head board  Prednisone 10 mg take  4 each am x 2 days,   2 each am x 2 days,  1 each am x 2 days and stop   Work on inhaler technique:  relax and gently blow all the way out then take a nice smooth full deep breath back in, triggering the inhaler at same time you start breathing in.  Hold breath in for at least  5 seconds if you can. Blow out symbicort  thru nose. Rinse and gargle with water when done.  If mouth or throat bother you at all,  try brushing teeth/gums/tongue with arm and hammer toothpaste/ make a slurry and gargle and spit out.       For drainage / throat tickle at bed time stop xyzal  try take CHLORPHENIRAMINE  4 mg  ("Allergy Relief" 4mg   at [facility] should be easiest to find in the blue box usually on bottom shelf)  take one every 4 hours as needed - extremely effective and inexpensive over the counter- may cause drowsiness so start with just a dose or two an hour before bedtime and see how you tolerate it before trying in daytime.   Please remember to go to the lab department   for your tests - we will call you with the results when they are available.       Please schedule a follow up visit in 3 months but call sooner if needed

## 2022-06-28 NOTE — Progress Notes (Unsigned)
Beverly Tran, female    DOB: 1970/12/19     MRN: [id number]   Brief patient profile:  51 yowf quit smoking 01/2020  "born with bronchitis"  (mother smoked)  On "breathing pill they quit making" then around age 51 changed to  inhalers as needed  and ever since and did fine s maint rx   until April 2021 with onset of cough refractory to all rx including  x for hydocodone so referred to pulmonary clinic 04/14/2020 by Dr   Beverly Tran      History of Present Illness  04/14/2020  Pulmonary/ 1st office eval/Beverly Tran  S/p 2nd of moderna 04/02/20  Chief Complaint  Patient presents with   Pulmonary Consult    Referred by Dr Beverly Tran. Pt c/o cough x 2 months. Cough is non prod and worse at night. She has found the only thing that has helped is hydrocodone cough syrup.   Dyspnea:  Only problem is heat - does fine in a/c including housework  Cough:worse at hs / dry sometimes choking  Sleep: bed is flat 2 pillows  SABA use: not helping cough  maint now on singulair / not using symbicort// already on gabapentin 300 tid and lyrica rec Valsartan 160-12.5 one daily instead of lotensin Add pepcid 20 mg one after supper  until return  GERD diet/ bedblocks For cough > hydrocodone up to a tsp every 4 hours as needed but should be better w/in 2 weeks Please schedule a follow up office visit in 6 weeks, call sooner if needed with all medications /inhalers/ solutions in hand so we can verify exactly what you are taking. This includes all medications from all doctors and over the counters    11/25/2020  f/u ov/[place] office/Beverly Tran re:  Def doing much  better p last ov until 1st of 2022 eval by Beverly Burger NP with nasal congestion / clear mucus / did not bring meds as requested Chief Complaint  Patient presents with   Follow-up    Nasal congestion, non productive cough  Dyspnea: was doing fine until onset flare 1s of the year  Cough: worse when puts head down/ noct cough and in am  Sleeping: bed is flat/ 2 pillows  SABA  use: hfa not helping/ neb helps  more  02: 2lpm hs and  Prn daytime Covid status:2nd pfizer 09/08/20  Lung cancer screening: does not meet age criteria rec Discuss stopping the inderal with your neurologist  Pantoprazole (protonix) 40 mg   Take  30-60 min before first meal of the day and Pepcid (famotidine)  20 mg one after supper until return to office - this is the best way to tell whether stomach acid is contributing to your problem.   GERD Augmentin 875 mg take one pill twice daily  X 10 days - take at breakfast and supper with large glass of water.  It would help reduce the usual side effects (diarrhea and yeast infections) if you ate cultured yogurt at lunch.  Prednisone 10 mg take  4 each am x 2 days,   2 each am x 2 days,  1 each am x 2 days and stop  For cough > hydrocodone up to a tsp every 4 hours as needed but should be better w/in 2 weeks Please remember to go to the lab department  > did not go  Please schedule a follow up office visit in 6 weeks, call sooner if needed with pfts on return > did not do  Beverly Tran recs  01/01/21  Prednisone 10 mg tabs  Take 2 tabs daily with food x 5ds, then 1 tab daily with food x 5ds then STOP Hycodan cough syrup twice daily x 7 days STOP taking Lotensin HCT START taking Benicar HCTZ 20/12.5 daily x 30 x 1 refill   03/18/2021  f/u ov/[place] office/Beverly Tran re:  AB vs UACS  Chief Complaint  Patient presents with   Follow-up    No complaints currently   Dyspnea:  Limited by back/ R hip/ wt > doe walking with cane  Cough: minimal am mucus white assoc with rhinitis rx pred  Sleeping: bed blocks SABA use: duoneb not using  02: has it not using  Covid status: vax x 2  Lung cancer screening: n/a  Rec No change in recommendations  Ok to use duoneb if you must but not regularly  - call if use goes up  Please remember to go to the lab department @ [facility] for your tests when you go for your PFTs - we will call you with the results when  they are available. Reschedule the PFTs at [facility] and I will call you with the results    Admit date:     02/24/2022  Discharge date: 03/04/22  Discharge Physician: Beverly Tran        Recommendations at discharge:     Continue antibiotic therapy with linezolid to complete 4 weeks from 02/26/22, until 03/26/22. Hold on diuretic therapy Continue supplemental 02 per Beverly Tran at home.  Follow up as outpatient in 7 to 10 days with primary care.  Follow up with pulmonary to repeat chest imaging.    Discharge Diagnoses: Active Problems:   Empyema (HCC)   COPD (chronic obstructive pulmonary disease) (HCC)   Type 2 diabetes mellitus with hyperlipidemia (HCC)   Essential hypertension   Gastroesophageal reflux disease without esophagitis   Anxiety   Hypokalemia   Iron deficiency anemia   Class 2 obesity     Hospital Course: Beverly Tran was admitted to the hospital with the working diagnosis of sepsis due to pneumonia, complicated with empyema.    51 yo female with the past medical history of COPD, T2DM, hypertension and obesity class 2 who presented with dyspnea. Initially admitted to community acquired pneumonia on 02/16/22, she had high oxygen requirements, and required non invasive mechanical ventilation. Her blood cultures were positive for MRSA. On 02/23/22 she left the hospital against medical advice. She return the following day with worsening dyspnea. At home 02 saturation was 77% on 6 L/min of supplemental 02 per Beverly Tran. She was in respiratory distress and placed back on Bipap, her blood pressure was 119/100, HR 101, RR 22, temp 101.3 and 02 saturation 86%, lungs with no wheezing, positive rales at the rifht side with decreased breath sounds, heart with S1 and S2 present and rhythmic, abdomen not distended and positive lower extremity edema.    Chest radiograph with right pleural effusion and bilateral lower lobes interstitial infiltrates.  CT chest with no pulmonary embolism,  worsening multifocal pneumonia with ground glass opacities of the left lower lobe and signs of necrotic pneumonia on the right lower lobe. Moderate loculated right pleural effusion.  Small left pleural effusion.    Patient was placed on broad spectrum antibiotic and pulmonary was consulted.  She was transferred from [facility] to [facility], for further management.  Chest tube was placed to the right pleural effusion and received fibrinolytic therapy with good toleration. 05/15 chest tube was removed.  Further work up with TEE was negative for vegetations, and antibiotic therapy was changed from IV vancomycin to oral linezolid to continue until 03/26/22, to complete 4 weeks from 02/26/22.    Oxygen requirements have improved, and patient will follow up as outpatient.          Assessment and Plan: Empyema ([place]) Sepsis complicated with MRSA bacteremia/ severe sepsis end organ damage hypoxemic respiratory failure. (present on admission).    Patient had a prolonged hospitalization, she was placed on broad spectrum antibiotic therapy.  Transferred from [facility] to [facility] for further management.    05/11 right thoracentesis too organized to drain.    05//13/23 chest tune was placed, 14 french pigtail catheter and connected to suction -20 cm H20.  05/14 started with pleural lytics.    Cultures were no growth, but she had positive MRSA bacteremia in [facility], 2 weeks prior while being treated for pneumonia (she left AMA on that occasion).    Antibiotic therapy was narrowed to IV vancomycin Patient with high risk for creating a bronchopleural fistula and not candidate for decortications.    TEE negative for vegetations. Plan to transition to linezolid to complete therapy on 03/26/22.    Acute hypoxemic respiratory failure, that has been improving.  At home patient will continue using supplemental 02 per [place] and instructed to follow up as outpatient.       COPD (chronic obstructive pulmonary disease)  (HCC) No clinical signs of exacerbation Continue bronchodilator therapy and oxymetry monitoring.    Type 2 diabetes mellitus with hyperlipidemia (HCC) Her glucose remained stable, she was placed on insulin sliding scale during her hospitalization with good toleration.  At the time of her discharge she will resume metformin, and pioglitazone.    Essential hypertension Blood pressure has been stable. At the time of her discharge she will resume irbesartan, propanolol and amlodipine.   Echocardiogram with preserved LV systolic function, will hold on furosemide for now, instructions for close follow up as outpatient.    Gastroesophageal reflux disease without esophagitis Continue with antiacid therapy.    Anxiety Continue with clonidine 0.5 bid    Hypokalemia Electrolytes were corrected, at the time of her discharge her renal function is stable with serum cr at 0.41, K is 3,9 and serum bicarbonate at 32.    Plan to hold on diuretic therapy and follow up renal function as outpatient.    Iron deficiency anemia Continue with iron supplementation    Class 2 obesity Calculated BMI is 36.4    04/21/2022  post hosp f/u ov/[place] office/Beverly Tran re: AB vs UACS  maint on symbicort 80 but not using  and on propranolol 10 mg daily  Chief Complaint  Patient presents with   Hospitalization Follow-up    Feels breathing is doing better since being in the hospital.   Is using 5LO2 pulse   Dyspnea:  "right much walking" inside house only for now/ came to office in w/c Cough: minmal mucoid  Sleeping: bed is flat Left side/ 2 pillow SABA use: neb qid  02: 5lpm hs  24/7  Rec Try off the propranolol to see what diffence if any it makes  Plan A = Automatic = Always=    symbicort 80 Take 2 puffs first thing in am and then another 2 puffs about 12 hours later.   Work on inhaler technique:  - practice with the empty inhaler I gave you today  Plan B = Backup (to supplement plan A, not to replace  it) Only use your albuterol inhaler as a rescue medication Plan C = Crisis (instead of Plan B but only if Plan B stops working) - only use your albuterol nebulizer if you first try Plan B and it fails to help > ok to use the nebulizer up to every 4 hours but if start needing it regularly call for immediate appointment Make sure you check your oxygen saturation  AT  your highest level of activity (not after you stop)   to be sure it stays over 90%       06/28/2022  f/u ov/[place] office/Beverly Tran re: AB maint on symbicort   Chief Complaint  Patient presents with   Follow-up    She is doing well. She had her CXR.   Dyspnea:  walked across street for cxr s 02  Cough: minimal / some hs  Sleeping: flat bed/ L side  SABA use: none 02: 3lpm hs  Covid status: vax x 4      No obvious day to day or daytime variability or assoc excess/ purulent sputum or mucus plugs or hemoptysis or cp or chest tightness, subjective wheeze or overt sinus or hb symptoms.   Sleeping  without nocturnal  or early am exacerbation  of respiratory  c/o's or need for noct saba. Also denies any obvious fluctuation of symptoms with weather or environmental changes or other aggravating or alleviating factors except as outlined above   No unusual exposure hx or h/o childhood pna/ asthma or knowledge of premature birth.  Current Allergies, Complete Past Medical History, Past Surgical History, Family History, and Social History were reviewed in [employer] record.  ROS  The following are not active complaints unless bolded Hoarseness, sore throat, dysphagia, dental problems, itching, sneezing,  nasal congestion or discharge of excess mucus or purulent secretions, ear ache,   fever, chills, sweats, unintended wt loss or wt gain, classically pleuritic or exertional cp,  orthopnea pnd or arm/hand swelling  or leg swelling, presyncope, palpitations, abdominal pain, anorexia, nausea, vomiting, diarrhea  or  change in bowel habits or change in bladder habits, change in stools or change in urine, dysuria, hematuria,  rash, arthralgias, visual complaints, headache, numbness, weakness or ataxia or problems with walking or coordination,  change in mood or  memory.        Current Meds  Medication Sig   albuterol (VENTOLIN HFA) 108 (90 Base) MCG/ACT inhaler Inhale 2 puffs into the lungs every 6 (six) hours as needed for wheezing or shortness of breath.   benzonatate (TESSALON) 200 MG capsule 1 capsule   Cholecalciferol (VITAMIN D3) 10 MCG (400 UNIT) CAPS Take 400 Units by mouth daily.   clonazePAM (KLONOPIN) 0.5 MG tablet Take 0.5 mg by mouth 2 (two) times daily as needed.   cyclobenzaprine (FLEXERIL) 10 MG tablet Take 1 tablet by mouth every 8 (eight) hours as needed.   divalproex (DEPAKOTE ER) 250 MG 24 hr tablet Take 250 mg by mouth daily. Take 1 in am and 2 in pm.   Dulaglutide (TRULICITY) 0.75 MG/0.5ML SOPN Inject 0.75 mg into the skin.   DULoxetine (CYMBALTA) 60 MG capsule Take 60 mg by mouth daily. Takes 30 in am and 60 mg at night   EPINEPHrine 0.3 mg/0.3 mL IJ SOAJ injection Inject 0.3 mg into the muscle as directed.   famotidine (PEPCID) 20 MG tablet TAKE ONE TABLET BY MOUTH AFTER SUPPER (,EVENING)   FEROSUL 325 (65 Fe) MG tablet Take 325 mg by mouth 2 (two)  times daily.   furosemide (LASIX) 40 MG tablet Take 1 tablet (40 mg total) by mouth daily as needed for fluid or edema (as needed for leg swelling or worsening shortnes of breath.).   gabapentin (NEURONTIN) 600 MG tablet Take 600 mg by mouth 3 (three) times daily.   hydrOXYzine (ATARAX/VISTARIL) 25 MG tablet Take 25 mg by mouth daily as needed for anxiety.   ipratropium-albuterol (DUONEB) 0.5-2.5 (3) MG/3ML SOLN Take 3 mLs by nebulization every 4 (four) hours as needed (shortness of breath or wheezing).   levocetirizine (XYZAL) 5 MG tablet Take 10 mg by mouth at bedtime.   lidocaine (XYLOCAINE) 5 % ointment Apply topically.   meclizine  (ANTIVERT) 25 MG tablet Take 25 mg by mouth 2 (two) times daily as needed for nausea.   metFORMIN (GLUCOPHAGE) 500 MG tablet Take 500 mg by mouth 2 (two) times daily with a meal.   montelukast (SINGULAIR) 10 MG tablet Take 10 mg by mouth daily with supper.   naloxone (NARCAN) nasal spray 4 mg/0.1 mL 1 spray once.   olmesartan-hydrochlorothiazide (BENICAR HCT) 20-12.5 MG tablet TAKE ONE TABLET BY MOUTH ONCE DAILY (MORNING)   oxyCODONE-acetaminophen (PERCOCET) 10-325 MG tablet Take 1 tablet by mouth 3 (three) times daily as needed for moderate pain.   OZEMPIC, 1 MG/DOSE, 4 MG/3ML SOPN Inject 2 mg into the skin every 7 (seven) days.   pantoprazole (PROTONIX) 40 MG tablet TAKE ONE TABLET BY MOUTH DAILY (MORNING) (Patient taking differently: Take 40 mg by mouth daily.)   pioglitazone (ACTOS) 45 MG tablet Take 45 mg by mouth daily.   pramipexole (MIRAPEX) 1 MG tablet Take 1.5 mg by mouth at bedtime.   promethazine (PHENERGAN) 25 MG tablet TAKE ONE TABLET BY MOUTH EVERY 8 HOURS AS NEEDED FOR NAUSEA AND VOMITING   rosuvastatin (CRESTOR) 10 MG tablet Take 10 mg by mouth daily.   SYMBICORT 80-4.5 MCG/ACT inhaler Take 2 puffs first thing in am and then another 2 puffs about 12 hours later.               Past Medical History:  Diagnosis Date   Anxiety    Asthma    Back pain, chronic    Depression    High cholesterol    Hypertension    Migraine          Objective:    wts   06/28/2022       216  04/21/2022         unable to stand   03/18/2021         219   11/25/20 213 lb 6.4 oz (96.8 kg)  04/14/20 210 lb (95.3 kg)  10/12/16 190 lb (86.2 kg)    Vital signs reviewed  06/28/2022  - Note at rest 02 sats  96% on RA   General appearance:    amb with rollator nad      HEENT : Oropharynx  clear      Nasal turbinates nl    NECK :  without  apparent JVD/ palpable Nodes/TM    LUNGS: no acc muscle use,  Nl contour chest which is clear to A and P bilaterally without cough on insp or exp  maneuvers   CV:  RRR  no s3 or murmur or increase in P2, and no edema   ABD:  soft and nontender with nl inspiratory excursion in the supine position. No bruits or organomegaly appreciated   MS:  Nl gait/ ext warm without deformities Or obvious joint  restrictions  calf tenderness, cyanosis or clubbing    SKIN: warm and dry without lesions    NEURO:  alert, approp, nl sensorium with  no motor or cerebellar deficits apparent.             CXR PA and Lateral:   06/28/2022 :    I personally reviewed images and impression is as follows:     Overall much improvement , no recurrent pl effusoin of any signficance     Assessment

## 2022-06-28 NOTE — Telephone Encounter (Signed)
I spoke with the patient and she reports that she would like a prescription to send in for the medication for allergies as its cheaper at her pharmacy per the patient. I have sent in the refill. Nothing further needed.

## 2022-06-29 ENCOUNTER — Encounter: Payer: Self-pay | Admitting: Internal Medicine

## 2022-06-29 NOTE — Assessment & Plan Note (Signed)
Onset was April 2021 / same time she quit smoking  - try off acei 04/14/2020 and added  pepcid 20 mg after supper - flare Oct 17 2020 assoc rhinitis? Sinusitis > rec augmentin/pred and cyclical cough rx    - 03/18/2021 much better off acei > f/u pfts ordered but not done    - 04/21/2022  After extensive coaching inhaler device,  effectiveness =    75% > symbicort 80 2bid and minimize saba/ d/c propranolol (HA) if tolerates  - Allergy screen 06/28/2022 >  Eos 0. /  IgE    Lungs are clear and improving ambulation with main issue pnds > try 1st gen H1 blockers per guidelines    Needs pfts prior to next ov if possible

## 2022-06-29 NOTE — Assessment & Plan Note (Signed)
HC03   03/04/22  = 32   As of 06/28/2022 using 3lpm hs and not needing daytime   F/u int 3 m with pfts if possible prior to ov          Each maintenance medication was reviewed in detail including emphasizing most importantly the difference between maintenance and prns and under what circumstances the prns are to be triggered using an action plan format where appropriate.  Total time for H and P, chart review, counseling, reviewing hfa/02 device(s) and generating customized AVS unique to this office visit / same day charting =  25 min

## 2022-06-29 NOTE — Assessment & Plan Note (Signed)
See admit 02/24/22 assoc with MRSA bacteremia req R chest tube/ lytics   No evidence of recurrence.

## 2022-06-30 ENCOUNTER — Telehealth: Payer: Self-pay | Admitting: Internal Medicine

## 2022-06-30 LAB — CBC WITH DIFFERENTIAL/PLATELET
Basophils Absolute: 0.1 10*3/uL (ref 0.0–0.2)
Basos: 1 %
EOS (ABSOLUTE): 0.2 10*3/uL (ref 0.0–0.4)
Eos: 2 %
Hematocrit: 41.8 % (ref 34.0–46.6)
Hemoglobin: 13.7 g/dL (ref 11.1–15.9)
Immature Grans (Abs): 0 10*3/uL (ref 0.0–0.1)
Immature Granulocytes: 1 %
Lymphocytes Absolute: 3.6 10*3/uL — ABNORMAL HIGH (ref 0.7–3.1)
Lymphs: 43 %
MCH: 31.5 pg (ref 26.6–33.0)
MCHC: 32.8 g/dL (ref 31.5–35.7)
MCV: 96 fL (ref 79–97)
Monocytes Absolute: 0.5 10*3/uL (ref 0.1–0.9)
Monocytes: 6 %
Neutrophils Absolute: 4 10*3/uL (ref 1.4–7.0)
Neutrophils: 47 %
Platelets: 250 10*3/uL (ref 150–450)
RBC: 4.35 x10E6/uL (ref 3.77–5.28)
RDW: 13.7 % (ref 11.7–15.4)
WBC: 8.3 10*3/uL (ref 3.4–10.8)

## 2022-06-30 LAB — IGE: IgE (Immunoglobulin E), Serum: 352 IU/mL (ref 6–495)

## 2022-06-30 NOTE — Telephone Encounter (Signed)
Patient called to ask the nurse or doctor to call and explain her test results.  She stated she looked at it on mychart but does not understand.  Please advise.

## 2022-07-01 NOTE — Telephone Encounter (Signed)
Called patient and she states that her cough is still bad and is asking for cough medication be called in for her  Please advise   No fever noted. Dry cough noted. No covid. No flu exposure. Please advise sir

## 2022-07-01 NOTE — Telephone Encounter (Signed)
She has tried Mucinex, and Delsym with no relief. She wants to know if she can have some cough syrup. This is the only thing that works for her. She wants to have some prescription cough syrup until she can get in the office for a follow up.

## 2022-07-01 NOTE — Telephone Encounter (Signed)
Mucinex dm 1200 is the strongest non narcotic cough med made  She has percocet according to [facility] that can be used to supplement the mucinex dm as can the phenergan also on the list (the latter of which we can refill s appt if she's run out) and make sure she's taking the chlorpheniramine otc as well

## 2022-07-01 NOTE — Telephone Encounter (Signed)
My notes don't mention much of a cough at ov but do list her having tessalon so if needs refill rx is 200 mg tid prn   Can combine this with delsym 2 tsp bid if dry cough  OR  mucinex dm 1200 mg every 12 hour if congested wet cough and see me for the cough next week in [place] if not improving but must bring all active meds including otcs if she wants me to hep her with it further

## 2022-07-03 MED ORDER — BENZONATATE 200 MG PO CAPS: 200.0000 mg | ORAL_CAPSULE | Freq: Three times a day (TID) | ORAL | 1 refills | Status: DC | PRN

## 2022-07-03 NOTE — Telephone Encounter (Signed)
Calling for persistent cough and requesting cough syrup Based on previous notes, she is already on Percocet at home. I did add cough syrup. I did send in a prescription for Tessalon.  She can discuss with Dr. [name] during the week if cough is not improved

## 2022-07-04 ENCOUNTER — Telehealth: Payer: Self-pay

## 2022-07-04 DIAGNOSIS — J869 Pyothorax without fistula: Secondary | ICD-10-CM

## 2022-07-04 DIAGNOSIS — R600 Localized edema: Secondary | ICD-10-CM | POA: Diagnosis not present

## 2022-07-04 DIAGNOSIS — J069 Acute upper respiratory infection, unspecified: Secondary | ICD-10-CM | POA: Diagnosis not present

## 2022-07-04 DIAGNOSIS — G40909 Epilepsy, unspecified, not intractable, without status epilepticus: Secondary | ICD-10-CM | POA: Diagnosis not present

## 2022-07-04 NOTE — Telephone Encounter (Signed)
Pfts ordered. Nothing further needed

## 2022-07-04 NOTE — Telephone Encounter (Signed)
Medication sent in by Dr [name]. Addressed with patient. Nothing further needed

## 2022-07-04 NOTE — Telephone Encounter (Signed)
-----   Message from Tanda Rockers, MD sent at 06/29/2022  5:54 AM EDT ----- Pleased order pfts

## 2022-07-05 ENCOUNTER — Other Ambulatory Visit: Payer: Medicaid Other | Admitting: Family Medicine

## 2022-07-05 ENCOUNTER — Encounter: Payer: Self-pay | Admitting: Family Medicine

## 2022-07-05 ENCOUNTER — Telehealth: Payer: Self-pay | Admitting: Internal Medicine

## 2022-07-05 VITALS — BP 130/84 | HR 91 | Resp 18

## 2022-07-05 DIAGNOSIS — J9611 Chronic respiratory failure with hypoxia: Secondary | ICD-10-CM

## 2022-07-05 DIAGNOSIS — J449 Chronic obstructive pulmonary disease, unspecified: Secondary | ICD-10-CM

## 2022-07-05 NOTE — Telephone Encounter (Signed)
Left message for patient to call back  

## 2022-07-05 NOTE — Progress Notes (Signed)
[facility] Palliative Care Consult Note Telephone: [phone]  Fax: [phone]    Date of encounter: 07/05/22 12:00 PM PATIENT NAME: Beverly Tran [address] [place] [address]   [phone] (home)  DOB: Jul 18, 1971 MRN: [id number] PRIMARY CARE PROVIDER:    Olga Coaster, [place],  [place] [address] [place] [address] [phone]  REFERRING PROVIDER:   Olga Coaster, [place] [address] [place],   [id number] [phone]  RESPONSIBLE PARTY:    Contact Information     Name Relation Home Work [place] George [phone]  [phone]        I met face to face with patient in her home. Palliative Care was asked to follow this patient by consultation request of  [place], [place] Dragon, FNP to address advance care planning and complex medical decision making. This is a follow up visit   ASSESSMENT , SYMPTOM MANAGEMENT AND PLAN / RECOMMENDATIONS:   Chronic Respiratory Failure with Hypoxia and hypercapnia Educated about CO2 in COPD controlling drive to breathe and if Oxygen sat > 94%, CO2 levels rise, decreasing drive to breathe.   COPD    Encouraged to maintain O2 sats 88-92%.                                                            Encouraged to discuss flu shot and consider booster with Covid new variaint which has just come out and to get influenza vaccine.                                                                                                                         Identification of a healthcare agent-Partner [name] CODE STATUS: Full code, full scope of treatment      Follow up Palliative Care Visit: Palliative care will continue to follow for complex medical decision making, advance care planning, and clarification of goals. Return 4 weeks or prn.    This visit was coded based on medical decision making (MDM).  PPS: 50%  HOSPICE ELIGIBILITY/DIAGNOSIS: TBD  Chief Complaint:  Palliative  Care is following for chronic medical management of chronic respiratory failure due to COPD.  Palliative Care continues to follow to assist with refining/defining goals of care.  HISTORY OF PRESENT ILLNESS:  Beverly Tran is a 51 y.o. year old female with HTN, Chronic hypoxic respiratory failure due to COPD, dysphagia, GERD,  DM, obesity, hypokalemia and seizures.  Back in the spring pt developed multifocal pneumonia with MRSA bacteremia and development of an empyema.  Pt reports she had been out of her Depakote to treat her seizures for about 4 months and resumed in mid August 2023.  She states her last seizure was Sunday.  Initially Neurologist  restarted her on Depakote 250 mg TID but she was too sleepy so this was decreased to 250 mg BID.  She states insulin was changed to Trulicity 7.93 mg. She states her fasting blood sugar this am was 120 and it has been better since starting Trulicity but she cannot remember numbers for comparison. She was seen at the office yesterday for several days of increased, painful dependent edema in her legs.  She was instructed to wear compression hose.  She denies nausea, vomiting, falls, constipation. She does have an appt for MRI of right knee and head on 07/21/22.  She reports frequent headaches of about 6/month but had 1 that lasted 5 days recently with reported hx of migraines. She was taken off Seroquel and Lyrica due to falls and was on Seroquel 200 mg QHS which helped with sleep.  Currently reports difficulty falling asleep and this has been longstanding issue.  She has used Trazodone previously and wants to try using 200 mg nightly to help with sleep.  History obtained from review of EMR, discussion with Ms. Marcos.   I reviewed EMR for available labs, medications, imaging, studies and related documents.  There are no new records since last visit/Records reviewed and summarized above.   ROS General: NAD EYES: denies vision changes ENMT: denies  dysphagia Cardiovascular: denies chest pain, endorses DOE Pulmonary: denies cough, denies increased SOB Abdomen: endorses good appetite, denies constipation, endorses continence of bowel GU: denies dysuria, endorses continence of urine MSK:  denies increased weakness, no falls reported Neurological: endorses pain and insomnia Psych: Endorses improved coping with loss of her mother-in-law unexpectedly Heme/lymph/immuno: denies bruises, abnormal bleeding  Physical Exam: Current and past weights: 216 lbs 6.4 ounces as of 06/28/22, 215 lbs as of 10/16/21 Constitutional: NAD General: obese, chronically ill ENMT: intact hearing, oral mucous membranes moist, dentition intact CV: S1S2, irregular with frequent premature beats, 1+ BLE edema wearing compression hose Pulmonary: Decreased breath sounds, no increased work of breathing, no cough Abdomen:  normo-active BS + 4 quadrants, soft and non tender, no ascites MSK: no sarcopenia, moves all extremities, ambulatory Skin: warm and dry, no rashes or wounds on visible skin Neuro:  no generalized weakness,  no cognitive impairment Psych: non-anxious affect, A and O x 3 Hem/lymph/immuno: no widespread bruising   Thank you for the opportunity to participate in the care of Ms. Batt.  The palliative care team will continue to follow. Please call our office at [phone] if we can be of additional assistance.   Marijo Conception, FNP -C  COVID-19 PATIENT SCREENING TOOL Asked and negative response unless otherwise noted:   Have you had symptoms of covid, tested positive or been in contact with someone with symptoms/positive test in the past 5-10 days?  No

## 2022-07-05 NOTE — Telephone Encounter (Signed)
Patient would like the nurse to call regarding switching from [facility] to [facility].  She stated that she is supposed to get a portable concentrator and [facility] has a back order for a few months.  Please call patient to discuss at [phone]

## 2022-07-07 ENCOUNTER — Telehealth: Payer: Self-pay | Admitting: Internal Medicine

## 2022-07-07 DIAGNOSIS — R69 Illness, unspecified: Secondary | ICD-10-CM | POA: Diagnosis not present

## 2022-07-07 DIAGNOSIS — F329 Major depressive disorder, single episode, unspecified: Secondary | ICD-10-CM | POA: Diagnosis not present

## 2022-07-07 NOTE — Telephone Encounter (Signed)
Can't do narcotic cough med s ov with all active meds in hand Ok to add to schedule for 9/22

## 2022-07-07 NOTE — Telephone Encounter (Signed)
ATC patient back. Lvm

## 2022-07-07 NOTE — Telephone Encounter (Signed)
Atc patient.  

## 2022-07-07 NOTE — Telephone Encounter (Signed)
Patient called back and Beverly Tran said her cough has not improved since Beverly Tran started taking allergy medicine recommended at last ov. Beverly Tran wants to know if Dr. [name] will send in hydromet for her since Beverly Tran knows her insurance will cover this and Beverly Tran knows it works and there will not be a copay   Patient also wants to know if we can switch her dme from [facility] to [facility] and order a poc as well.   Please advise.

## 2022-07-07 NOTE — Telephone Encounter (Signed)
Pt returning call.  Available after 2.

## 2022-07-07 NOTE — Telephone Encounter (Signed)
Patient states that she has been taking allergy medication that was given to her last week, but nothing is helping with her allergies and cough.  Please call back at [phone].

## 2022-07-08 ENCOUNTER — Encounter: Payer: Self-pay | Admitting: Family Medicine

## 2022-07-08 NOTE — Telephone Encounter (Signed)
Pt wants Meagan to call about cough medicine and changing walk test  to something else.  Please advise.  [phone]

## 2022-07-08 NOTE — Telephone Encounter (Signed)
Called and spoke with pt letting her know the info from MW and she verbalized understanding. Pt's appt has been moved up sooner for her with MW and made a note that pt will need to have a walk test done due to wanting to switch DMEs from [facility] to [facility]. Nothing further needed.

## 2022-07-09 ENCOUNTER — Telehealth: Payer: Self-pay | Admitting: Student

## 2022-07-09 MED ORDER — PREDNISONE 10 MG PO TABS: 10.0000 mg | ORAL_TABLET | Freq: Every day | ORAL | 0 refills | Status: AC

## 2022-07-09 MED ORDER — AZITHROMYCIN 250 MG PO TABS: ORAL_TABLET | ORAL | 0 refills | Status: DC

## 2022-07-09 NOTE — Telephone Encounter (Signed)
Cough worse over the last week. Pain on right side from coughing so much. More productive over the last couple days. No worsening dyspnea. No fever. Tested for covid at home negative. She asks for opioid containing cough medication. She is on perocet at home. Our office has declined opioid containing medication without first at least seeing her in clinic. I declined to prescribe it.   Prescribed 5 days of prednisone 10 mg daily (requests low dose due to hyperglycemia in past) and azithromycin

## 2022-07-11 DIAGNOSIS — J302 Other seasonal allergic rhinitis: Secondary | ICD-10-CM | POA: Diagnosis not present

## 2022-07-11 DIAGNOSIS — Z6837 Body mass index (BMI) 37.0-37.9, adult: Secondary | ICD-10-CM | POA: Diagnosis not present

## 2022-07-11 DIAGNOSIS — R29898 Other symptoms and signs involving the musculoskeletal system: Secondary | ICD-10-CM | POA: Diagnosis not present

## 2022-07-11 DIAGNOSIS — Z79891 Long term (current) use of opiate analgesic: Secondary | ICD-10-CM | POA: Diagnosis not present

## 2022-07-11 DIAGNOSIS — R600 Localized edema: Secondary | ICD-10-CM | POA: Diagnosis not present

## 2022-07-11 DIAGNOSIS — I10 Essential (primary) hypertension: Secondary | ICD-10-CM | POA: Diagnosis not present

## 2022-07-11 DIAGNOSIS — E119 Type 2 diabetes mellitus without complications: Secondary | ICD-10-CM | POA: Diagnosis not present

## 2022-07-11 DIAGNOSIS — G40909 Epilepsy, unspecified, not intractable, without status epilepticus: Secondary | ICD-10-CM | POA: Diagnosis not present

## 2022-07-11 DIAGNOSIS — K219 Gastro-esophageal reflux disease without esophagitis: Secondary | ICD-10-CM | POA: Diagnosis not present

## 2022-07-11 DIAGNOSIS — E785 Hyperlipidemia, unspecified: Secondary | ICD-10-CM | POA: Diagnosis not present

## 2022-07-11 DIAGNOSIS — J449 Chronic obstructive pulmonary disease, unspecified: Secondary | ICD-10-CM | POA: Diagnosis not present

## 2022-07-11 DIAGNOSIS — G2581 Restless legs syndrome: Secondary | ICD-10-CM | POA: Diagnosis not present

## 2022-07-12 ENCOUNTER — Telehealth: Payer: Self-pay | Admitting: Internal Medicine

## 2022-07-12 NOTE — Telephone Encounter (Signed)
Zostrix otc is probably cheaper and works better but ok to refill lidocain until next ov and discuss it again then

## 2022-07-12 NOTE — Telephone Encounter (Signed)
Pt wanting lidocaine cream called in for her. States she had tube placed during April visit to hospital, and she has pain near that site now. Pharmacy is [facility] in [place].  Dr. [name] please advise

## 2022-07-13 ENCOUNTER — Other Ambulatory Visit: Payer: Self-pay | Admitting: Gastroenterology

## 2022-07-13 MED ORDER — LIDOCAINE 5 % EX OINT: TOPICAL_OINTMENT | Freq: Every day | CUTANEOUS | 0 refills | Status: DC | PRN

## 2022-07-13 NOTE — Telephone Encounter (Signed)
Lidocaine cream refilled. ATC pt to notify and received busy signal. Nothing further needed at this time.

## 2022-07-13 NOTE — Telephone Encounter (Signed)
Sending in limited refill. She needs follow-up OV with Roseanne Kaufman, NP.

## 2022-07-13 NOTE — Telephone Encounter (Signed)
OV made w/AB

## 2022-07-18 NOTE — Telephone Encounter (Signed)
Pt asking if she is needs to still do a walk for upcoming appt.  Please advise.

## 2022-07-18 NOTE — Telephone Encounter (Signed)
Called and spoke to patient and she wanted to make sure she was still doing walk when she came in on Monday. I verified for her we are still planning to walk her in office. Patient is planning to talk to Dr. [name] about cough medicine Hydromet at next office visit. Nothing further needed.

## 2022-07-20 ENCOUNTER — Telehealth: Payer: Self-pay

## 2022-07-20 NOTE — Telephone Encounter (Signed)
(  3:30 pm) PC SW rescheduled a visit with RN/SW team for 08/04/22 @ 10:00 am.

## 2022-07-21 ENCOUNTER — Ambulatory Visit (HOSPITAL_COMMUNITY): Payer: 59

## 2022-07-21 DIAGNOSIS — G2581 Restless legs syndrome: Secondary | ICD-10-CM | POA: Diagnosis not present

## 2022-07-21 DIAGNOSIS — H6693 Otitis media, unspecified, bilateral: Secondary | ICD-10-CM | POA: Diagnosis not present

## 2022-07-22 ENCOUNTER — Other Ambulatory Visit: Payer: Self-pay | Admitting: Gastroenterology

## 2022-07-25 ENCOUNTER — Telehealth: Payer: Self-pay | Admitting: Internal Medicine

## 2022-07-25 ENCOUNTER — Ambulatory Visit: Payer: 59 | Admitting: Internal Medicine

## 2022-07-25 ENCOUNTER — Other Ambulatory Visit (HOSPITAL_COMMUNITY): Payer: Self-pay | Admitting: Psychiatry

## 2022-07-25 DIAGNOSIS — R059 Cough, unspecified: Secondary | ICD-10-CM

## 2022-07-25 NOTE — Progress Notes (Deleted)
Beverly Tran, female    DOB: 1971/03/14     MRN: [id number]   Brief patient profile:  51 yowf quit smoking 01/2020  "born with bronchitis"  (mother smoked)  On "breathing pill they quit making" then around age 51 changed to  inhalers as needed  and ever since and did fine s maint rx   until April 2021 with onset of cough refractory to all rx including  x for hydocodone so referred to pulmonary clinic 04/14/2020 by Dr   Sherryll Burger      History of Present Illness  04/14/2020  Pulmonary/ 1st office eval/Deverick Pruss  S/p 2nd of moderna 04/02/20  Chief Complaint  Patient presents with   Pulmonary Consult    Referred by Dr Sherryll Burger. Pt c/o cough x 2 months. Cough is non prod and worse at night. She has found the only thing that has helped is hydrocodone cough syrup.   Dyspnea:  Only problem is heat - does fine in a/c including housework  Cough:worse at hs / dry sometimes choking  Sleep: bed is flat 2 pillows  SABA use: not helping cough  maint now on singulair / not using symbicort// already on gabapentin 300 tid and lyrica rec Valsartan 160-12.5 one daily instead of lotensin Add pepcid 20 mg one after supper  until return  GERD diet/ bedblocks For cough > hydrocodone up to a tsp every 4 hours as needed but should be better w/in 2 weeks Please schedule a follow up office visit in 6 weeks, call sooner if needed with all medications /inhalers/ solutions in hand so we can verify exactly what you are taking. This includes all medications from all doctors and over the counters    11/25/2020  f/u ov/[place] office/Trichelle Lehan re:  Def doing much  better p last ov until 1st of 2022 eval by Sherryll Burger NP with nasal congestion / clear mucus / did not bring meds as requested Chief Complaint  Patient presents with   Follow-up    Nasal congestion, non productive cough  Dyspnea: was doing fine until onset flare 1s of the year  Cough: worse when puts head down/ noct cough and in am  Sleeping: bed is flat/ 2 pillows  SABA  use: hfa not helping/ neb helps  more  02: 2lpm hs and  Prn daytime Covid status:2nd pfizer 09/08/20  Lung cancer screening: does not meet age criteria rec Discuss stopping the inderal with your neurologist  Pantoprazole (protonix) 40 mg   Take  30-60 min before first meal of the day and Pepcid (famotidine)  20 mg one after supper until return to office - this is the best way to tell whether stomach acid is contributing to your problem.   GERD Augmentin 875 mg take one pill twice daily  X 10 days - take at breakfast and supper with large glass of water.  It would help reduce the usual side effects (diarrhea and yeast infections) if you ate cultured yogurt at lunch.  Prednisone 10 mg take  4 each am x 2 days,   2 each am x 2 days,  1 each am x 2 days and stop  For cough > hydrocodone up to a tsp every 4 hours as needed but should be better w/in 2 weeks Please remember to go to the lab department  > did not go  Please schedule a follow up office visit in 6 weeks, call sooner if needed with pfts on return > did not do  Admit date:     02/24/2022  Discharge date: 03/04/22  Discharge Physician: York Ram Arrien        Recommendations at discharge:     Continue antibiotic therapy with linezolid to complete 4 weeks from 02/26/22, until 03/26/22. Hold on diuretic therapy Continue supplemental 02 per [place] at home.  Follow up as outpatient in 7 to 10 days with primary care.  Follow up with pulmonary to repeat chest imaging.    Discharge Diagnoses: Active Problems:   Empyema (HCC)   COPD (chronic obstructive pulmonary disease) (HCC)   Type 2 diabetes mellitus with hyperlipidemia (HCC)   Essential hypertension   Gastroesophageal reflux disease without esophagitis   Anxiety   Hypokalemia   Iron deficiency anemia   Class 2 obesity     Hospital Course: Beverly Tran was admitted to the hospital with the working diagnosis of sepsis due to pneumonia, complicated with empyema.     51 yo female with the past medical history of COPD, T2DM, hypertension and obesity class 2 who presented with dyspnea. Initially admitted to community acquired pneumonia on 02/16/22, she had high oxygen requirements, and required non invasive mechanical ventilation. Her blood cultures were positive for MRSA. On 02/23/22 she left the hospital against medical advice. She return the following day with worsening dyspnea. At home 02 saturation was 77% on 6 L/min of supplemental 02 per [place]. She was in respiratory distress and placed back on Bipap, her blood pressure was 119/100, HR 101, RR 22, temp 101.3 and 02 saturation 86%, lungs with no wheezing, positive rales at the rifht side with decreased breath sounds, heart with S1 and S2 present and rhythmic, abdomen not distended and positive lower extremity edema.    Chest radiograph with right pleural effusion and bilateral lower lobes interstitial infiltrates.  CT chest with no pulmonary embolism, worsening multifocal pneumonia with ground glass opacities of the left lower lobe and signs of necrotic pneumonia on the right lower lobe. Moderate loculated right pleural effusion.  Small left pleural effusion.    Patient was placed on broad spectrum antibiotic and pulmonary was consulted.  [facility] was [facility], for further management.  Chest tube was placed to the right pleural effusion and received fibrinolytic therapy with good toleration. 05/15 chest tube was removed.    Further work up with TEE was negative for vegetations, and antibiotic therapy was changed from IV vancomycin to oral linezolid to continue until 03/26/22, to complete 4 weeks from 02/26/22.    Oxygen requirements have improved, and patient will follow up as outpatient.          Assessment and Plan: Empyema (HCC) Sepsis complicated with MRSA bacteremia/ severe sepsis end organ damage hypoxemic respiratory failure. (present on admission).    Patient had a prolonged  hospitalization, she was placed on broad spectrum antibiotic therapy.  Transferred [facility] to [facility] for further management.    05/11 right thoracentesis too organized to drain.    05//13/23 chest tune was placed, 14 french pigtail catheter and connected to suction -20 cm H20.  05/14 started with pleural lytics.    Cultures were no growth, but she had positive MRSA bacteremia in [facility], 2 weeks prior while being treated for pneumonia (she left AMA on that occasion).    Antibiotic therapy was narrowed to IV vancomycin Patient with high risk for creating a bronchopleural fistula and not candidate for decortications.    TEE negative for vegetations. Plan to transition to linezolid to  complete therapy on 03/26/22.    Acute hypoxemic respiratory failure, that has been improving.  At home patient will continue using supplemental 02 per [place] and instructed to follow up as outpatient.       COPD (chronic obstructive pulmonary disease) (HCC) No clinical signs of exacerbation Continue bronchodilator therapy and oxymetry monitoring.    Type 2 diabetes mellitus with hyperlipidemia (HCC) Her glucose remained stable, she was placed on insulin sliding scale during her hospitalization with good toleration.  At the time of her discharge she will resume metformin, and pioglitazone.    Essential hypertension Blood pressure has been stable. At the time of her discharge she will resume irbesartan, propanolol and amlodipine.   Echocardiogram with preserved LV systolic function, will hold on furosemide for now, instructions for close follow up as outpatient.    Gastroesophageal reflux disease without esophagitis Continue with antiacid therapy.    Anxiety Continue with clonidine 0.5 bid    Hypokalemia Electrolytes were corrected, at the time of her discharge her renal function is stable with serum cr at 0.41, K is 3,9 and serum bicarbonate at 32.    Plan to hold on diuretic therapy and follow  up renal function as outpatient.    Iron deficiency anemia Continue with iron supplementation    Class 2 obesity Calculated BMI is 36.4    04/21/2022  post hosp f/u ov/[place] office/Abbie Berling re: AB vs UACS  maint on symbicort 80 but not using  and on propranolol 10 mg daily  Chief Complaint  Patient presents with   Hospitalization Follow-up    Feels breathing is doing better since being in the hospital.   Is using 5LO2 pulse   Dyspnea:  "right much walking" inside house only for now/ came to office in w/c Cough: minmal mucoid  Sleeping: bed is flat Left side/ 2 pillow SABA use: neb qid  02: 5lpm hs  24/7  Rec Try off the propranolol to see what diffence if any it makes  Plan A = Automatic = Always=    symbicort 80 Take 2 puffs first thing in am and then another 2 puffs about 12 hours later.   Work on inhaler technique:  - practice with the empty inhaler I gave you today  Plan B = Backup (to supplement plan A, not to replace it) Only use your albuterol inhaler as a rescue medication Plan C = Crisis (instead of Plan B but only if Plan B stops working) - only use your albuterol nebulizer if you first try Plan B and it fails to help > ok to use the nebulizer up to every 4 hours but if start needing it regularly call for immediate appointment Make sure you check your oxygen saturation  AT  your highest level of activity (not after you stop)   to be sure it stays over 90%       06/28/2022  f/u ov/[place] office/Derold Dorsch re: AB maint on symbicort   Chief Complaint  Patient presents with   Follow-up    She is doing well. She had her CXR.   Dyspnea:  walked across street for cxr s 02  Cough: minimal / some hs  Sleeping: flat bed/ L side  SABA use: none 02: 3lpm hs  Covid status: vax x 4  Rec Bed blockers (risers) 6-8 inches under head board Prednisone 10 mg take  4 each am x 2 days,   2 each am x 2 days,  1 each am x 2 days and  stop  Work on inhaler technique:   For drainage /  throat tickle at bed time stop xyzal  try take CHLORPHENIRAMINE  4 mg   Please remember to go to the lab department for your tests - we will call you with the results when they are available.         07/25/2022  f/u ov/[place] office/Keijuan Schellhase re: *** maint on ***  No chief complaint on file.   Dyspnea:  *** Cough: *** Sleeping: *** SABA use: *** 02: *** Covid status: *** Lung cancer screening: ***   No obvious day to day or daytime variability or assoc excess/ purulent sputum or mucus plugs or hemoptysis or cp or chest tightness, subjective wheeze or overt sinus or hb symptoms.   *** without nocturnal  or early am exacerbation  of respiratory  c/o's or need for noct saba. Also denies any obvious fluctuation of symptoms with weather or environmental changes or other aggravating or alleviating factors except as outlined above   No unusual exposure hx or h/o childhood pna/ asthma or knowledge of premature birth.  Current Allergies, Complete Past Medical History, Past Surgical History, Family History, and Social History were reviewed in [employer] record.  ROS  The following are not active complaints unless bolded Hoarseness, sore throat, dysphagia, dental problems, itching, sneezing,  nasal congestion or discharge of excess mucus or purulent secretions, ear ache,   fever, chills, sweats, unintended wt loss or wt gain, classically pleuritic or exertional cp,  orthopnea pnd or arm/hand swelling  or leg swelling, presyncope, palpitations, abdominal pain, anorexia, nausea, vomiting, diarrhea  or change in bowel habits or change in bladder habits, change in stools or change in urine, dysuria, hematuria,  rash, arthralgias, visual complaints, headache, numbness, weakness or ataxia or problems with walking or coordination,  change in mood or  memory.        No outpatient medications have been marked as taking for the 07/25/22 encounter (Appointment) with Tanda Rockers, MD.                Past Medical History:  Diagnosis Date   Anxiety    Asthma    Back pain, chronic    Depression    High cholesterol    Hypertension    Migraine          Objective:    wts   07/25/2022        ***  06/28/2022       216  04/21/2022         unable to stand   03/18/2021         219   11/25/20 213 lb 6.4 oz (96.8 kg)  04/14/20 210 lb (95.3 kg)  10/12/16 190 lb (86.2 kg)    Vital signs reviewed  07/25/2022  - Note at rest 02 sats  ***% on ***   General appearance:    ***               Assessment

## 2022-07-25 NOTE — Telephone Encounter (Signed)
Called and spoke to patient. She wanted to ensure we could still walk her in office during rescheduled appt. Confirmed for her that we can still do the walk to see if she needs Portable O2. She voiced understanding. Patient also states that she had her PCP order a CXR and will let us know if there is an abnormality on CXR for Dr. [name] to look at. Nothing further needed at this time.

## 2022-07-26 ENCOUNTER — Telehealth: Payer: Self-pay | Admitting: Neurology

## 2022-07-26 ENCOUNTER — Telehealth: Payer: Self-pay | Admitting: Internal Medicine

## 2022-07-26 ENCOUNTER — Encounter: Payer: Self-pay | Admitting: Neurology

## 2022-07-26 DIAGNOSIS — R079 Chest pain, unspecified: Secondary | ICD-10-CM | POA: Diagnosis not present

## 2022-07-26 DIAGNOSIS — R059 Cough, unspecified: Secondary | ICD-10-CM | POA: Diagnosis not present

## 2022-07-27 MED ORDER — FAMOTIDINE 20 MG PO TABS: 20.0000 mg | ORAL_TABLET | Freq: Every day | ORAL | 4 refills | Status: DC

## 2022-07-27 NOTE — Telephone Encounter (Signed)
Called and spoke to patient and she stated she needed refills on her pepcid. Verified pharmacy with patient. Nothing further needed

## 2022-08-01 DIAGNOSIS — R69 Illness, unspecified: Secondary | ICD-10-CM | POA: Diagnosis not present

## 2022-08-02 DIAGNOSIS — R29898 Other symptoms and signs involving the musculoskeletal system: Secondary | ICD-10-CM | POA: Diagnosis not present

## 2022-08-02 DIAGNOSIS — J029 Acute pharyngitis, unspecified: Secondary | ICD-10-CM | POA: Diagnosis not present

## 2022-08-02 DIAGNOSIS — K047 Periapical abscess without sinus: Secondary | ICD-10-CM | POA: Diagnosis not present

## 2022-08-04 ENCOUNTER — Other Ambulatory Visit: Payer: Medicaid Other

## 2022-08-04 ENCOUNTER — Other Ambulatory Visit: Payer: Medicaid Other | Admitting: *Deleted

## 2022-08-04 DIAGNOSIS — Z515 Encounter for palliative care: Secondary | ICD-10-CM

## 2022-08-04 NOTE — Progress Notes (Signed)
COMMUNITY PALLIATIVE CARE SW NOTE  PATIENT NAME: Beverly Tran DOB: 1971/03/06 MRN: [id number]  PRIMARY CARE PROVIDER: Olga Coaster, FNP  RESPONSIBLE PARTY:  Acct ID - Guarantor Home Phone Work Phone Relationship Acct Type  [id number] PRACHI, [phone]  Self P/F     [address], [place], [place] [address]   SOCIAL WORK VISIT  PC SW and RN-M. Nadara Mustard completed a follow-up visit with patient at her apartment. Her partner was present with her. Patient provided a status update on herself. Patient report that she is currently on an ABT for an ear infection. She report that she continues to have difficulty getting up from her bed or chair due to constant back, leg and hip pain. She report that her legs "feel wobbly". Patient advised that she has several upcoming appointments with her cardiologist, pulmonologist, new family medicine, and pain management. Patient report that she continues to have short-term memory issues. She also has anxiety. She and her partner are experiencing loss as it related to the death of her son and the partner's mother. SW provided education regarding the availability of bereavement support through [facility]. SW encouraged them to call that agency if they feel bereavement services are desired.  Patient report that she does have insurance [employer] (histocompatibility and immunogenetics)) to pay for her medications. She receives food stamps, but inquired if there were additional resources to cover [employer]. The suggested that she talk with her PCP and have an order sent to [facility] and see if her insurance will cover any of the cost for the supplemental drinks. Patient utilizes [facility] to go to medical appointments. She reported that she is working with an attorney ([place]) to apply for social security disability. Patient's partner run errands for her when needed. She states he also helps her to remember her appointments. The team discussed new palliative program  requirements and she verbalized understanding of information provided. No other concerns noted.  Duration of visit and documentation: 60 minutes  Katheren Puller, LCSW

## 2022-08-06 NOTE — Therapy (Incomplete)
OUTPATIENT PHYSICAL THERAPY LOWER EXTREMITY EVALUATION   Patient Name: Beverly Tran MRN: [id number] DOB:1971/05/18, 51 y.o., female Today's Date: 08/06/2022    Past Medical History:  Diagnosis Date   Anxiety    Arthritis    Asthma    Back pain, chronic    Depression    Diabetes mellitus without complication (HCC)    GERD (gastroesophageal reflux disease)    High cholesterol    Hypertension    IBS (irritable bowel syndrome)    Migraine    Past Surgical History:  Procedure Laterality Date   APPENDECTOMY     CESAREAN SECTION     CHOLECYSTECTOMY     COLONOSCOPY  2003   poor prep, grossly normal rectum, colon, and TI. Path not available.    ESOPHAGOGASTRODUODENOSCOPY  2003   Dr. Gala Romney:  normal esophagus, stomach, duodenum s/p small bowel biopsy.    HERNIA REPAIR     TEE WITHOUT CARDIOVERSION N/A 03/03/2022   Procedure: TRANSESOPHAGEAL ECHOCARDIOGRAM (TEE);  Surgeon: Lelon Perla, MD;  Location: [facility] ENDOSCOPY;  Service: Cardiovascular;  Laterality: N/A;   TUBAL LIGATION     Patient Active Problem List   Diagnosis Date Noted   Unresolved grief 06/01/2022   Chronic respiratory failure with hypoxia and hypercapnia ([place]) 04/22/2022   Rib pain on right side 03/29/2022   Empyema lung ([place]) 03/17/2022   Medication monitoring encounter 03/17/2022   MRSA bacteremia 03/17/2022   Class 2 obesity 03/02/2022   Iron deficiency anemia 03/02/2022   Hypokalemia 03/02/2022   Empyema (HCC)    Chronic obstructive pulmonary disease ([place]) 02/24/2022   Anxiety 02/24/2022   Multifocal pneumonia 02/16/2022   Leukocytosis 02/16/2022   Type 2 diabetes mellitus with hyperlipidemia ([place]) 02/16/2022   Hypoalbuminemia due to protein-calorie malnutrition ([place]) 02/16/2022   Lactic acidosis 02/16/2022   Elevated brain natriuretic peptide (BNP) level 02/16/2022   COPD with acute exacerbation ([place]) 02/16/2022   Acute on chronic respiratory failure with hypoxia (HCC) 02/16/2022    Gastroesophageal reflux disease without esophagitis 04/06/2021   Chronic diarrhea 12/09/2020   Dysphagia 12/09/2020   Upper airway cough syndrome vs AB 04/14/2020   Essential hypertension 04/14/2020    PCP: ***  REFERRING PROVIDER: ***  REFERRING DIAG: ***  THERAPY DIAG:  No diagnosis found.  Rationale for Evaluation and Treatment {HABREHAB:27488}  ONSET DATE: ***  SUBJECTIVE:   SUBJECTIVE STATEMENT: ***  PERTINENT HISTORY: *** PAIN:  Are you having pain? {OPRCPAIN:27236}  PRECAUTIONS: {Therapy precautions:24002}  WEIGHT BEARING RESTRICTIONS: {Yes ***/No:24003}  FALLS:  Has patient fallen in last 6 months? {fallsyesno:27318}  LIVING ENVIRONMENT: Lives with: {OPRC lives with:25569::"lives with their family"} Lives in: {Lives in:25570} Stairs: {opstairs:27293} Has following equipment at home: {Assistive devices:23999}  OCCUPATION: ***  PLOF: {PLOF:24004}  PATIENT GOALS: ***   OBJECTIVE:   DIAGNOSTIC FINDINGS: ***  PATIENT SURVEYS:  {rehab surveys:24030}  COGNITION: Overall cognitive status: {cognition:24006}     SENSATION: {sensation:27233}  EDEMA:  {edema:24020}  MUSCLE LENGTH: Hamstrings: Right *** deg; Left *** deg Thomas test: Right *** deg; Left *** deg  POSTURE: {posture:25561}  PALPATION: ***  LOWER EXTREMITY ROM:  {AROM/PROM:27142} ROM Right eval Left eval  Hip flexion    Hip extension    Hip abduction    Hip adduction    Hip internal rotation    Hip external rotation    Knee flexion    Knee extension    Ankle dorsiflexion    Ankle plantarflexion    Ankle inversion    Ankle eversion     (  Blank rows = not tested)  LOWER EXTREMITY MMT:  MMT Right eval Left eval  Hip flexion    Hip extension    Hip abduction    Hip adduction    Hip internal rotation    Hip external rotation    Knee flexion    Knee extension    Ankle dorsiflexion    Ankle plantarflexion    Ankle inversion    Ankle eversion     (Blank rows  = not tested)  LOWER EXTREMITY SPECIAL TESTS:  {LEspecialtests:26242}  FUNCTIONAL TESTS:  {Functional tests:24029}  GAIT: Distance walked: *** Assistive device utilized: {Assistive devices:23999} Level of assistance: {Levels of assistance:24026} Comments: ***   TODAY'S TREATMENT                                                                          DATE: ***  PATIENT EDUCATION:  Education details: *** Person educated: {Person educated:25204} Education method: {Education Method:25205} Education comprehension: {Education Comprehension:25206}  HOME EXERCISE PROGRAM: ***  ASSESSMENT:  CLINICAL IMPRESSION: Patient is a *** y.o. *** who was seen today for physical therapy evaluation and treatment for ***.   OBJECTIVE IMPAIRMENTS: {opptimpairments:25111}.   ACTIVITY LIMITATIONS: {activitylimitations:27494}  PARTICIPATION LIMITATIONS: {participationrestrictions:25113}  PERSONAL FACTORS: {Personal factors:25162} are also affecting patient's functional outcome.   REHAB POTENTIAL: {rehabpotential:25112}  CLINICAL DECISION MAKING: {clinical decision making:25114}  EVALUATION COMPLEXITY: {Evaluation complexity:25115}   GOALS: Goals reviewed with patient? {yes/no:20286}  SHORT TERM GOALS: Target date: {follow up:25551}  *** Baseline: Goal status: {GOALSTATUS:25110}  2.  *** Baseline:  Goal status: {GOALSTATUS:25110}  3.  *** Baseline:  Goal status: {GOALSTATUS:25110}  4.  *** Baseline:  Goal status: {GOALSTATUS:25110}  5.  *** Baseline:  Goal status: {GOALSTATUS:25110}  6.  *** Baseline:  Goal status: {GOALSTATUS:25110}  LONG TERM GOALS: Target date: {follow up:25551}   *** Baseline:  Goal status: {GOALSTATUS:25110}  2.  *** Baseline:  Goal status: {GOALSTATUS:25110}  3.  *** Baseline:  Goal status: {GOALSTATUS:25110}  4.  *** Baseline:  Goal status: {GOALSTATUS:25110}  5.  *** Baseline:  Goal status: {GOALSTATUS:25110}  6.   *** Baseline:  Goal status: {GOALSTATUS:25110}   PLAN:  PT FREQUENCY: {rehab frequency:25116}  PT DURATION: {rehab duration:25117}  PLANNED INTERVENTIONS: {rehab planned interventions:25118::"Therapeutic exercises","Therapeutic activity","Neuromuscular re-education","Balance training","Gait training","Patient/Family education","Self Care","Joint mobilization"}  PLAN FOR NEXT SESSION: ***   Ardis Rowan, PT 08/06/2022, 11:20 AM

## 2022-08-08 ENCOUNTER — Ambulatory Visit (HOSPITAL_COMMUNITY): Payer: 59

## 2022-08-09 DIAGNOSIS — J441 Chronic obstructive pulmonary disease with (acute) exacerbation: Secondary | ICD-10-CM | POA: Diagnosis not present

## 2022-08-10 ENCOUNTER — Other Ambulatory Visit: Payer: Self-pay | Admitting: Pulmonary Disease

## 2022-08-11 ENCOUNTER — Ambulatory Visit: Payer: 59 | Admitting: Gastroenterology

## 2022-08-11 NOTE — Telephone Encounter (Signed)
Dr. Wert, please advise if you are okay with us refilling med. 

## 2022-08-14 DIAGNOSIS — M79602 Pain in left arm: Secondary | ICD-10-CM | POA: Diagnosis not present

## 2022-08-14 DIAGNOSIS — J45909 Unspecified asthma, uncomplicated: Secondary | ICD-10-CM | POA: Diagnosis not present

## 2022-08-14 DIAGNOSIS — G3184 Mild cognitive impairment, so stated: Secondary | ICD-10-CM | POA: Diagnosis not present

## 2022-08-14 DIAGNOSIS — I25119 Atherosclerotic heart disease of native coronary artery with unspecified angina pectoris: Secondary | ICD-10-CM | POA: Diagnosis not present

## 2022-08-14 DIAGNOSIS — M62838 Other muscle spasm: Secondary | ICD-10-CM | POA: Diagnosis not present

## 2022-08-14 DIAGNOSIS — E7800 Pure hypercholesterolemia, unspecified: Secondary | ICD-10-CM | POA: Diagnosis not present

## 2022-08-14 DIAGNOSIS — R059 Cough, unspecified: Secondary | ICD-10-CM | POA: Diagnosis not present

## 2022-08-14 DIAGNOSIS — G40909 Epilepsy, unspecified, not intractable, without status epilepticus: Secondary | ICD-10-CM | POA: Diagnosis not present

## 2022-08-14 DIAGNOSIS — R69 Illness, unspecified: Secondary | ICD-10-CM | POA: Diagnosis not present

## 2022-08-14 DIAGNOSIS — K219 Gastro-esophageal reflux disease without esophagitis: Secondary | ICD-10-CM | POA: Diagnosis not present

## 2022-08-14 DIAGNOSIS — I10 Essential (primary) hypertension: Secondary | ICD-10-CM | POA: Diagnosis not present

## 2022-08-14 DIAGNOSIS — M7989 Other specified soft tissue disorders: Secondary | ICD-10-CM | POA: Diagnosis not present

## 2022-08-14 DIAGNOSIS — E785 Hyperlipidemia, unspecified: Secondary | ICD-10-CM | POA: Diagnosis not present

## 2022-08-14 DIAGNOSIS — E1140 Type 2 diabetes mellitus with diabetic neuropathy, unspecified: Secondary | ICD-10-CM | POA: Diagnosis not present

## 2022-08-14 DIAGNOSIS — K5900 Constipation, unspecified: Secondary | ICD-10-CM | POA: Diagnosis not present

## 2022-08-14 DIAGNOSIS — Z7984 Long term (current) use of oral hypoglycemic drugs: Secondary | ICD-10-CM | POA: Diagnosis not present

## 2022-08-14 DIAGNOSIS — M79601 Pain in right arm: Secondary | ICD-10-CM | POA: Diagnosis not present

## 2022-08-16 ENCOUNTER — Encounter (HOSPITAL_COMMUNITY): Payer: 59

## 2022-08-16 ENCOUNTER — Other Ambulatory Visit: Payer: Medicaid Other | Admitting: Family Medicine

## 2022-08-17 ENCOUNTER — Ambulatory Visit: Payer: Self-pay | Admitting: Allergy & Immunology

## 2022-08-18 ENCOUNTER — Encounter (HOSPITAL_COMMUNITY): Payer: Self-pay

## 2022-08-18 ENCOUNTER — Ambulatory Visit (HOSPITAL_COMMUNITY): Payer: 59

## 2022-08-18 ENCOUNTER — Ambulatory Visit (HOSPITAL_COMMUNITY): Payer: 59 | Attending: Internal Medicine

## 2022-08-22 ENCOUNTER — Ambulatory Visit: Payer: 59 | Admitting: Internal Medicine

## 2022-08-23 DIAGNOSIS — R69 Illness, unspecified: Secondary | ICD-10-CM | POA: Diagnosis not present

## 2022-08-24 ENCOUNTER — Other Ambulatory Visit: Payer: Self-pay | Admitting: Gastroenterology

## 2022-08-25 ENCOUNTER — Encounter: Payer: Self-pay | Admitting: Neurology

## 2022-08-25 ENCOUNTER — Ambulatory Visit: Payer: 59 | Admitting: Neurology

## 2022-08-25 DIAGNOSIS — R413 Other amnesia: Secondary | ICD-10-CM | POA: Diagnosis not present

## 2022-08-25 DIAGNOSIS — Z029 Encounter for administrative examinations, unspecified: Secondary | ICD-10-CM

## 2022-08-25 DIAGNOSIS — R531 Weakness: Secondary | ICD-10-CM | POA: Diagnosis not present

## 2022-08-29 DIAGNOSIS — R29898 Other symptoms and signs involving the musculoskeletal system: Secondary | ICD-10-CM | POA: Diagnosis not present

## 2022-08-29 DIAGNOSIS — R69 Illness, unspecified: Secondary | ICD-10-CM | POA: Diagnosis not present

## 2022-08-29 DIAGNOSIS — D329 Benign neoplasm of meninges, unspecified: Secondary | ICD-10-CM | POA: Diagnosis not present

## 2022-08-30 ENCOUNTER — Ambulatory Visit: Payer: Self-pay | Admitting: Family Medicine

## 2022-09-02 DIAGNOSIS — Z6841 Body Mass Index (BMI) 40.0 and over, adult: Secondary | ICD-10-CM | POA: Diagnosis not present

## 2022-09-02 DIAGNOSIS — D320 Benign neoplasm of cerebral meninges: Secondary | ICD-10-CM | POA: Diagnosis not present

## 2022-09-05 ENCOUNTER — Ambulatory Visit: Payer: 59 | Admitting: Internal Medicine

## 2022-09-05 DIAGNOSIS — R69 Illness, unspecified: Secondary | ICD-10-CM | POA: Diagnosis not present

## 2022-09-06 ENCOUNTER — Ambulatory Visit: Payer: 59 | Admitting: Gastroenterology

## 2022-09-06 ENCOUNTER — Telehealth: Payer: Self-pay | Admitting: Internal Medicine

## 2022-09-06 DIAGNOSIS — R051 Acute cough: Secondary | ICD-10-CM | POA: Diagnosis not present

## 2022-09-06 NOTE — Telephone Encounter (Signed)
Called and added patient to schedule 11/22 for mychart visit

## 2022-09-06 NOTE — Telephone Encounter (Signed)
Ok to add on to 11/22 am schedule

## 2022-09-06 NOTE — Progress Notes (Deleted)
history of GERD, IBS-D and dysphagia.   Last Colonoscopy: (2003) poor colonic prep, grossly normal rectum, colon and TI. (No path available) Last Endoscopy: (2003) normal esophagus, small bowel biopsy (path not available)

## 2022-09-06 NOTE — Telephone Encounter (Signed)
Pt has dry cough.  Throat scratchy.  States Robitussin and Tessalon perles are not working.  Wanted to see if he could get something different until she can come in the office.  Medicaid starts 12/1.

## 2022-09-06 NOTE — Telephone Encounter (Signed)
Would need ov with all meds in hand

## 2022-09-06 NOTE — Telephone Encounter (Signed)
Called and notified patient of response. She voiced understanding.   Patient wants to know if she can do a mychart video visit since she cannot afford transportation and can't afford a copay to come in to office anytime soon but states she needs something to help with her cough and wants to know if she does a mychart video visit if she can have medication sent to her pharmacy. Please advise.

## 2022-09-07 ENCOUNTER — Telehealth (INDEPENDENT_AMBULATORY_CARE_PROVIDER_SITE_OTHER): Payer: 59 | Admitting: Internal Medicine

## 2022-09-07 ENCOUNTER — Encounter: Payer: Self-pay | Admitting: Internal Medicine

## 2022-09-07 ENCOUNTER — Encounter: Payer: 59 | Admitting: Internal Medicine

## 2022-09-07 DIAGNOSIS — R058 Other specified cough: Secondary | ICD-10-CM | POA: Diagnosis not present

## 2022-09-07 MED ORDER — PREDNISONE 10 MG PO TABS: ORAL_TABLET | ORAL | 0 refills | Status: DC

## 2022-09-07 NOTE — Progress Notes (Signed)
Beverly Tran, female    DOB: Dec 10, 1970     MRN: [id number]   Brief patient profile:  51 yowf quit smoking 01/2020  "born with bronchitis"  (mother smoked)  On "breathing pill they quit making" then around age 51 changed to  inhalers as needed  and ever since and did fine s maint rx   until April 2021 with onset of cough refractory to all rx including  x for hydocodone so referred to pulmonary clinic 04/14/2020 by Dr   Sherryll Burger      History of Present Illness  04/14/2020  Pulmonary/ 1st Tran eval/Beverly Tran  S/p 2nd of moderna 04/02/20  Chief Complaint  Patient presents with   Pulmonary Consult    Referred by Dr Sherryll Burger. Pt c/o cough x 2 months. Cough is non prod and worse at night. She has found the only thing that has helped is hydrocodone cough syrup.   Dyspnea:  Only problem is heat - does fine in a/c including housework  Cough:worse at hs / dry sometimes choking  Sleep: bed is flat 2 pillows  SABA use: not helping cough  maint now on singulair / not using symbicort// already on gabapentin 300 tid and lyrica rec Valsartan 160-12.5 one daily instead of lotensin Add pepcid 20 mg one after supper  until return  GERD diet/ bedblocks For cough > hydrocodone up to a tsp every 4 hours as needed but should be better w/in 2 weeks Please schedule a follow up Tran visit in 6 weeks, call sooner if needed with all medications /inhalers/ solutions in hand so we can verify exactly what you are taking. This includes all medications from all doctors and over the counters    11/25/2020  f/u ov/Beverly Tran/Beverly Tran re:  Def doing much  better p last ov until 1st of 2022 eval by Sherryll Burger NP with nasal congestion / clear mucus / did not bring meds as requested Chief Complaint  Patient presents with   Follow-up    Nasal congestion, non productive cough  Dyspnea: was doing fine until onset flare 1s of the year  Cough: worse when puts head down/ noct cough and in am  Sleeping: bed is flat/ 2 pillows  SABA  use: hfa not helping/ neb helps  more  02: 2lpm hs and  Prn daytime Covid status:2nd pfizer 09/08/20  Lung cancer screening: does not meet age criteria rec Discuss stopping the inderal with your neurologist  Pantoprazole (protonix) 40 mg   Take  30-60 min before first meal of the day and Pepcid (famotidine)  20 mg one after supper until return to Tran - this is the best way to tell whether stomach acid is contributing to your problem.   GERD Augmentin 875 mg take one pill twice daily  X 10 days - take at breakfast and supper with large glass of water.  It would help reduce the usual side effects (diarrhea and yeast infections) if you ate cultured yogurt at lunch.  Prednisone 10 mg take  4 each am x 2 days,   2 each am x 2 days,  1 each am x 2 days and stop  For cough > hydrocodone up to a tsp every 4 hours as needed but should be better w/in 2 weeks Please remember to go to the lab department  > did not go  Please schedule a follow up Tran visit in 6 weeks, call sooner if needed with pfts on return > did not do  Admit date:     02/24/2022  Discharge date: 03/04/22  Discharge Physician: York Ram Arrien        Recommendations at discharge:     Continue antibiotic therapy with linezolid to complete 4 weeks from 02/26/22, until 03/26/22. Hold on diuretic therapy Continue supplemental 02 per [place] at home.  Follow up as outpatient in 7 to 10 days with primary care.  Follow up with pulmonary to repeat chest imaging.    Discharge Diagnoses: Active Problems:   Empyema (HCC)   COPD (chronic obstructive pulmonary disease) (HCC)   Type 2 diabetes mellitus with hyperlipidemia (HCC)   Essential hypertension   Gastroesophageal reflux disease without esophagitis   Anxiety   Hypokalemia   Iron deficiency anemia   Class 2 obesity     Hospital Course: Beverly Tran was admitted to the hospital with the working diagnosis of sepsis due to pneumonia, complicated with empyema.     51 yo female with the past medical history of COPD, T2DM, hypertension and obesity class 2 who presented with dyspnea. Initially admitted to community acquired pneumonia on 02/16/22, she had high oxygen requirements, and required non invasive mechanical ventilation. Her blood cultures were positive for MRSA. On 02/23/22 she left the hospital against medical advice. She return the following day with worsening dyspnea. At home 02 saturation was 77% on 6 L/min of supplemental 02 per [place]. She was in respiratory distress and placed back on Bipap, her blood pressure was 119/100, HR 101, RR 22, temp 101.3 and 02 saturation 86%, lungs with no wheezing, positive rales at the rifht side with decreased breath sounds, heart with S1 and S2 present and rhythmic, abdomen not distended and positive lower extremity edema.    Chest radiograph with right pleural effusion and bilateral lower lobes interstitial infiltrates.  CT chest with no pulmonary embolism, worsening multifocal pneumonia with ground glass opacities of the left lower lobe and signs of necrotic pneumonia on the right lower lobe. Moderate loculated right pleural effusion.  Small left pleural effusion.    Patient was placed on broad spectrum antibiotic and pulmonary was consulted.  She was transferred from [facility] to [facility], for further management.  Chest tube was placed to the right pleural effusion and received fibrinolytic therapy with good toleration. 05/15 chest tube was removed.    Further work up with TEE was negative for vegetations, and antibiotic therapy was changed from IV vancomycin to oral linezolid to continue until 03/26/22, to complete 4 weeks from 02/26/22.    Oxygen requirements have improved, and patient will follow up as outpatient.          Assessment and Plan: Empyema (HCC) Sepsis complicated with MRSA bacteremia/ severe sepsis end organ damage hypoxemic respiratory failure. (present on admission).    Patient had a prolonged  hospitalization, she was placed on broad spectrum antibiotic therapy.  Transferred from [facility] to [facility] for further management.    05/11 right thoracentesis too organized to drain.    05//13/23 chest tune was placed, 14 french pigtail catheter and connected to suction -20 cm H20.  05/14 started with pleural lytics.    Cultures were no growth, but she had positive MRSA bacteremia in [facility], 2 weeks prior while being treated for pneumonia (she left AMA on that occasion).    Antibiotic therapy was narrowed to IV vancomycin Patient with high risk for creating a bronchopleural fistula and not candidate for decortications.    TEE negative for vegetations. Plan to transition to linezolid to  complete therapy on 03/26/22.    Acute hypoxemic respiratory failure, that has been improving.  At home patient will continue using supplemental 02 per [place] and instructed to follow up as outpatient.       COPD (chronic obstructive pulmonary disease) (HCC) No clinical signs of exacerbation Continue bronchodilator therapy and oxymetry monitoring.    Type 2 diabetes mellitus with hyperlipidemia (HCC) Her glucose remained stable, she was placed on insulin sliding scale during her hospitalization with good toleration.  At the time of her discharge she will resume metformin, and pioglitazone.    Essential hypertension Blood pressure has been stable. At the time of her discharge she will resume irbesartan, propanolol and amlodipine.   Echocardiogram with preserved LV systolic function, will hold on furosemide for now, instructions for close follow up as outpatient.    Gastroesophageal reflux disease without esophagitis Continue with antiacid therapy.    Anxiety Continue with clonidine 0.5 bid    Hypokalemia Electrolytes were corrected, at the time of her discharge her renal function is stable with serum cr at 0.41, K is 3,9 and serum bicarbonate at 32.    Plan to hold on diuretic therapy and follow  up renal function as outpatient.    Iron deficiency anemia Continue with iron supplementation    Class 2 obesity Calculated BMI is 36.4    04/21/2022  post hosp f/u ov/Beverly Tran/Beverly Tran re: AB vs UACS  maint on symbicort 80 but not using  and on propranolol 10 mg daily  Chief Complaint  Patient presents with   Hospitalization Follow-up    Feels breathing is doing better since being in the hospital.   Is using 5LO2 pulse   Dyspnea:  "right much walking" inside house only for now/ came to Tran in w/c Cough: minmal mucoid  Sleeping: bed is flat Left side/ 2 pillow SABA use: neb qid  02: 5lpm hs  24/7  Rec Try off the propranolol to see what diffence if any it makes  Plan A = Automatic = Always=    symbicort 80 Take 2 puffs first thing in am and then another 2 puffs about 12 hours later.   Work on inhaler technique:  - practice with the empty inhaler I gave you today  Plan B = Backup (to supplement plan A, not to replace it) Only use your albuterol inhaler as a rescue medication Plan C = Crisis (instead of Plan B but only if Plan B stops working) - only use your albuterol nebulizer if you first try Plan B and it fails to help > ok to use the nebulizer up to every 4 hours but if start needing it regularly call for immediate appointment Make sure you check your oxygen saturation  AT  your highest level of activity (not after you stop)   to be sure it stays over 90%       06/28/2022  f/u ov/[place] Tran/Beverly Tran re: AB maint on symbicort   Chief Complaint  Patient presents with   Follow-up    She is doing well. She had her CXR.   Dyspnea:  walked across street for cxr s 02  Cough: minimal / some hs  Sleeping: flat bed/ L side  SABA use: none 02: 3lpm hs  Covid status: vax x 4  Rec Bed blockers (risers) 6-8 inches under head board Prednisone 10 mg take  4 each am x 2 days,   2 each am x 2 days,  1 each am x 2 days and  stop  Work on inhaler technique:     For drainage /  throat tickle at bed time stop xyzal  try take CHLORPHENIRAMINE  4 mg   Virtual Visit via Telephone Note 09/07/2022   I connected with Beverly Tran on 09/07/22 at 11:15 AM EST by telephone and verified that I am speaking with the correct person using two identifiers. Pt is at home and this call made from my Tran with no other participants    I discussed the limitations, risks, security and privacy concerns of performing an evaluation and management service by telephone and the availability of in person appointments. I also discussed with the patient that there may be a patient responsible charge related to this service. The patient expressed understanding and agreed to proceed.   History of Present Illness: Dyspnea:  ok with activity  Cough: worse x one week > mostly clear when lie down / tessalon Sleeping: bed is flat, 2 pillow SABA use: not needing  02: not using at all    No obvious day to day or daytime variability or assoc excess/ purulent sputum or mucus plugs or hemoptysis or cp or chest tightness, subjective wheeze or overt sinus or hb symptoms.    Also denies any obvious fluctuation of symptoms with weather or environmental changes or other aggravating or alleviating factors except as outlined above.   Meds reviewed/ med reconciliation completed     Current Meds  Medication Sig   albuterol (VENTOLIN HFA) 108 (90 Base) MCG/ACT inhaler Inhale 2 puffs into the lungs every 6 (six) hours as needed for wheezing or shortness of breath.   benzonatate (TESSALON) 200 MG capsule TAKE 1 CAPSULE (200 MG TOTAL) BY MOUTH 3 (THREE) TIMES DAILY AS NEEDED FOR COUGH.   chlorpheniramine (CHLOR-TRIMETON) 4 MG tablet Take 1 tablet (4 mg total) by mouth 2 (two) times daily as needed for allergies.   Cholecalciferol (VITAMIN D3) 10 MCG (400 UNIT) CAPS Take 400 Units by mouth daily.   clonazePAM (KLONOPIN) 0.5 MG tablet Take 0.5 mg by mouth 2 (two) times daily as needed.   cyclobenzaprine  (FLEXERIL) 10 MG tablet Take 1 tablet by mouth every 8 (eight) hours as needed.   divalproex (DEPAKOTE ER) 250 MG 24 hr tablet Take 250 mg by mouth daily. Take 1 in am and 2 in pm.   Dulaglutide (TRULICITY) 0.75 MG/0.5ML SOPN Inject 0.75 mg into the skin.   DULoxetine (CYMBALTA) 60 MG capsule Take 60 mg by mouth daily. Takes 30 in am and 60 mg at night   EPINEPHrine 0.3 mg/0.3 mL IJ SOAJ injection Inject 0.3 mg into the muscle as directed.   famotidine (PEPCID) 20 MG tablet Take 1 tablet (20 mg total) by mouth daily.   FEROSUL 325 (65 Fe) MG tablet Take 325 mg by mouth 2 (two) times daily.   furosemide (LASIX) 40 MG tablet Take 1 tablet (40 mg total) by mouth daily as needed for fluid or edema (as needed for leg swelling or worsening shortnes of breath.).   gabapentin (NEURONTIN) 600 MG tablet Take 600 mg by mouth 3 (three) times daily.   hydrOXYzine (ATARAX/VISTARIL) 25 MG tablet Take 25 mg by mouth daily as needed for anxiety.   ipratropium-albuterol (DUONEB) 0.5-2.5 (3) MG/3ML SOLN Take 3 mLs by nebulization every 4 (four) hours as needed (shortness of breath or wheezing).   levocetirizine (XYZAL) 5 MG tablet Take 10 mg by mouth at bedtime.   lidocaine (XYLOCAINE) 5 % ointment Apply topically daily as  needed.   meclizine (ANTIVERT) 25 MG tablet Take 25 mg by mouth 2 (two) times daily as needed for nausea.   metFORMIN (GLUCOPHAGE) 500 MG tablet Take 500 mg by mouth 2 (two) times daily with a meal.   montelukast (SINGULAIR) 10 MG tablet Take 10 mg by mouth daily with supper.   naloxone (NARCAN) nasal spray 4 mg/0.1 mL 1 spray once.   olmesartan-hydrochlorothiazide (BENICAR HCT) 20-12.5 MG tablet TAKE ONE TABLET BY MOUTH ONCE DAILY (MORNING)   oxyCODONE-acetaminophen (PERCOCET) 10-325 MG tablet Out x 3 weeks    OZEMPIC, 1 MG/DOSE, 4 MG/3ML SOPN Inject 2 mg into the skin every 7 (seven) days.   pantoprazole (PROTONIX) 40 MG tablet TAKE ONE TABLET BY MOUTH DAILY (MORNING) (Patient taking  differently: Take 40 mg by mouth daily.)   pioglitazone (ACTOS) 45 MG tablet Take 45 mg by mouth daily.   pramipexole (MIRAPEX) 1 MG tablet Take 1.5 mg by mouth at bedtime.   promethazine (PHENERGAN) 25 MG tablet TAKE ONE TABLET BY MOUTH EVERY 8 HOURS AS NEEDED FOR NAUSEA AND VOMITING   rosuvastatin (CRESTOR) 10 MG tablet Take 10 mg by mouth daily.   SYMBICORT 80-4.5 MCG/ACT inhaler Take 2 puffs first thing in am and then another 2 puffs about 12 hours later.         Observations/Objective: Looks fine on video / slt hoarse voice / no conversational sob    Assessment and Plan: See problem list for active a/p's   Follow Up Instructions: See avs for instructions unique to this ov which includes revised/ updated med list     I discussed the assessment and treatment plan with the patient. The patient was provided an opportunity to ask questions and all were answered. The patient agreed with the plan and demonstrated an understanding of the instructions.   The patient was advised to call back or seek an in-person evaluation if the symptoms worsen or if the condition fails to improve as anticipated.  I provided > 21  minutes of virtual face to face  time during this encounter.   Sandrea Hughs, MD          Assessment

## 2022-09-07 NOTE — Assessment & Plan Note (Signed)
Onset was April 2021 / same time she quit smoking  - try off acei 04/14/2020 and added  pepcid 20 mg after supper - flare Oct 17 2020 assoc rhinitis? Sinusitis > rec augmentin/pred and cyclical cough rx   - 03/18/2021 much better off acei > f/u pfts ordered but not done    - 04/21/2022  After extensive coaching inhaler device,  effectiveness =    75% > symbicort 80 2bid and minimize saba/ d/c propranolol (HA) if tolerates  - Allergy screen 06/28/2022 >  Eos 0.2 /  IgE  352   Flared around 08/31/22 rec  For cough/congestion  > mucinex dm 1200 mg every 12 hours as needed and supplement with tessalon 200 three times daily and ok to take phenergan up to 4 x daily if still coughing  When coughing use the nebulizer instead of the inhaler for your albuterol as needed   Continue Pantoprazole (protonix) 40 mg  but Take  30-60 min before first meal of the day and continue  Pepcid (famotidine)  20 mg after supper until return to office - this is the best way to tell whether stomach acid is contributing to your problem.    When coughing take an extra gabapenitn 600 before bed then resume the provious when cough is gone  If nothing else works >  Prednisone 10 mg take  4 each am x 2 days,   2 each am x 2 days,  1 each am x 2 days and stop    Please schedule a follow up office visit in 4 weeks, sooner if needed  with all medications /inhalers/ solutions in hand so we can verify exactly what you are taking. This includes all medications from all doctors and over the counters

## 2022-09-07 NOTE — Patient Instructions (Addendum)
For cough/congestion  > mucinex dm 1200 mg every 12 hours as needed and supplement with tessalon 200 three times daily and ok to take phenergan up to 4 x daily   When coughing use the nebulizer instead of the inhaler for your albuterol   Continue Pantoprazole (protonix) 40 mg  but Take  30-60 min before first meal of the day and continue  Pepcid (famotidine)  20 mg after supper until return to office - this is the best way to tell whether stomach acid is contributing to your problem.    When coughing take an extra gabapenitn 600 before bed then resume the provious when cough is gone  If nothing else works >  Prednisone 10 mg take  4 each am x 2 days,   2 each am x 2 days,  1 each am x 2 days and stop    Please schedule a follow up office visit in 4 weeks, sooner if needed  with all medications /inhalers/ solutions in hand so we can verify exactly what you are taking. This includes all medications from all doctors and over the counters

## 2022-09-10 ENCOUNTER — Telehealth: Payer: Self-pay | Admitting: Pulmonary Disease

## 2022-09-10 MED ORDER — BENZONATATE 200 MG PO CAPS: 200.0000 mg | ORAL_CAPSULE | Freq: Three times a day (TID) | ORAL | 1 refills | Status: DC | PRN

## 2022-09-10 MED ORDER — GABAPENTIN 600 MG PO TABS: 600.0000 mg | ORAL_TABLET | Freq: Four times a day (QID) | ORAL | 0 refills | Status: DC

## 2022-09-10 NOTE — Telephone Encounter (Signed)
[facility] Telephone Encounter  Patient called in regarding cough. Followed by Dr. Sherene Sires on 09/07/22. Advised to take gabapentin 600 mg TID and nightly. Originally PCP prescribed this. Does not have enough. Requesting enough until she can contact her office next week. Also requested tessalon perles. Both prescriptions sent to requested pharmacy

## 2022-09-12 ENCOUNTER — Telehealth: Payer: Self-pay | Admitting: Internal Medicine

## 2022-09-12 NOTE — Telephone Encounter (Signed)
Called and spoke with patient.  She states her cold that she saw Dr. Sherene Sires last week on mychart video chat.  She states her cough has worsened since last week. States she has tried recommendations from Dr. Sherene Sires and cough is not improved, she states she has a dry cough that has worsened. Has finished prednisone but it doesn't help.  States she has not tried nebulizer yet as recommended because she cannot get neb supplies until Thursday.  She wants to know if there are any other recommendations/ medications she can try.

## 2022-09-12 NOTE — Telephone Encounter (Signed)
None - would need ov with all meds in hand before next attempt to help her or go to UC  in meantime

## 2022-09-12 NOTE — Telephone Encounter (Signed)
Called and gave patient recs from Dr. Sherene Sires. She voiced understanding and states she will go to UC if necessary.

## 2022-09-13 DIAGNOSIS — R059 Cough, unspecified: Secondary | ICD-10-CM | POA: Diagnosis not present

## 2022-09-13 DIAGNOSIS — J3489 Other specified disorders of nose and nasal sinuses: Secondary | ICD-10-CM | POA: Diagnosis not present

## 2022-09-15 ENCOUNTER — Other Ambulatory Visit: Payer: Self-pay | Admitting: Gastroenterology

## 2022-09-16 ENCOUNTER — Ambulatory Visit: Payer: Medicaid Other | Admitting: Allergy & Immunology

## 2022-09-23 ENCOUNTER — Ambulatory Visit: Payer: 59 | Admitting: Internal Medicine

## 2022-09-27 ENCOUNTER — Telehealth: Payer: Self-pay | Admitting: Internal Medicine

## 2022-09-27 MED ORDER — BENZONATATE 200 MG PO CAPS: 200.0000 mg | ORAL_CAPSULE | Freq: Three times a day (TID) | ORAL | 3 refills | Status: DC | PRN

## 2022-09-27 NOTE — Telephone Encounter (Signed)
Fax received from pt's pharmacy for a refill request of benzonatate 200mg .  Dr. , please advise if you are okay with med being refilled.

## 2022-09-27 NOTE — Telephone Encounter (Signed)
Ok to refill x 3 

## 2022-09-27 NOTE — Telephone Encounter (Signed)
Refill sent. Called and updated patient on refill. Nothing further needed

## 2022-10-07 ENCOUNTER — Ambulatory Visit: Payer: Medicaid Other | Admitting: Allergy & Immunology

## 2022-10-14 ENCOUNTER — Ambulatory Visit: Payer: Medicaid Other | Admitting: Neurology

## 2022-10-18 ENCOUNTER — Ambulatory Visit: Payer: 59 | Admitting: Neurology

## 2022-10-20 ENCOUNTER — Ambulatory Visit: Payer: 59 | Admitting: Internal Medicine

## 2022-10-20 NOTE — Progress Notes (Deleted)
Beverly Tran, female    DOB: Apr 01, 1971     MRN: [id number]   Brief patient profile:  51 yowf quit smoking 01/2020  "born with bronchitis"  (mother smoked)  On "breathing pill they quit making" then around age 52 changed to  inhalers as needed  and ever since and did fine s maint rx   until April 2021 with onset of cough refractory to all rx including  x for hydocodone so referred to pulmonary clinic 04/14/2020 by Dr   Sherryll Burger      History of Present Illness  04/14/2020  Pulmonary/ 1st office eval/Keil Pickering  S/p 2nd of moderna 04/02/20  Chief Complaint  Patient presents with   Pulmonary Consult    Referred by Dr Sherryll Burger. Pt c/o cough x 2 months. Cough is non prod and worse at night. She has found the only thing that has helped is hydrocodone cough syrup.   Dyspnea:  Only problem is heat - does fine in a/c including housework  Cough:worse at hs / dry sometimes choking  Sleep: bed is flat 2 pillows  SABA use: not helping cough  maint now on singulair / not using symbicort// already on gabapentin 300 tid and lyrica rec Valsartan 160-12.5 one daily instead of lotensin Add pepcid 20 mg one after supper  until return  GERD diet/ bedblocks For cough > hydrocodone up to a tsp every 4 hours as needed but should be better w/in 2 weeks Please schedule a follow up office visit in 6 weeks, call sooner if needed with all medications /inhalers/ solutions in hand so we can verify exactly what you are taking. This includes all medications from all doctors and over the counters    11/25/2020  f/u ov/ office/Beverly Tran re:  Def doing much  better p last ov until 1st of 2022 eval by Sherryll Burger NP with nasal congestion / clear mucus / did not bring meds as requested Chief Complaint  Patient presents with   Follow-up    Nasal congestion, non productive cough  Dyspnea: was doing fine until onset flare 1s of the year  Cough: worse when puts head down/ noct cough and in am  Sleeping: bed is flat/ 2 pillows  SABA  use: hfa not helping/ neb helps  more  02: 2lpm hs and  Prn daytime Covid status:2nd pfizer 09/08/20  Lung cancer screening: does not meet age criteria rec Discuss stopping the inderal with your neurologist  Pantoprazole (protonix) 40 mg   Take  30-60 min before first meal of the day and Pepcid (famotidine)  20 mg one after supper until return to office - this is the best way to tell whether stomach acid is contributing to your problem.   GERD Augmentin 875 mg take one pill twice daily  X 10 days - take at breakfast and supper with large glass of water.  It would help reduce the usual side effects (diarrhea and yeast infections) if you ate cultured yogurt at lunch.  Prednisone 10 mg take  4 each am x 2 days,   2 each am x 2 days,  1 each am x 2 days and stop  For cough > hydrocodone up to a tsp every 4 hours as needed but should be better w/in 2 weeks Please remember to go to the lab department  > did not go  Please schedule a follow up office visit in 6 weeks, call sooner if needed with pfts on return > did not do  Admit date:     02/24/2022  Discharge date: 03/04/22  Discharge Physician: Beverly Tran        Recommendations at discharge:     Continue antibiotic therapy with linezolid to complete 4 weeks from 02/26/22, until 03/26/22. Hold on diuretic therapy Continue supplemental 02 per [place] at home.  Follow up as outpatient in 7 to 10 days with primary care.  Follow up with pulmonary to repeat chest imaging.    Discharge Diagnoses: Active Problems:   Empyema (HCC)   COPD (chronic obstructive pulmonary disease) (HCC)   Type 2 diabetes mellitus with hyperlipidemia (HCC)   Essential hypertension   Gastroesophageal reflux disease without esophagitis   Anxiety   Hypokalemia   Iron deficiency anemia   Class 2 obesity     Hospital Course: Beverly Tran was admitted to the hospital with the working diagnosis of sepsis due to pneumonia, complicated with empyema.     52 yo female with the past medical history of COPD, T2DM, hypertension and obesity class 2 who presented with dyspnea. Initially admitted to community acquired pneumonia on 02/16/22, she had high oxygen requirements, and required non invasive mechanical ventilation. Her blood cultures were positive for MRSA. On 02/23/22 she left the hospital against medical advice. She return the following day with worsening dyspnea. At home 02 saturation was 77% on 6 L/min of supplemental 02 per [place]. She was in respiratory distress and placed back on Bipap, her blood pressure was 119/100, HR 101, RR 22, temp 101.3 and 02 saturation 86%, lungs with no wheezing, positive rales at the rifht side with decreased breath sounds, heart with S1 and S2 present and rhythmic, abdomen not distended and positive lower extremity edema.    Chest radiograph with right pleural effusion and bilateral lower lobes interstitial infiltrates.  CT chest with no pulmonary embolism, worsening multifocal pneumonia with ground glass opacities of the left lower lobe and signs of necrotic pneumonia on the right lower lobe. Moderate loculated right pleural effusion.  Small left pleural effusion.    Patient was placed on broad spectrum antibiotic and pulmonary was consulted.  She was transferred [facility] to [facility], for further management.  Chest tube was placed to the right pleural effusion and received fibrinolytic therapy with good toleration. 05/15 chest tube was removed.    Further work up with TEE was negative for vegetations, and antibiotic therapy was changed from IV vancomycin to oral linezolid to continue until 03/26/22, to complete 4 weeks from 02/26/22.    Oxygen requirements have improved, and patient will follow up as outpatient.          Assessment and Plan: Empyema (HCC) Sepsis complicated with MRSA bacteremia/ severe sepsis end organ damage hypoxemic respiratory failure. (present on admission).    Patient had a prolonged  hospitalization, she was placed on broad spectrum antibiotic therapy.  Transferred from [facility] to [facility] for further management.    05/11 right thoracentesis too organized to drain.    05//13/23 chest tune was placed, 14 french pigtail catheter and connected to suction -20 cm H20.  05/14 started with pleural lytics.    Cultures were no growth, but she had positive MRSA bacteremia in [facility], 2 weeks prior while being treated for pneumonia (she left AMA on that occasion).    Antibiotic therapy was narrowed to IV vancomycin Patient with high risk for creating a bronchopleural fistula and not candidate for decortications.    TEE negative for vegetations. Plan to transition to linezolid to  complete therapy on 03/26/22.    Acute hypoxemic respiratory failure, that has been improving.  At home patient will continue using supplemental 02 per [place] and instructed to follow up as outpatient.       COPD (chronic obstructive pulmonary disease) (HCC) No clinical signs of exacerbation Continue bronchodilator therapy and oxymetry monitoring.    Type 2 diabetes mellitus with hyperlipidemia (HCC) Her glucose remained stable, she was placed on insulin sliding scale during her hospitalization with good toleration.  At the time of her discharge she will resume metformin, and pioglitazone.    Essential hypertension Blood pressure has been stable. At the time of her discharge she will resume irbesartan, propanolol and amlodipine.   Echocardiogram with preserved LV systolic function, will hold on furosemide for now, instructions for close follow up as outpatient.    Gastroesophageal reflux disease without esophagitis Continue with antiacid therapy.    Anxiety Continue with clonidine 0.5 bid    Hypokalemia Electrolytes were corrected, at the time of her discharge her renal function is stable with serum cr at 0.41, K is 3,9 and serum bicarbonate at 32.    Plan to hold on diuretic therapy and follow  up renal function as outpatient.    Iron deficiency anemia Continue with iron supplementation    Class 2 obesity Calculated BMI is 36.4    04/21/2022  post hosp f/u ov/[place] office/Beverly Tran re: AB vs UACS  maint on symbicort 80 but not using  and on propranolol 10 mg daily  Chief Complaint  Patient presents with   Hospitalization Follow-up    Feels breathing is doing better since being in the hospital.   Is using 5LO2 pulse   Dyspnea:  "right much walking" inside house only for now/ came to office in w/c Cough: minmal mucoid  Sleeping: bed is flat Left side/ 2 pillow SABA use: neb qid  02: 5lpm hs  24/7  Rec Try off the propranolol to see what diffence if any it makes  Plan A = Automatic = Always=    symbicort 80 Take 2 puffs first thing in am and then another 2 puffs about 12 hours later.   Work on inhaler technique:  - practice with the empty inhaler I gave you today  Plan B = Backup (to supplement plan A, not to replace it) Only use your albuterol inhaler as a rescue medication Plan C = Crisis (instead of Plan B but only if Plan B stops working) - only use your albuterol nebulizer if you first try Plan B and it fails to help > ok to use the nebulizer up to every 4 hours but if start needing it regularly call for immediate appointment Make sure you check your oxygen saturation  AT  your highest level of activity (not after you stop)   to be sure it stays over 90%       06/28/2022  f/u ov/[place] office/Beverly Tran re: AB maint on symbicort   Chief Complaint  Patient presents with   Follow-up    She is doing well. She had her CXR.   Dyspnea:  walked across street for cxr s 02  Cough: minimal / some hs  Sleeping: flat bed/ L side  SABA use: none 02: 3lpm hs  Covid status: vax x 4  Rec Bed blockers (risers) 6-8 inches under head board Prednisone 10 mg take  4 each am x 2 days,   2 each am x 2 days,  1 each am x 2 days and  stop  Work on inhaler technique: For drainage /  throat tickle at bed time stop xyzal  try take CHLORPHENIRAMINE  4 mg    Labs Allergy screen 06/28/2022 >  Eos 0.2 /  IgE  352   Virtual ov 09/07/22 For cough/congestion  > mucinex dm 1200 mg every 12 hours as needed and supplement with tessalon 200 three times daily and ok to take phenergan up to 4 x daily  When coughing use the nebulizer instead of the inhaler for your albuterol  Continue Pantoprazole (protonix) 40 mg  but Take  30-60 min before first meal of the day and continue  Pepcid (famotidine)  20 mg after supper until return to office - this is the best way to tell whether stomach acid is contributing to your problem.   When coughing take an extra gabapenitn 600 before bed then resume the provious when cough is gone If nothing else works >  Prednisone 10 mg take  4 each am x 2 days,   2 each am x 2 days,  1 each am x 2 days and stop    Please schedule a follow up office visit in 4 weeks, sooner if needed  with all medications /inhalers/ solutions in hand so we can verify exactly what you are taking. This includes all medications from all doctors and over the counters         Past Medical History:  Diagnosis Date   Anxiety    Asthma    Back pain, chronic    Depression    High cholesterol    Hypertension    Migraine          Objective:    wts   10/20/2022         ***  06/28/2022       216  04/21/2022         unable to stand   03/18/2021         219   11/25/20 213 lb 6.4 oz (96.8 kg)  04/14/20 210 lb (95.3 kg)  10/12/16 190 lb (86.2 kg)     Vital signs reviewed  10/20/2022  - Note at rest 02 sats  ***% on ***   General appearance:    ***               Assessment

## 2022-10-24 ENCOUNTER — Telehealth: Payer: Self-pay | Admitting: *Deleted

## 2022-10-24 NOTE — Telephone Encounter (Signed)
ATC patient. Line rang for approx. 2 min with no answer or vm option

## 2022-10-27 ENCOUNTER — Other Ambulatory Visit: Payer: Self-pay | Admitting: Gastroenterology

## 2022-10-28 ENCOUNTER — Encounter: Payer: Self-pay | Admitting: Neurology

## 2022-10-28 ENCOUNTER — Ambulatory Visit: Payer: Medicaid Other | Admitting: Neurology

## 2022-10-28 DIAGNOSIS — G2581 Restless legs syndrome: Secondary | ICD-10-CM

## 2022-10-28 DIAGNOSIS — R252 Cramp and spasm: Secondary | ICD-10-CM

## 2022-10-28 DIAGNOSIS — R569 Unspecified convulsions: Secondary | ICD-10-CM | POA: Diagnosis not present

## 2022-10-28 DIAGNOSIS — E0842 Diabetes mellitus due to underlying condition with diabetic polyneuropathy: Secondary | ICD-10-CM

## 2022-10-28 DIAGNOSIS — R404 Transient alteration of awareness: Secondary | ICD-10-CM | POA: Diagnosis not present

## 2022-10-28 DIAGNOSIS — G4700 Insomnia, unspecified: Secondary | ICD-10-CM

## 2022-10-28 MED ORDER — DIVALPROEX SODIUM ER 250 MG PO TB24: ORAL_TABLET | ORAL | 3 refills | Status: DC

## 2022-10-28 MED ORDER — CLONAZEPAM 0.5 MG PO TABS: 0.5000 mg | ORAL_TABLET | Freq: Two times a day (BID) | ORAL | 5 refills | Status: DC

## 2022-10-28 MED ORDER — PRAMIPEXOLE DIHYDROCHLORIDE 1 MG PO TABS: ORAL_TABLET | ORAL | 3 refills | Status: DC

## 2022-10-28 MED ORDER — GABAPENTIN 600 MG PO TABS: 600.0000 mg | ORAL_TABLET | Freq: Three times a day (TID) | ORAL | 3 refills | Status: DC
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
## 2022-11-07 ENCOUNTER — Ambulatory Visit: Payer: Medicaid Other | Admitting: Neurology

## 2022-11-07 ENCOUNTER — Telehealth: Payer: Self-pay | Admitting: Neurology

## 2022-11-07 DIAGNOSIS — R252 Cramp and spasm: Secondary | ICD-10-CM

## 2022-11-07 DIAGNOSIS — R404 Transient alteration of awareness: Secondary | ICD-10-CM | POA: Diagnosis not present

## 2022-11-07 NOTE — Telephone Encounter (Signed)
Pt came in for EEG and wanted to let Dr. Delice Lesch know her hands are still jerking a lot. She is wondering if the medication might need to be adjusted?

## 2022-11-07 NOTE — Progress Notes (Signed)
EEG complete - results pending 

## 2022-11-08 ENCOUNTER — Telehealth: Payer: Self-pay | Admitting: Anesthesiology

## 2022-11-08 MED ORDER — DIVALPROEX SODIUM ER 250 MG PO TB24: ORAL_TABLET | ORAL | 1 refills | Status: DC

## 2022-11-08 NOTE — Telephone Encounter (Signed)
Pt called an informed that the EEG did not show any seizure activity. Dr Delice Lesch is  not quite sure the jerking in her hands is all related to seizure, sometimes Restless Leg syndrome can also affect other limbs. We can increase the Depakote 250mg : take 2 tabs in AM, 3 tabs in PM but Dr Delice Lesch would like to do a prolonged home 48-hour EEG to capture the jerking to see what the brain waves show when she is having the jerks. Pt would like to do the 48 eeg, order placed in epic new script sent to pharmacy, pt verbalized understanding,

## 2022-11-08 NOTE — Addendum Note (Signed)
Addended by: Jake Seats on: 11/08/2022 01:09 PM   Modules accepted: Orders

## 2022-11-08 NOTE — Telephone Encounter (Signed)
Pls let her know the EEG did not show any seizure activity. I am not quite sure the jerking in her hands is all related to seizure, sometimes Restless Leg syndrome can also affect other limbs. We can increase the Depakote 250mg : take 2 tabs in AM, 3 tabs in PM (pls send updated Rx), but I would like to do a prolonged home 48-hour EEG to capture the jerking to see what the brain waves show when she is having the jerks. Thanks

## 2022-11-14 ENCOUNTER — Telehealth: Payer: Self-pay | Admitting: Neurology

## 2022-11-14 NOTE — Telephone Encounter (Signed)
Pt called no answer left a voice mail to call the office back  °

## 2022-11-14 NOTE — Procedures (Signed)
ELECTROENCEPHALOGRAM REPORT  Date of Study: 11/07/2022  Patient's Name: Beverly Tran MRN: [id number] Date of Birth: 1971-06-21  Referring Provider: Dr. Ellouise Newer  Clinical History: This is a 52 year old woman with episodes of staring and body jerking. EEG for classification.  Medications: Depakote, clonazepam, Gabapentin, Crestor, Mirapex, Metformin, Actos  Technical Summary: A multichannel digital EEG recording measured by the international 10-20 system with electrodes applied with paste and impedances below 5000 ohms performed in our laboratory with EKG monitoring in an awake and asleep patient.  Hyperventilation was not performed. Photic stimulation was performed.  The digital EEG was referentially recorded, reformatted, and digitally filtered in a variety of bipolar and referential montages for optimal display.    Description: The patient is awake and asleep during the recording.  During maximal wakefulness, there is a symmetric, medium voltage 9 Hz posterior dominant rhythm that attenuates with eye opening.  The record is symmetric.  There is an excess amount of diffuse low voltage beta activity seen throughout the recording. During drowsiness and sleep, there is an increase in theta slowing of the background, at times sharply contoured over the right temporal region without clear epileptogenic potential. Vertex waves and symmetric sleep spindles were seen.  Photic stimulation did not elicit any abnormalities.  There were no epileptiform discharges or electrographic seizures seen.    EKG lead was unremarkable.  Impression: This awake and asleep EEG is normal except for excess amount of diffuse low voltage beta activity.  Clinical Correlation: Diffuse low voltage beta activity is commonly seen with sedating medications such as benzodiazepines.  In the absence of sedating medications, anxiety and hyperthyroidism may produce generalized beta activity.  The absence of epileptiform  discharges does not exclude a clinical diagnosis of epilepsy.  If further clinical questions remain, prolonged EEG may be helpful.  Clinical correlation is advised.   Ellouise Newer, M.D.

## 2022-11-14 NOTE — Telephone Encounter (Signed)
Patient states that she is having trouble holding things and whole body hurts and she is jerking  and needs to know what she needs to do   Please call

## 2022-11-15 NOTE — Telephone Encounter (Signed)
Spoke with called she stated that this has been going on for a week she has weakness that is on both sides but she is dropping thing and she had a fall in the bathroom yesterday and scrapped her knees, she does not have slurred speech buts she is stuttering and having a hard time finding her words she stated that she dose not see a facial droop. Pt husband was in the room with her I talk to him he stated that last year she had a stroke they didn't know until they got to the hospital, he was advised to take her to the hospital for evaluation with her having the weakness and dropping things when she tries to pick them up and with the history of a stroke she could be having mini strokes called TIAs and she needs to be evaluated and he stated he would take her right now to be seen.

## 2022-11-16 ENCOUNTER — Other Ambulatory Visit: Payer: Self-pay

## 2022-11-16 ENCOUNTER — Telehealth: Payer: Self-pay

## 2022-11-16 DIAGNOSIS — R252 Cramp and spasm: Secondary | ICD-10-CM

## 2022-11-16 DIAGNOSIS — R404 Transient alteration of awareness: Secondary | ICD-10-CM

## 2022-11-16 NOTE — Telephone Encounter (Signed)
Ok to send order for 72-hour EEG with [facility], thanks

## 2022-11-16 NOTE — Telephone Encounter (Signed)
Pt went to the hospital she had a CT scan it was normal she stated that they told her it was body spasms and they did blood work. Hospital notes are in [place]. She said the spasms are worse in the morning. She stated that the ER told her she was miss diagnosed with seizures? But she would like to do the prolonged EEG she needs to have 4 days notice to have transportation set up.

## 2022-11-16 NOTE — Telephone Encounter (Signed)
Pt informed that we ordered the EEG with [facility] and she was reminded that she had talked to me this morning,

## 2022-11-16 NOTE — Telephone Encounter (Signed)
Order sent to [facility]

## 2022-11-16 NOTE — Telephone Encounter (Signed)
Patient is calling in asking to speak with Beverly Tran about yesterdays ED visit.

## 2022-11-16 NOTE — Telephone Encounter (Signed)
Error

## 2022-11-17 ENCOUNTER — Telehealth: Payer: Self-pay | Admitting: Neurology

## 2022-11-17 ENCOUNTER — Telehealth: Payer: Self-pay | Admitting: Internal Medicine

## 2022-11-17 NOTE — Telephone Encounter (Signed)
New neurologist last week sent patient to ER, she went to ER and while she was there the ER doctors asked her why she was on oxygen and she went over that it was due to her oxygen dropping on her sleep study. The ER doctor told her that was the same thing as COPD.   I explained to her that her oxygen dropping at night was not the same thing as COPD. But she asked if I could ask Dr [name] if he can look to see any diagnosis of COPD.   Dr [name] can you look at chart and see if she has any diagnosis of COPD.

## 2022-11-17 NOTE — Telephone Encounter (Signed)
Patient would like the nurse or doctor to call her to find out if she has COPD.  She stated that she was diagnosed at the hospital and that her lung doctor never told her that.  Please call to discuss.  CB# [phone]

## 2022-11-17 NOTE — Telephone Encounter (Signed)
We have ordered pfts multiple times to sort out this question and I doubt she has much copd but need pfts done to be definitive about it > ok with me to reschedule 1st available and make sure she has f/u ov with me

## 2022-11-17 NOTE — Telephone Encounter (Signed)
Pt called in to give an alternate phone number for her because her phone is currently out of minutes. Pt would like the company who is supposed to schedule her at home EEG to have the alternate number of [phone].

## 2022-11-17 NOTE — Telephone Encounter (Signed)
Phone number updated on [facility] paperwork

## 2022-11-18 NOTE — Telephone Encounter (Signed)
I spoke to Beverly Tran and informed her of Dr Gustavus Bryant interruption of her COPD and Beverly Tran verbalized understanding. And will make and appt with Dr [name] in [place] and PFT next month. Nothing further needed.

## 2022-11-21 ENCOUNTER — Telehealth: Payer: Self-pay

## 2022-11-21 NOTE — Telephone Encounter (Signed)
Patient is calling in asking to speak with Nira Conn in regards to doing the at home EEG. Requesting an update on the company's information. Using a friends phone today, can call her back at [phone]

## 2022-11-21 NOTE — Telephone Encounter (Signed)
[employer] sent a message with updated phone number

## 2022-11-22 ENCOUNTER — Ambulatory Visit: Payer: Medicaid Other | Admitting: Gastroenterology

## 2022-11-25 ENCOUNTER — Telehealth: Payer: Self-pay | Admitting: *Deleted

## 2022-11-25 NOTE — Telephone Encounter (Signed)
Called and notified patient. Letter typed and printed. Patient asked that we mail letter. Letter placed in outgoing mail basket.

## 2022-11-25 NOTE — Telephone Encounter (Signed)
Onset should be clear from my 1st note on her   Ok to write letter

## 2022-11-25 NOTE — Telephone Encounter (Signed)
Called and spoke with patient and she states her caseworker needs a letter from her physician to keep getting food stamps.  She states she needs a letter saying that she is physically and mentally unable to work. She has a requirement for O2. She needs the letter to state when her problems started. She wants to know if Dr. [name] will provide the letter for her caseworker.  Please advise.

## 2022-12-02 NOTE — Telephone Encounter (Signed)
Pt called stating she has questions regarding a letter she received about her appointment for an EEG.

## 2022-12-02 NOTE — Telephone Encounter (Signed)
Pt called no answer left a voice mail that the letter she got was a reminder of date and time they would be at your house,

## 2022-12-05 ENCOUNTER — Telehealth: Payer: Self-pay | Admitting: Internal Medicine

## 2022-12-08 ENCOUNTER — Telehealth: Payer: Self-pay

## 2022-12-08 DIAGNOSIS — J869 Pyothorax without fistula: Secondary | ICD-10-CM

## 2022-12-08 NOTE — Telephone Encounter (Signed)
Called and spoke to patient she is wanting to change DMEs from [employer] to [employer] for her O2. She states [employer] never has her preferred tubing.   Will route to [facility] for follow up. Order placed.

## 2022-12-09 ENCOUNTER — Telehealth: Payer: Self-pay

## 2022-12-09 NOTE — Telephone Encounter (Signed)
Beverly Tran is going to look into it and call us back,

## 2022-12-09 NOTE — Telephone Encounter (Signed)
Patient called in asking if Dr.Aquino would be able to up her dosage on Klonopin as the jerking has calmed down some but feels like it could be better.

## 2022-12-09 NOTE — Telephone Encounter (Signed)
Patient is calling in wanting to advise [place] that the [employer] people never showed up. Has attempted to call them but has not been successful, states it will dial for a second but then will end the call. Has a letter that states it was approved for this date but if anything further it would require a new authorization.

## 2022-12-09 NOTE — Telephone Encounter (Signed)
Beverly Tran called to find out information on EEG no answer left a voice mail to call the office back

## 2022-12-12 ENCOUNTER — Telehealth: Payer: Self-pay | Admitting: Internal Medicine

## 2022-12-12 NOTE — Telephone Encounter (Signed)
Would like to find out why she is jerking. Clonazepam will not be increased, we may do other medications if needed, depending on what we find out to be the cause of jerking.

## 2022-12-12 NOTE — Telephone Encounter (Signed)
Pls let patient know [employer] did come to her house but no one answered for 30 mins. She needs to coordinate with them better.   We need more information about her symptoms so we can know how to treat her better. Increasing clonazepam is not the ideal way to go, it can cause dependence on the medication. If these are seizures, seizure medication would be more helpful. If they are not seizures, then we will have to address it differently. Thanks

## 2022-12-12 NOTE — Telephone Encounter (Signed)
See other phone note

## 2022-12-12 NOTE — Telephone Encounter (Signed)
Pt missed her appointment for EEG [facility] is going to reach back out to her to get her scheduled, pt is also asking if Dr Delice Lesch will increase her Clonazepam

## 2022-12-13 ENCOUNTER — Encounter: Payer: Self-pay | Admitting: Gastroenterology

## 2022-12-13 ENCOUNTER — Ambulatory Visit: Payer: Medicaid Other | Admitting: Gastroenterology

## 2022-12-13 ENCOUNTER — Telehealth: Payer: Self-pay

## 2022-12-13 NOTE — Telephone Encounter (Signed)
Pt called an informed that Dr Delice Lesch stated she Would like to find out why she is jerking. Clonazepam will not be increased, we may do other medications if needed, depending on what we find out to be the cause of jerking

## 2022-12-13 NOTE — Telephone Encounter (Signed)
Her problem is poorly controlled Asthmatic bronchitis and is has been severe/ refractory since I've been seeing her so unlikely she would be able to work,

## 2022-12-13 NOTE — Telephone Encounter (Signed)
Larkin Ina from [place] with [facility] called and he needs clarification from Dr. [name] about when her onset of symptoms started preventing her from working and if it will be chronic or not.  Advised him that onset per last phone note was when she was first seen June 2021.  Larkin Ina needs to know if her condition preventing her from working is chronic or not.  We can reach [place] at [phone] ext [phone] Please advise.

## 2022-12-13 NOTE — Telephone Encounter (Signed)
[facility] Justin to notify. Left detailed vm with message from Dr. [name] and advised him to call back if he needed further documentation or had questions. Nothing further needed at this time.

## 2022-12-14 ENCOUNTER — Telehealth: Payer: Self-pay

## 2022-12-14 DIAGNOSIS — R252 Cramp and spasm: Secondary | ICD-10-CM

## 2022-12-14 NOTE — Telephone Encounter (Signed)
Beverly Tran from [place] called back this morning and asked for further documentation to be faxed to [phone].  Faxed encounter to [place] and received confirmation via fax.

## 2022-12-14 NOTE — Telephone Encounter (Signed)
Patient is calling in stating that her jerking has increased and states she fell twice today. When she was getting up from the toilet she fell forward, then 30 minutes later fell in the bedroom. Said she did not hit her head or blackout but is concerned that that her jerking is this bad.

## 2022-12-14 NOTE — Telephone Encounter (Signed)
Pt called she [facility]

## 2022-12-14 NOTE — Telephone Encounter (Signed)
Has seh been able to schedule with [facility] yet? If not, I would do an inpatient video EEG study where she will be admitted in the hospital to find out what is causing these jerking episodes, especially since she is falling.

## 2022-12-15 ENCOUNTER — Encounter: Payer: Self-pay | Admitting: Radiology

## 2022-12-19 ENCOUNTER — Telehealth: Payer: Self-pay | Admitting: Internal Medicine

## 2022-12-19 NOTE — Telephone Encounter (Signed)
Patient called after hours answering service, states "she has a chest cold that is causing her to cough a lot and it's keeping her up at night. Also has other sinus issues." Please advise.

## 2022-12-19 NOTE — Telephone Encounter (Signed)
Zpak Mucinex D (otc but has to sign for it ) prn
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
## 2022-12-20 ENCOUNTER — Ambulatory Visit (HOSPITAL_COMMUNITY): Admission: RE | Admit: 2022-12-20 | Payer: Medicaid Other | Source: Ambulatory Visit

## 2022-12-20 ENCOUNTER — Telehealth: Payer: Self-pay | Admitting: Neurology

## 2022-12-20 MED ORDER — AZITHROMYCIN 250 MG PO TABS: ORAL_TABLET | ORAL | 0 refills | Status: DC

## 2022-12-20 NOTE — Telephone Encounter (Signed)
Pt called in stating she is going to be doing her inpatient EEG on 12/26/22 and her klonopin can't be refilled until 12/28/22. The pharmacist stated Dr. Delice Lesch would have to approve her to get it on 12/24/22. She has to get it 12/24/22 because her pharmacy is not open in Sundays and she her transportation picks her up at 6:30 AM the day of the EEG.

## 2022-12-20 NOTE — Telephone Encounter (Signed)
Pt called informed that  she will not be taking her own medications while she is admitted to the hospital. Depending on EEG results, dose may be changed anyway. She should have enough clonazepam until hospital admission on 3/11.  pt verbalized understanding

## 2022-12-20 NOTE — Telephone Encounter (Signed)
Pls let her know that she will not be taking her own medications while she is admitted to the hospital. Depending on EEG results, dose may be changed anyway. She should have enough clonazepam until hospital admission on 3/11.

## 2022-12-20 NOTE — Telephone Encounter (Signed)
ATC patient. Per DPR, left detailed vm letting patient know I would send in a z-pak for her to her pharmacy and that Dr. [name] wants her to also take mucinex-D otc as needed. Rx has been sent to patients pharmacy.   Nothing further needed.

## 2022-12-24 ENCOUNTER — Telehealth: Payer: Self-pay | Admitting: Student

## 2022-12-24 MED ORDER — SYMBICORT 80-4.5 MCG/ACT IN AERO: INHALATION_SPRAY | RESPIRATORY_TRACT | 11 refills | Status: DC

## 2022-12-24 NOTE — Telephone Encounter (Signed)
Called into access line asking for rx for symbicort [facility] [address], rx filled

## 2022-12-26 ENCOUNTER — Other Ambulatory Visit: Payer: Self-pay

## 2022-12-26 ENCOUNTER — Encounter (HOSPITAL_COMMUNITY): Payer: Self-pay | Admitting: Neurology

## 2022-12-26 ENCOUNTER — Inpatient Hospital Stay (HOSPITAL_COMMUNITY): Payer: Medicaid Other

## 2022-12-26 ENCOUNTER — Inpatient Hospital Stay (HOSPITAL_COMMUNITY)
Admission: RE | Admit: 2022-12-26 | Discharge: 2022-12-28 | DRG: 101 | Disposition: A | Discharge status: Home / Self Care | Payer: Medicaid Other | Source: Ambulatory Visit | Attending: Neurology | Admitting: Neurology

## 2022-12-26 ENCOUNTER — Ambulatory Visit: Payer: 59 | Admitting: Internal Medicine

## 2022-12-26 DIAGNOSIS — K219 Gastro-esophageal reflux disease without esophagitis: Secondary | ICD-10-CM | POA: Diagnosis present

## 2022-12-26 DIAGNOSIS — E7800 Pure hypercholesterolemia, unspecified: Secondary | ICD-10-CM | POA: Diagnosis not present

## 2022-12-26 DIAGNOSIS — Z888 Allergy status to other drugs, medicaments and biological substances status: Secondary | ICD-10-CM | POA: Diagnosis not present

## 2022-12-26 DIAGNOSIS — Z886 Allergy status to analgesic agent status: Secondary | ICD-10-CM | POA: Diagnosis not present

## 2022-12-26 DIAGNOSIS — I10 Essential (primary) hypertension: Secondary | ICD-10-CM | POA: Diagnosis not present

## 2022-12-26 DIAGNOSIS — G43909 Migraine, unspecified, not intractable, without status migrainosus: Secondary | ICD-10-CM | POA: Diagnosis present

## 2022-12-26 DIAGNOSIS — E119 Type 2 diabetes mellitus without complications: Secondary | ICD-10-CM | POA: Diagnosis present

## 2022-12-26 DIAGNOSIS — Z87891 Personal history of nicotine dependence: Secondary | ICD-10-CM

## 2022-12-26 DIAGNOSIS — J45909 Unspecified asthma, uncomplicated: Secondary | ICD-10-CM | POA: Diagnosis not present

## 2022-12-26 DIAGNOSIS — Z8249 Family history of ischemic heart disease and other diseases of the circulatory system: Secondary | ICD-10-CM

## 2022-12-26 DIAGNOSIS — G4700 Insomnia, unspecified: Secondary | ICD-10-CM | POA: Diagnosis present

## 2022-12-26 DIAGNOSIS — Z79899 Other long term (current) drug therapy: Secondary | ICD-10-CM | POA: Diagnosis not present

## 2022-12-26 DIAGNOSIS — Z7951 Long term (current) use of inhaled steroids: Secondary | ICD-10-CM | POA: Diagnosis not present

## 2022-12-26 DIAGNOSIS — R569 Unspecified convulsions: Principal | ICD-10-CM | POA: Diagnosis present

## 2022-12-26 DIAGNOSIS — Z7984 Long term (current) use of oral hypoglycemic drugs: Secondary | ICD-10-CM

## 2022-12-26 DIAGNOSIS — G2581 Restless legs syndrome: Secondary | ICD-10-CM | POA: Diagnosis present

## 2022-12-26 LAB — COMPREHENSIVE METABOLIC PANEL
ALT: 18 U/L (ref 0–44)
AST: 22 U/L (ref 15–41)
Albumin: 3.5 g/dL (ref 3.5–5.0)
Alkaline Phosphatase: 41 U/L (ref 38–126)
Anion gap: 18 — ABNORMAL HIGH (ref 5–15)
BUN: 14 mg/dL (ref 6–20)
CO2: 25 mmol/L (ref 22–32)
Calcium: 9.1 mg/dL (ref 8.9–10.3)
Chloride: 94 mmol/L — ABNORMAL LOW (ref 98–111)
Creatinine, Ser: 0.86 mg/dL (ref 0.44–1.00)
GFR, Estimated: >60 mL/min (ref >60)
Glucose, Bld: 109 mg/dL — ABNORMAL HIGH (ref 70–99)
Potassium: 3.6 mmol/L (ref 3.5–5.1)
Sodium: 137 mmol/L (ref 135–145)
Total Bilirubin: 0.4 mg/dL (ref 0.3–1.2)
Total Protein: 6.6 g/dL (ref 6.5–8.1)

## 2022-12-26 LAB — CBC WITH DIFFERENTIAL/PLATELET
Abs Immature Granulocytes: 0.04 10*3/uL (ref 0.00–0.07)
Basophils Absolute: 0.1 10*3/uL (ref 0.0–0.1)
Basophils Relative: 1 %
Eosinophils Absolute: 0.2 10*3/uL (ref 0.0–0.5)
Eosinophils Relative: 2 %
HCT: 42.9 % (ref 36.0–46.0)
Hemoglobin: 13.9 g/dL (ref 12.0–15.0)
Immature Granulocytes: 1 %
Lymphocytes Relative: 41 %
Lymphs Abs: 3.7 10*3/uL (ref 0.7–4.0)
MCH: 32 pg (ref 26.0–34.0)
MCHC: 32.4 g/dL (ref 30.0–36.0)
MCV: 98.8 fL (ref 80.0–100.0)
Monocytes Absolute: 0.6 10*3/uL (ref 0.1–1.0)
Monocytes Relative: 7 %
Neutro Abs: 4.4 10*3/uL (ref 1.7–7.7)
Neutrophils Relative %: 48 %
Platelets: 246 10*3/uL (ref 150–400)
RBC: 4.34 MIL/uL (ref 3.87–5.11)
RDW: 14.6 % (ref 11.5–15.5)
WBC: 8.9 10*3/uL (ref 4.0–10.5)
nRBC: 0 % (ref 0.0–0.2)

## 2022-12-26 LAB — PHOSPHORUS: Phosphorus: 4.3 mg/dL (ref 2.5–4.6)

## 2022-12-26 LAB — MAGNESIUM: Magnesium: 1.6 mg/dL — ABNORMAL LOW (ref 1.7–2.4)

## 2022-12-26 LAB — GLUCOSE, CAPILLARY
Glucose-Capillary: 180 mg/dL — ABNORMAL HIGH (ref 70–99)
Glucose-Capillary: 129 mg/dL — ABNORMAL HIGH (ref 70–99)
Glucose-Capillary: 160 mg/dL — ABNORMAL HIGH (ref 70–99)

## 2022-12-26 LAB — PROTIME-INR
INR: 0.9 (ref 0.8–1.2)
Prothrombin Time: 12.3 seconds (ref 11.4–15.2)

## 2022-12-26 MED ORDER — ORAL CARE MOUTH RINSE: 15.0000 mL | OROMUCOSAL | Status: DC | PRN

## 2022-12-26 MED ORDER — GABAPENTIN 300 MG PO CAPS
600.0000 mg | ORAL_CAPSULE | Freq: Three times a day (TID) | ORAL | Status: DC
  Administered 2022-12-26 – 2022-12-27 (×3): 600 mg via ORAL
  Filled 2022-12-26 (×3): qty 2

## 2022-12-26 MED ORDER — FUROSEMIDE 40 MG PO TABS
40.0000 mg | ORAL_TABLET | Freq: Every day | ORAL | Status: DC
  Administered 2022-12-26 – 2022-12-28 (×3): 40 mg via ORAL
  Filled 2022-12-26 (×3): qty 1

## 2022-12-26 MED ORDER — INSULIN ASPART 100 UNIT/ML IJ SOLN
0.0000 [IU] | Freq: Three times a day (TID) | INTRAMUSCULAR | Status: DC
  Administered 2022-12-26 – 2022-12-27 (×2): 1 [IU] via SUBCUTANEOUS
  Administered 2022-12-27 – 2022-12-28 (×3): 2 [IU] via SUBCUTANEOUS

## 2022-12-26 MED ORDER — HYDROCHLOROTHIAZIDE 12.5 MG PO TABS
12.5000 mg | ORAL_TABLET | Freq: Every day | ORAL | Status: DC
  Administered 2022-12-26 – 2022-12-28 (×3): 12.5 mg via ORAL
  Filled 2022-12-26 (×3): qty 1

## 2022-12-26 MED ORDER — OXYCODONE-ACETAMINOPHEN 7.5-325 MG PO TABS
1.0000 | ORAL_TABLET | Freq: Four times a day (QID) | ORAL | Status: DC | PRN
  Administered 2022-12-26 – 2022-12-28 (×6): 1 via ORAL
  Filled 2022-12-26 (×6): qty 1

## 2022-12-26 MED ORDER — LORAZEPAM 2 MG/ML IJ SOLN: 2.0000 mg | INTRAMUSCULAR | Status: DC | PRN

## 2022-12-26 MED ORDER — FERROUS SULFATE 325 (65 FE) MG PO TABS
325.0000 mg | ORAL_TABLET | Freq: Two times a day (BID) | ORAL | Status: DC
  Administered 2022-12-26 – 2022-12-28 (×4): 325 mg via ORAL
  Filled 2022-12-26 (×4): qty 1

## 2022-12-26 MED ORDER — INSULIN ASPART 100 UNIT/ML IJ SOLN: 0.0000 [IU] | INTRAMUSCULAR | Status: DC

## 2022-12-26 MED ORDER — ACETAMINOPHEN 325 MG PO TABS: 650.0000 mg | ORAL_TABLET | ORAL | Status: DC | PRN

## 2022-12-26 MED ORDER — MONTELUKAST SODIUM 10 MG PO TABS
10.0000 mg | ORAL_TABLET | Freq: Every day | ORAL | Status: DC
  Administered 2022-12-26 – 2022-12-27 (×2): 10 mg via ORAL
  Filled 2022-12-26 (×2): qty 1

## 2022-12-26 MED ORDER — ALBUTEROL SULFATE HFA 108 (90 BASE) MCG/ACT IN AERS: 2.0000 | INHALATION_SPRAY | Freq: Four times a day (QID) | RESPIRATORY_TRACT | Status: DC | PRN

## 2022-12-26 MED ORDER — DULAGLUTIDE 0.75 MG/0.5ML ~~LOC~~ SOAJ: 0.7500 mg | SUBCUTANEOUS | Status: DC

## 2022-12-26 MED ORDER — METFORMIN HCL 500 MG PO TABS
500.0000 mg | ORAL_TABLET | Freq: Two times a day (BID) | ORAL | Status: DC
  Administered 2022-12-26 – 2022-12-28 (×4): 500 mg via ORAL
  Filled 2022-12-26 (×4): qty 1

## 2022-12-26 MED ORDER — FAMOTIDINE 20 MG PO TABS
20.0000 mg | ORAL_TABLET | Freq: Every day | ORAL | Status: DC
  Administered 2022-12-26 – 2022-12-28 (×3): 20 mg via ORAL
  Filled 2022-12-26 (×3): qty 1

## 2022-12-26 MED ORDER — PANTOPRAZOLE SODIUM 40 MG PO TBEC
40.0000 mg | DELAYED_RELEASE_TABLET | Freq: Every day | ORAL | Status: DC
  Administered 2022-12-27 – 2022-12-28 (×2): 40 mg via ORAL
  Filled 2022-12-26 (×2): qty 1

## 2022-12-26 MED ORDER — CYCLOBENZAPRINE HCL 10 MG PO TABS: 10.0000 mg | ORAL_TABLET | Freq: Three times a day (TID) | ORAL | Status: DC | PRN

## 2022-12-26 MED ORDER — IPRATROPIUM-ALBUTEROL 0.5-2.5 (3) MG/3ML IN SOLN: 3.0000 mL | RESPIRATORY_TRACT | Status: DC | PRN

## 2022-12-26 MED ORDER — PROMETHAZINE HCL 25 MG PO TABS
25.0000 mg | ORAL_TABLET | Freq: Three times a day (TID) | ORAL | Status: DC | PRN
  Administered 2022-12-27 – 2022-12-28 (×4): 25 mg via ORAL
  Filled 2022-12-26 (×6): qty 1

## 2022-12-26 MED ORDER — TRAZODONE HCL 50 MG PO TABS
50.0000 mg | ORAL_TABLET | Freq: Every day | ORAL | Status: DC
  Administered 2022-12-26: 50 mg via ORAL
  Filled 2022-12-26: qty 1

## 2022-12-26 MED ORDER — ACETAMINOPHEN 650 MG RE SUPP: 650.0000 mg | RECTAL | Status: DC | PRN

## 2022-12-26 MED ORDER — PIOGLITAZONE HCL 30 MG PO TABS
45.0000 mg | ORAL_TABLET | Freq: Every day | ORAL | Status: DC
  Administered 2022-12-27 – 2022-12-28 (×2): 45 mg via ORAL
  Filled 2022-12-26 (×2): qty 1

## 2022-12-26 MED ORDER — ALBUTEROL SULFATE (2.5 MG/3ML) 0.083% IN NEBU: 2.5000 mg | INHALATION_SOLUTION | Freq: Four times a day (QID) | RESPIRATORY_TRACT | Status: DC | PRN

## 2022-12-26 MED ORDER — BACLOFEN 10 MG PO TABS
10.0000 mg | ORAL_TABLET | Freq: Two times a day (BID) | ORAL | Status: DC
  Administered 2022-12-26 – 2022-12-28 (×4): 10 mg via ORAL
  Filled 2022-12-26 (×4): qty 1

## 2022-12-26 MED ORDER — ENOXAPARIN SODIUM 40 MG/0.4ML IJ SOSY
40.0000 mg | PREFILLED_SYRINGE | INTRAMUSCULAR | Status: DC
  Administered 2022-12-26 – 2022-12-28 (×3): 40 mg via SUBCUTANEOUS
  Filled 2022-12-26 (×3): qty 0.4

## 2022-12-26 MED ORDER — PRAMIPEXOLE DIHYDROCHLORIDE 1 MG PO TABS
1.0000 mg | ORAL_TABLET | Freq: Every day | ORAL | Status: DC
  Administered 2022-12-26 – 2022-12-27 (×2): 1 mg via ORAL
  Filled 2022-12-26 (×4): qty 1

## 2022-12-26 MED ORDER — IRBESARTAN 150 MG PO TABS
150.0000 mg | ORAL_TABLET | Freq: Every day | ORAL | Status: DC
  Administered 2022-12-26 – 2022-12-28 (×3): 150 mg via ORAL
  Filled 2022-12-26 (×3): qty 1

## 2022-12-26 MED ORDER — OLMESARTAN MEDOXOMIL-HCTZ 20-12.5 MG PO TABS: 1.0000 | ORAL_TABLET | Freq: Every day | ORAL | Status: DC

## 2022-12-26 MED ORDER — PRAMIPEXOLE DIHYDROCHLORIDE 0.25 MG PO TABS
0.5000 mg | ORAL_TABLET | Freq: Every morning | ORAL | Status: DC
  Administered 2022-12-27 – 2022-12-28 (×2): 0.5 mg via ORAL
  Filled 2022-12-26 (×2): qty 2

## 2022-12-26 MED ORDER — SODIUM CHLORIDE 0.9% FLUSH
3.0000 mL | Freq: Two times a day (BID) | INTRAVENOUS | Status: DC
  Administered 2022-12-26 – 2022-12-27 (×3): 3 mL via INTRAVENOUS

## 2022-12-26 MED ORDER — ROSUVASTATIN CALCIUM 5 MG PO TABS
10.0000 mg | ORAL_TABLET | Freq: Every day | ORAL | Status: DC
  Administered 2022-12-26 – 2022-12-28 (×3): 10 mg via ORAL
  Filled 2022-12-26 (×3): qty 2

## 2022-12-26 MED ORDER — LABETALOL HCL 5 MG/ML IV SOLN: 5.0000 mg | INTRAVENOUS | Status: DC | PRN

## 2022-12-26 NOTE — Progress Notes (Cosign Needed)
  Transition of Care [facility]) Screening Note   Patient Details  Name: Beverly Tran Date of Birth: [birth year]   Transition of Care [facility]) CM/SW Contact:    Pollie Friar, RN Phone Number: 12/26/2022, 2:52 PM   Pt is from home. She is admitted for EMU. Transition of Care Department [facility]) has reviewed patient. We will continue to monitor patient advancement through interdisciplinary progression rounds. If new patient transition needs arise, please place a TOC consult.

## 2022-12-26 NOTE — Progress Notes (Signed)
EMU admission  LTM setup  All impedances below 10kohms.  [facility] notified.  Patient instructed of use of patient event.  Event button tested with [facility]

## 2022-12-26 NOTE — Progress Notes (Signed)
Direct admit received from home this morning.  Oriented to room and surroundings.  Stated her doctor scheduled this admission because she had a couple recent falls and was having "severe jerking spells" and they were thought to be seizure activity.  Significant other is with her and oriented to room as well.  Explained the process for the video EEG and that she is a high fall risk and that her alarm is to be on at all times when in the bed and staff will gladly help her up at any time.  Very pleasant and thankful.
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
## 2022-12-26 NOTE — Plan of Care (Signed)

## 2022-12-27 DIAGNOSIS — R569 Unspecified convulsions: Secondary | ICD-10-CM | POA: Diagnosis not present

## 2022-12-27 LAB — GLUCOSE, CAPILLARY
Glucose-Capillary: 195 mg/dL — ABNORMAL HIGH (ref 70–99)
Glucose-Capillary: 146 mg/dL — ABNORMAL HIGH (ref 70–99)
Glucose-Capillary: 180 mg/dL — ABNORMAL HIGH (ref 70–99)
Glucose-Capillary: 109 mg/dL — ABNORMAL HIGH (ref 70–99)

## 2022-12-27 MED ORDER — MOMETASONE FURO-FORMOTEROL FUM 100-5 MCG/ACT IN AERO
2.0000 | INHALATION_SPRAY | Freq: Two times a day (BID) | RESPIRATORY_TRACT | Status: DC
  Administered 2022-12-27 – 2022-12-28 (×2): 2 via RESPIRATORY_TRACT
  Filled 2022-12-27: qty 8.8

## 2022-12-27 MED ORDER — TRAZODONE HCL 50 MG PO TABS: 50.0000 mg | ORAL_TABLET | Freq: Every day | ORAL | Status: DC

## 2022-12-27 MED ORDER — GABAPENTIN 300 MG PO CAPS
300.0000 mg | ORAL_CAPSULE | Freq: Three times a day (TID) | ORAL | Status: AC
  Administered 2022-12-27 (×2): 300 mg via ORAL
  Filled 2022-12-27 (×2): qty 1

## 2022-12-27 NOTE — Progress Notes (Signed)
Subjective: Beverly Tran. No concerns.  ROS: negative except above Examination  Vital signs in last 24 hours: Temp:  [97.9 F (36.6 C)-98.9 F (37.2 C)] 97.9 F (36.6 C) (03/12 1101) Pulse Rate:  [80-98] 87 (03/12 1101) Resp:  [16-20] 16 (03/12 1101) BP: (107-126)/(57-78) 125/69 (03/12 1101) SpO2:  [85 %-95 %] 92 % (03/12 1101)  General: lying in bed, NAD Neuro:  AOx3, cranial nerves II through XII grossly intact, 5/5 in all 4 extremities, FTN intact bilaterally, sensory intact to light touch    Basic Metabolic Panel: Recent Labs  Lab 12/26/22 0905  NA 137  K 3.6  CL 94*  CO2 25  GLUCOSE 109*  BUN 14  CREATININE 0.86  CALCIUM 9.1  MG 1.6*  PHOS 4.3    CBC: Recent Labs  Lab 12/26/22 0905  WBC 8.9  NEUTROABS 4.4  HGB 13.9  HCT 42.9  MCV 98.8  PLT 246     Coagulation Studies: Recent Labs    12/26/22 0905  LABPROT 12.3  INR 0.9    Imaging No new brain imaging  ASSESSMENT AND PLAN: 52 year old female admitted to epilepsy monitoring unit for characterization of jerking in bilateral upper extremities.   Seizure-like episodes -Continue video monitoring for characterization of episodes -Hold Depakote and Klonopin today for seizure provocation. -Reduce gabapentin to '300mg'$  TID and hold tomorrow -HV and photic stimulation, sleep deprivation for seizure precautions -Seizure precautions -As needed IV Ativan 2 mg for clinical seizures   Hypertension Continue home meds   Diabetes -Continue home meds -Fingerstick to ACHS and sliding scale insulin   Hyperlipidemia - continue home meds   GERD -Continue home meds   Asthma - continue home meds   Insomnia - Hold trazodone due to sleep deprivation   Restless leg syndrome -Continue home meds  I have spent a total of  36  minutes with the patient reviewing hospital notes,  test results, labs and examining the patient as well as establishing an assessment and plan that was discussed personally with the  patient.  > 50% of time was spent in direct patient care.     Zeb Comfort Epilepsy [facility] For questions after 5pm please refer to AMION to reach the Neurologist on call

## 2022-12-27 NOTE — Procedures (Signed)
Patient Name: LAJEANA BEAUDRIE  MRN: [id number]  Epilepsy Attending: Lora Havens  Referring Physician/Provider: Lora Havens, MD  Duration: 12/26/2022 0857 to 12/27/2022 0857  Patient history: 52 year old female admitted to epilepsy monitoring unit for characterization of jerking in bilateral upper extremities. EEG to evaluate for seizure  Level of alertness: Awake, asleep  AEDs during EEG study: GBP  Technical aspects: This EEG study was done with scalp electrodes positioned according to the 10-20 International system of electrode placement. Electrical activity was reviewed with band pass filter of 1-'70Hz'$ , sensitivity of 7 uV/mm, display speed of 48m/sec with a '60Hz'$  notched filter applied as appropriate. EEG data were recorded continuously and digitally stored.  Video monitoring was available and reviewed as appropriate.  Description: The posterior dominant rhythm consists of 8-9 Hz activity of moderate voltage (25-35 uV) seen predominantly in posterior head regions, symmetric and reactive to eye opening and eye closing. Sleep was characterized by vertex waves, sleep spindles (12 to 14 Hz), maximal frontocentral region.  Hyperventilation and photic stimulation were not performed.     IMPRESSION: This study is within normal limits. No seizures or epileptiform discharges were seen throughout the recording.  A normal interictal EEG does not exclude the diagnosis of epilepsy.  Lenell Mcconnell OBarbra Sarks

## 2022-12-27 NOTE — Progress Notes (Signed)
LTM maint complete - no skin breakdown [facility] monitored, Event button test confirmed by [facility]. ? ?

## 2022-12-27 NOTE — Plan of Care (Signed)
  Problem: Activity: Goal: Risk for activity intolerance will decrease Outcome: Progressing   

## 2022-12-28 DIAGNOSIS — R569 Unspecified convulsions: Secondary | ICD-10-CM | POA: Diagnosis not present

## 2022-12-28 LAB — GLUCOSE, CAPILLARY
Glucose-Capillary: 117 mg/dL — ABNORMAL HIGH (ref 70–99)
Glucose-Capillary: 195 mg/dL — ABNORMAL HIGH (ref 70–99)

## 2022-12-28 MED ORDER — DIVALPROEX SODIUM ER 500 MG PO TB24
500.0000 mg | ORAL_TABLET | ORAL | Status: AC
  Administered 2022-12-28: 500 mg via ORAL
  Filled 2022-12-28: qty 1

## 2022-12-28 MED ORDER — GABAPENTIN 300 MG PO CAPS
600.0000 mg | ORAL_CAPSULE | ORAL | Status: AC
  Administered 2022-12-28: 600 mg via ORAL
  Filled 2022-12-28: qty 2

## 2022-12-28 NOTE — Discharge Instructions (Addendum)
You were admitted to epilepsy monitoring unit from 12/26/2022 to 12/28/2022.  During this time, you underwent continuous video EEG monitoring. Your antiseizure medications including Depakote, Klonopin and gabapentin were held. Photic stimulation and sleep deprivation were performed.  No typical events were recorded.  Your EEG did not show any evidence of epilepsy.  We recommended continuing epilepsy monitoring unit stay but you requested to be discharged today.  Therefore, your home medications were resumed.  Continue follow-up with Dr. Delice Lesch.  Also encouraged follow-up with psychiatry and due to potential nonepileptic events none chronic history of anxiety.  Seizure precautions including do not drive or discussed

## 2022-12-28 NOTE — Progress Notes (Signed)
Discharge papers given to patient. Belongings returned. IV removed and telemetry discontinued. Patient wheeled off unit for transport home.  Gwendolyn Grant, RN

## 2022-12-28 NOTE — TOC Transition Note (Signed)
Transition of Care [facility]) - CM/SW Discharge Note   Patient Details  Name: JEKIA AMER MRN: [id number] Date of Birth: [birth year]  [facility] CM/SW Contact:  Pollie Friar, RN Phone Number: 12/28/2022, 12:41 PM   Clinical Narrative:    Pt is discharging home with her spouse. No needs per TOC.  Pt has transport home.   Final next level of care: Home/Self Care Barriers to Discharge: No Barriers Identified   Patient Goals and CMS Choice      Discharge Placement                         Discharge Plan and Services Additional resources added to the After Visit Summary for                                       Social Determinants of Health (SDOH) Interventions SDOH Screenings   Food Insecurity: No Food Insecurity (12/26/2022)  Housing: Low Risk  (12/26/2022)  Transportation Needs: No Transportation Needs (12/26/2022)  Utilities: Not At Risk (12/26/2022)  Depression (PHQ2-9): Low Risk  (03/17/2022)  Tobacco Use: Medium Risk (12/26/2022)     Readmission Risk Interventions    03/04/2022   12:16 PM  Readmission Risk Prevention Plan  Transportation Screening Complete  PCP or Specialist Appt within 3-5 Days Complete  HRI or [place] Complete  Social Work Consult for [place] Planning/Counseling Complete  Palliative Care Screening Not Applicable  Medication Review Press photographer) Complete

## 2022-12-28 NOTE — Progress Notes (Signed)
EMU LTM maint complete - no skin breakdown under: Fp2 Fp1 [facility] monitored, Event button test confirmed by [facility].

## 2022-12-28 NOTE — Progress Notes (Signed)
LTM EEG disconnected - no skin breakdown at unhook.  

## 2022-12-28 NOTE — Procedures (Signed)
Patient Name: Beverly Tran  MRN: [id number]  Epilepsy Attending: Lora Havens  Referring Physician/Provider: Lora Havens, MD  Duration: 12/28/2022 0857 to 12/28/2022 1339   Patient history: 52 year old female admitted to epilepsy monitoring unit for characterization of jerking in bilateral upper extremities. EEG to evaluate for seizure   Level of alertness: Awake, asleep   AEDs during EEG study: GBP   Technical aspects: This EEG study was done with scalp electrodes positioned according to the 10-20 International system of electrode placement. Electrical activity was reviewed with band pass filter of 1-'70Hz'$ , sensitivity of 7 uV/mm, display speed of 63m/sec with a '60Hz'$  notched filter applied as appropriate. EEG data were recorded continuously and digitally stored.  Video monitoring was available and reviewed as appropriate.   Description: The posterior dominant rhythm consists of 8-9 Hz activity of moderate voltage (25-35 uV) seen predominantly in posterior head regions, symmetric and reactive to eye opening and eye closing. Sleep was characterized by vertex waves, sleep spindles (12 to 14 Hz), maximal frontocentral region. EEG showed intermittent generalized 2 to 3 Hz delta slowing.  Photic driving was not seen during photic stimulation. Hyperventilation was not performed due to severe asthma.   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 No seizures or epileptiform discharges were seen throughout the recording.   Kyian Obst OBarbra Sarks

## 2022-12-28 NOTE — Procedures (Addendum)
Patient Name: Beverly Tran  MRN: [id number]  Epilepsy Attending: Lora Havens  Referring Physician/Provider: Lora Havens, MD  Duration: 12/27/2022 0857 to 12/28/2022 0857   Patient history: 52 year old female admitted to epilepsy monitoring unit for characterization of jerking in bilateral upper extremities. EEG to evaluate for seizure   Level of alertness: Awake, asleep   AEDs during EEG study: GBP   Technical aspects: This EEG study was done with scalp electrodes positioned according to the 10-20 International system of electrode placement. Electrical activity was reviewed with band pass filter of 1-'70Hz'$ , sensitivity of 7 uV/mm, display speed of 48m/sec with a '60Hz'$  notched filter applied as appropriate. EEG data were recorded continuously and digitally stored.  Video monitoring was available and reviewed as appropriate.   Description: The posterior dominant rhythm consists of 8-9 Hz activity of moderate voltage (25-35 uV) seen predominantly in posterior head regions, symmetric and reactive to eye opening and eye closing. Sleep was characterized by vertex waves, sleep spindles (12 to 14 Hz), maximal frontocentral region. EEG showed intermittent generalized 2 to 3 Hz delta slowing.  Photic driving was not seen during photic stimulation. Hyperventilation was not performed due to severe asthma.  ABNORMALITY - Intermittent slow, generalized  Patient IMPRESSION: This study is suggestive of mild diffuse encephalopathy. No seizures or epileptiform discharges were seen throughout the recording.   A normal interictal EEG does not exclude the diagnosis of epilepsy.   Trayquan Kolakowski OBarbra Sarks

## 2022-12-28 NOTE — Progress Notes (Signed)
Miant done, no skin breakdown fp2, fz, t6, imp below 10

## 2022-12-28 NOTE — Discharge Summary (Signed)
Physician Discharge Summary  Patient ID: Beverly Tran MRN: [id number] DOB/AGE: 02-19-1971 52 y.o.  Admit date: 12/26/2022 Discharge date: 12/28/2022  Admission Diagnoses: Seizure  Discharge Diagnoses: Seizure-like activity  Discharged Condition: stable  Hospital Course: Beverly Tran was admitted to epilepsy monitoring unit from 12/26/2022 to 12/28/2022.  During this time, she underwent continuous video EEG monitoring.  Antiseizure medications including Depakote, Klonopin and gabapentin were held.  Photic stimulation and sleep deprivation were performed (hyperventilation not performed due to severe asthma)..  Consults: None  Significant Diagnostic Studies: EEG   Description: The posterior dominant rhythm consists of 8-9 Hz activity of moderate voltage (25-35 uV) seen predominantly in posterior head regions, symmetric and reactive to eye opening and eye closing. Sleep was characterized by vertex waves, sleep spindles (12 to 14 Hz), maximal frontocentral region. EEG showed intermittent generalized 2 to 3 Hz delta slowing.  Photic driving was not seen during photic stimulation. Hyperventilation was not performed due to severe asthma.  No ictal-interictal activity was noted.  No typical events were recorded.  Patient requested to be discharged on 12/28/2022 after 48 hours of monitoring.  Discussed that with the information we have so far, it is unlikely that patient has epilepsy however, cannot completely rule out the diagnosis.  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  Because of limited data, her home medications including Depakote were resumed.  Discussed seizure precautions including do not drive.   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 No seizures or epileptiform discharges were seen throughout the recording.  Treatments: Continue home  medications  Discharge Exam: Blood pressure (!) 141/80, pulse 100, temperature 98.4 F (36.9 C), temperature source Oral, resp. rate 12, height '5\' 3"'$  (1.6 m), weight 98 kg, last menstrual period 10/13/2015, SpO2 98 %.  General: lying in bed, NAD Neuro:  AOx3, cranial nerves II through XII grossly intact, 5/5 in all 4 extremities, FTN intact bilaterally, sensory intact to light touch   Discharge disposition: 01-Home or Self Care   Discharge Instructions     Diet - low sodium heart healthy   Complete by: As directed    Discharge instructions   Complete by: As directed    Seizure precautions: Per [facility] statutes, patients with seizures are not allowed to drive until they have been seizure-free for six months and cleared by a physician    Use caution when using heavy equipment or power tools. Avoid working on ladders or at heights. Take showers instead of baths. Ensure the water temperature is not too high on the home water heater. Do not go swimming alone. Do not lock yourself in a room alone (i.e. bathroom). When caring for infants or small children, sit down when holding, feeding, or changing them to minimize risk of injury to the child in the event you have a seizure. Maintain good sleep hygiene. Avoid alcohol.    If patient has another seizure, call 911 and bring them back to the ED if: A.  The seizure lasts longer than 5 minutes.      B.  The patient doesn't wake shortly after the seizure or has new problems such as difficulty seeing, speaking or moving following the seizure C.  The patient was injured during the seizure D.  The patient has a temperature over 102 F (39C) E.  The patient vomited during the seizure and now is having trouble breathing  Increase activity slowly   Complete by: As directed       Allergies as of 12/28/2022       Reactions   Bee Venom Anaphylaxis, Swelling   WASP/HORNET   Diclofenac Anaphylaxis   Salami [pickled Meat] Shortness Of  Breath, Swelling   Also Allergic to POPCORN: same reaction   Aspirin Other (See Comments)   Nose bleeds   Compazine [prochlorperazine] Hives   Imitrex [sumatriptan] Hives   Lamotrigine Other (See Comments)   confusion   Lisinopril     Unknown   Nubain [nalbuphine Hcl] Swelling, Other (See Comments)   Eye swelling   Thorazine [chlorpromazine] Hives   Topamax [topiramate] Other (See Comments)   Confusion and out of it   Voltaren [diclofenac Sodium] Swelling   Zofran [ondansetron Hcl] Nausea Only        Medication List     TAKE these medications    albuterol 108 (90 Base) MCG/ACT inhaler Commonly known as: VENTOLIN HFA Inhale 2 puffs into the lungs as needed for wheezing or shortness of breath.   azithromycin 250 MG tablet Commonly known as: Zithromax Take as directed   baclofen 10 MG tablet Commonly known as: LIORESAL Take 10 mg by mouth at bedtime.   clonazePAM 0.5 MG tablet Commonly known as: KLONOPIN Take 1 tablet (0.5 mg total) by mouth 2 (two) times daily.   cyclobenzaprine 10 MG tablet Commonly known as: FLEXERIL Take 1 tablet by mouth every 8 (eight) hours as needed.   divalproex 250 MG 24 hr tablet Commonly known as: DEPAKOTE ER Take 2 tablets in AM, 3 tablets in PM What changed:  how much to take how to take this when to take this additional instructions   EPINEPHrine 0.3 mg/0.3 mL Soaj injection Commonly known as: EPI-PEN Inject 0.3 mg into the muscle as needed for anaphylaxis.   famotidine 20 MG tablet Commonly known as: PEPCID Take 1 tablet (20 mg total) by mouth daily.   furosemide 40 MG tablet Commonly known as: LASIX Take 1 tablet (40 mg total) by mouth daily as needed for fluid or edema (as needed for leg swelling or worsening shortnes of breath.). What changed: when to take this   gabapentin 600 MG tablet Commonly known as: NEURONTIN Take 1 tablet (600 mg total) by mouth 3 (three) times daily.   ipratropium-albuterol 0.5-2.5 (3)  MG/3ML Soln Commonly known as: DUONEB Take 3 mLs by nebulization as needed (shortness of breath or wheezing).   lidocaine 5 % ointment Commonly known as: XYLOCAINE Apply topically daily as needed.   metFORMIN 500 MG tablet Commonly known as: GLUCOPHAGE Take 500 mg by mouth 2 (two) times daily with a meal.   montelukast 10 MG tablet Commonly known as: SINGULAIR Take 10 mg by mouth daily.   naloxone 4 MG/0.1ML Liqd nasal spray kit Commonly known as: NARCAN Place 0.4 mg into the nose once.   olmesartan-hydrochlorothiazide 20-12.5 MG tablet Commonly known as: BENICAR HCT Take 1 tablet by mouth daily.   oxyCODONE 5 MG immediate release tablet Commonly known as: Oxy IR/ROXICODONE Take 5 mg by mouth every 6 (six) hours as needed.   oxyCODONE-acetaminophen 7.5-325 MG tablet Commonly known as: PERCOCET Take 1 tablet by mouth every 6 (six) hours as needed for moderate pain.   pantoprazole 40 MG tablet Commonly known as: PROTONIX TAKE ONE TABLET BY MOUTH DAILY (MORNING) What changed: See the new instructions.   pioglitazone 45 MG tablet Commonly known as: ACTOS Take 45 mg by mouth daily.  pramipexole 1.5 MG tablet Commonly known as: MIRAPEX Take 1.5 mg by mouth 3 (three) times daily. What changed: Another medication with the same name was changed. Make sure you understand how and when to take each.   pramipexole 1 MG tablet Commonly known as: MIRAPEX Take 1/2 tablet in AM, 1 tablet in PM What changed:  how much to take how to take this when to take this additional instructions   promethazine 25 MG tablet Commonly known as: PHENERGAN TAKE ONE TABLET BY MOUTH EVERY 8 HOURS AS NEEDED FOR NAUSEA AND VOMITING What changed: See the new instructions.   rosuvastatin 10 MG tablet Commonly known as: CRESTOR Take 10 mg by mouth at bedtime.   Symbicort 80-4.5 MCG/ACT inhaler Generic drug: budesonide-formoterol Take 2 puffs first thing in am and then another 2 puffs about  12 hours later. What changed:  how much to take how to take this when to take this additional instructions   traZODone 50 MG tablet Commonly known as: DESYREL Take 50 mg by mouth at bedtime.   Trulicity 3 0000000 Sopn Generic drug: Dulaglutide Inject 3 mg into the skin once a week.       I have spent a total of 40   minutes with the patient reviewing hospital notes,  test results, labs and examining the patient as well as establishing an assessment and plan that was discussed personally with the patient.  > 50% of time was spent in direct patient care.    Signed: Lora Havens 12/28/2022, 12:14 PM

## 2022-12-28 NOTE — Progress Notes (Signed)
Patient asleep could not stay awake any longer.

## 2022-12-29 ENCOUNTER — Telehealth: Payer: Self-pay | Admitting: Internal Medicine

## 2022-12-29 ENCOUNTER — Telehealth: Payer: Self-pay

## 2022-12-29 MED ORDER — SYMBICORT 80-4.5 MCG/ACT IN AERO: INHALATION_SPRAY | RESPIRATORY_TRACT | 11 refills | Status: DC

## 2022-12-29 NOTE — Telephone Encounter (Signed)
Patient would like to schedule PFT. Patient states needs refill for Symbicort. Pharmacy is [facility]. Patient phone number is [phone].

## 2022-12-29 NOTE — Telephone Encounter (Signed)
Pt advised that I would call her once I get her results, she stated that they decreased her gabapentin to 300 mg and she feels good on that dose  and I told her to take her seizure medication as prescribed.

## 2022-12-29 NOTE — Telephone Encounter (Signed)
Order is placed for [facility] PFT. LVM at [id number] asking them to call patient to schedule PFT  Symbicort refilled to [facility].

## 2022-12-29 NOTE — Telephone Encounter (Addendum)
Patient called in stating that when she was at the hospital they lowered the Gabapentin, she had little to no muscle spasms  while there and was advised that they werent 100% positive that she does have seizures. That her symptoms may be related to the dosage of the medication. Asking if we would be able to lower the dosage of Gabapentin and if she needs to continue taking her seizure medication.

## 2022-12-30 ENCOUNTER — Other Ambulatory Visit: Payer: Self-pay

## 2022-12-30 ENCOUNTER — Encounter (HOSPITAL_COMMUNITY): Payer: Self-pay

## 2022-12-30 ENCOUNTER — Inpatient Hospital Stay (HOSPITAL_COMMUNITY)
Admission: EM | Admit: 2022-12-30 | Discharge: 2023-01-01 | DRG: 091 | Disposition: A | Discharge status: Home / Self Care | Payer: Medicaid Other | Source: Other Acute Inpatient Hospital | Attending: Internal Medicine | Admitting: Internal Medicine

## 2022-12-30 ENCOUNTER — Emergency Department (HOSPITAL_COMMUNITY): Payer: Medicaid Other

## 2022-12-30 DIAGNOSIS — F17210 Nicotine dependence, cigarettes, uncomplicated: Secondary | ICD-10-CM | POA: Diagnosis present

## 2022-12-30 DIAGNOSIS — G9341 Metabolic encephalopathy: Secondary | ICD-10-CM | POA: Diagnosis not present

## 2022-12-30 DIAGNOSIS — E785 Hyperlipidemia, unspecified: Secondary | ICD-10-CM

## 2022-12-30 DIAGNOSIS — Z8249 Family history of ischemic heart disease and other diseases of the circulatory system: Secondary | ICD-10-CM

## 2022-12-30 DIAGNOSIS — J449 Chronic obstructive pulmonary disease, unspecified: Secondary | ICD-10-CM | POA: Diagnosis not present

## 2022-12-30 DIAGNOSIS — D329 Benign neoplasm of meninges, unspecified: Secondary | ICD-10-CM | POA: Diagnosis present

## 2022-12-30 DIAGNOSIS — Z720 Tobacco use: Secondary | ICD-10-CM

## 2022-12-30 DIAGNOSIS — R4182 Altered mental status, unspecified: Secondary | ICD-10-CM | POA: Diagnosis present

## 2022-12-30 DIAGNOSIS — Z886 Allergy status to analgesic agent status: Secondary | ICD-10-CM | POA: Diagnosis not present

## 2022-12-30 DIAGNOSIS — M549 Dorsalgia, unspecified: Secondary | ICD-10-CM | POA: Diagnosis not present

## 2022-12-30 DIAGNOSIS — G2581 Restless legs syndrome: Secondary | ICD-10-CM | POA: Diagnosis present

## 2022-12-30 DIAGNOSIS — T50915A Adverse effect of multiple unspecified drugs, medicaments and biological substances, initial encounter: Secondary | ICD-10-CM | POA: Diagnosis present

## 2022-12-30 DIAGNOSIS — Z87820 Personal history of traumatic brain injury: Secondary | ICD-10-CM

## 2022-12-30 DIAGNOSIS — G4700 Insomnia, unspecified: Secondary | ICD-10-CM | POA: Diagnosis present

## 2022-12-30 DIAGNOSIS — Z91018 Allergy to other foods: Secondary | ICD-10-CM

## 2022-12-30 DIAGNOSIS — N179 Acute kidney failure, unspecified: Secondary | ICD-10-CM | POA: Diagnosis present

## 2022-12-30 DIAGNOSIS — E876 Hypokalemia: Secondary | ICD-10-CM | POA: Diagnosis present

## 2022-12-30 DIAGNOSIS — K219 Gastro-esophageal reflux disease without esophagitis: Secondary | ICD-10-CM | POA: Diagnosis present

## 2022-12-30 DIAGNOSIS — Z79899 Other long term (current) drug therapy: Secondary | ICD-10-CM

## 2022-12-30 DIAGNOSIS — Z7951 Long term (current) use of inhaled steroids: Secondary | ICD-10-CM

## 2022-12-30 DIAGNOSIS — M545 Low back pain, unspecified: Secondary | ICD-10-CM | POA: Diagnosis present

## 2022-12-30 DIAGNOSIS — E7800 Pure hypercholesterolemia, unspecified: Secondary | ICD-10-CM | POA: Diagnosis present

## 2022-12-30 DIAGNOSIS — E1169 Type 2 diabetes mellitus with other specified complication: Secondary | ICD-10-CM | POA: Diagnosis present

## 2022-12-30 DIAGNOSIS — Z7984 Long term (current) use of oral hypoglycemic drugs: Secondary | ICD-10-CM

## 2022-12-30 DIAGNOSIS — I10 Essential (primary) hypertension: Secondary | ICD-10-CM | POA: Diagnosis present

## 2022-12-30 DIAGNOSIS — G40909 Epilepsy, unspecified, not intractable, without status epilepticus: Secondary | ICD-10-CM | POA: Diagnosis present

## 2022-12-30 DIAGNOSIS — Z888 Allergy status to other drugs, medicaments and biological substances status: Secondary | ICD-10-CM

## 2022-12-30 DIAGNOSIS — J9621 Acute and chronic respiratory failure with hypoxia: Secondary | ICD-10-CM | POA: Diagnosis present

## 2022-12-30 DIAGNOSIS — J4489 Other specified chronic obstructive pulmonary disease: Secondary | ICD-10-CM | POA: Diagnosis present

## 2022-12-30 DIAGNOSIS — J9622 Acute and chronic respiratory failure with hypercapnia: Secondary | ICD-10-CM | POA: Diagnosis present

## 2022-12-30 DIAGNOSIS — E669 Obesity, unspecified: Secondary | ICD-10-CM | POA: Diagnosis present

## 2022-12-30 DIAGNOSIS — Z6838 Body mass index (BMI) 38.0-38.9, adult: Secondary | ICD-10-CM

## 2022-12-30 DIAGNOSIS — F419 Anxiety disorder, unspecified: Secondary | ICD-10-CM | POA: Diagnosis present

## 2022-12-30 DIAGNOSIS — Z1152 Encounter for screening for COVID-19: Secondary | ICD-10-CM | POA: Diagnosis not present

## 2022-12-30 DIAGNOSIS — Z9049 Acquired absence of other specified parts of digestive tract: Secondary | ICD-10-CM

## 2022-12-30 DIAGNOSIS — G8929 Other chronic pain: Secondary | ICD-10-CM

## 2022-12-30 DIAGNOSIS — G928 Other toxic encephalopathy: Principal | ICD-10-CM | POA: Diagnosis present

## 2022-12-30 DIAGNOSIS — F32A Depression, unspecified: Secondary | ICD-10-CM | POA: Diagnosis present

## 2022-12-30 DIAGNOSIS — K589 Irritable bowel syndrome without diarrhea: Secondary | ICD-10-CM | POA: Diagnosis present

## 2022-12-30 DIAGNOSIS — Z9851 Tubal ligation status: Secondary | ICD-10-CM

## 2022-12-30 DIAGNOSIS — I959 Hypotension, unspecified: Secondary | ICD-10-CM | POA: Diagnosis not present

## 2022-12-30 DIAGNOSIS — R031 Nonspecific low blood-pressure reading: Secondary | ICD-10-CM | POA: Diagnosis present

## 2022-12-30 DIAGNOSIS — R400 Somnolence: Secondary | ICD-10-CM

## 2022-12-30 DIAGNOSIS — R569 Unspecified convulsions: Secondary | ICD-10-CM

## 2022-12-30 DIAGNOSIS — M1990 Unspecified osteoarthritis, unspecified site: Secondary | ICD-10-CM | POA: Diagnosis present

## 2022-12-30 DIAGNOSIS — Z7985 Long-term (current) use of injectable non-insulin antidiabetic drugs: Secondary | ICD-10-CM

## 2022-12-30 DIAGNOSIS — G43909 Migraine, unspecified, not intractable, without status migrainosus: Secondary | ICD-10-CM | POA: Diagnosis present

## 2022-12-30 DIAGNOSIS — Z91030 Bee allergy status: Secondary | ICD-10-CM

## 2022-12-30 LAB — COMPREHENSIVE METABOLIC PANEL
ALT: 24 U/L (ref 0–44)
AST: 30 U/L (ref 15–41)
Albumin: 3.6 g/dL (ref 3.5–5.0)
Alkaline Phosphatase: 45 U/L (ref 38–126)
Anion gap: 16 — ABNORMAL HIGH (ref 5–15)
BUN: 34 mg/dL — ABNORMAL HIGH (ref 6–20)
CO2: 30 mmol/L (ref 22–32)
Calcium: 8.8 mg/dL — ABNORMAL LOW (ref 8.9–10.3)
Chloride: 89 mmol/L — ABNORMAL LOW (ref 98–111)
Creatinine, Ser: 2.78 mg/dL — ABNORMAL HIGH (ref 0.44–1.00)
GFR, Estimated: 20 mL/min — ABNORMAL LOW (ref >60)
Glucose, Bld: 124 mg/dL — ABNORMAL HIGH (ref 70–99)
Potassium: 3.4 mmol/L — ABNORMAL LOW (ref 3.5–5.1)
Sodium: 135 mmol/L (ref 135–145)
Total Bilirubin: 0.6 mg/dL (ref 0.3–1.2)
Total Protein: 6.5 g/dL (ref 6.5–8.1)

## 2022-12-30 LAB — URINALYSIS, W/ REFLEX TO CULTURE (INFECTION SUSPECTED)
Bilirubin Urine: NEGATIVE
Glucose, UA: NEGATIVE mg/dL
Hgb urine dipstick: NEGATIVE
Ketones, ur: NEGATIVE mg/dL
Leukocytes,Ua: NEGATIVE
Nitrite: NEGATIVE
Protein, ur: NEGATIVE mg/dL
Specific Gravity, Urine: 1.01 (ref 1.005–1.030)
pH: 5 (ref 5.0–8.0)

## 2022-12-30 LAB — RESP PANEL BY RT-PCR (RSV, FLU A&B, COVID)  RVPGX2
Influenza A by PCR: NEGATIVE
Influenza B by PCR: NEGATIVE
Resp Syncytial Virus by PCR: NEGATIVE
SARS Coronavirus 2 by RT PCR: NEGATIVE

## 2022-12-30 LAB — AMMONIA: Ammonia: 12 umol/L (ref 9–35)

## 2022-12-30 LAB — TROPONIN I (HIGH SENSITIVITY): Troponin I (High Sensitivity): 6 ng/L (ref <18)

## 2022-12-30 LAB — CBC WITH DIFFERENTIAL/PLATELET
Abs Immature Granulocytes: 0.07 10*3/uL (ref 0.00–0.07)
Basophils Absolute: 0.1 10*3/uL (ref 0.0–0.1)
Basophils Relative: 1 %
Eosinophils Absolute: 0.1 10*3/uL (ref 0.0–0.5)
Eosinophils Relative: 1 %
HCT: 42.1 % (ref 36.0–46.0)
Hemoglobin: 13.9 g/dL (ref 12.0–15.0)
Immature Granulocytes: 1 %
Lymphocytes Relative: 38 %
Lymphs Abs: 4.7 10*3/uL — ABNORMAL HIGH (ref 0.7–4.0)
MCH: 32.1 pg (ref 26.0–34.0)
MCHC: 33 g/dL (ref 30.0–36.0)
MCV: 97.2 fL (ref 80.0–100.0)
Monocytes Absolute: 0.9 10*3/uL (ref 0.1–1.0)
Monocytes Relative: 7 %
Neutro Abs: 6.6 10*3/uL (ref 1.7–7.7)
Neutrophils Relative %: 52 %
Platelets: 212 10*3/uL (ref 150–400)
RBC: 4.33 MIL/uL (ref 3.87–5.11)
RDW: 14.9 % (ref 11.5–15.5)
WBC: 12.5 10*3/uL — ABNORMAL HIGH (ref 4.0–10.5)
nRBC: 0 % (ref 0.0–0.2)

## 2022-12-30 LAB — RAPID URINE DRUG SCREEN, HOSP PERFORMED
Amphetamines: NOT DETECTED
Barbiturates: NOT DETECTED
Benzodiazepines: POSITIVE — AB
Cocaine: NOT DETECTED
Opiates: NOT DETECTED
Tetrahydrocannabinol: NOT DETECTED

## 2022-12-30 LAB — I-STAT VENOUS BLOOD GAS, ED
Acid-Base Excess: 5 mmol/L — ABNORMAL HIGH (ref 0.0–2.0)
Bicarbonate: 30.6 mmol/L — ABNORMAL HIGH (ref 20.0–28.0)
Calcium, Ion: 0.98 mmol/L — ABNORMAL LOW (ref 1.15–1.40)
HCT: 42 % (ref 36.0–46.0)
Hemoglobin: 14.3 g/dL (ref 12.0–15.0)
O2 Saturation: 77 %
Potassium: 3.5 mmol/L (ref 3.5–5.1)
Sodium: 132 mmol/L — ABNORMAL LOW (ref 135–145)
TCO2: 32 mmol/L (ref 22–32)
pCO2, Ven: 46.3 mmHg (ref 44–60)
pH, Ven: 7.428 (ref 7.25–7.43)
pO2, Ven: 41 mmHg (ref 32–45)

## 2022-12-30 LAB — CK: Total CK: 202 U/L (ref 38–234)

## 2022-12-30 LAB — GLUCOSE, CAPILLARY
Glucose-Capillary: 166 mg/dL — ABNORMAL HIGH (ref 70–99)
Glucose-Capillary: 143 mg/dL — ABNORMAL HIGH (ref 70–99)

## 2022-12-30 LAB — CREATININE, URINE, RANDOM: Creatinine, Urine: 103 mg/dL

## 2022-12-30 LAB — SODIUM, URINE, RANDOM: Sodium, Ur: 45 mmol/L

## 2022-12-30 LAB — LIPASE, BLOOD: Lipase: 30 U/L (ref 11–51)

## 2022-12-30 LAB — ETHANOL: Alcohol, Ethyl (B): <10 mg/dL (ref <10)

## 2022-12-30 LAB — VALPROIC ACID LEVEL: Valproic Acid Lvl: 19 ug/mL — ABNORMAL LOW (ref 50.0–100.0)

## 2022-12-30 MED ORDER — LACTATED RINGERS IV BOLUS
500.0000 mL | Freq: Once | INTRAVENOUS | Status: AC
  Administered 2022-12-30: 500 mL via INTRAVENOUS

## 2022-12-30 MED ORDER — SODIUM CHLORIDE 0.9% FLUSH
3.0000 mL | Freq: Two times a day (BID) | INTRAVENOUS | Status: DC
  Administered 2022-12-30 – 2022-12-31 (×2): 3 mL via INTRAVENOUS

## 2022-12-30 MED ORDER — IPRATROPIUM-ALBUTEROL 0.5-2.5 (3) MG/3ML IN SOLN: 3.0000 mL | Freq: Four times a day (QID) | RESPIRATORY_TRACT | Status: DC | PRN

## 2022-12-30 MED ORDER — ARFORMOTEROL TARTRATE 15 MCG/2ML IN NEBU
15.0000 ug | INHALATION_SOLUTION | Freq: Two times a day (BID) | RESPIRATORY_TRACT | Status: DC
  Administered 2022-12-30 – 2023-01-01 (×4): 15 ug via RESPIRATORY_TRACT
  Filled 2022-12-30 (×4): qty 2

## 2022-12-30 MED ORDER — BUDESONIDE 0.5 MG/2ML IN SUSP
0.5000 mg | Freq: Two times a day (BID) | RESPIRATORY_TRACT | Status: DC
  Administered 2022-12-30 – 2023-01-01 (×4): 0.5 mg via RESPIRATORY_TRACT
  Filled 2022-12-30 (×4): qty 2

## 2022-12-30 MED ORDER — ACETAMINOPHEN 325 MG PO TABS
650.0000 mg | ORAL_TABLET | Freq: Four times a day (QID) | ORAL | Status: DC | PRN
  Administered 2022-12-30: 650 mg via ORAL
  Filled 2022-12-30: qty 2

## 2022-12-30 MED ORDER — INSULIN ASPART 100 UNIT/ML IJ SOLN
0.0000 [IU] | Freq: Three times a day (TID) | INTRAMUSCULAR | Status: DC
  Administered 2022-12-30 – 2022-12-31 (×2): 2 [IU] via SUBCUTANEOUS

## 2022-12-30 MED ORDER — SODIUM CHLORIDE 0.9 % IV SOLN: 12.5000 mg | Freq: Four times a day (QID) | INTRAVENOUS | Status: DC | PRN

## 2022-12-30 MED ORDER — INSULIN ASPART 100 UNIT/ML IJ SOLN: 0.0000 [IU] | Freq: Every day | INTRAMUSCULAR | Status: DC

## 2022-12-30 MED ORDER — CALCIUM GLUCONATE-NACL 1-0.675 GM/50ML-% IV SOLN
1.0000 g | Freq: Once | INTRAVENOUS | Status: AC
  Administered 2022-12-30: 1000 mg via INTRAVENOUS
  Filled 2022-12-30: qty 50

## 2022-12-30 MED ORDER — PANTOPRAZOLE SODIUM 40 MG PO TBEC
40.0000 mg | DELAYED_RELEASE_TABLET | Freq: Every day | ORAL | Status: DC
  Administered 2022-12-30 – 2023-01-01 (×3): 40 mg via ORAL
  Filled 2022-12-30 (×3): qty 1

## 2022-12-30 MED ORDER — ROSUVASTATIN CALCIUM 5 MG PO TABS
10.0000 mg | ORAL_TABLET | Freq: Every day | ORAL | Status: DC
  Administered 2022-12-30 – 2022-12-31 (×2): 10 mg via ORAL
  Filled 2022-12-30 (×2): qty 2

## 2022-12-30 MED ORDER — SODIUM CHLORIDE 0.9 % IV BOLUS
1000.0000 mL | Freq: Once | INTRAVENOUS | Status: AC
  Administered 2022-12-30: 1000 mL via INTRAVENOUS

## 2022-12-30 MED ORDER — PRAMIPEXOLE DIHYDROCHLORIDE 1 MG PO TABS
1.0000 mg | ORAL_TABLET | Freq: Every day | ORAL | Status: DC
  Administered 2022-12-30 – 2022-12-31 (×2): 1 mg via ORAL
  Filled 2022-12-30 (×3): qty 1

## 2022-12-30 MED ORDER — LACTATED RINGERS IV SOLN: INTRAVENOUS | Status: DC

## 2022-12-30 MED ORDER — POTASSIUM CHLORIDE CRYS ER 20 MEQ PO TBCR
20.0000 meq | EXTENDED_RELEASE_TABLET | ORAL | Status: AC
  Administered 2022-12-30: 20 meq via ORAL
  Filled 2022-12-30: qty 1

## 2022-12-30 MED ORDER — MONTELUKAST SODIUM 10 MG PO TABS
10.0000 mg | ORAL_TABLET | Freq: Every day | ORAL | Status: DC
  Administered 2022-12-30 – 2022-12-31 (×2): 10 mg via ORAL
  Filled 2022-12-30 (×2): qty 1

## 2022-12-30 MED ORDER — ACETAMINOPHEN 650 MG RE SUPP: 650.0000 mg | Freq: Four times a day (QID) | RECTAL | Status: DC | PRN

## 2022-12-30 MED ORDER — PROMETHAZINE HCL 25 MG RE SUPP: 25.0000 mg | Freq: Four times a day (QID) | RECTAL | Status: DC | PRN

## 2022-12-30 MED ORDER — HEPARIN SODIUM (PORCINE) 5000 UNIT/ML IJ SOLN
5000.0000 [IU] | Freq: Three times a day (TID) | INTRAMUSCULAR | Status: DC
  Administered 2022-12-30 – 2023-01-01 (×6): 5000 [IU] via SUBCUTANEOUS
  Filled 2022-12-30 (×6): qty 1

## 2022-12-30 MED ORDER — PRAMIPEXOLE DIHYDROCHLORIDE 0.25 MG PO TABS: 0.5000 mg | ORAL_TABLET | ORAL | Status: DC

## 2022-12-30 MED ORDER — OXYCODONE-ACETAMINOPHEN 7.5-325 MG PO TABS
1.0000 | ORAL_TABLET | Freq: Three times a day (TID) | ORAL | Status: DC | PRN
  Administered 2022-12-30 – 2023-01-01 (×4): 1 via ORAL
  Filled 2022-12-30 (×4): qty 1

## 2022-12-30 MED ORDER — TRAZODONE HCL 50 MG PO TABS
50.0000 mg | ORAL_TABLET | Freq: Every day | ORAL | Status: DC
  Administered 2022-12-30 – 2022-12-31 (×2): 50 mg via ORAL
  Filled 2022-12-30 (×2): qty 1

## 2022-12-30 MED ORDER — PRAMIPEXOLE DIHYDROCHLORIDE 0.25 MG PO TABS
0.5000 mg | ORAL_TABLET | Freq: Every day | ORAL | Status: DC
  Administered 2022-12-31 – 2023-01-01 (×2): 0.5 mg via ORAL
  Filled 2022-12-30 (×2): qty 2

## 2022-12-30 NOTE — Progress Notes (Signed)
Patient refused bipap, says she can't sleep with it on.
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
## 2022-12-30 NOTE — H&P (Signed)
History and Physical    Patient: Beverly Tran [id number] DOB: 1971/09/03 DOA: 12/30/2022 DOS: the patient was seen and examined on 12/30/2022 PCP: Olga Coaster, FNP  Patient coming from: Transfer from [facility]  Chief Complaint:  Chief Complaint  Patient presents with   Altered Mental Status   HPI: Beverly Tran is a 52 y.o. female with medical history significant of hypertension, hyperlipidemia, diabetes mellitus type 2, meningioma, COPD, chronic migraines, RLS, with oxygen as needed, anxiety, depression, tobacco use, and GERD who presented yesterday to the emergency department at [facility] after being noted to be acutely altered where her boyfriend could not wake her.  She had just recently been hospitalized under neurology service from 3/11-3/13 for seizure-like activity.  During the hospital stay she underwent continuous EEG monitoring without note of seizure activity appreciated.patient makes note that during the stay Depakote have been held and resumed at discharge.  She reports that after getting home and restarting Depakote as when she started having the recurrent episodes of jerking and was more lethargic.  Denies taking more pain medication than she supposed to or any thoughts of wanting to harm herself.  She has been on Depakote for the last 6 months or so.  She chronically have sinus congestion and cough that is unchanged from baseline.  Denies having any fever, chills, nausea, vomiting, or diarrhea symptoms.  She does still report smoking 4-5 cigarettes per day on average cigarettes/day. Labs significant for WBC 13.7, hemoglobin 15.2 sodium 134, CO2 32.2, BUN 37, and creatinine 3.14.  Chest x-ray and CT scan of the head did not note any acute abnormalities.  Arterial blood gas there revealed pH 7.42, pCO2 50.9, pO2 54.  UDS was positive for oxycodone.  Patient has been given 1 L of normal saline IV fluids at the outside facility.  Transfer has been requested due  to need of neurology consultative services.  In the emergency department patient was noted to be afebrile with blood pressures 93/51-118/58, and O2 saturation currently maintained on BiPAP at 30% FiO2.  Labs significant for WBC 12.5, potassium 3.4, chloride 89, BUN 34, creatinine 2.78, calcium 8.8, ionized calcium 0.98, and high-sensitivity troponin negative.  Venous blood gas noted pH 7.428, pCO2 46.3, and pO2 41..  Influenza, COVID-19, and RSV screening were negative.  Chest x-ray noted no acute abnormality.  Patient has been given 1.5 L of IV fluids and DuoNeb breathing treatments  Review of Systems: As mentioned in the history of present illness. All other systems reviewed and are negative. Past Medical History:  Diagnosis Date   Anxiety    Arthritis    Asthma    Back pain, chronic    Depression    Diabetes mellitus without complication (HCC)    GERD (gastroesophageal reflux disease)    High cholesterol    Hypertension    IBS (irritable bowel syndrome)    Migraine    Past Surgical History:  Procedure Laterality Date   APPENDECTOMY     CESAREAN SECTION     CHOLECYSTECTOMY     COLONOSCOPY  2003   poor prep, grossly normal rectum, colon, and TI. Path not available.    ESOPHAGOGASTRODUODENOSCOPY  2003   Dr. Gala Romney:  normal esophagus, stomach, duodenum s/p small bowel biopsy.    HERNIA REPAIR     TEE WITHOUT CARDIOVERSION N/A 03/03/2022   Procedure: TRANSESOPHAGEAL ECHOCARDIOGRAM (TEE);  Surgeon: Lelon Perla, MD;  Location: [facility] ENDOSCOPY;  Service: Cardiovascular;  Laterality: N/A;   TUBAL  LIGATION     Social History:  reports that she quit smoking about 2 years ago. Her smoking use included cigarettes. She has a 7.00 pack-year smoking history. She has never used smokeless tobacco. She reports that she does not drink alcohol and does not use drugs.  Allergies  Allergen Reactions   Bee Venom Anaphylaxis and Swelling    WASP/HORNET   Diclofenac Anaphylaxis   Salami [Pickled  Meat] Shortness Of Breath and Swelling    Also Allergic to POPCORN: same reaction   Aspirin Other (See Comments)    Nose bleeds   Compazine [Prochlorperazine] Hives   Imitrex [Sumatriptan] Hives   Lamotrigine Other (See Comments)    confusion   Lisinopril      Unknown   Nubain [Nalbuphine Hcl] Swelling and Other (See Comments)    Eye swelling   Thorazine [Chlorpromazine] Hives   Topamax [Topiramate] Other (See Comments)    Confusion and out of it   Voltaren [Diclofenac Sodium] Swelling   Zofran [Ondansetron Hcl] Nausea Only    Family History  Problem Relation Age of Onset   Seizures Mother    Heart failure Mother    Heart failure Father    Colon cancer Neg Hx    Colon polyps Neg Hx     Prior to Admission medications   Medication Sig Start Date End Date Taking? Authorizing Provider  clonazePAM (KLONOPIN) 0.5 MG tablet Take by mouth. 10/20/22  Yes [provider]  gabapentin (NEURONTIN) 600 MG tablet Take by mouth. 10/25/22  Yes [provider]  promethazine (PHENERGAN) 12.5 MG tablet Take by mouth. 12/07/22  Yes [provider]  SYMBICORT 160-4.5 MCG/ACT inhaler Inhale into the lungs. 12/29/22  Yes [provider]  albuterol (VENTOLIN HFA) 108 (90 Base) MCG/ACT inhaler Inhale 2 puffs into the lungs as needed for wheezing or shortness of breath.    [provider]  azithromycin (ZITHROMAX) 250 MG tablet Take as directed Patient not taking: Reported on 12/26/2022 12/20/22   Tanda Rockers, MD  baclofen (LIORESAL) 10 MG tablet Take 10 mg by mouth at bedtime. 12/17/22   [provider]  clonazePAM (KLONOPIN) 0.5 MG tablet Take 1 tablet (0.5 mg total) by mouth 2 (two) times daily. 10/28/22   Cameron Sprang, MD  cyclobenzaprine (FLEXERIL) 10 MG tablet Take 1 tablet by mouth every 8 (eight) hours as needed. Patient not taking: Reported on 12/26/2022    [provider]  divalproex (DEPAKOTE ER) 250 MG 24 hr tablet Take 2 tablets in  AM, 3 tablets in PM Patient taking differently: Take 500-750 mg by mouth in the morning and at bedtime. Take 500 mg by mouth in the morning and 750 mg by mouth at night 11/08/22   Cameron Sprang, MD  Dulaglutide (TRULICITY) 3 0000000 SOPN Inject 3 mg into the skin once a week.    [provider]  EPINEPHrine 0.3 mg/0.3 mL IJ SOAJ injection Inject 0.3 mg into the muscle as needed for anaphylaxis. 05/11/21   [provider]  famotidine (PEPCID) 20 MG tablet Take 1 tablet (20 mg total) by mouth daily. 07/27/22   Tanda Rockers, MD  furosemide (LASIX) 40 MG tablet Take 1 tablet (40 mg total) by mouth daily as needed for fluid or edema (as needed for leg swelling or worsening shortnes of breath.). Patient taking differently: Take 40 mg by mouth daily. 03/04/22   Arrien, Jimmy Picket, MD  gabapentin (NEURONTIN) 600 MG tablet Take 1 tablet (600 mg  total) by mouth 3 (three) times daily. 10/28/22   Cameron Sprang, MD  ipratropium-albuterol (DUONEB) 0.5-2.5 (3) MG/3ML SOLN Take 3 mLs by nebulization as needed (shortness of breath or wheezing).    [provider]  lidocaine (XYLOCAINE) 5 % ointment Apply topically daily as needed. Patient not taking: Reported on 12/26/2022 07/13/22   Tanda Rockers, MD  metFORMIN (GLUCOPHAGE) 500 MG tablet Take 500 mg by mouth 2 (two) times daily with a meal.    [provider]  metFORMIN (GLUCOPHAGE) 500 MG tablet Take by mouth.    [provider]  montelukast (SINGULAIR) 10 MG tablet Take 10 mg by mouth daily.    [provider]  naloxone [facility]) nasal spray 4 mg/0.1 mL Place 0.4 mg into the nose once. 08/31/21   [provider]  olmesartan-hydrochlorothiazide (BENICAR HCT) 20-12.5 MG tablet Take 1 tablet by mouth daily.    [provider]  oxyCODONE (OXY IR/ROXICODONE) 5 MG immediate release tablet Take 5 mg by mouth every 6 (six) hours as needed. Patient not taking: Reported on 12/26/2022     [provider]  oxyCODONE-acetaminophen (PERCOCET) 7.5-325 MG tablet Take 1 tablet by mouth every 6 (six) hours as needed for moderate pain. 12/21/22   [provider]  pantoprazole (PROTONIX) 40 MG tablet TAKE ONE TABLET BY MOUTH DAILY (MORNING) Patient taking differently: Take 40 mg by mouth daily. 10/13/21   Tanda Rockers, MD  pioglitazone (ACTOS) 45 MG tablet Take 45 mg by mouth daily. Patient not taking: Reported on 12/26/2022    [provider]  pramipexole (MIRAPEX) 1 MG tablet Take 1/2 tablet in AM, 1 tablet in PM Patient taking differently: Take 0.5-1 mg by mouth in the morning and at bedtime. Take 0.5 mg in the morning and 1 mg at bedtime 10/28/22   Cameron Sprang, MD  pramipexole (MIRAPEX) 1.5 MG tablet Take by mouth.    [provider]  promethazine (PHENERGAN) 25 MG tablet TAKE ONE TABLET BY MOUTH EVERY 8 HOURS AS NEEDED FOR NAUSEA AND VOMITING Patient taking differently: Take 25 mg by mouth every 6 (six) hours as needed for nausea or vomiting. 09/21/22   Annitta Needs, NP  rosuvastatin (CRESTOR) 10 MG tablet Take 10 mg by mouth at bedtime. 04/04/22   [provider]  SYMBICORT 80-4.5 MCG/ACT inhaler Take 2 puffs first thing in am and then another 2 puffs about 12 hours later. 12/29/22   Tanda Rockers, MD  traZODone (DESYREL) 50 MG tablet Take 50 mg by mouth at bedtime. 12/08/22   [provider]    Physical Exam: Vitals:   12/30/22 0725 12/30/22 0745 12/30/22 0800 12/30/22 0810  BP: (!) 99/50 (!) 102/52 (!) 95/56 (!) 118/58  Pulse: 74 72 75 79  Resp: 16 16 15 15   Temp:      TempSrc:      SpO2: 98% 98% 99% 99%   Exam  Constitutional: Middle-aged female appears to be in no acute distress at this time Eyes: PERRL, lids and conjunctivae normal ENMT: Mucous membranes are moist.  Fair dentition Neck: normal, supple, no masses, no thyromegaly Respiratory: clear to auscultation bilaterally, no wheezing, no crackles. Normal  respiratory effort.  O2 sat patient currently maintained on 4 L nasal cannula oxygen. Cardiovascular: Regular rate and rhythm, no murmurs / rubs / gallops. No extremity edema. 2+ pedal pulses. No carotid bruits.  Abdomen: no tenderness, no masses palpated. No hepatosplenomegaly. Bowel sounds positive.  Musculoskeletal: no clubbing /  cyanosis. No joint deformity upper and lower extremities. Good ROM, no contractures. Normal muscle tone.  Skin: no rashes, lesions, ulcers. No induration Neurologic: CN 2-12 grossly intact. Sensation intact, DTR normal. Strength 5/5 in all 4.  Psychiatric: Normal judgment and insight. Alert and oriented x 3. Normal mood.   Data Reviewed:  EKG reveals sinus rhythm at 90 bpm with QTc 844.  Reviewed labs, imaging, and pertinent records as noted above in the HPI Assessment and Plan:  Acute metabolic encephalopathy   Patient was noted to be acutely altered her boyfriend.  She had just been hospitalized for staring spells with continuous EEG monitoring that did not reveal any signs of any seizure-like activity.  Reported to have been off Depakote at that time, but was resumed at discharge which she feels she became more lethargic.  UDS at the outside facility positive for oxycodone, but patient was on multiple other possible sedating medications which could have led to her acute altered state with acute kidney injury. -Admit to a telemetry bed -Seizure precautions -Neurochecks -Check TSH -Attempt to hold sedate medications at this time  Acute on chronic respiratory failure with hypoxia and hypercapnia ABG at the outside facility was significant for pH 7.42, pCO2 50.9, pO2 54.  Patient had been placed on BiPAP until she became more alert and oriented and has since been weaned to 4 L nasal cannula oxygen.  Patient is followed by Dr. [name] in outpatient setting and reports being on oxygen as needed -Continuous pulse oximetry with oxygen maintain O2 saturation greater than  90% -Weaned to room air as tolerated  Acute kidney injury Patient had presented to the outside facility with creatinine initially noted to be elevated up to 3.14 with BUN 37.  Patient has been given 1 L normal saline IV fluids at the facility.  Repeat check here in the emergency department noted creatinine to be 2.78 with BUN 34.  Urinalysis did not note significant signs of infection.  Her baseline creatinine previously have been 0.86 on 3/11.  Suspect likely multifactorial as patient had also been on medications like furosemide and per recent recent fill dates olmesartan-hydrochlorothiazide which had just been restarted 3/6. -Strict I&O's -Hold nephrotoxic agents -Check CK -Check urine creatinine, urine urea, urine sodium -Continue to monitor kidney function daily  Seizure disorder Patient has a history of seizure disorder for which she is followed in the outpatient setting by Dr. Delice Lesch of neurology.  Just recently hospitalized with no seizure-like activity appreciated despite being off medications from 3/11 to 3/13.  Home medication regimen includes Depakote 500 mg every morning/750 mg nightly, gabapentin 600 mg 3 times daily, and Klonopin 0.5 mg twice daily. -Check Depakote level -Neurology consulted in regards to current medication regimen to see if any changes need to be made.  Transient hypotension Acute.  Initial blood pressures noted to be as low as 95/44.  Blood pressures improved with IV fluids.  Home blood pressure regimen included olmesartan hydrochlorothiazide and furosemide 40 mg daily as needed. -Hold home blood pressure regimen -Continue IV fluids to maintain maps greater than 65  Hypokalemia Hypocalcemia Acute.  Initial potassium 3.4, calcium 8.8, and ionized calcium 0.98. -Give potassium chloride 20 mEq p.o. -Given calcium gluconate 1 g IV -Continue to monitor and replace as needed  Diabetes mellitus type 2, On admission glucose was noted to be 124.  Last available  hemoglobin A1c was 7 back in 02/16/2022.  Home medication regimen includes Trulicity 3 mg weekly, metformin 500 mg twice daily,  and Actos 45 mg daily -Check hemoglobin A1c in a.m. -Hold metformin and Actos due to AKI -CBGs before every meal with sensitive SSI -Adjust insulin regimen as needed  Chronic obstructive pulmonary disease Patient has normal respiratory effort on physical exam without significant wheezes or rhonchi appreciated at this time.  Chest x-ray noted no acute abnormality.  She is following up patient setting by Dr. [name] -Continue Singulair and DuoNebs as needed for shortness of breath/wheezing -Pharmacy substitution of Brovana and budesonide for Symbicort inhaler  Chronic back pain Home medication regimen includes baclofen 10 mg nightly, Flexeril 10 mg as needed for muscle spasms, and oxycodone 7.5 mg every 6 hours as needed for moderate pain. -Will resume oxycodone as needed for pain but waited every 8 hours instead of every 6 hours  RLS -Continue Mirapex  Insomnia -Continue trazodone  Hyperlipidemia -Continue Crestor  GERD -Continue Protonix  Tobacco abuse Patient still reports smoking 4 to 6 cigarettes/day on average.  She did not tell rate and nicotine patch previously in the past. -Continue to counsel on need of cessation of tobacco use  Obesity BMI 38.27 kg/m  DVT prophylaxis: Heparin Advance Care Planning:   Code Status: Full Code   Consults: Neurology   Severity of Illness: The appropriate patient status for this patient is INPATIENT. Inpatient status is judged to be reasonable and necessary in order to provide the required intensity of service to ensure the patient's safety. The patient's presenting symptoms, physical exam findings, and initial radiographic and laboratory data in the context of their chronic comorbidities is felt to place them at high risk for further clinical deterioration. Furthermore, it is not anticipated that the patient will be  medically stable for discharge from the hospital within 2 midnights of admission.   * I certify that at the point of admission it is my clinical judgment that the patient will require inpatient hospital care spanning beyond 2 midnights from the point of admission due to high intensity of service, high risk for further deterioration and high frequency of surveillance required.*  Author: Norval Morton, MD 12/30/2022 8:35 AM  For on call review [email or web address].

## 2022-12-30 NOTE — ED Triage Notes (Signed)
Pt was BIB by [facility] from [facility] probably d/t polypharmacy.  Pt was recently put on a high dose of Depakote 750 mg and has other pain meds on board.  Pt is on 3L.  ABG showed PO2 was 54% O2 was 86% and Pco2 50

## 2022-12-30 NOTE — ED Notes (Signed)
ED TO INPATIENT HANDOFF REPORT  ED Nurse Name and Phone #: [phone]  S Name/Age/Gender Deitra Mayo 52 y.o. female Room/Bed: TRABC/TRABC  Code Status   Code Status: Prior  Home/SNF/Other Home Patient oriented to: self, place, time, and situation Is this baseline? Yes   Triage Complete: Triage complete  Chief Complaint Acute metabolic encephalopathy [id number]  Triage Note Pt was BIB by [facility] from [place] probably d/t polypharmacy.  Pt was recently put on a high dose of Depakote 750 mg and has other pain meds on board.  Pt is on 3L.  ABG showed PO2 was 54% O2 was 86% and Pco2 50   Allergies Allergies  Allergen Reactions   Bee Venom Anaphylaxis and Swelling    WASP/HORNET   Diclofenac Anaphylaxis   Salami [Pickled Meat] Shortness Of Breath and Swelling    Also Allergic to POPCORN: same reaction   Aspirin Other (See Comments)    Nose bleeds   Compazine [Prochlorperazine] Hives   Imitrex [Sumatriptan] Hives   Lamotrigine Other (See Comments)    confusion   Lisinopril      Unknown   Nubain [Nalbuphine Hcl] Swelling and Other (See Comments)    Eye swelling   Thorazine [Chlorpromazine] Hives   Topamax [Topiramate] Other (See Comments)    Confusion and out of it   Voltaren [Diclofenac Sodium] Swelling   Zofran [Ondansetron Hcl] Nausea Only    Level of Care/Admitting Diagnosis ED Disposition     ED Disposition  Admit   Condition  --   [place]: [place] [[id number]]  Level of Care: Progressive [102]  Admit to Progressive based on following criteria: RESPIRATORY PROBLEMS hypoxemic/hypercapnic respiratory failure that is responsive to NIPPV (BiPAP) or High Flow Nasal Cannula (6-80 lpm). Frequent assessment/intervention, no > Q2 hrs < Q4 hrs, to maintain oxygenation and pulmonary hygiene.  May admit patient to [facility] or [facility] if equivalent level of care is available:: No  Covid Evaluation: Asymptomatic - no  recent exposure (last 10 days) testing not required  Diagnosis: Acute metabolic encephalopathy [id number]  Admitting Physician: Norval Morton [id number]  Attending Physician: Norval Morton [id number]  Certification:: I certify this patient will need inpatient services for at least 2 midnights  Estimated Length of Stay: 2          B Medical/Surgery History Past Medical History:  Diagnosis Date   Anxiety    Arthritis    Asthma    Back pain, chronic    Depression    Diabetes mellitus without complication ([place])    GERD (gastroesophageal reflux disease)    High cholesterol    Hypertension    IBS (irritable bowel syndrome)    Migraine    Past Surgical History:  Procedure Laterality Date   APPENDECTOMY     CESAREAN SECTION     CHOLECYSTECTOMY     COLONOSCOPY  2003   poor prep, grossly normal rectum, colon, and TI. Path not available.    ESOPHAGOGASTRODUODENOSCOPY  2003   Dr. Gala Romney:  normal esophagus, stomach, duodenum s/p small bowel biopsy.    HERNIA REPAIR     TEE WITHOUT CARDIOVERSION N/A 03/03/2022   Procedure: TRANSESOPHAGEAL ECHOCARDIOGRAM (TEE);  Surgeon: Lelon Perla, MD;  Location[facility] ENDOSCOPY;  Service: Cardiovascular;  Laterality: N/A;   TUBAL LIGATION       A IV Location/Drains/Wounds Patient Lines/Drains/Airways Status     Active Line/Drains/Airways     Name Placement date Placement time  Site Days   Peripheral IV 12/30/22 20 G 1" Left Antecubital 12/30/22  0226  Antecubital  less than 1   Peripheral IV 12/30/22 20 G 1" Anterior;Right Hand 12/30/22  0618  Hand  less than 1            Intake/Output Last 24 hours  Intake/Output Summary (Last 24 hours) at 12/30/2022 [id number] Last data filed at 12/30/2022 [id number] Gross per 24 hour  Intake 547.31 ml  Output --  Net 547.31 ml    Labs/Imaging Results for orders placed or performed during the hospital encounter of 12/30/22 (from the past 48 hour(s))  Resp panel by RT-PCR (RSV, Flu A&B, Covid)  Anterior Nasal Swab     Status: None   Collection Time: 12/30/22  3:59 AM   Specimen: Anterior Nasal Swab  Result Value Ref Range   SARS Coronavirus 2 by RT PCR NEGATIVE NEGATIVE   Influenza A by PCR NEGATIVE NEGATIVE   Influenza B by PCR NEGATIVE NEGATIVE    Comment: (NOTE) The Xpert Xpress SARS-CoV-2/FLU/RSV plus assay is intended as an aid in the diagnosis of influenza from Nasopharyngeal swab specimens and should not be used as a sole basis for treatment. Nasal washings and aspirates are unacceptable for Xpert Xpress SARS-CoV-2/FLU/RSV testing.  Fact Sheet for Patients: EntrepreneurPulse.com.au  Fact Sheet for Healthcare Providers: IncredibleEmployment.be  This test is not yet approved or cleared by the [place] FDA and has been authorized for detection and/or diagnosis of SARS-CoV-2 by FDA under an Emergency Use Authorization (EUA). This EUA will remain in effect (meaning this test can be used) for the duration of the COVID-19 declaration under Section 564(b)(1) of the Act, 21 U.S.C. section 360bbb-3(b)(1), unless the authorization is terminated or revoked.     Resp Syncytial Virus by PCR NEGATIVE NEGATIVE    Comment: (NOTE) Fact Sheet for Patients: EntrepreneurPulse.com.au  Fact Sheet for Healthcare Providers: IncredibleEmployment.be  This test is not yet approved or cleared by the [place] FDA and has been authorized for detection and/or diagnosis of SARS-CoV-2 by FDA under an Emergency Use Authorization (EUA). This EUA will remain in effect (meaning this test can be used) for the duration of the COVID-19 declaration under Section 564(b)(1) of the Act, 21 U.S.C. section 360bbb-3(b)(1), unless the authorization is terminated or revoked.  Performed at [facility], [place] [address], [place], [place] [address]   Comprehensive metabolic panel     Status: Abnormal   Collection Time:  12/30/22  4:00 AM  Result Value Ref Range   Sodium 135 135 - 145 mmol/L   Potassium 3.4 (L) 3.5 - 5.1 mmol/L   Chloride 89 (L) 98 - 111 mmol/L   CO2 30 22 - 32 mmol/L   Glucose, Bld 124 (H) 70 - 99 mg/dL    Comment: Glucose reference range applies only to samples taken after fasting for at least 8 hours.   BUN 34 (H) 6 - 20 mg/dL   Creatinine, Ser 2.78 (H) 0.44 - 1.00 mg/dL   Calcium 8.8 (L) 8.9 - 10.3 mg/dL   Total Protein 6.5 6.5 - 8.1 g/dL   Albumin 3.6 3.5 - 5.0 g/dL   AST 30 15 - 41 U/L   ALT 24 0 - 44 U/L   Alkaline Phosphatase 45 38 - 126 U/L   Total Bilirubin 0.6 0.3 - 1.2 mg/dL   GFR, Estimated 20 (L) >60 mL/min    Comment: (NOTE) Calculated using the CKD-EPI Creatinine Equation (2021)    Anion gap  16 (H) 5 - 15    Comment: Performed at [facility], [place] [address], [place], [place] [address]  CBC with Differential     Status: Abnormal   Collection Time: 12/30/22  4:00 AM  Result Value Ref Range   WBC 12.5 (H) 4.0 - 10.5 K/uL   RBC 4.33 3.87 - 5.11 MIL/uL   Hemoglobin 13.9 12.0 - 15.0 g/dL   HCT 42.1 36.0 - 46.0 %   MCV 97.2 80.0 - 100.0 fL   MCH 32.1 26.0 - 34.0 pg   MCHC 33.0 30.0 - 36.0 g/dL   RDW 14.9 11.5 - 15.5 %   Platelets 212 150 - 400 K/uL   nRBC 0.0 0.0 - 0.2 %   Neutrophils Relative % 52 %   Neutro Abs 6.6 1.7 - 7.7 K/uL   Lymphocytes Relative 38 %   Lymphs Abs 4.7 (H) 0.7 - 4.0 K/uL   Monocytes Relative 7 %   Monocytes Absolute 0.9 0.1 - 1.0 K/uL   Eosinophils Relative 1 %   Eosinophils Absolute 0.1 0.0 - 0.5 K/uL   Basophils Relative 1 %   Basophils Absolute 0.1 0.0 - 0.1 K/uL   Immature Granulocytes 1 %   Abs Immature Granulocytes 0.07 0.00 - 0.07 K/uL    Comment: Performed at [place] [address] [place], [place] [address]  Ethanol     Status: None   Collection Time: 12/30/22  4:00 AM  Result Value Ref Range   Alcohol, Ethyl (B) <10 <10 mg/dL    Comment: (NOTE) Lowest detectable limit for serum alcohol is 10  mg/dL.  For medical purposes only. Performed at [facility], [place] [address], [place], [place] [address]   Ammonia     Status: None   Collection Time: 12/30/22  4:00 AM  Result Value Ref Range   Ammonia 12 9 - 35 umol/L    Comment: Performed at [facility], [place] [address] [place], [place] [address]  Troponin I (High Sensitivity)     Status: None   Collection Time: 12/30/22  4:00 AM  Result Value Ref Range   Troponin I (High Sensitivity) 6 <18 ng/L    Comment: (NOTE) Elevated high sensitivity troponin I (hsTnI) values and significant  changes across serial measurements may suggest ACS but many other  chronic and acute conditions are known to elevate hsTnI results.  Refer to the "Links" section for chest pain algorithms and additional  guidance. Performed at [facility], [place] [address] [place], [place] [address]   Lipase, blood     Status: None   Collection Time: 12/30/22  4:00 AM  Result Value Ref Range   Lipase 30 11 - 51 U/L    Comment: Performed at [facility], [place] [address] [place],  [address]  I-Stat venous blood gas, [facility], [facility], [facility])     Status: Abnormal   Collection Time: 12/30/22  4:05 AM  Result Value Ref Range   pH, Ven 7.428 7.25 - 7.43   pCO2, Ven 46.3 44 - 60 mmHg   pO2, Ven 41 32 - 45 mmHg   Bicarbonate 30.6 (H) 20.0 - 28.0 mmol/L   TCO2 32 22 - 32 mmol/L   O2 Saturation 77 %   Acid-Base Excess 5.0 (H) 0.0 - 2.0 mmol/L   Sodium 132 (L) 135 - 145 mmol/L   Potassium 3.5 3.5 - 5.1 mmol/L   Calcium, Ion 0.98 (L) 1.15 -  1.40 mmol/L   HCT 42.0 36.0 - 46.0 %   Hemoglobin 14.3 12.0 - 15.0 g/dL   Sample type VENOUS    DG Chest Portable 1 View  Result Date: 12/30/2022 CLINICAL DATA:  Shortness of breath EXAM: PORTABLE CHEST 1 VIEW COMPARISON:  06/28/2022 FINDINGS: Low volume chest with accentuated markings at the bases, there is chronic scarring at the lung bases based on remote imaging. No edema, effusion, or air bronchogram.  Normal heart size and mediastinal contours. Artifact from EKG leads. IMPRESSION: No acute finding when compared to priors. Electronically Signed   By: Jorje Guild M.D.   On: 12/30/2022 04:33    Pending Labs Unresulted Labs (From admission, onward)     Start     Ordered   12/30/22 0358  Urine rapid drug screen (hosp performed)  Once,   STAT        12/30/22 0359   12/30/22 0358  Urinalysis, w/ Reflex to Culture (Infection Suspected) -Urine, Clean Catch  Once,   URGENT       Question:  Specimen Source  Answer:  Urine, Clean Catch   12/30/22 0359            Vitals/Pain Today's Vitals   12/30/22 0800 12/30/22 0810 12/30/22 0815 12/30/22 0845  BP: (!) 95/56 (!) 118/58  (!) 93/56  Pulse: 75 79  73  Resp: 15 15  15   Temp:      TempSrc:      SpO2: 99% 99%  100%  PainSc:   Asleep     Isolation Precautions Airborne and Contact precautions  Medications Medications  lactated ringers infusion ( Intravenous New Bag/Given 12/30/22 0824)  lactated ringers bolus 500 mL (0 mLs Intravenous Stopped 12/30/22 0523)  sodium chloride 0.9 % bolus 1,000 mL (0 mLs Intravenous Stopped 12/30/22 [id number])    Mobility walks with person assist     Focused Assessments    R Recommendations: See Admitting Provider Note  Report given to:   Additional Notes:

## 2022-12-30 NOTE — ED Provider Notes (Signed)
Emergency Department Provider Note   I have reviewed the triage vital signs and the nursing notes.   HISTORY  Chief Complaint Altered Mental Status   HPI Beverly Tran is a 52 y.o. female with PMH of COPD and encephalopathy along with chronic lower back pain on multiple pain medications at home presents to the ED as a transfer from [facility] with AKI and somnolence.  Patient was admitted to [facility] for continuous EEG monitoring and discharged on 3/13.  Pattern at that time consistent with encephalopathy.  No seizure activity appreciated.  She apparently returned home but then after taking her home medications yesterday evening her boyfriend was not able to wake her easily and called 911.  She presented to the outside emergency department where a workup at that time showed acute kidney injury and soft blood pressures.  Additional workup included CT head which was unremarkable and chest x-ray.  They considered direct admission to [facility] but ultimately patient directed to the emergency department for further evaluation prior to placement in the appropriate unit.  [facility] did not give additional bolus of IV fluid and route with no severe hypotension.  She did receive 1 L IV fluid at the outside ED. they report she is drowsy but awakens to light touch and will have a full, coherent conversation but then quickly drifts back off to sleep with sonorous respirations.  The patient denies any chest pain or shortness of breath.  She is telling me that she has pain in her lower back.  She did take her home medicines for pain but states she is been taking them as directed.  No fevers or chills.  No severe headaches.  She tells me she has been eating and drinking well.    Past Medical History:  Diagnosis Date   Anxiety    Arthritis    Asthma    Back pain, chronic    Depression    Diabetes mellitus without complication (H[facility])    GERD (gastroesophageal reflux disease)    High  cholesterol    Hypertension    IBS (irritable bowel syndrome)    Migraine     Review of Systems  Constitutional: No fever/chills Eyes: No visual changes. ENT: No sore throat. Cardiovascular: Denies chest pain. Respiratory: Denies shortness of breath. Gastrointestinal: No abdominal pain.  No nausea, no vomiting.  No diarrhea.  No constipation. Genitourinary: Negative for dysuria. Musculoskeletal: Positive for back pain. Skin: Negative for rash. Neurological: Negative for headaches, focal weakness or numbness.   ____________________________________________   PHYSICAL EXAM:  VITAL SIGNS: Vitals:   12/30/22 0445 12/30/22 0522  BP: 103/61 (!) 103/59  Pulse: 87 87  Resp: 19 13  Temp:    SpO2: 100% 100%    Constitutional: Patient is somnolent when I walk in the room but awakens to light touch on the shoulder and calling her name.  She is able to fully participate with the history and exam at that point but will drift back off to sleep if not actively speaking with her.  Eyes: Conjunctivae are normal. PERRL.  Head: Atraumatic. Nose: No congestion/rhinnorhea. Mouth/Throat: Mucous membranes are moist.  Neck: No stridor.  Cardiovascular: Normal rate, regular rhythm. Good peripheral circulation. Grossly normal heart sounds.   Respiratory: Normal respiratory effort.  No retractions. Lungs CTAB. Gastrointestinal: Soft and nontender. No distention.  Musculoskeletal: No lower extremity tenderness nor edema. No gross deformities of extremities. Neurologic:  Normal speech and language. No gross focal neurologic deficits are appreciated.  Skin:  Skin is warm, dry and intact. No rash noted.  ____________________________________________   LABS (all labs ordered are listed, but only abnormal results are displayed)  Labs Reviewed  COMPREHENSIVE METABOLIC PANEL - Abnormal; Notable for the following components:      Result Value   Potassium 3.4 (*)    Chloride 89 (*)    Glucose, Bld  124 (*)    BUN 34 (*)    Creatinine, Ser 2.78 (*)    Calcium 8.8 (*)    GFR, Estimated 20 (*)    Anion gap 16 (*)    All other components within normal limits  CBC WITH DIFFERENTIAL/PLATELET - Abnormal; Notable for the following components:   WBC 12.5 (*)    Lymphs Abs 4.7 (*)    All other components within normal limits  I-STAT VENOUS BLOOD GAS, ED - Abnormal; Notable for the following components:   Bicarbonate 30.6 (*)    Acid-Base Excess 5.0 (*)    Sodium 132 (*)    Calcium, Ion 0.98 (*)    All other components within normal limits  RESP PANEL BY RT-PCR (RSV, FLU A&B, COVID)  [id number]  ETHANOL  AMMONIA  LIPASE, BLOOD  RAPID URINE DRUG SCREEN, HOSP PERFORMED  URINALYSIS, W/ REFLEX TO CULTURE (INFECTION SUSPECTED)  TROPONIN I (HIGH SENSITIVITY)   ____________________________________________  EKG   EKG Interpretation  Date/Time:  Friday December 30 2022 03:53:45 EDT Ventricular Rate:  90 PR Interval:  160 QRS Duration: 106 QT Interval:  395 QTC Calculation: 484 R Axis:   74 Text Interpretation: Sinus rhythm Confirmed by Nanda Quinton [phone]) on 12/30/2022 4:06:04 AM        ____________________________________________  RADIOLOGY  DG Chest Portable 1 View  Result Date: 12/30/2022 CLINICAL DATA:  Shortness of breath EXAM: PORTABLE CHEST 1 VIEW COMPARISON:  06/28/2022 FINDINGS: Low volume chest with accentuated markings at the bases, there is chronic scarring at the lung bases based on remote imaging. No edema, effusion, or air bronchogram. Normal heart size and mediastinal contours. Artifact from EKG leads. IMPRESSION: No acute finding when compared to priors. Electronically Signed   By: Jorje Guild M.D.   On: 12/30/2022 04:33    ____________________________________________   PROCEDURES  Procedure(s) performed:   Procedures  CRITICAL CARE Performed by: Margette Fast Total critical care time: 45 minutes Critical care time was exclusive of separately billable  procedures and treating other patients. Critical care was necessary to treat or prevent imminent or life-threatening deterioration. Critical care was time spent personally by me on the following activities: development of treatment plan with patient and/or surrogate as well as nursing, discussions with consultants, evaluation of patient's response to treatment, examination of patient, obtaining history from patient or surrogate, ordering and performing treatments and interventions, ordering and review of laboratory studies, ordering and review of radiographic studies, pulse oximetry and re-evaluation of patient's condition.  Nanda Quinton, MD Emergency Medicine  ____________________________________________   INITIAL IMPRESSION / ASSESSMENT AND PLAN / ED COURSE  Pertinent labs & imaging results that were available during my care of the patient were reviewed by me and considered in my medical decision making (see chart for details).   This patient is Presenting for Evaluation of AMS, which does require a range of treatment options, and is a complaint that involves a high risk of morbidity and mortality.  The Differential Diagnoses includes but is not exclusive to alcohol, illicit or prescription medications, intracranial pathology such as stroke, intracerebral hemorrhage, fever or infectious causes  including sepsis, hypoxemia, uremia, trauma, endocrine related disorders such as diabetes, hypoglycemia, thyroid-related diseases, etc.  Critical Interventions-    Medications  lactated ringers infusion ( Intravenous New Bag/Given 12/30/22 0427)  lactated ringers bolus 500 mL (0 mLs Intravenous Stopped 12/30/22 0523)    Reassessment after intervention: Mental status unchanged. Somnolent but awakens with stimulation. Tolerating CPAP well.    I did obtain Additional Historical Information from [place], as the patient is somnolent intermittently.  I decided to review pertinent External Data, and in  summary labs from [facility] showing AKI with creatinine of 3. Normal troponin, CT head, and CXR. Discharge summary from EEG on 3/13 reviewed as well. No focal seizure.    Clinical Laboratory Tests Ordered, included creatinine of 2.78.  BUN mildly elevated at 34.  VBG with normal CO2 and pH.  Troponin, ammonia, alcohol negative.   Radiologic Tests Ordered, included CXR. I independently interpreted the images and agree with radiology interpretation.   Cardiac Monitor Tracing which shows NSR.    Social Determinants of Health Risk patient is not an active smoker.   Consult complete with ICU (Dr. Ruthann Cancer). Discussed case by phone. Ok with [facility] admit and they can assist as needed.   Spoke with Dr. Marlowe Sax with [facility]. Day team will evaluate for admit.   Medical Decision Making: Summary:  Patient presents to the emergency department for evaluation of altered mental status.  Plan to repeat labs to confirm AKI.  Will give additional IV fluids.  Patient is somnolent but awakens to provide history and participate with exam fully but then falls back asleep.  She has sonorous respirations and known history of COPD.  CO2 from outside labs consistent with prior values.  Doubt CO2 narcosis but do plan for BiPAP as he may have some component of OSA and known COPD. Will not repeat CT head. Suspect poly-pharmacy clinically.   Reevaluation with update and discussion with patient. No worsening clinical status. Plan for admit.   Patient's presentation is most consistent with acute presentation with potential threat to life or bodily function.   Disposition: admit  ____________________________________________  FINAL CLINICAL IMPRESSION(S) / ED DIAGNOSES  Final diagnoses:  AKI (acute kidney injury) ([place])  Somnolence    Note:  This document was prepared using Dragon voice recognition software and may include unintentional dictation errors.  Nanda Quinton, MD, [facility] Emergency Medicine    Everitt Wenner, [facility],  MD 12/30/22 [phone]

## 2022-12-31 DIAGNOSIS — G9341 Metabolic encephalopathy: Secondary | ICD-10-CM | POA: Diagnosis not present

## 2022-12-31 LAB — CBC
HCT: 39 % (ref 36.0–46.0)
Hemoglobin: 12.8 g/dL (ref 12.0–15.0)
MCH: 32 pg (ref 26.0–34.0)
MCHC: 32.8 g/dL (ref 30.0–36.0)
MCV: 97.5 fL (ref 80.0–100.0)
Platelets: 179 10*3/uL (ref 150–400)
RBC: 4 MIL/uL (ref 3.87–5.11)
RDW: 14.4 % (ref 11.5–15.5)
WBC: 8.3 10*3/uL (ref 4.0–10.5)
nRBC: 0 % (ref 0.0–0.2)

## 2022-12-31 LAB — GLUCOSE, CAPILLARY
Glucose-Capillary: 114 mg/dL — ABNORMAL HIGH (ref 70–99)
Glucose-Capillary: 173 mg/dL — ABNORMAL HIGH (ref 70–99)
Glucose-Capillary: 102 mg/dL — ABNORMAL HIGH (ref 70–99)
Glucose-Capillary: 149 mg/dL — ABNORMAL HIGH (ref 70–99)

## 2022-12-31 LAB — COMPREHENSIVE METABOLIC PANEL
ALT: 22 U/L (ref 0–44)
AST: 22 U/L (ref 15–41)
Albumin: 3 g/dL — ABNORMAL LOW (ref 3.5–5.0)
Alkaline Phosphatase: 36 U/L — ABNORMAL LOW (ref 38–126)
Anion gap: 6 (ref 5–15)
BUN: 15 mg/dL (ref 6–20)
CO2: 32 mmol/L (ref 22–32)
Calcium: 9.1 mg/dL (ref 8.9–10.3)
Chloride: 99 mmol/L (ref 98–111)
Creatinine, Ser: 0.61 mg/dL (ref 0.44–1.00)
GFR, Estimated: >60 mL/min (ref >60)
Glucose, Bld: 108 mg/dL — ABNORMAL HIGH (ref 70–99)
Potassium: 3.7 mmol/L (ref 3.5–5.1)
Sodium: 137 mmol/L (ref 135–145)
Total Bilirubin: 0.6 mg/dL (ref 0.3–1.2)
Total Protein: 5.9 g/dL — ABNORMAL LOW (ref 6.5–8.1)

## 2022-12-31 LAB — PHOSPHORUS: Phosphorus: 2.2 mg/dL — ABNORMAL LOW (ref 2.5–4.6)

## 2022-12-31 LAB — UREA NITROGEN, URINE: Urea Nitrogen, Ur: 265 mg/dL

## 2022-12-31 LAB — TSH: TSH: 2.487 u[IU]/mL (ref 0.350–4.500)

## 2022-12-31 LAB — MAGNESIUM: Magnesium: 1.6 mg/dL — ABNORMAL LOW (ref 1.7–2.4)

## 2022-12-31 MED ORDER — DIVALPROEX SODIUM ER 500 MG PO TB24
500.0000 mg | ORAL_TABLET | Freq: Every day | ORAL | Status: DC
  Administered 2022-12-31 – 2023-01-01 (×2): 500 mg via ORAL
  Filled 2022-12-31 (×2): qty 1

## 2022-12-31 MED ORDER — GABAPENTIN 300 MG PO CAPS
300.0000 mg | ORAL_CAPSULE | Freq: Three times a day (TID) | ORAL | Status: DC
  Administered 2022-12-31 – 2023-01-01 (×4): 300 mg via ORAL
  Filled 2022-12-31 (×4): qty 1

## 2022-12-31 MED ORDER — CLONAZEPAM 0.5 MG PO TABS
0.5000 mg | ORAL_TABLET | Freq: Two times a day (BID) | ORAL | Status: DC
  Administered 2022-12-31 – 2023-01-01 (×3): 0.5 mg via ORAL
  Filled 2022-12-31 (×3): qty 1

## 2022-12-31 MED ORDER — DIVALPROEX SODIUM ER 500 MG PO TB24
750.0000 mg | ORAL_TABLET | Freq: Every evening | ORAL | Status: DC
  Administered 2022-12-31: 750 mg via ORAL
  Filled 2022-12-31 (×2): qty 1

## 2022-12-31 NOTE — Progress Notes (Signed)
PROGRESS NOTE    Beverly Tran  [id number] DOB: Feb 02, 1971 DOA: 12/30/2022 PCP: Verdell Face, MD    Brief Narrative:  52 year old with extensive medical issues including essential hypertension, hyperlipidemia, type 2 diabetes, meningioma, COPD and occasional oxygen use, chronic migraines, restless leg syndrome, anxiety, depression, smoker, GERD, recent admission for seizure-like episode who was transferred from [facility] emergency room to [facility] emergency room with concern about seizure and encephalopathy.  Recent hospitalization for seizure workup, LTM with no seizure.  She is on Depakote and Klonopin.  Patient was lethargic on arrival but was maintaining airway and awake.  Serologically normal except creatinine of 3.14 with recent normal renal functions.  Blood gas without CO2 retention.  She was admitted with BiPAP due to lethargy.  Influenza, COVID, RSV negative.  Chest x-ray normal.  CT head normal.  UDS positive for oxycodone.  Transiently hypotensive and given IV fluids.  Admitted with neurology consultation.   Assessment & Plan:   Acute metabolic encephalopathy, found altered by her boyfriend at home. Suspect polypharmacy or cumulative effect of her multiple sedating medications in the context of poor renal clearance. Recent LTM EEG without any seizure-like activities. Clinically improving and mental status normalized now. Start mobilizing, resuming her medications gradually with reduced dose of gabapentin as below.  Acute on chronic hypoxemic respiratory failure: Improved.  She uses 2 L oxygen at home.  Keep on oxygen to keep saturation more than 90%.  Patient does not have evidence of sleep apnea as per patient and does not use CPAP at home.  Her CO2 was normal on arrival.  Acute kidney injury: Likely multifactorial.  Patient is on ACE inhibitors, diuretics, metformin at home.  She was treated with IV fluids and renal functions normalized today. Discontinue IV  fluids.  Encourage oral intake.  Recheck tomorrow morning.  Holding olmesartan hydrochlorothiazide.  Will discuss about alternative medications on discharge.  History of seizure disorder: On Depakote.  Depakote level subtherapeutic.  Resuming today as per neurology recommendations.  Patient also takes Klonopin twice daily.  Will resume today.  Chronic medical issues including COPD, stable Chronic back pain, as above discontinue baclofen and Flexeril.  Reduce use of oxycodone. Changing gabapentin to 300 mg 3 times daily as per neurology recommendations.  Will restart medications today. Restless leg syndrome, continue Mirapex. Insomnia, continue trazodone. Electrolytes, adequate. Hyperlipidemia, on Crestor. Morbid obesity, will benefit with outpatient follow-up.  Type 2 diabetes: Well-controlled.  On Trulicity, Actos and metformin at home.  Currently remains on sliding scale insulin.  She should be able to resume these medications on discharge.   Patient with some clinical improvement today.  Will discontinue IV fluids, monitor oral intake and mobility today.  Gradually introduce her long-term medications.  Anticipate going home tomorrow if continues to do well.    DVT prophylaxis: heparin injection 5,000 Units Start: 12/30/22 1400   Code Status: Full code Family Communication: None at the bedside Disposition Plan: Status is: Inpatient Remains inpatient appropriate because: Significant altered mental status,     Consultants:  Neurology, signed off  Procedures:  None  Antimicrobials:  None   Subjective: Patient was seen and examined.  No overnight events.  Patient tells me she feels tired otherwise she is alert awake and mentally back to herself.  She was just feeling very sleepy at home.  Patient tells me last few days she did not take much water. We went through medications and discussed.  Objective: Vitals:   12/30/22 2112 12/30/22 2340  12/31/22 0311 12/31/22 0758  BP:  (!) 107/51 117/65 108/66 (!) 129/59  Pulse: 95 85 78 83  Resp: 18  19 18   Temp: 98 F (36.7 C) 97.6 F (36.4 C) 97.7 F (36.5 C) 98.3 F (36.8 C)  TempSrc: Oral Oral Oral Oral  SpO2: 99% 99% 97% 99%    Intake/Output Summary (Last 24 hours) at 12/31/2022 1117 Last data filed at 12/31/2022 0900 Gross per 24 hour  Intake 4092.69 ml  Output --  Net 4092.69 ml   There were no vitals filed for this visit.  Examination:  General exam: Appears calm and comfortable  Respiratory system: Clear to auscultation. Respiratory effort normal.  No added sounds.  On minimal oxygen. Cardiovascular system: S1 & S2 heard, RRR. No JVD, murmurs, rubs, gallops or clicks. No pedal edema. Gastrointestinal system: Abdomen is nondistended, soft and nontender. No organomegaly or masses felt. Normal bowel sounds heard. Central nervous system: Alert and oriented. No focal neurological deficits. Extremities: Symmetric 5 x 5 power. Skin: No rashes, lesions or ulcers Psychiatry: Judgement and insight appear normal. Mood & affect appropriate.     Data Reviewed: I have personally reviewed following labs and imaging studies  CBC: Recent Labs  Lab 12/26/22 0905 12/30/22 0400 12/30/22 0405 12/31/22 0507  WBC 8.9 12.5*  --  8.3  NEUTROABS 4.4 6.6  --   --   HGB 13.9 13.9 14.3 12.8  HCT 42.9 42.1 42.0 39.0  MCV 98.8 97.2  --  97.5  PLT 246 212  --  0000000   Basic Metabolic Panel: Recent Labs  Lab 12/26/22 0905 12/30/22 0400 12/30/22 0405 12/31/22 0507  NA 137 135 132* 137  K 3.6 3.4* 3.5 3.7  CL 94* 89*  --  99  CO2 25 30  --  32  GLUCOSE 109* 124*  --  108*  BUN 14 34*  --  15  CREATININE 0.86 2.78*  --  0.61  CALCIUM 9.1 8.8*  --  9.1  MG 1.6*  --   --   --   PHOS 4.3  --   --   --    GFR: Estimated Creatinine Clearance: 93.8 mL/min (by C-G formula based on SCr of 0.61 mg/dL). Liver Function Tests: Recent Labs  Lab 12/26/22 0905 12/30/22 0400 12/31/22 0507  AST 22 30 22   ALT 18 24 22    ALKPHOS 41 45 36*  BILITOT 0.4 0.6 0.6  PROT 6.6 6.5 5.9*  ALBUMIN 3.5 3.6 3.0*   Recent Labs  Lab 12/30/22 0400  LIPASE 30   Recent Labs  Lab 12/30/22 0400  AMMONIA 12   Coagulation Profile: Recent Labs  Lab 12/26/22 0905  INR 0.9   Cardiac Enzymes: Recent Labs  Lab 12/30/22 1155  CKTOTAL 202   BNP (last 3 results) No results for input(s): "PROBNP" in the last 8760 hours. HbA1C: No results for input(s): "HGBA1C" in the last 72 hours. CBG: Recent Labs  Lab 12/28/22 0612 12/28/22 1146 12/30/22 1606 12/30/22 2110 12/31/22 0604  GLUCAP 117* 195* 166* 143* 114*   Lipid Profile: No results for input(s): "CHOL", "HDL", "LDLCALC", "TRIG", "CHOLHDL", "LDLDIRECT" in the last 72 hours. Thyroid Function Tests: Recent Labs    12/31/22 0507  TSH 2.487   Anemia Panel: No results for input(s): "VITAMINB12", "FOLATE", "FERRITIN", "TIBC", "IRON", "RETICCTPCT" in the last 72 hours. Sepsis Labs: No results for input(s): "PROCALCITON", "LATICACIDVEN" in the last 168 hours.  Recent Results (from the past 240 hour(s))  Resp panel by RT-PCR (  RSV, Flu A&B, Covid) Anterior Nasal Swab     Status: None   Collection Time: 12/30/22  3:59 AM   Specimen: Anterior Nasal Swab  Result Value Ref Range Status   SARS Coronavirus 2 by RT PCR NEGATIVE NEGATIVE Final   Influenza A by PCR NEGATIVE NEGATIVE Final   Influenza B by PCR NEGATIVE NEGATIVE Final    Comment: (NOTE) The Xpert Xpress SARS-CoV-2/FLU/RSV plus assay is intended as an aid in the diagnosis of influenza from Nasopharyngeal swab specimens and should not be used as a sole basis for treatment. Nasal washings and aspirates are unacceptable for Xpert Xpress SARS-CoV-2/FLU/RSV testing.  Fact Sheet for Patients: EntrepreneurPulse.com.au  Fact Sheet for Healthcare Providers: IncredibleEmployment.be  This test is not yet approved or cleared by the [place] FDA and has been  authorized for detection and/or diagnosis of SARS-CoV-2 by FDA under an Emergency Use Authorization (EUA). This EUA will remain in effect (meaning this test can be used) for the duration of the COVID-19 declaration under Section 564(b)(1) of the Act, 21 U.S.C. section 360bbb-3(b)(1), unless the authorization is terminated or revoked.     Resp Syncytial Virus by PCR NEGATIVE NEGATIVE Final    Comment: (NOTE) Fact Sheet for Patients: EntrepreneurPulse.com.au  Fact Sheet for Healthcare Providers: IncredibleEmployment.be  This test is not yet approved or cleared by the [place] FDA and has been authorized for detection and/or diagnosis of SARS-CoV-2 by FDA under an Emergency Use Authorization (EUA). This EUA will remain in effect (meaning this test can be used) for the duration of the COVID-19 declaration under Section 564(b)(1) of the Act, 21 U.S.C. section 360bbb-3(b)(1), unless the authorization is terminated or revoked.  Performed at [facility], [place] [address], [place], [place] [address]          Radiology Studies: DG Chest Portable 1 View  Result Date: 12/30/2022 CLINICAL DATA:  Shortness of breath EXAM: PORTABLE CHEST 1 VIEW COMPARISON:  06/28/2022 FINDINGS: Low volume chest with accentuated markings at the bases, there is chronic scarring at the lung bases based on remote imaging. No edema, effusion, or air bronchogram. Normal heart size and mediastinal contours. Artifact from EKG leads. IMPRESSION: No acute finding when compared to priors. Electronically Signed   By: Jorje Guild M.D.   On: 12/30/2022 04:33        Scheduled Meds:  arformoterol  15 mcg Nebulization BID   budesonide (PULMICORT) nebulizer solution  0.5 mg Nebulization BID   clonazePAM  0.5 mg Oral BID   divalproex  500 mg Oral Daily   divalproex  750 mg Oral QPM   gabapentin  300 mg Oral TID   heparin  5,000 Units Subcutaneous Q8H   insulin aspart   0-9 Units Subcutaneous TID WC   montelukast  10 mg Oral QHS   pantoprazole  40 mg Oral Daily   pramipexole  0.5 mg Oral Daily   And   pramipexole  1 mg Oral QHS   rosuvastatin  10 mg Oral QHS   sodium chloride flush  3 mL Intravenous Q12H   traZODone  50 mg Oral QHS   Continuous Infusions:  promethazine (PHENERGAN) injection (IM or IVPB)       LOS: 1 day    Time spent: 35 minutes    Barb Merino, MD [facility] Pager [phone]

## 2023-01-01 DIAGNOSIS — G9341 Metabolic encephalopathy: Secondary | ICD-10-CM | POA: Diagnosis not present

## 2023-01-01 LAB — CBC WITH DIFFERENTIAL/PLATELET
Abs Immature Granulocytes: 0.04 10*3/uL (ref 0.00–0.07)
Basophils Absolute: 0 10*3/uL (ref 0.0–0.1)
Basophils Relative: 1 %
Eosinophils Absolute: 0.2 10*3/uL (ref 0.0–0.5)
Eosinophils Relative: 2 %
HCT: 39 % (ref 36.0–46.0)
Hemoglobin: 12.9 g/dL (ref 12.0–15.0)
Immature Granulocytes: 1 %
Lymphocytes Relative: 58 %
Lymphs Abs: 4.8 10*3/uL — ABNORMAL HIGH (ref 0.7–4.0)
MCH: 32.2 pg (ref 26.0–34.0)
MCHC: 33.1 g/dL (ref 30.0–36.0)
MCV: 97.3 fL (ref 80.0–100.0)
Monocytes Absolute: 0.6 10*3/uL (ref 0.1–1.0)
Monocytes Relative: 7 %
Neutro Abs: 2.5 10*3/uL (ref 1.7–7.7)
Neutrophils Relative %: 31 %
Platelets: 158 10*3/uL (ref 150–400)
RBC: 4.01 MIL/uL (ref 3.87–5.11)
RDW: 13.9 % (ref 11.5–15.5)
WBC: 8.1 10*3/uL (ref 4.0–10.5)
nRBC: 0 % (ref 0.0–0.2)

## 2023-01-01 LAB — BASIC METABOLIC PANEL
Anion gap: 9 (ref 5–15)
BUN: 15 mg/dL (ref 6–20)
CO2: 29 mmol/L (ref 22–32)
Calcium: 9.2 mg/dL (ref 8.9–10.3)
Chloride: 100 mmol/L (ref 98–111)
Creatinine, Ser: 0.64 mg/dL (ref 0.44–1.00)
GFR, Estimated: >60 mL/min (ref >60)
Glucose, Bld: 140 mg/dL — ABNORMAL HIGH (ref 70–99)
Potassium: 4.1 mmol/L (ref 3.5–5.1)
Sodium: 138 mmol/L (ref 135–145)

## 2023-01-01 LAB — GLUCOSE, CAPILLARY: Glucose-Capillary: 113 mg/dL — ABNORMAL HIGH (ref 70–99)

## 2023-01-01 MED ORDER — GABAPENTIN 600 MG PO TABS: 300.0000 mg | ORAL_TABLET | Freq: Three times a day (TID) | ORAL | 3 refills | Status: DC

## 2023-01-01 MED ORDER — K PHOS MONO-SOD PHOS DI & MONO 155-852-130 MG PO TABS
250.0000 mg | ORAL_TABLET | Freq: Three times a day (TID) | ORAL | Status: DC
  Administered 2023-01-01: 250 mg via ORAL
  Filled 2023-01-01: qty 1

## 2023-01-01 MED ORDER — MAGNESIUM OXIDE -MG SUPPLEMENT 400 (240 MG) MG PO TABS: 400.0000 mg | ORAL_TABLET | Freq: Every day | ORAL | 0 refills | Status: AC

## 2023-01-01 MED ORDER — MAGNESIUM SULFATE 2 GM/50ML IV SOLN
2.0000 g | Freq: Once | INTRAVENOUS | Status: DC
  Filled 2023-01-01: qty 50

## 2023-01-01 MED ORDER — MAGNESIUM OXIDE -MG SUPPLEMENT 400 (240 MG) MG PO TABS
800.0000 mg | ORAL_TABLET | Freq: Every day | ORAL | Status: DC
  Administered 2023-01-01: 800 mg via ORAL
  Filled 2023-01-01: qty 2

## 2023-01-01 MED ORDER — K PHOS MONO-SOD PHOS DI & MONO 155-852-130 MG PO TABS: 250.0000 mg | ORAL_TABLET | Freq: Three times a day (TID) | ORAL | 0 refills | Status: AC

## 2023-01-01 NOTE — Discharge Summary (Signed)
Physician Discharge Summary  Beverly Tran [id number] DOB: [birth year] DOA: 12/30/2022  PCP: Verdell Face, MD  Admit date: 12/30/2022 Discharge date: 01/01/2023  Admitted From: Home Disposition: Home  Recommendations for Outpatient Follow-up:  Follow up with PCP in 1-2 weeks Please obtain BMP/CBC/magnesium/phosphorus in one week   Home Health: N/A Equipment/Devices: Already available at home  Discharge Condition: Stable CODE STATUS: Full code Diet recommendation: Low-salt diet, low-carb diet  Discharge summary: 52 year old with extensive medical issues including essential hypertension, hyperlipidemia, type 2 diabetes, meningioma, COPD and occasional oxygen use, chronic migraines, restless leg syndrome, anxiety, depression, smoker, GERD, recent admission for seizure-like episode who was transferred from [facility] emergency room to [facility] emergency room with concern about seizure and encephalopathy.  Recent hospitalization for seizure workup, LTM with no seizure.  She is on Depakote and Klonopin.  Patient was lethargic on arrival but was maintaining airway and awake.   Serologically normal except creatinine of 3.14 with recent normal renal functions.  Blood gas without CO2 retention.  She was admitted with BiPAP due to lethargy.  Influenza, COVID, RSV negative.  Chest x-ray normal.  CT head normal.  UDS positive for oxycodone.  Transiently hypotensive and given IV fluids.  Admitted with neurology consultation.     Assessment & plan of care:   Acute metabolic encephalopathy, found altered by her boyfriend at home. Suspect polypharmacy or cumulative effect of her multiple sedating medications in the context of poor renal clearance. Recent LTM EEG without any seizure-like activities. Clinically improving and mental status normalized now. Monitor after resuming all her home medications and remains a stable.   Acute on chronic hypoxemic respiratory failure: Improved.  She  uses 2 L oxygen at home.  Keep on oxygen to keep saturation more than 90%.  Patient does not have evidence of sleep apnea as per patient and does not use CPAP at home.  Her CO2 was normal on arrival.   Acute kidney injury: Likely multifactorial.  Patient is on ACE inhibitors, diuretics, metformin at home.  She was treated with IV fluids and renal functions normalized. Monitored off IV fluids.  Stable. Go back on olmesartan hydrochlorothiazide, discontinue Lasix.   History of seizure disorder: On Depakote.  Depakote level subtherapeutic.  Klonopin and Depakote resumed.   Chronic medical issues including COPD, stable Chronic back pain, as above discontinue baclofen and Flexeril.  Reduce use of oxycodone. Changing gabapentin to 300 mg 3 times daily as per neurology recommendations.   Restless leg syndrome, continue Mirapex. Insomnia, continue trazodone. Electrolytes, adequate.  Replaced before discharge.  Magnesium supplementation on discharge. Hyperlipidemia, on Crestor. Morbid obesity, will benefit with outpatient follow-up.   Type 2 diabetes: Well-controlled.  On Trulicity, Actos and metformin at home.  Currently remains on sliding scale insulin.  She should be able to resume these medications on discharge.     Patient with some clinical improvement today.  Will discontinue IV fluids, monitor oral intake and mobility today.  Gradually introduce her long-term medications.  Anticipate going home tomorrow if continues to do well.   Discharge Diagnoses:  Principal Problem:   Acute metabolic encephalopathy Active Problems:   Acute on chronic respiratory failure with hypoxia and hypercapnia (HCC)   AKI (acute kidney injury) ([place])   Seizure (HCC)   Transient hypotension   Hypokalemia   Hypocalcemia   Type 2 diabetes mellitus with hyperlipidemia (HCC)   Chronic obstructive pulmonary disease (HCC)   Chronic back pain   RLS (restless legs syndrome)  Gastroesophageal reflux disease  without esophagitis   Class 2 obesity   Tobacco abuse    Discharge Instructions  Discharge Instructions     Diet - low sodium heart healthy   Complete by: As directed    Diet Carb Modified   Complete by: As directed    Increase activity slowly   Complete by: As directed       Allergies as of 01/01/2023       Reactions   Bee Venom Anaphylaxis, Swelling   WASP/HORNET   Diclofenac Anaphylaxis   Salami [pickled Meat] Shortness Of Breath, Swelling   Also Allergic to POPCORN: same reaction   Aspirin Other (See Comments)   Nose bleeds   Compazine [prochlorperazine] Hives   Imitrex [sumatriptan] Hives   Lamotrigine Other (See Comments)   confusion   Lisinopril     Unknown   Nubain [nalbuphine Hcl] Swelling, Other (See Comments)   Eye swelling   Thorazine [chlorpromazine] Hives   Topamax [topiramate] Other (See Comments)   Confusion and out of it   Voltaren [diclofenac Sodium] Swelling   Zofran [ondansetron Hcl] Nausea Only        Medication List     STOP taking these medications    furosemide 40 MG tablet Commonly known as: LASIX       TAKE these medications    albuterol 108 (90 Base) MCG/ACT inhaler Commonly known as: VENTOLIN HFA Inhale 2 puffs into the lungs as needed for wheezing or shortness of breath.   clonazePAM 0.5 MG tablet Commonly known as: KLONOPIN Take 1 tablet (0.5 mg total) by mouth 2 (two) times daily.   divalproex 250 MG 24 hr tablet Commonly known as: DEPAKOTE ER Take 2 tablets in AM, 3 tablets in PM What changed:  how much to take how to take this when to take this additional instructions   EPINEPHrine 0.3 mg/0.3 mL Soaj injection Commonly known as: EPI-PEN Inject 0.3 mg into the muscle as needed for anaphylaxis.   famotidine 20 MG tablet Commonly known as: PEPCID Take 1 tablet (20 mg total) by mouth daily.   gabapentin 600 MG tablet Commonly known as: NEURONTIN Take 0.5 tablets (300 mg total) by mouth 3 (three) times  daily. What changed: how much to take   ipratropium-albuterol 0.5-2.5 (3) MG/3ML Soln Commonly known as: DUONEB Take 3 mLs by nebulization as needed (shortness of breath or wheezing).   magnesium oxide 400 (240 Mg) MG tablet Commonly known as: MAG-OX Take 1 tablet (400 mg total) by mouth daily for 7 days. Start taking on: January 02, 2023   metFORMIN 500 MG tablet Commonly known as: GLUCOPHAGE Take 500 mg by mouth 2 (two) times daily with a meal.   montelukast 10 MG tablet Commonly known as: SINGULAIR Take 10 mg by mouth daily.   naloxone 4 MG/0.1ML Liqd nasal spray kit Commonly known as: NARCAN Place 0.4 mg into the nose once.   olmesartan-hydrochlorothiazide 20-12.5 MG tablet Commonly known as: BENICAR HCT Take 1 tablet by mouth daily.   pantoprazole 40 MG tablet Commonly known as: PROTONIX TAKE ONE TABLET BY MOUTH DAILY (MORNING) What changed: See the new instructions.   phosphorus 155-852-130 MG tablet Commonly known as: K PHOS NEUTRAL Take 1 tablet (250 mg total) by mouth 3 (three) times daily for 7 days.   pioglitazone 45 MG tablet Commonly known as: ACTOS Take 45 mg by mouth daily.   pramipexole 1 MG tablet Commonly known as: MIRAPEX Take 1/2 tablet in AM, 1 tablet  in PM What changed:  how much to take how to take this when to take this additional instructions   promethazine 25 MG tablet Commonly known as: PHENERGAN TAKE ONE TABLET BY MOUTH EVERY 8 HOURS AS NEEDED FOR NAUSEA AND VOMITING What changed: See the new instructions.   rosuvastatin 10 MG tablet Commonly known as: CRESTOR Take 10 mg by mouth at bedtime.   Symbicort 80-4.5 MCG/ACT inhaler Generic drug: budesonide-formoterol Take 2 puffs first thing in am and then another 2 puffs about 12 hours later. What changed:  how much to take how to take this when to take this additional instructions   traZODone 50 MG tablet Commonly known as: DESYREL Take 50 mg by mouth at bedtime.    Trulicity 3 0000000 Sopn Generic drug: Dulaglutide Inject 3 mg into the skin once a week.        Allergies  Allergen Reactions   Bee Venom Anaphylaxis and Swelling    WASP/HORNET   Diclofenac Anaphylaxis   Salami [Pickled Meat] Shortness Of Breath and Swelling    Also Allergic to POPCORN: same reaction   Aspirin Other (See Comments)    Nose bleeds   Compazine [Prochlorperazine] Hives   Imitrex [Sumatriptan] Hives   Lamotrigine Other (See Comments)    confusion   Lisinopril      Unknown   Nubain [Nalbuphine Hcl] Swelling and Other (See Comments)    Eye swelling   Thorazine [Chlorpromazine] Hives   Topamax [Topiramate] Other (See Comments)    Confusion and out of it   Voltaren [Diclofenac Sodium] Swelling   Zofran [Ondansetron Hcl] Nausea Only    Consultations: Neurology   Procedures/Studies: DG Chest Portable 1 View  Result Date: 12/30/2022 CLINICAL DATA:  Shortness of breath EXAM: PORTABLE CHEST 1 VIEW COMPARISON:  06/28/2022 FINDINGS: Low volume chest with accentuated markings at the bases, there is chronic scarring at the lung bases based on remote imaging. No edema, effusion, or air bronchogram. Normal heart size and mediastinal contours. Artifact from EKG leads. IMPRESSION: No acute finding when compared to priors. Electronically Signed   By: Jorje Guild M.D.   On: 12/30/2022 04:33   Overnight EEG with video  Result Date: 12/27/2022 Lora Havens, MD     12/27/2022 11:06 AM Patient Name: CHRISOULA HAPEMAN MRN: [id number] Epilepsy Attending: Lora Havens Referring Physician/Provider: Lora Havens, MD Duration: 12/26/2022 0857 to 12/27/2022 0857 Patient history: 52 year old female admitted to epilepsy monitoring unit for characterization of jerking in bilateral upper extremities. EEG to evaluate for seizure Level of alertness: Awake, asleep AEDs during EEG study: GBP Technical aspects: This EEG study was done with scalp electrodes positioned according to  the 10-20 International system of electrode placement. Electrical activity was reviewed with band pass filter of 1-70Hz , sensitivity of 7 uV/mm, display speed of 61mm/sec with a 60Hz  notched filter applied as appropriate. EEG data were recorded continuously and digitally stored.  Video monitoring was available and reviewed as appropriate. Description: The posterior dominant rhythm consists of 8-9 Hz activity of moderate voltage (25-35 uV) seen predominantly in posterior head regions, symmetric and reactive to eye opening and eye closing. Sleep was characterized by vertex waves, sleep spindles (12 to 14 Hz), maximal frontocentral region.  Hyperventilation and photic stimulation were not performed.   IMPRESSION: This study is within normal limits. No seizures or epileptiform discharges were seen throughout the recording. A normal interictal EEG does not exclude the diagnosis of epilepsy. Priyanka Barbra Sarks   (Echo, Carotid, EGD,  Colonoscopy, ERCP)    Subjective: Patient seen and examined.  Denies any complaints.  Eager to go home.  Denies any nausea or vomiting.  Her appetite is fair.   Discharge Exam: Vitals:   12/31/22 2042 12/31/22 2048  BP: 137/70   Pulse: 85   Resp: 18   Temp: 98.4 F (36.9 C)   SpO2: 98% 98%   Vitals:   12/31/22 1208 12/31/22 1542 12/31/22 2042 12/31/22 2048  BP: 134/66 (!) 122/57 137/70   Pulse: 88 82 85   Resp: 18 18 18    Temp: 97.6 F (36.4 C) 98.6 F (37 C) 98.4 F (36.9 C)   TempSrc: Oral Oral Oral   SpO2: 100% 97% 98% 98%    General: Pt is alert, awake, not in acute distress Cardiovascular: RRR, S1/S2 +, no rubs, no gallops Respiratory: CTA bilaterally, no wheezing, no rhonchi Abdominal: Soft, NT, ND, bowel sounds + Extremities: no edema, no cyanosis    The results of significant diagnostics from this hospitalization (including imaging, microbiology, ancillary and laboratory) are listed below for reference.     Microbiology: Recent Results (from the  past 240 hour(s))  Resp panel by RT-PCR (RSV, Flu A&B, Covid) Anterior Nasal Swab     Status: None   Collection Time: 12/30/22  3:59 AM   Specimen: Anterior Nasal Swab  Result Value Ref Range Status   SARS Coronavirus 2 by RT PCR NEGATIVE NEGATIVE Final   Influenza A by PCR NEGATIVE NEGATIVE Final   Influenza B by PCR NEGATIVE NEGATIVE Final    Comment: (NOTE) The Xpert Xpress SARS-CoV-2/FLU/RSV plus assay is intended as an aid in the diagnosis of influenza from Nasopharyngeal swab specimens and should not be used as a sole basis for treatment. Nasal washings and aspirates are unacceptable for Xpert Xpress SARS-CoV-2/FLU/RSV testing.  Fact Sheet for Patients: EntrepreneurPulse.com.au  Fact Sheet for Healthcare Providers: IncredibleEmployment.be  This test is not yet approved or cleared by the [place] FDA and has been authorized for detection and/or diagnosis of SARS-CoV-2 by FDA under an Emergency Use Authorization (EUA). This EUA will remain in effect (meaning this test can be used) for the duration of the COVID-19 declaration under Section 564(b)(1) of the Act, 21 U.S.C. section 360bbb-3(b)(1), unless the authorization is terminated or revoked.     Resp Syncytial Virus by PCR NEGATIVE NEGATIVE Final    Comment: (NOTE) Fact Sheet for Patients: EntrepreneurPulse.com.au  Fact Sheet for Healthcare Providers: IncredibleEmployment.be  This test is not yet approved or cleared by the [place] FDA and has been authorized for detection and/or diagnosis of SARS-CoV-2 by FDA under an Emergency Use Authorization (EUA). This EUA will remain in effect (meaning this test can be used) for the duration of the COVID-19 declaration under Section 564(b)(1) of the Act, 21 U.S.C. section 360bbb-3(b)(1), unless the authorization is terminated or revoked.  Performed at [facility], [place] [address],  [place], [place] [address]      Labs: BNP (last 3 results) Recent Labs    02/16/22 1749 02/24/22 1218  BNP 266.0* 99991111   Basic Metabolic Panel: Recent Labs  Lab 12/26/22 0905 12/30/22 0400 12/30/22 0405 12/31/22 0507 01/01/23 0421  NA 137 135 132* 137 138  K 3.6 3.4* 3.5 3.7 4.1  CL 94* 89*  --  99 100  CO2 25 30  --  32 29  GLUCOSE 109* 124*  --  108* 140*  BUN 14 34*  --  15 15  CREATININE 0.86 2.78*  --  0.61 0.64  CALCIUM 9.1 8.8*  --  9.1 9.2  MG 1.6*  --   --  1.6*  --   PHOS 4.3  --   --  2.2*  --    Liver Function Tests: Recent Labs  Lab 12/26/22 0905 12/30/22 0400 12/31/22 0507  AST 22 30 22   ALT 18 24 22   ALKPHOS 41 45 36*  BILITOT 0.4 0.6 0.6  PROT 6.6 6.5 5.9*  ALBUMIN 3.5 3.6 3.0*   Recent Labs  Lab 12/30/22 0400  LIPASE 30   Recent Labs  Lab 12/30/22 0400  AMMONIA 12   CBC: Recent Labs  Lab 12/26/22 0905 12/30/22 0400 12/30/22 0405 12/31/22 0507 01/01/23 0421  WBC 8.9 12.5*  --  8.3 8.1  NEUTROABS 4.4 6.6  --   --  2.5  HGB 13.9 13.9 14.3 12.8 12.9  HCT 42.9 42.1 42.0 39.0 39.0  MCV 98.8 97.2  --  97.5 97.3  PLT 246 212  --  179 158   Cardiac Enzymes: Recent Labs  Lab 12/30/22 1155  CKTOTAL 202   BNP: Invalid input(s): "POCBNP" CBG: Recent Labs  Lab 12/31/22 0604 12/31/22 1232 12/31/22 1659 12/31/22 2148 01/01/23 0636  GLUCAP 114* 173* 102* 149* 113*   D-Dimer No results for input(s): "DDIMER" in the last 72 hours. Hgb A1c No results for input(s): "HGBA1C" in the last 72 hours. Lipid Profile No results for input(s): "CHOL", "HDL", "LDLCALC", "TRIG", "CHOLHDL", "LDLDIRECT" in the last 72 hours. Thyroid function studies Recent Labs    12/31/22 0507  TSH 2.487   Anemia work up No results for input(s): "VITAMINB12", "FOLATE", "FERRITIN", "TIBC", "IRON", "RETICCTPCT" in the last 72 hours. Urinalysis    Component Value Date/Time   COLORURINE YELLOW 12/30/2022 0920   APPEARANCEUR CLEAR 12/30/2022 0920    LABSPEC 1.010 12/30/2022 0920   PHURINE 5.0 12/30/2022 0920   GLUCOSEU NEGATIVE 12/30/2022 0920   HGBUR NEGATIVE 12/30/2022 0920   BILIRUBINUR NEGATIVE 12/30/2022 0920   KETONESUR NEGATIVE 12/30/2022 0920   PROTEINUR NEGATIVE 12/30/2022 0920   UROBILINOGEN 0.2 08/18/2010 2007   NITRITE NEGATIVE 12/30/2022 0920   LEUKOCYTESUR NEGATIVE 12/30/2022 0920   Sepsis Labs Recent Labs  Lab 12/26/22 0905 12/30/22 0400 12/31/22 0507 01/01/23 0421  WBC 8.9 12.5* 8.3 8.1   Microbiology Recent Results (from the past 240 hour(s))  Resp panel by RT-PCR (RSV, Flu A&B, Covid) Anterior Nasal Swab     Status: None   Collection Time: 12/30/22  3:59 AM   Specimen: Anterior Nasal Swab  Result Value Ref Range Status   SARS Coronavirus 2 by RT PCR NEGATIVE NEGATIVE Final   Influenza A by PCR NEGATIVE NEGATIVE Final   Influenza B by PCR NEGATIVE NEGATIVE Final    Comment: (NOTE) The Xpert Xpress SARS-CoV-2/FLU/RSV plus assay is intended as an aid in the diagnosis of influenza from Nasopharyngeal swab specimens and should not be used as a sole basis for treatment. Nasal washings and aspirates are unacceptable for Xpert Xpress SARS-CoV-2/FLU/RSV testing.  Fact Sheet for Patients: EntrepreneurPulse.com.au  Fact Sheet for Healthcare Providers: IncredibleEmployment.be  This test is not yet approved or cleared by the [place] FDA and has been authorized for detection and/or diagnosis of SARS-CoV-2 by FDA under an Emergency Use Authorization (EUA). This EUA will remain in effect (meaning this test can be used) for the duration of the COVID-19 declaration under Section 564(b)(1) of the Act, 21 U.S.C. section 360bbb-3(b)(1), unless the authorization is terminated or revoked.  Resp Syncytial Virus by PCR NEGATIVE NEGATIVE Final    Comment: (NOTE) Fact Sheet for Patients: EntrepreneurPulse.com.au  Fact Sheet for Healthcare  Providers: IncredibleEmployment.be  This test is not yet approved or cleared by the [place] FDA and has been authorized for detection and/or diagnosis of SARS-CoV-2 by FDA under an Emergency Use Authorization (EUA). This EUA will remain in effect (meaning this test can be used) for the duration of the COVID-19 declaration under Section 564(b)(1) of the Act, 21 U.S.C. section 360bbb-3(b)(1), unless the authorization is terminated or revoked.  Performed at [facility], [place] [address], [place], [place] [address]      Time coordinating discharge: 40 minutes  SIGNED:   Barb Merino, MD  [facility] 01/01/2023, 10:29 AM

## 2023-01-02 ENCOUNTER — Telehealth: Payer: Self-pay | Admitting: Anesthesiology

## 2023-01-02 LAB — HEMOGLOBIN A1C
Hgb A1c MFr Bld: 7.1 % — ABNORMAL HIGH (ref 4.8–5.6)
Mean Plasma Glucose: 157 mg/dL

## 2023-01-02 MED ORDER — GABAPENTIN 300 MG PO CAPS: 300.0000 mg | ORAL_CAPSULE | Freq: Three times a day (TID) | ORAL | 6 refills | Status: DC

## 2023-01-02 NOTE — Telephone Encounter (Signed)
Pt called stating that she needs a new Rx for her gabapentin. States when she was in the hospital last week the doctor changed her gabapentin Rx to 300 mg tid.

## 2023-01-02 NOTE — Telephone Encounter (Signed)
Pt called an informed that Dr Delice Lesch sent in Gabapentin 300mg  cap: take 1 cap three times a day.

## 2023-01-02 NOTE — Telephone Encounter (Signed)
Pls let her know I sent in Gabapentin 300mg  cap: take 1 cap three times a day. Thanks

## 2023-01-02 NOTE — Telephone Encounter (Signed)
See other phone note

## 2023-01-03 ENCOUNTER — Telehealth: Payer: Self-pay | Admitting: Internal Medicine

## 2023-01-03 ENCOUNTER — Other Ambulatory Visit: Payer: Self-pay | Admitting: Gastroenterology

## 2023-01-03 DIAGNOSIS — Z79891 Long term (current) use of opiate analgesic: Secondary | ICD-10-CM

## 2023-01-03 NOTE — Telephone Encounter (Signed)
ClaritonD or allergra D best options - has to show license to pharmacy but no need for prescription

## 2023-01-03 NOTE — Telephone Encounter (Signed)
Patient states that she was in the hospital recently due to medication reaction and caught a sinus cold. Patient would like to know if something can be called into [place]. Please advise  Patient also wanted Dr. [name] to know that she is trying to get a PFT test prior to her appointment on 01/30/2023.

## 2023-01-03 NOTE — Telephone Encounter (Signed)
Read PT Dr. Jamelle Haring comments/suggestion. She expressed understanding.

## 2023-01-03 NOTE — Telephone Encounter (Signed)
ATC but no answer. If patient calls back tell her per Dr Jamse Mead or Berneda Rose D best options - has to show license to pharmacy but no need for prescription

## 2023-01-03 NOTE — Telephone Encounter (Signed)
Called and spoke to patient.  She states she was in the hospital for med reaction from gabapentin and baclofen.  States since then she has what she feels like is a cold. She says she is coughing like normal but is also sneezing and has a lot of nasal drainage and at night time her nose is stopped up.  She has no fever and no SOB  She states she cannot take flonase.  Would like to know if there is something we can send in for her.   She also wants to make sure Dr. [name] knows she is trying to get PFT scheduled before June but has an appt for PFT done 03/28/23.   Dr. [name] please advise.

## 2023-01-09 ENCOUNTER — Telehealth: Payer: Self-pay | Admitting: Anesthesiology

## 2023-01-09 NOTE — Telephone Encounter (Signed)
Pt called an informed The EEG was normal, there was no seizure activity seen on her EEG. Gabapentin can cause body jerks, most likely this is the culprit. For now continue on seizure medication but we may reduce on her next visit. She has not had any jerking with the decrease of gabapentin

## 2023-01-09 NOTE — Telephone Encounter (Signed)
Pt called requesting EEG results.  °

## 2023-01-09 NOTE — Telephone Encounter (Signed)
The EEG was normal, there was no seizure activity seen on her EEG. Gabapentin can cause body jerks, most likely this is the culprit. For now continue on seizure medication but we may reduce on her next visit. Thanks

## 2023-01-17 ENCOUNTER — Ambulatory Visit: Payer: Medicaid Other | Admitting: Pulmonary Disease

## 2023-01-17 ENCOUNTER — Encounter: Payer: Self-pay | Admitting: Pulmonary Disease

## 2023-01-17 ENCOUNTER — Telehealth: Payer: Self-pay

## 2023-01-17 VITALS — BP 132/82 | HR 100 | Ht 63.0 in | Wt 218.2 lb

## 2023-01-17 DIAGNOSIS — F5101 Primary insomnia: Secondary | ICD-10-CM

## 2023-01-17 MED ORDER — RAMELTEON 8 MG PO TABS: 8.0000 mg | ORAL_TABLET | Freq: Every day | ORAL | 3 refills | Status: DC

## 2023-01-17 NOTE — Progress Notes (Signed)
[facility] Tran, Critical Care, and Sleep Medicine  Chief Complaint  Patient presents with   Consult    Sleep consult for Insomnia  Sees Dr. Melvyn Tran for Tran issues    Past Surgical History:  She  has a past surgical history that includes Cesarean section; Tubal ligation; Cholecystectomy; Appendectomy; Hernia repair; Esophagogastroduodenoscopy (2003); Colonoscopy (2003); and TEE without cardioversion (N/A, 03/03/2022).  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 female with insomnia.      Subjective:   She started having trouble with her sleep since she was  teenager.  She goes to bed at 10 pm, but doesn't feel sleepy until 2 am.  Once she is asleep she can stay asleep until about 7 am.  She feels okay when she wakes up in the morning, but gets more tired during the day.  She watches TV at night before going to bed.  She tried seroquel before and this helped, but interacted with some of her other medications.  She has been using trazodone, but this hasn't helped.  She is not aware of snoring at night.  She had a negative sleep study in 2021.  She is followed by Tran for restless legs.  She feels this is under control with current medication regimen.  She does not feel like she is currently depressed.  She is not using anything during the day to help stay awake  Physical Exam:   Appearance - well kempt   ENMT - no sinus tenderness, no oral exudate, no LAN, Mallampati 3 airway, no stridor  Respiratory - equal breath sounds bilaterally, no wheezing or rales  CV - s1s2 regular rate and rhythm, no murmurs  Ext - no clubbing, no  edema  Skin - no rashes  Psych - normal mood and affect   Tran testing:    Sleep Tests:  PSG 09/21/20 >> AHI 0.6, SpO2 low 82%  Cardiac Tests:  Echo 02/17/22 >> EF 60 to 65%  Social History:  She  reports that she quit smoking about 2 years ago. Her smoking use included cigarettes. She has a 7.00 pack-year smoking history. She has never used smokeless tobacco. She reports that she does not drink alcohol and does not use drugs.  Family History:  Her family history includes Heart failure in her father and mother; Seizures in her mother.     Assessment/Plan:   Insomnia. - she might also have a component of delayed sleep phase - trazodone was not effective and seroquel interacted with her other medications - will have her try ramelteon 8 mg at 12:30 am and set her sleep time to 1:30 am to 7 am - discussed stimulus control, and relaxation techniques - advised her to avoid bright light sources close to bed time, and try to get natural light sources first thing in the morning  Asthma, Upper airway cough syndrome. - followed by Dr. Melvyn Tran  Absence seizures, Neuropathy, Restless leg syndrome. - followed by Dr. Ellouise Tran with [facility]  Time Spent Involved in Patient Care on Day of Examination:  52 minutes  Follow  up:   Patient Instructions  Take rozerem 8 mg at 12:30 am.  Go to bed at 1:30 am.  Get out of bed at 7 am.  Follow up in 8 weeks.  Medication List:   Allergies as of 01/17/2023       Reactions   Bee Venom Anaphylaxis, Swelling   WASP/HORNET   Diclofenac Anaphylaxis   Salami [pickled Meat] Shortness Of Breath, Swelling   Also Allergic to POPCORN: same reaction   Aspirin Other (See Comments)   Nose bleeds   Compazine [prochlorperazine] Hives   Imitrex [sumatriptan] Hives   Lamotrigine Other (See Comments)   confusion   Lisinopril     Unknown   Nubain [nalbuphine Hcl] Swelling, Other (See Comments)   Eye swelling   Thorazine [chlorpromazine]  Hives   Topamax [topiramate] Other (See Comments)   Confusion and out of it   Voltaren [diclofenac Sodium] Swelling   Zofran [ondansetron Hcl] Nausea Only        Medication List        Accurate as of January 17, 2023 11:26 AM. If you have any questions, ask your nurse or doctor.          STOP taking these medications    traZODone 50 MG tablet Commonly known as: DESYREL Stopped by: Beverly Mires, MD       TAKE these medications    albuterol 108 (90 Base) MCG/ACT inhaler Commonly known as: VENTOLIN HFA Inhale 2 puffs into the lungs as needed for wheezing or shortness of breath.   clonazePAM 0.5 MG tablet Commonly known as: KLONOPIN Take 1 tablet (0.5 mg total) by mouth 2 (two) times daily.   divalproex 250 MG 24 hr tablet Commonly known as: DEPAKOTE ER Take 2 tablets in AM, 3 tablets in PM What changed:  how much to take how to take this when to take this additional instructions   EPINEPHrine 0.3 mg/0.3 mL Soaj injection Commonly known as: EPI-PEN Inject 0.3 mg into the muscle as needed for anaphylaxis.   famotidine 20 MG tablet Commonly known as: PEPCID Take 1 tablet (20 mg total) by mouth daily.   gabapentin 300 MG capsule Commonly known as: NEURONTIN Take 1 capsule (300 mg total) by mouth 3 (three) times daily.   ipratropium-albuterol 0.5-2.5 (3) MG/3ML Soln Commonly known as: DUONEB Take 3 mLs by nebulization as needed (shortness of breath or wheezing).   metFORMIN 500 MG tablet Commonly known as: GLUCOPHAGE Take 500 mg by mouth 2 (two) times daily with a meal.   montelukast 10 MG tablet Commonly known as: SINGULAIR Take 10 mg by mouth daily.   naloxone 4 MG/0.1ML Liqd nasal spray kit Commonly known as: NARCAN Place 0.4 mg into the nose once.   olmesartan-hydrochlorothiazide 20-12.5 MG tablet Commonly known as: BENICAR HCT Take 1 tablet by mouth daily.   pantoprazole 40 MG tablet Commonly known as: PROTONIX TAKE ONE TABLET BY MOUTH DAILY  (MORNING) What changed: See the new instructions.   pioglitazone 45 MG tablet Commonly known as: ACTOS Take 45 mg by mouth daily.   pramipexole 1 MG tablet Commonly known as: MIRAPEX Take 1/2 tablet in AM, 1 tablet in PM What changed:  how much to take how to take this when to take this additional instructions   promethazine 25 MG tablet Commonly known as: PHENERGAN TAKE ONE TABLET BY MOUTH EVERY 8 HOURS AS NEEDED FOR NAUSEA AND VOMITING What changed: See the new instructions.   ramelteon 8 MG tablet Commonly known as:  ROZEREM Take 1 tablet (8 mg total) by mouth at bedtime. Started by: Beverly Mires, MD   rosuvastatin 10 MG tablet Commonly known as: CRESTOR Take 10 mg by mouth at bedtime.   Symbicort 80-4.5 MCG/ACT inhaler Generic drug: budesonide-formoterol Take 2 puffs first thing in am and then another 2 puffs about 12 hours later. What changed:  how much to take how to take this when to take this additional instructions   Trulicity 3 0000000 Sopn Generic drug: Dulaglutide Inject 3 mg into the skin once a week.        Signature:  Beverly Mires, MD [place] Pager - [phone] 01/17/2023, 11:26 AM

## 2023-01-17 NOTE — Patient Instructions (Signed)
Take rozerem 8 mg at 12:30 am.  Go to bed at 1:30 am.  Get out of bed at 7 am.  Follow up in 8 weeks.

## 2023-01-17 NOTE — Telephone Encounter (Signed)
PA request received via CMM for Ramelteon 8MG  tablets  PA has been submitted to [employer] and is pending determination  Key: [id number]

## 2023-01-19 NOTE — Telephone Encounter (Signed)
PA has been APPROVED through 07/19/2023

## 2023-01-20 ENCOUNTER — Telehealth: Payer: Self-pay | Admitting: Internal Medicine

## 2023-01-20 DIAGNOSIS — J449 Chronic obstructive pulmonary disease, unspecified: Secondary | ICD-10-CM

## 2023-01-20 DIAGNOSIS — J9611 Chronic respiratory failure with hypoxia: Secondary | ICD-10-CM

## 2023-01-20 NOTE — Telephone Encounter (Signed)
Pt called the office back about the pulse ox that she has. States that the other day she put it on her finger and she said it showed that her sats were in the 50s but she was not gasping for air or in any distress.  Pt said that her spouse ended up calling EMS and when they came out to check, her sats were actually in the 90s. They said that her pulse ox probably not be accurate and told her that she needs to get a better one that would be more accurate.  Pt is needing to have an order for pulse ox to be faxed to [phone] which is for DME [facility].  Dr. Sherene Sires, please advise on this for pt.

## 2023-01-20 NOTE — Telephone Encounter (Signed)
Pt calling bc she wants to ask about her post op oxygen refill.

## 2023-01-22 NOTE — Telephone Encounter (Signed)
Fine to order one but doubt it's covered as DME as available over the counter for the same basic equipment.   If breathing ok would not be checking it for now and if breathing is not fine then ov next available with whatever equipment she has in hand

## 2023-01-23 NOTE — Telephone Encounter (Signed)
Spoke with patient. She advises insurance will cover pulse ox through [facility]. Order has been placed for Pulse ox. NFN

## 2023-01-30 ENCOUNTER — Ambulatory Visit: Payer: Medicaid Other | Admitting: Internal Medicine

## 2023-01-31 ENCOUNTER — Other Ambulatory Visit: Payer: Self-pay

## 2023-01-31 ENCOUNTER — Telehealth: Payer: Self-pay | Admitting: Neurology

## 2023-01-31 MED ORDER — NORTRIPTYLINE HCL 25 MG PO CAPS: 25.0000 mg | ORAL_CAPSULE | Freq: Every day | ORAL | 3 refills | Status: DC

## 2023-01-31 NOTE — Telephone Encounter (Signed)
Pt was called and informed of Dr. Karel Jarvis results and made order for prescription.

## 2023-01-31 NOTE — Telephone Encounter (Signed)
Patient wants to speak to someone about the Gabapentin and neuropathy she is having please call

## 2023-01-31 NOTE — Telephone Encounter (Signed)
We can try a different medication for neuropathy called nortriptyline. We have to start at a low dose to make sure she is tolerating it. Main side effect is drowsiness, sometimes dry mouth. Start nortriptyline : take 1 capsule at night. Continue gabapentin  TID for now. Pls send Rx for nortriptyline if she agrees, thanks

## 2023-02-04 ENCOUNTER — Other Ambulatory Visit: Payer: Self-pay | Admitting: Neurology

## 2023-02-07 ENCOUNTER — Telehealth: Payer: Self-pay | Admitting: Neurology

## 2023-02-07 ENCOUNTER — Encounter: Payer: Self-pay | Admitting: Gastroenterology

## 2023-02-07 ENCOUNTER — Ambulatory Visit: Payer: Medicaid Other | Admitting: Gastroenterology

## 2023-02-07 NOTE — Telephone Encounter (Signed)
It is a low dose, we don't call a medication a failure unless she is on a good dose for at least 2 months. If no side effects, increase nortriptyline : take 2 caps qhs. Pls send in updated Rx, thanks

## 2023-02-07 NOTE — Telephone Encounter (Signed)
It is a low dose, we don't call a medication a failure unless she is on a good dose for at least 2 months. If no side effects, increase nortriptyline 25mg: take 2 caps qhs. Pls send in updated Rx, thanks 

## 2023-02-07 NOTE — Telephone Encounter (Signed)
Called and spoke to patient and asked her if she has been taking her Nortriptyline at bedtime along with her Gabapentin 300 mg TID? Patient stated yes she has been talking it but it has not been helping her and states she cannot sleep due to pain. Patient stated that she was once put on Amitriptyline that worked in the past and then stopped working for her. Patient wants to know if there is anything she can do for this? I did let patient know that it usually takes a few weeks for medication to start working.

## 2023-02-07 NOTE — Telephone Encounter (Signed)
Pt called in stating her neuropathy medication is not working. She says she hasn't been sleeping due to the pain.

## 2023-02-08 MED ORDER — NORTRIPTYLINE HCL 25 MG PO CAPS: ORAL_CAPSULE | ORAL | 0 refills | Status: DC

## 2023-02-08 NOTE — Telephone Encounter (Signed)
Called patient and informed her of Dr. Rosalyn Gess advice and reccomendations that It is a low dose, we don't call a medication a failure unless she is on a good dose for at least 2 months. If no side effects, increase nortriptyline : take 2 caps qhs.     Patient stated that she would like to try the increase and will do anything at this point to see if it helps. Patient aware I will send updated rx in for her. Patient had no further questions or concerns.

## 2023-02-08 NOTE — Telephone Encounter (Signed)
Pt called requesting a call back regarding her Rx. States she would like her Rx to be sent to the pharmacy today, she is in pain.

## 2023-02-14 ENCOUNTER — Ambulatory Visit (HOSPITAL_COMMUNITY): Admission: RE | Admit: 2023-02-14 | Payer: Medicaid Other | Source: Ambulatory Visit

## 2023-02-15 ENCOUNTER — Telehealth: Payer: Self-pay | Admitting: Anesthesiology

## 2023-02-15 NOTE — Telephone Encounter (Signed)
Pt called stating she has been having nausea x 2 weeks. States she would like for Dr Karel Jarvis to give her a Rx for phenergan.

## 2023-02-16 NOTE — Telephone Encounter (Signed)
Called patient to give your recommendations and patient had more information for me. Patient called PCP who denied filling medication and told patient to call [facility] since they see her for her begin brain tumor. They scheduled her next month but refuse to give her any meds until then they recommended she call Dr. Karel Jarvis and patient also tried over the counter meds for nausea that did not work and she is really having nausea and wanting a prescription until she can see the doctor at [facility]

## 2023-02-16 NOTE — Telephone Encounter (Signed)
I have been trying to call PCP was on hold for 30 min with no answer called [facility] Dr. Maisie Fus has a CMA and I am awaiting that call back

## 2023-02-16 NOTE — Telephone Encounter (Signed)
Pls have her call PCP for nausea, thanks

## 2023-02-16 NOTE — Telephone Encounter (Signed)
Pls request notes from PCP. I have no information on why she will be seeing Neurosurgery and why they need to prescribe nausea medication as well. Pls request for most recent imaging on why she is seeing Neurosurg. Thanks

## 2023-02-20 ENCOUNTER — Ambulatory Visit: Payer: Medicaid Other | Admitting: Internal Medicine

## 2023-02-23 ENCOUNTER — Telehealth: Payer: Self-pay | Admitting: Neurology

## 2023-02-23 MED ORDER — NORTRIPTYLINE HCL 25 MG PO CAPS: ORAL_CAPSULE | ORAL | 1 refills | Status: DC

## 2023-02-23 NOTE — Telephone Encounter (Signed)
We don't call a medication a failure until she is on a good dose for 2 months. There is still a lot of room to increase the nortriptyline as long as no side effects. If she agrees, increase nortriptyline 25mg : Take 3 caps qhs (75mg  qhs). Pls let her know some patients are on 100mg  or 150mg . Thanks

## 2023-02-23 NOTE — Addendum Note (Signed)
Addended by: Dimas Chyle on: 02/23/2023 10:25 AM   Modules accepted: Orders

## 2023-02-23 NOTE — Telephone Encounter (Signed)
Pt states that the nortriptyline is not working any longer. She is in a lot of pain and wants to know what she can do or if there is a different medication that she can try. Please call patient   She uses [facility]

## 2023-02-23 NOTE — Telephone Encounter (Addendum)
Pt called an advised that We don't call a medication a failure until she is on a good dose for 2 months. There is still a lot of room to increase the nortriptyline as long as no side effects. If she agrees, increase nortriptyline 25mg : Take 3 caps qhs (75mg  qhs). Pls let her know some patients are on 100mg  or 150mg  pt is ok with the medication increase new RX sent to [facility]

## 2023-02-27 ENCOUNTER — Ambulatory Visit: Payer: Medicaid Other | Admitting: Neurology

## 2023-03-04 ENCOUNTER — Telehealth: Payer: Self-pay | Admitting: Emergency Medicine

## 2023-03-04 MED ORDER — AZITHROMYCIN 250 MG PO TABS: ORAL_TABLET | ORAL | 0 refills | Status: DC

## 2023-03-04 MED ORDER — PREDNISONE 20 MG PO TABS: 20.0000 mg | ORAL_TABLET | Freq: Every day | ORAL | 0 refills | Status: DC

## 2023-03-04 NOTE — Telephone Encounter (Signed)
URI sx for about 3-4 days, persistent cough, no wheeze, no sputum. Some SOB especially at night.   Will call in short course prednisone, azithro

## 2023-03-06 ENCOUNTER — Telehealth: Payer: Self-pay | Admitting: Neurology

## 2023-03-06 ENCOUNTER — Encounter: Payer: Self-pay | Admitting: Neurology

## 2023-03-06 ENCOUNTER — Ambulatory Visit (INDEPENDENT_AMBULATORY_CARE_PROVIDER_SITE_OTHER): Payer: Medicaid Other | Admitting: Neurology

## 2023-03-06 VITALS — BP 123/55 | HR 106 | Ht 63.0 in | Wt 211.8 lb

## 2023-03-06 DIAGNOSIS — G2581 Restless legs syndrome: Secondary | ICD-10-CM

## 2023-03-06 DIAGNOSIS — R252 Cramp and spasm: Secondary | ICD-10-CM

## 2023-03-06 MED ORDER — PRAMIPEXOLE DIHYDROCHLORIDE 1 MG PO TABS: ORAL_TABLET | ORAL | 11 refills | Status: DC

## 2023-03-06 MED ORDER — GABAPENTIN 300 MG PO CAPS: 300.0000 mg | ORAL_CAPSULE | Freq: Three times a day (TID) | ORAL | 6 refills | Status: DC

## 2023-03-06 MED ORDER — CLONAZEPAM 1 MG PO TABS
ORAL_TABLET | ORAL | 5 refills | Status: DC
Start: 2023-03-06 — End: 2023-03-31

## 2023-03-06 NOTE — Patient Instructions (Addendum)
Good to see you.  Have bloodwork done for iron studies  2. Reduce Pramipexole (Mirapex) 1mg : take 1 tablet 2 hours before bedtime (stop the 1/2 tablet in the morning)  3. Stop the morning dose of clonazepam. Take clonazepam 1mg  every night  4. Continue Gabapentin 300mg  three times a day and nortriptyline 25mg : take 3 capsules every night  5. Please discuss neuropathic pain with your Pain Management specialist  6. Follow-up in 4 months, call for any changes
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
## 2023-03-06 NOTE — Telephone Encounter (Signed)
New message    Patient is no longer a patient at [facility].

## 2023-03-16 ENCOUNTER — Telehealth: Payer: Self-pay | Admitting: Neurology

## 2023-03-16 NOTE — Telephone Encounter (Signed)
Noted, will defer treatment of pain to her Pain specialist, thanks

## 2023-03-16 NOTE — Telephone Encounter (Signed)
Pt called in to update Dr. Karel Jarvis on her pain doctor and the nortriptyline. Her pain doctor said to keep her on it, but changed it to 2 in the morning 2 at bedtime.

## 2023-03-17 ENCOUNTER — Telehealth: Payer: Self-pay | Admitting: *Deleted

## 2023-03-17 NOTE — Telephone Encounter (Signed)
Patient called and states she received prednisone and azithro prescription and states she's not feeling better. Patient wanted to know if she could get something for a cough.   Please call and advise patient. [phone]

## 2023-03-17 NOTE — Telephone Encounter (Signed)
Called and spoke w/ pt she verbalized that she is not feeling any better after pred ad z-pak prescribed by RB - she is hoarse and coughing. She has been taking robitussin and mucinex.   She is requesting a medication for cough and other recommendations from [facility].   [facility] to [facility] in [place] on [address]

## 2023-03-18 NOTE — Telephone Encounter (Signed)
Mucinex dm 1200 mg bid is best she can do s ov with all meds in hand if she needs something stronger at this point as  I have not seen her in person for > 6 m

## 2023-03-20 NOTE — Telephone Encounter (Signed)
Called and spoke w/ pt she verbalized that yesterday she tested 2 at home test and she was positive for COVID - she verbalized that she is going to [facility] tomorrow for a f/u. Instructed pt to give [place] a call tomorrow and let us know what the UC provider says & we can schedule acute visit.

## 2023-03-21 NOTE — Telephone Encounter (Signed)
Patient calling back to speak about results from Urgent Care. Patient states she tested negative for COVID at [facility], but was Dx with an upper respiratory infection. Patient was prescribed prednisone and an antibiotic.  Please call and advise patient. [phone]

## 2023-03-21 NOTE — Telephone Encounter (Signed)
Pt went to UC and was dx w/ URI no COVID - provided Augmentin and prednisone. She is schedule to see BW on 6/12. NFN att.

## 2023-03-22 ENCOUNTER — Other Ambulatory Visit: Payer: Self-pay | Admitting: Neurology

## 2023-03-22 NOTE — Telephone Encounter (Signed)
Pt needs a refill on the Klonopin 1 mg  to the [facility]  in [place]

## 2023-03-22 NOTE — Telephone Encounter (Signed)
Clonazepam was just sent 2 weeks ago to other pharmacy. Will send Rx to [facility] when closer to time for pickup ([facility]). Thanks

## 2023-03-22 NOTE — Telephone Encounter (Signed)
She also talked to the  pain Dr about medication  Nortiptyline they changed it from 4 to pills a day  2 in the morning and 2 at night

## 2023-03-23 ENCOUNTER — Ambulatory Visit: Payer: Medicaid Other | Admitting: Gastroenterology

## 2023-03-24 ENCOUNTER — Encounter (HOSPITAL_COMMUNITY): Payer: Medicaid Other

## 2023-03-28 ENCOUNTER — Encounter (HOSPITAL_COMMUNITY): Payer: Medicaid Other

## 2023-03-29 ENCOUNTER — Ambulatory Visit: Payer: Medicaid Other | Admitting: Primary Care

## 2023-03-31 ENCOUNTER — Telehealth: Payer: Self-pay | Admitting: Neurology

## 2023-03-31 ENCOUNTER — Other Ambulatory Visit: Payer: Self-pay

## 2023-03-31 ENCOUNTER — Telehealth: Payer: Self-pay | Admitting: *Deleted

## 2023-03-31 NOTE — Telephone Encounter (Signed)
Patient would like a refill of pepcid sent to [facility]

## 2023-03-31 NOTE — Telephone Encounter (Signed)
New message   1. Which medications need to be refilled? (please list name of each medication and dose if known) clonazePAM (KLONOPIN) 1 MG tablet   2. Which pharmacy/location (including street and city if local pharmacy) is medication to be sent to?[facility] [address], [place] - [address]   3. Do they need a 30 day or 90 day supply? 30 day supply

## 2023-03-31 NOTE — Telephone Encounter (Signed)
Patient called to request refill on Clonazepam. Per chart rx was sent [facility]/06/23, [facility]. Patient states she now uses [facility].

## 2023-03-31 NOTE — Telephone Encounter (Signed)
Send in today

## 2023-04-03 ENCOUNTER — Ambulatory Visit: Payer: Medicaid Other | Admitting: Internal Medicine

## 2023-04-03 MED ORDER — FAMOTIDINE 20 MG PO TABS: 20.0000 mg | ORAL_TABLET | Freq: Every day | ORAL | 2 refills | Status: DC

## 2023-04-03 MED ORDER — CLONAZEPAM 1 MG PO TABS: ORAL_TABLET | ORAL | 5 refills | Status: DC

## 2023-04-03 NOTE — Telephone Encounter (Signed)
Called and spoke w/ pt let her know that I have sent in order to the preferred pharmacy. NFN att.

## 2023-04-05 ENCOUNTER — Ambulatory Visit (HOSPITAL_BASED_OUTPATIENT_CLINIC_OR_DEPARTMENT_OTHER): Payer: Medicaid Other | Admitting: Pulmonary Disease

## 2023-04-06 ENCOUNTER — Telehealth: Payer: Self-pay | Admitting: *Deleted

## 2023-04-06 NOTE — Telephone Encounter (Signed)
Patient states that she is still having symptoms of "a respiratory infection" and has been taking robitussin for cough/cold symptoms  Patient would like to get something that could help. Patient cannot use tesslon pearls due to not being covered under insurance //  Please call and advise patient. Patient uses [facility]

## 2023-04-06 NOTE — Telephone Encounter (Signed)
Called and spoke w/ pt she verbalized that she is still having a cough, I offered pt next available appt on 6/28 - she accepted scheduled her for 2:15   NFN att.

## 2023-04-07 ENCOUNTER — Telehealth: Payer: Self-pay | Admitting: *Deleted

## 2023-04-07 NOTE — Telephone Encounter (Signed)
Patient would like a prescription of Symbacort and a prescription of Ventolin sent over to [facility].

## 2023-04-10 ENCOUNTER — Telehealth: Payer: Self-pay | Admitting: Neurology

## 2023-04-10 MED ORDER — SYMBICORT 80-4.5 MCG/ACT IN AERO: INHALATION_SPRAY | RESPIRATORY_TRACT | 11 refills | Status: DC

## 2023-04-10 MED ORDER — RAMELTEON 8 MG PO TABS: 8.0000 mg | ORAL_TABLET | Freq: Every day | ORAL | 5 refills | Status: DC

## 2023-04-10 MED ORDER — ALBUTEROL SULFATE HFA 108 (90 BASE) MCG/ACT IN AERS: 2.0000 | INHALATION_SPRAY | RESPIRATORY_TRACT | 1 refills | Status: AC | PRN

## 2023-04-10 NOTE — Telephone Encounter (Signed)
I sent script for ramelteon (rozerem) 8 mg nightly

## 2023-04-10 NOTE — Telephone Encounter (Signed)
Patient called and she stated that her Klonopin was changed to a 1 mg tablet at bedtime. She stated that it is not working and she is jerking a lot during the day. Patient stated that the 0.5 mg two tablets a day worked better for her. She wanted to know if she could go back on the 0.5 mg tablets because she stated they work way better for her.   Patient wanted to let Dr. Karel Jarvis know she has her lung test on 04/14/23.  Patient aware I will send a message to Dr. Karel Jarvis and give her a call back.

## 2023-04-10 NOTE — Telephone Encounter (Signed)
Called patient and left a message for a call back.  

## 2023-04-10 NOTE — Telephone Encounter (Signed)
Patient lvm requesting phone call back/KB

## 2023-04-10 NOTE — Telephone Encounter (Signed)
Spoke with the pt- I refilled her inahlers- albuterol and symbicort  She is asking for a refill on her rozerem 8 mg  Please advise thanks

## 2023-04-11 NOTE — Telephone Encounter (Signed)
Pls let her know [place] has a database for restricted medications, it says she just filled the 1mg  last week (6/17), we cannot send another prescription until it is closer to fill date. Will send the 0.5mg  in July. Thanks

## 2023-04-12 ENCOUNTER — Other Ambulatory Visit (HOSPITAL_COMMUNITY): Payer: Self-pay

## 2023-04-12 NOTE — Telephone Encounter (Signed)
Returned call to patient.   Patient stated she has tried OTC sleep aids and other prescriptions with no help.  Patient stated the only prescription she has had in the past was generic Seroquel, but patient was taken off of it because of other meds.  Patient stated she wanted to try something to help her sleep and requested a generic for her insurance to cover.   Message routed to Dr. Craige Cotta to advise

## 2023-04-12 NOTE — Telephone Encounter (Signed)
Pt will like another call back

## 2023-04-12 NOTE — Telephone Encounter (Signed)
Can you please check what other sleep aide medications are listed on her medication formulary?

## 2023-04-12 NOTE — Telephone Encounter (Signed)
Called and spoke with patient. Patient stated she was told prescription was ready, but she can not afford the co pay for ramelteon (rozerem) 8 mg.  Patient requested a cheaper medication. I checked [facility] and [facility], but patient stated that was not in her budget at this time.   Will route message to pharmacy team to advise

## 2023-04-12 NOTE — Telephone Encounter (Signed)
Pt was given the message from Dr. Karel Jarvis and pt stated that she was not aware but just wanted to let Dr. Karel Jarvis know that the 0.5 MG works better for her.  Pt stated that she will wait until she is able to get her refill in July to get it changed back.  Pt thanked the office for their understanding of this matter.

## 2023-04-12 NOTE — Telephone Encounter (Signed)
Message routed to pharmacy team to advise 

## 2023-04-12 NOTE — Telephone Encounter (Signed)
Pt requesting a call back. 

## 2023-04-13 NOTE — Telephone Encounter (Signed)
Pt will like another call back, Dr. Sherene Sires has her on Symbicort and her ins isn't paying for it and its like $400, pls advise

## 2023-04-13 NOTE — Telephone Encounter (Signed)
Patient calling to get a return call back. Patient states Symbicort is not covered under insurance. Patient has rov scheduled for 04/21/23 with [facility].   Patient also states she would like something for sleep and has not been able to sleep for more than 2 hours at a time.   Please call and advise patient.

## 2023-04-14 ENCOUNTER — Ambulatory Visit: Payer: Medicaid Other | Admitting: Internal Medicine

## 2023-04-14 ENCOUNTER — Telehealth: Payer: Self-pay | Admitting: Neurology

## 2023-04-14 ENCOUNTER — Telehealth: Payer: Self-pay | Admitting: Pulmonary Disease

## 2023-04-14 ENCOUNTER — Ambulatory Visit (HOSPITAL_COMMUNITY): Payer: Medicaid Other

## 2023-04-14 NOTE — Telephone Encounter (Signed)
Pt is calling in to see if Dr. Karel Jarvis would change her Rx pramipexole (MIRAPEX) 1 MG to 1/2 in the morning and whole one at night due to her having pain in her legs.  Pt would like to have a call back.

## 2023-04-14 NOTE — Telephone Encounter (Signed)
Routing once again to PA team for review of pt sleep medication options and also for further review of symbicort high copay

## 2023-04-14 NOTE — Telephone Encounter (Signed)
Routing to Dr. Craige Cotta in [facility]. He is her sleep Dr. This pertains to her slepp meds.   PT states she spoke to a Misty Stanley how was going to check w/Dr. Craige Cotta about her sleep medication (poss 8 mg tablet starting with an R) to see about getting a less expensive option. Pls call @ [phone]

## 2023-04-14 NOTE — Telephone Encounter (Signed)
disregard

## 2023-04-17 NOTE — Telephone Encounter (Signed)
She is actually on twice the recommended dose of Pramipexole. Usually we should not go above 0.5mg  but she is on 1mg . High doses can actually worsen restless leg symptoms. For pain, please have her contact her pain specialist. Thanks

## 2023-04-18 ENCOUNTER — Encounter: Payer: Self-pay | Admitting: Gastroenterology

## 2023-04-18 ENCOUNTER — Ambulatory Visit: Payer: Medicaid Other | Admitting: Gastroenterology

## 2023-04-18 NOTE — Telephone Encounter (Signed)
Pt called an informed that She is actually on twice the recommended dose of Pramipexole. Usually we should not go above 0.5mg  but she is on 1mg . High doses can actually worsen restless leg symptoms. For pain, please have her contact her pain specialist. She said she has the fidgets she is going to talk to the pain doctor,

## 2023-04-19 NOTE — Telephone Encounter (Signed)
VS, please advise.  

## 2023-04-21 ENCOUNTER — Other Ambulatory Visit (HOSPITAL_COMMUNITY): Payer: Self-pay

## 2023-04-21 ENCOUNTER — Ambulatory Visit: Payer: Medicaid Other | Admitting: Internal Medicine

## 2023-04-21 ENCOUNTER — Encounter: Payer: Self-pay | Admitting: Internal Medicine

## 2023-04-21 NOTE — Telephone Encounter (Signed)
There is a phone note from 04/07/23.  Pharmacy team was to check if there is a more affordable sleep aide medication that is on her insurance formulary, but I haven't heard back about this yet.  Can you please follow up with pharmacy team.

## 2023-04-21 NOTE — Telephone Encounter (Signed)
Please ask her to check with her insurance provider to get a list of approved sleep aide medications on her insurance plan formulary.  After reviewing this, we can the determine if there is an alternative medication that might be more affordable.

## 2023-04-21 NOTE — Progress Notes (Deleted)
Beverly Tran, female    DOB: 07/03/1971     MRN: [id number]   Brief patient profile:  51 yowf quit smoking 01/2020  "born with bronchitis"  (mother smoked)  On "breathing pill they quit making" then around age 52 changed to  inhalers as needed  and ever since and did fine s maint rx   until April 2021 with onset of cough refractory to all rx including  x for hydocodone so referred to pulmonary clinic 04/14/2020 by Dr   Sherryll Burger      History of Present Illness  04/14/2020  Pulmonary/ 1st office eval/  S/p 2nd of moderna 04/02/20  Chief Complaint  Patient presents with   Pulmonary Consult    Referred by Dr Sherryll Burger. Pt c/o cough x 2 months. Cough is non prod and worse at night. She has found the only thing that has helped is hydrocodone cough syrup.   Dyspnea:  Only problem is heat - does fine in a/c including housework  Cough:worse at hs / dry sometimes choking  Sleep: bed is flat 2 pillows  SABA use: not helping cough  maint now on singulair / not using symbicort// already on gabapentin 300 tid and lyrica rec Valsartan 160-12.5 one daily instead of lotensin Add pepcid 20 mg one after supper  until return  GERD diet/ bedblocks For cough > hydrocodone up to a tsp every 4 hours as needed but should be better w/in 2 weeks Please schedule a follow up office visit in 6 weeks, call sooner if needed with all medications /inhalers/ solutions in hand so we can verify exactly what you are taking. This includes all medications from all doctors and over the counters    11/25/2020  f/u ov/[place] office/ re:  Def doing much  better p last ov until 1st of 2022 eval by Sherryll Burger NP with nasal congestion / clear mucus / did not bring meds as requested Chief Complaint  Patient presents with   Follow-up    Nasal congestion, non productive cough  Dyspnea: was doing fine until onset flare 1s of the year  Cough: worse when puts head down/ noct cough and in am  Sleeping: bed is flat/ 2 pillows  SABA  use: hfa not helping/ neb helps  more  02: 2lpm hs and  Prn daytime Covid status:2nd pfizer 09/08/20  Lung cancer screening: does not meet age criteria rec Discuss stopping the inderal with your neurologist  Pantoprazole (protonix) 40 mg   Take  30-60 min before first meal of the day and Pepcid (famotidine)  20 mg one after supper until return to office - this is the best way to tell whether stomach acid is contributing to your problem.   GERD Augmentin 875 mg take one pill twice daily  X 10 days - take at breakfast and supper with large glass of water.  It would help reduce the usual side effects (diarrhea and yeast infections) if you ate cultured yogurt at lunch.  Prednisone 10 mg take  4 each am x 2 days,   2 each am x 2 days,  1 each am x 2 days and stop  For cough > hydrocodone up to a tsp every 4 hours as needed but should be better w/in 2 weeks Please remember to go to the lab department  > did not go  Please schedule a follow up office visit in 6 weeks, call sooner if needed with pfts on return > did not do  Admit date:     02/24/2022  Discharge date: 03/04/22  Discharge Physician: York Ram Arrien        Recommendations at discharge:     Continue antibiotic therapy with linezolid to complete 4 weeks from 02/26/22, until 03/26/22. Hold on diuretic therapy Continue supplemental 02 per [place] at home.  Follow up as outpatient in 7 to 10 days with primary care.  Follow up with pulmonary to repeat chest imaging.    Discharge Diagnoses: Active Problems:   Empyema (HCC)   COPD (chronic obstructive pulmonary disease) (HCC)   Type 2 diabetes mellitus with hyperlipidemia (HCC)   Essential hypertension   Gastroesophageal reflux disease without esophagitis   Anxiety   Hypokalemia   Iron deficiency anemia   Class 2 obesity     Hospital Course: Beverly Tran was admitted to the hospital with the working diagnosis of sepsis due to pneumonia, complicated with empyema.     52 yo female with the past medical history of COPD, T2DM, hypertension and obesity class 2 who presented with dyspnea. Initially admitted to community acquired pneumonia on 02/16/22, she had high oxygen requirements, and required non invasive mechanical ventilation. Her blood cultures were positive for MRSA. On 02/23/22 she left the hospital against medical advice. She return the following day with worsening dyspnea. At home 02 saturation was 77% on 6 L/min of supplemental 02 per [place]. She was in respiratory distress and placed back on Bipap, her blood pressure was 119/100, HR 101, RR 22, temp 101.3 and 02 saturation 86%, lungs with no wheezing, positive rales at the rifht side with decreased breath sounds, heart with S1 and S2 present and rhythmic, abdomen not distended and positive lower extremity edema.    Chest radiograph with right pleural effusion and bilateral lower lobes interstitial infiltrates.  CT chest with no pulmonary embolism, worsening multifocal pneumonia with ground glass opacities of the left lower lobe and signs of necrotic pneumonia on the right lower lobe. Moderate loculated right pleural effusion.  Small left pleural effusion.    Patient was placed on broad spectrum antibiotic and pulmonary was consulted.  She was transferred [facility] to [facility], for further management.  Chest tube was placed to the right pleural effusion and received fibrinolytic therapy with good toleration. 05/15 chest tube was removed.    Further work up with TEE was negative for vegetations, and antibiotic therapy was changed from IV vancomycin to oral linezolid to continue until 03/26/22, to complete 4 weeks from 02/26/22.    Oxygen requirements have improved, and patient will follow up as outpatient.          Assessment and Plan: Empyema (HCC) Sepsis complicated with MRSA bacteremia/ severe sepsis end organ damage hypoxemic respiratory failure. (present on admission).    Patient had a prolonged  hospitalization, she was placed on broad spectrum antibiotic therapy.  Transferred from [facility] to [facility] for further management.    05/11 right thoracentesis too organized to drain.    05//13/23 chest tune was placed, 14 french pigtail catheter and connected to suction -20 cm H20.  05/14 started with pleural lytics.    Cultures were no growth, but she had positive MRSA bacteremia in [facility], 2 weeks prior while being treated for pneumonia (she left AMA on that occasion).    Antibiotic therapy was narrowed to IV vancomycin Patient with high risk for creating a bronchopleural fistula and not candidate for decortications.    TEE negative for vegetations. Plan to transition to linezolid to  complete therapy on 03/26/22.    Acute hypoxemic respiratory failure, that has been improving.  At home patient will continue using supplemental 02 per [place] and instructed to follow up as outpatient.       COPD (chronic obstructive pulmonary disease) (HCC) No clinical signs of exacerbation Continue bronchodilator therapy and oxymetry monitoring.    Type 2 diabetes mellitus with hyperlipidemia (HCC) Her glucose remained stable, she was placed on insulin sliding scale during her hospitalization with good toleration.  At the time of her discharge she will resume metformin, and pioglitazone.    Essential hypertension Blood pressure has been stable. At the time of her discharge she will resume irbesartan, propanolol and amlodipine.   Echocardiogram with preserved LV systolic function, will hold on furosemide for now, instructions for close follow up as outpatient.    Gastroesophageal reflux disease without esophagitis Continue with antiacid therapy.    Anxiety Continue with clonidine 0.5 bid    Hypokalemia Electrolytes were corrected, at the time of her discharge her renal function is stable with serum cr at 0.41, K is 3,9 and serum bicarbonate at 32.    Plan to hold on diuretic therapy and follow  up renal function as outpatient.    Iron deficiency anemia Continue with iron supplementation    Class 2 obesity Calculated BMI is 36.4    04/21/2022  post hosp f/u ov/[place] office/ re: AB vs UACS  maint on symbicort 80 but not using  and on propranolol 10 mg daily  Chief Complaint  Patient presents with   Hospitalization Follow-up    Feels breathing is doing better since being in the hospital.   Is using 5LO2 pulse   Dyspnea:  "right much walking" inside house only for now/ came to office in w/c Cough: minmal mucoid  Sleeping: bed is flat Left side/ 2 pillow SABA use: neb qid  02: 5lpm hs  24/7  Rec Try off the propranolol to see what diffence if any it makes  Plan A = Automatic = Always=    symbicort 80 Take 2 puffs first thing in am and then another 2 puffs about 12 hours later.   Work on inhaler technique:  - practice with the empty inhaler I gave you today  Plan B = Backup (to supplement plan A, not to replace it) Only use your albuterol inhaler as a rescue medication Plan C = Crisis (instead of Plan B but only if Plan B stops working) - only use your albuterol nebulizer if you first try Plan B and it fails to help > ok to use the nebulizer up to every 4 hours but if start needing it regularly call for immediate appointment Make sure you check your oxygen saturation  AT  your highest level of activity (not after you stop)   to be sure it stays over 90%       06/28/2022  f/u ov/[place] office/ re: AB maint on symbicort   Chief Complaint  Patient presents with   Follow-up    She is doing well. She had her CXR.   Dyspnea:  walked across street for cxr s 02  Cough: minimal / some hs  Sleeping: flat bed/ L side  SABA use: none 02: 3lpm hs  Covid status: vax x 4  Rec Bed blockers (risers) 6-8 inches under head board Prednisone 10 mg take  4 each am x 2 days,   2 each am x 2 days,  1 each am x 2 days and  stop  Work on inhaler technique: For drainage /  throat tickle at bed time stop xyzal  try take CHLORPHENIRAMINE  4 mg    04/21/2023  f/u ov/[place] office/ re: *** maint on ***  No chief complaint on file.   Dyspnea:  *** Cough: *** Sleeping: *** SABA use: *** 02: *** Covid status: *** Lung cancer screening: ***   No obvious day to day or daytime variability or assoc excess/ purulent sputum or mucus plugs or hemoptysis or cp or chest tightness, subjective wheeze or overt sinus or hb symptoms.   *** without nocturnal  or early am exacerbation  of respiratory  c/o's or need for noct saba. Also denies any obvious fluctuation of symptoms with weather or environmental changes or other aggravating or alleviating factors except as outlined above   No unusual exposure hx or h/o childhood pna/ asthma or knowledge of premature birth.  Current Allergies, Complete Past Medical History, Past Surgical History, Family History, and Social History were reviewed in [employer] record.  ROS  The following are not active complaints unless bolded Hoarseness, sore throat, dysphagia, dental problems, itching, sneezing,  nasal congestion or discharge of excess mucus or purulent secretions, ear ache,   fever, chills, sweats, unintended wt loss or wt gain, classically pleuritic or exertional cp,  orthopnea pnd or arm/hand swelling  or leg swelling, presyncope, palpitations, abdominal pain, anorexia, nausea, vomiting, diarrhea  or change in bowel habits or change in bladder habits, change in stools or change in urine, dysuria, hematuria,  rash, arthralgias, visual complaints, headache, numbness, weakness or ataxia or problems with walking or coordination,  change in mood or  memory.        No outpatient medications have been marked as taking for the 04/21/23 encounter (Appointment) with Nyoka Cowden, MD.               Past Medical History:  Diagnosis Date   Anxiety    Asthma    Back pain, chronic    Depression    High  cholesterol    Hypertension    Migraine          Objective:    wts   04/21/2023          ***  06/28/2022       216  04/21/2022         unable to stand   03/18/2021         219   11/25/20 213 lb 6.4 oz (96.8 kg)  04/14/20 210 lb (95.3 kg)  10/12/16 190 lb (86.2 kg)    Vital signs reviewed  04/21/2023  - Note at rest 02 sats  ***% on ***   General appearance:    ***                Assessment

## 2023-04-21 NOTE — Telephone Encounter (Signed)
Called the pt and there was no answer- LMTCB    

## 2023-04-25 NOTE — Telephone Encounter (Signed)
Please try again so I can close encounter.

## 2023-04-28 ENCOUNTER — Other Ambulatory Visit: Payer: Self-pay | Admitting: Neurology

## 2023-04-28 MED ORDER — CLONAZEPAM 0.5 MG PO TABS: ORAL_TABLET | ORAL | 5 refills | Status: DC

## 2023-04-28 NOTE — Telephone Encounter (Signed)
Pt needs to see if we will call in the klonopin .5 mg to [facility] in [place]

## 2023-05-02 NOTE — Telephone Encounter (Signed)
Called the pt and there was no answer- unable to leave msg this time  Closing per protocol

## 2023-05-09 ENCOUNTER — Ambulatory Visit (HOSPITAL_BASED_OUTPATIENT_CLINIC_OR_DEPARTMENT_OTHER): Payer: Medicaid Other | Admitting: Pulmonary Disease

## 2023-05-30 ENCOUNTER — Telehealth: Payer: Self-pay | Admitting: Neurology

## 2023-05-30 NOTE — Telephone Encounter (Signed)
Please disregard this message °

## 2023-05-30 NOTE — Telephone Encounter (Signed)
Patient is calling about medication change and is wanting a call back from the nurse

## 2023-05-31 ENCOUNTER — Telehealth: Payer: Self-pay | Admitting: Internal Medicine

## 2023-05-31 ENCOUNTER — Ambulatory Visit: Payer: Medicaid Other | Admitting: Pulmonary Disease

## 2023-05-31 NOTE — Telephone Encounter (Signed)
Former patient of Dr.Sood. Calling to see if she can get a refill ambien until she is able to see Tammy Parrett for her follow up appointment. She can be reached at [phone]

## 2023-05-31 NOTE — Telephone Encounter (Signed)
Erroneous encounter

## 2023-06-02 NOTE — Telephone Encounter (Signed)
Patient has been DISMISSED from this office, effective 04/21/23. She is not allowed to be scheduled her for any further appointments (and should not have been scheduled).  Refill request denied. She can contact PCP.

## 2023-06-02 NOTE — Telephone Encounter (Signed)
Can you start the dismissal process as the front cannot?

## 2023-06-02 NOTE — Telephone Encounter (Signed)
Letter has already been sent to patient. See chart.

## 2023-06-05 ENCOUNTER — Telehealth: Payer: Self-pay | Admitting: Neurology

## 2023-06-05 NOTE — Telephone Encounter (Signed)
Called patient and she is stating she isnt having pain. She feels she can not be still at night. Patient asking about if she is able to split her pills in half and take the half pills over a longer period of time

## 2023-06-05 NOTE — Telephone Encounter (Signed)
Pt is calling in stating that she need to see if Dr. Karel Jarvis would increase her Rx pramipexole (MIRAPEX) 1 MG to two tablets at bed time.  PharmHaematologist in [place], [place]

## 2023-06-05 NOTE — Telephone Encounter (Signed)
Pls let her know that she is actually on double the recommended dose, and that Restless Leg syndrome actually worsens if they are on too much medication. If it is pain, she has to f/u with her Pain doctor, thanks

## 2023-06-08 MED ORDER — PRAMIPEXOLE DIHYDROCHLORIDE 1 MG PO TABS: ORAL_TABLET | ORAL | Status: DC

## 2023-06-08 NOTE — Telephone Encounter (Signed)
Pt called an informed that Dr Karel Jarvis would like for her to try taking the pramipexole 1/2 tablet 2 hours before bedtime, then 1/2 tablet at bedtime. Pt verbalized understanding

## 2023-06-08 NOTE — Addendum Note (Signed)
Addended by: Dimas Chyle on: 06/08/2023 01:49 PM   Modules accepted: Orders

## 2023-06-08 NOTE — Telephone Encounter (Signed)
She can try taking the pramipexole 1/2 tablet 2 hours before bedtime, then 1/2 tablet at bedtime. Thanks

## 2023-06-21 ENCOUNTER — Other Ambulatory Visit: Payer: Self-pay | Admitting: Neurology

## 2023-06-21 NOTE — Telephone Encounter (Signed)
Pt needs needs  new Rx sent to [facility] pharmacy  For the Klonopin 0.5  she does not want to use [facility]

## 2023-06-27 MED ORDER — CLONAZEPAM 0.5 MG PO TABS: ORAL_TABLET | ORAL | 5 refills | Status: DC

## 2023-07-04 ENCOUNTER — Ambulatory Visit: Payer: Medicaid Other | Admitting: Nurse Practitioner

## 2023-07-10 ENCOUNTER — Encounter: Payer: Self-pay | Admitting: Neurology

## 2023-07-10 ENCOUNTER — Ambulatory Visit: Payer: Medicaid Other | Admitting: Neurology

## 2023-08-01 ENCOUNTER — Other Ambulatory Visit: Payer: Self-pay | Admitting: Internal Medicine

## 2023-08-16 ENCOUNTER — Other Ambulatory Visit: Payer: Self-pay | Admitting: Internal Medicine

## 2023-08-17 ENCOUNTER — Telehealth: Payer: Self-pay | Admitting: Neurology

## 2023-08-17 MED ORDER — GABAPENTIN 300 MG PO CAPS: 300.0000 mg | ORAL_CAPSULE | Freq: Three times a day (TID) | ORAL | 5 refills | Status: DC

## 2023-08-17 NOTE — Telephone Encounter (Signed)
 Refill sent in to [facility].

## 2023-08-17 NOTE — Telephone Encounter (Signed)
1. Which medications need to be refilled? (please list name of each medication and dose if known) gabapentin (NEURONTIN) 300 MG capsule [[id number]]   2. Which pharmacy/location (including street and city if local pharmacy) is medication to be sent to? [facility] [place]  3. Do they need a 30 day or 90 day supply?

## 2023-08-21 ENCOUNTER — Telehealth: Payer: Self-pay

## 2023-08-21 NOTE — Telephone Encounter (Signed)
Pt called an informed that Dr Karel Jarvis got a note from [facility] that she has been receiving Gabapentin from both our office and her pain specialist, she got 2 prescriptions for Gabapentin in October. This needs to come from only one physician. Moving forward, all Gabapentin and pain meds need to be from her pain specialist. Our office call the pharmacy and DC all RX for Gabapentin for Beverly Tran pt is aware.

## 2023-09-01 ENCOUNTER — Encounter: Payer: Self-pay | Admitting: Neurology

## 2023-09-07 ENCOUNTER — Telehealth: Payer: Self-pay | Admitting: Neurology

## 2023-09-07 NOTE — Telephone Encounter (Signed)
Pt wanting a sooner appointment for refills, pt advised that we will refill her medication until her appointment, pt stated that she will see [place] in February

## 2023-09-07 NOTE — Telephone Encounter (Signed)
Left message with the after hour service on 09-07-23 at 12:45 pm   Needs to speak to [facility]  about her leg weakness. Pt has appt in Feb and is on the wait list

## 2023-09-28 ENCOUNTER — Telehealth: Payer: Self-pay

## 2023-09-28 MED ORDER — NORTRIPTYLINE HCL 25 MG PO CAPS: ORAL_CAPSULE | ORAL | 1 refills | Status: DC

## 2023-09-28 NOTE — Telephone Encounter (Signed)
Pt stated that she is asking if we can refill her gabapentin because she no longer has a pain dr he as retired, she needs a refill for her nortiptyline I will send a refill in for that.  Pt uses [facility]

## 2023-09-29 NOTE — Telephone Encounter (Signed)
Pt stated she will call the pain clinic to find out

## 2023-09-29 NOTE — Telephone Encounter (Signed)
Who will be managing her chronic pain? They should still have a provider to cover and take over when someone leaves the practice

## 2023-10-26 ENCOUNTER — Other Ambulatory Visit: Payer: Self-pay | Admitting: Neurology

## 2023-11-07 ENCOUNTER — Other Ambulatory Visit: Payer: Self-pay | Admitting: Neurology

## 2023-11-20 ENCOUNTER — Other Ambulatory Visit: Payer: Self-pay | Admitting: Neurology

## 2023-12-04 ENCOUNTER — Ambulatory Visit: Payer: Medicaid Other | Admitting: Neurology

## 2023-12-08 ENCOUNTER — Encounter: Payer: Self-pay | Admitting: Neurology

## 2023-12-08 ENCOUNTER — Telehealth: Payer: Medicaid Other | Admitting: Neurology

## 2023-12-08 VITALS — Ht 63.0 in | Wt 214.0 lb

## 2023-12-08 DIAGNOSIS — G4700 Insomnia, unspecified: Secondary | ICD-10-CM

## 2023-12-08 DIAGNOSIS — G2581 Restless legs syndrome: Secondary | ICD-10-CM | POA: Diagnosis not present

## 2023-12-08 DIAGNOSIS — R252 Cramp and spasm: Secondary | ICD-10-CM

## 2023-12-08 MED ORDER — PRAMIPEXOLE DIHYDROCHLORIDE 1 MG PO TABS: ORAL_TABLET | ORAL | 3 refills | Status: DC

## 2023-12-08 MED ORDER — CLONAZEPAM 0.5 MG PO TABS: 0.5000 mg | ORAL_TABLET | Freq: Two times a day (BID) | ORAL | 5 refills | Status: DC

## 2023-12-08 MED ORDER — NORTRIPTYLINE HCL 50 MG PO CAPS: ORAL_CAPSULE | ORAL | 11 refills | Status: DC
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
## 2023-12-08 NOTE — Patient Instructions (Signed)
 Good to see you.  Increase Nortriptyline to 100mg  every night. A new prescription for Nortriptyline 50mg : Take 2 capsules every night was sent in.  2. Take the Pramipexole 1mg : 1/2 tablet 2 hours before bedtime, then 1/2 tablet at bedtime  3. Continue Clonazepam 0.5mg  twice a day  4. Proceed with Pain Management, Sleep Medicine, and Psychiatry/therapy visits  5. Follow-up in 6 months, call for any changes

## 2023-12-26 ENCOUNTER — Encounter: Payer: Self-pay | Admitting: Physical Medicine and Rehabilitation

## 2023-12-28 ENCOUNTER — Telehealth: Payer: Self-pay | Admitting: Neurology

## 2023-12-28 NOTE — Telephone Encounter (Signed)
 We cannot go up more on the Mirapex, that can actually cause worsening of symptoms. She is already on a high dose of Mirapex. The next step would be seeing a sleep specialist at [facility] or [facility], she can also ask her Pain specialist if they can prescribe Methadone which is used for RLS also. But our office does not prescribe it.

## 2023-12-28 NOTE — Telephone Encounter (Signed)
 Pt called informed that Dr Karel Jarvis stated that We cannot go up more on the Mirapex, that can actually cause worsening of symptoms. She is already on a high dose of Mirapex. The next step would be seeing a sleep specialist at [facility] or [facility] Pt stated that her PCP has sent a referral to a sleep specialist she is waiting for an appointment, she was informed  can also ask her Pain specialist if they can prescribe Methadone which is used for RLS also. But our office does not prescribe it.

## 2023-12-28 NOTE — Telephone Encounter (Signed)
 Pt. Is calling to up dosage of MIRAPEX as she can not sleep due to the constant pain in her legs. Was doing better when Rx was 1.5 mg instead of 1mg , please contact pt.

## 2024-01-09 ENCOUNTER — Encounter: Payer: Self-pay | Admitting: *Deleted

## 2024-01-15 ENCOUNTER — Telehealth: Payer: Self-pay | Admitting: Neurology

## 2024-01-15 NOTE — Telephone Encounter (Signed)
 Pt.. Would like to discuss the 1mg v RX MIRAPEX no longer helping leg issue, call bk Pt.

## 2024-01-16 NOTE — Telephone Encounter (Signed)
 Pls let her know that the Mirapex she is taking is a high dose, and high dose can actually cause worsening of symptoms. She has been on all the medications I can prescribe, the next step would be seeing a sleep specialist or talking to her pain specialist about Methadone for her restless legs. Thanks

## 2024-01-16 NOTE — Telephone Encounter (Signed)
 Pt called an informed that Dr Karel Jarvis stated that the Mirapex she is taking is a high dose, and high dose can actually cause worsening of symptoms. She has been on all the medications I can prescribe, the next step would be seeing a sleep specialist or talking to her pain specialist about Methadone for her restless legs. Pt stated she is going to talk to her pain Dr About it,

## 2024-01-16 NOTE — Progress Notes (Deleted)
 GI Office Note    Referring Provider: Shelby Dubin, FNP Primary Care Physician:  Shelby Dubin, FNP  Primary Gastroenterologist: Roetta Sessions, MD   Chief Complaint   No chief complaint on file.   History of Present Illness   Beverly Tran is a 53 y.o. female presenting today for dysphagia. She was last seen in 03/2021 for GERD, IBS-D, dysphagia.   Never completed EGD/colonoscopy after last ov. Never had celiac seroloiges.     Last Colonoscopy: (2003) poor colonic prep, grossly normal rectum, colon and TI. (No path available) Last Endoscopy: (2003) normal esophagus, small bowel biopsy (path not available)    Medications   Current Outpatient Medications  Medication Sig Dispense Refill   albuterol (VENTOLIN HFA) 108 (90 Base) MCG/ACT inhaler Inhale 2 puffs into the lungs every 4 (four) hours as needed for wheezing or shortness of breath. 18 g 1   clonazePAM (KLONOPIN) 0.5 MG tablet Take 1 tablet (0.5 mg total) by mouth 2 (two) times daily. 60 tablet 5   EPINEPHrine 0.3 mg/0.3 mL IJ SOAJ injection Inject 0.3 mg into the muscle as needed for anaphylaxis.     famotidine (PEPCID) 20 MG tablet Take 1 tablet (20 mg total) by mouth daily. (Patient taking differently: Take 40 mg by mouth daily.) 30 tablet 2   gabapentin (NEURONTIN) 300 MG capsule Take 1 capsule (300 mg total) by mouth 3 (three) times daily. (Patient taking differently: Take 600 mg by mouth 3 (three) times daily.) 90 capsule 5   ipratropium-albuterol (DUONEB) 0.5-2.5 (3) MG/3ML SOLN Take 3 mLs by nebulization as needed (shortness of breath or wheezing).     JARDIANCE 10 MG TABS tablet Take 10 mg by mouth daily.     LANTUS SOLOSTAR 100 UNIT/ML Solostar Pen Inject 25 Units into the skin 2 (two) times daily.     metFORMIN (GLUCOPHAGE) 500 MG tablet Take 1,000 mg by mouth 2 (two) times daily with a meal.     MOUNJARO 7.5 MG/0.5ML Pen Inject 7.5 mg into the skin once a week.     naloxone (NARCAN) nasal spray 4  mg/0.1 mL Place 0.4 mg into the nose once.     nortriptyline (PAMELOR) 50 MG capsule Take 2 capsules every night 60 capsule 11   olmesartan-hydrochlorothiazide (BENICAR HCT) 20-12.5 MG tablet Take 1 tablet by mouth daily.     oxyCODONE-acetaminophen (PERCOCET) 10-325 MG tablet Take 1 tablet by mouth every 6 (six) hours as needed for pain. (Patient not taking: Reported on 12/08/2023)     pantoprazole (PROTONIX) 40 MG tablet TAKE ONE TABLET BY MOUTH DAILY (MORNING) (Patient taking differently: Take 40 mg by mouth daily.) 30 tablet 5   pramipexole (MIRAPEX) 1 MG tablet Take 1/2 tablet 2 hours before bedtime, then 1/2 tablet at bedtime 90 tablet 3   rosuvastatin (CRESTOR) 10 MG tablet Take 10 mg by mouth at bedtime.     SYMBICORT 80-4.5 MCG/ACT inhaler Take 2 puffs first thing in am and then another 2 puffs about 12 hours later. 10.2 g 11   No current facility-administered medications for this visit.    Allergies   Allergies as of 01/17/2024 - Review Complete 12/08/2023  Allergen Reaction Noted   Bee venom Anaphylaxis and Swelling 04/19/2013   Diclofenac Anaphylaxis 11/25/2020   Salami [pickled meat] Shortness Of Breath and Swelling 04/19/2013   Aspirin Other (See Comments) 03/02/2012   Compazine [prochlorperazine] Hives 03/02/2012   Imitrex [sumatriptan] Hives 03/02/2012   Lamotrigine Other (See Comments)  02/17/2022   Lisinopril  03/15/2022   Nubain [nalbuphine hcl] Swelling and Other (See Comments) 03/02/2012   Thorazine [chlorpromazine] Hives 03/02/2012   Topamax [topiramate] Other (See Comments) 07/06/2021   Voltaren [diclofenac sodium] Swelling 03/02/2012   Zofran [ondansetron hcl] Nausea Only 05/04/2013     Past Medical History   Past Medical History:  Diagnosis Date   Anxiety    Arthritis    Asthma    Back pain, chronic    Depression    Diabetes mellitus without complication (HCC)    GERD (gastroesophageal reflux disease)    High cholesterol    Hypertension    IBS  (irritable bowel syndrome)    Migraine     Past Surgical History   Past Surgical History:  Procedure Laterality Date   APPENDECTOMY     CESAREAN SECTION     CHOLECYSTECTOMY     COLONOSCOPY  2003   poor prep, grossly normal rectum, colon, and TI. Path not available.    ESOPHAGOGASTRODUODENOSCOPY  2003   Dr. Jena Gauss:  normal esophagus, stomach, duodenum s/p small bowel biopsy.    HERNIA REPAIR     TEE WITHOUT CARDIOVERSION N/A 03/03/2022   Procedure: TRANSESOPHAGEAL ECHOCARDIOGRAM (TEE);  Surgeon: Lewayne Bunting, MD;  Location: [facility] ENDOSCOPY;  Service: Cardiovascular;  Laterality: N/A;   TUBAL LIGATION      Past Family History   Family History  Problem Relation Age of Onset   Seizures Mother    Heart failure Mother    Heart failure Father    Colon cancer Neg Hx    Colon polyps Neg Hx     Past Social History   Social History   Socioeconomic History   Marital status: Divorced    Spouse name: Not on file   Number of children: Not on file   Years of education: Not on file   Highest education level: Not on file  Occupational History   Not on file  Tobacco Use   Smoking status: Some Days    Current packs/day: 0.00    Average packs/day: 1 pack/day for 7.0 years (7.0 ttl pk-yrs)    Types: Cigarettes    Start date: 02/02/2013    Last attempt to quit: 02/03/2020    Years since quitting: 3.9   Smokeless tobacco: Never  Vaping Use   Vaping status: Never Used  Substance and Sexual Activity   Alcohol use: No   Drug use: No   Sexual activity: Yes    Birth control/protection: Surgical  Other Topics Concern   Not on file  Social History Narrative   Are you right handed or left handed? Right handed    Are you currently employed ? 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 no   Who lives with you? Husband    What type of home do you live in: 1 story or 2 story?  Apartment        Social Drivers of Health   Financial Resource Strain: Low Risk   (10/05/2023)   Received from [facility]   Overall Financial Resource Strain (CARDIA)    Difficulty of Paying Living Expenses: Not very hard  Food Insecurity: No Food Insecurity (12/26/2022)   Hunger Vital Sign    Worried About Running Out of Food in the Last Year: Never true    Ran Out of Food in the Last Year: Never true  Transportation Needs: No Transportation Needs (12/26/2022)   PRAPARE -  Administrator, Civil Service (Medical): No    Lack of Transportation (Non-Medical): No  Physical Activity: Inactive (10/05/2023)   Received from [facility]   Exercise Vital Sign    Days of Exercise per Week: 0 days    Minutes of Exercise per Session: 0 min  Stress: No Stress Concern Present (10/05/2023)   Received from [facility] - Occupational Stress Questionnaire    Feeling of Stress : Not at all  Social Connections: Unknown (03/01/2022)   Received from [facility]   Social Network    Social Network: Not on file  Intimate Partner Violence: Not At Risk (12/26/2022)   Humiliation, Afraid, Rape, and Kick questionnaire    Fear of Current or Ex-Partner: No    Emotionally Abused: No    Physically Abused: No    Sexually Abused: No    Review of Systems   General: Negative for anorexia, weight loss, fever, chills, fatigue, weakness. ENT: Negative for hoarseness, difficulty swallowing , nasal congestion. CV: Negative for chest pain, angina, palpitations, dyspnea on exertion, peripheral edema.  Respiratory: Negative for dyspnea at rest, dyspnea on exertion, cough, sputum, wheezing.  GI: See history of present illness. GU:  Negative for dysuria, hematuria, urinary incontinence, urinary frequency, nocturnal urination.  Endo: Negative for unusual weight change.     Physical Exam   LMP 10/13/2015    General: Well-nourished, well-developed in no acute distress.  Eyes: No icterus. Mouth: Oropharyngeal mucosa  moist and pink , no lesions erythema or exudate. Lungs: Clear to auscultation bilaterally.  Heart: Regular rate and rhythm, no murmurs rubs or gallops.  Abdomen: Bowel sounds are normal, nontender, nondistended, no hepatosplenomegaly or masses,  no abdominal bruits or hernia , no rebound or guarding.  Rectal: ***  Extremities: No lower extremity edema. No clubbing or deformities. Neuro: Alert and oriented x 4   Skin: Warm and dry, no jaundice.   Psych: Alert and cooperative, normal mood and affect.  Labs   Lab Results  Component Value Date   NA 138 01/01/2023   CL 100 01/01/2023   K 4.1 01/01/2023   CO2 29 01/01/2023   BUN 15 01/01/2023   CREATININE 0.64 01/01/2023   GFRNONAA >60 01/01/2023   CALCIUM 9.2 01/01/2023   PHOS 2.2 (L) 12/31/2022   ALBUMIN 3.0 (L) 12/31/2022   GLUCOSE 140 (H) 01/01/2023   Lab Results  Component Value Date   ALT 22 12/31/2022   AST 22 12/31/2022   ALKPHOS 36 (L) 12/31/2022   BILITOT 0.6 12/31/2022   Lab Results  Component Value Date   WBC 8.1 01/01/2023   HGB 12.9 01/01/2023   HCT 39.0 01/01/2023   MCV 97.3 01/01/2023   PLT 158 01/01/2023   Lab Results  Component Value Date   TSH 2.487 12/31/2022    Imaging Studies   No results found.  Assessment       PLAN   ***   Leanna Battles. Melvyn Neth, MHS, PA-C [facility]

## 2024-01-17 ENCOUNTER — Ambulatory Visit: Admitting: Gastroenterology

## 2024-01-23 ENCOUNTER — Encounter: Payer: Self-pay | Admitting: Gastroenterology

## 2024-01-29 ENCOUNTER — Encounter: Admitting: Physical Medicine and Rehabilitation

## 2024-02-20 ENCOUNTER — Telehealth: Payer: Self-pay | Admitting: Neurology

## 2024-02-20 ENCOUNTER — Ambulatory Visit: Admitting: Gastroenterology

## 2024-02-20 MED ORDER — PRAMIPEXOLE DIHYDROCHLORIDE 1 MG PO TABS: ORAL_TABLET | ORAL | 0 refills | Status: DC

## 2024-02-20 NOTE — Telephone Encounter (Signed)
 Pt. Asking for Rx refill but did not provide the type of Rx

## 2024-02-20 NOTE — Telephone Encounter (Signed)
 1. Which medications need refilled? (List name and dosage, if known) pramipexole   2. Which pharmacy/location is medication to be sent to? (include street and city if local pharmacy) [facility] [address] [place]

## 2024-02-20 NOTE — Telephone Encounter (Signed)
 Refill sent in for pt. Pt called an informed

## 2024-02-20 NOTE — Telephone Encounter (Signed)
 Ok to send in her current dose, thanks

## 2024-02-20 NOTE — Addendum Note (Signed)
 Addended by: Erica Hau on: 02/20/2024 01:29 PM   Modules accepted: Orders

## 2024-02-20 NOTE — Addendum Note (Signed)
 Addended by: Erica Hau on: 02/20/2024 01:27 PM   Modules accepted: Orders

## 2024-02-20 NOTE — Progress Notes (Deleted)
 GI Office Note    Referring Provider: Gwenyth Leo, FNP Primary Care Physician:  Bucio, Elsa C, FNP  Primary Gastroenterologist: Rheba Cedar, MD   Chief Complaint   No chief complaint on file.   History of Present Illness   Beverly Tran is a 53 y.o. female presenting today for follow up. Last seen 03/2021. H/o GERD, IBS-D, dysphagia.   labs  Failed bentyl, xifaxan . Wosre since gb removed in 1989.  Trial of colestid  2 tabs once daily to bid. Never completed celiac labs. Never completed egd/ed/tcs   Medications   Current Outpatient Medications  Medication Sig Dispense Refill   albuterol  (VENTOLIN  HFA) 108 (90 Base) MCG/ACT inhaler Inhale 2 puffs into the lungs every 4 (four) hours as needed for wheezing or shortness of breath. 18 g 1   clonazePAM  (KLONOPIN ) 0.5 MG tablet Take 1 tablet (0.5 mg total) by mouth 2 (two) times daily. 60 tablet 5   EPINEPHrine 0.3 mg/0.3 mL IJ SOAJ injection Inject 0.3 mg into the muscle as needed for anaphylaxis.     famotidine  (PEPCID ) 20 MG tablet Take 1 tablet (20 mg total) by mouth daily. (Patient taking differently: Take 40 mg by mouth daily.) 30 tablet 2   gabapentin  (NEURONTIN ) 300 MG capsule Take 1 capsule (300 mg total) by mouth 3 (three) times daily. (Patient taking differently: Take 600 mg by mouth 3 (three) times daily.) 90 capsule 5   ipratropium-albuterol  (DUONEB) 0.5-2.5 (3) MG/3ML SOLN Take 3 mLs by nebulization as needed (shortness of breath or wheezing).     JARDIANCE 10 MG TABS tablet Take 10 mg by mouth daily.     LANTUS SOLOSTAR 100 UNIT/ML Solostar Pen Inject 25 Units into the skin 2 (two) times daily.     metFORMIN  (GLUCOPHAGE ) 500 MG tablet Take 1,000 mg by mouth 2 (two) times daily with a meal.     MOUNJARO 7.5 MG/0.5ML Pen Inject 7.5 mg into the skin once a week.     naloxone (NARCAN) nasal spray 4 mg/0.1 mL Place 0.4 mg into the nose once.     nortriptyline  (PAMELOR ) 50 MG capsule Take 2 capsules every night  60 capsule 11   olmesartan -hydrochlorothiazide  (BENICAR  HCT) 20-12.5 MG tablet Take 1 tablet by mouth daily.     oxyCODONE -acetaminophen  (PERCOCET) 10-325 MG tablet Take 1 tablet by mouth every 6 (six) hours as needed for pain. (Patient not taking: Reported on 12/08/2023)     pantoprazole  (PROTONIX ) 40 MG tablet TAKE ONE TABLET BY MOUTH DAILY (MORNING) (Patient taking differently: Take 40 mg by mouth daily.) 30 tablet 5   pramipexole  (MIRAPEX ) 1 MG tablet Take 1/2 tablet 2 hours before bedtime, then 1/2 tablet at bedtime 90 tablet 3   rosuvastatin  (CRESTOR ) 10 MG tablet Take 10 mg by mouth at bedtime.     SYMBICORT  80-4.5 MCG/ACT inhaler Take 2 puffs first thing in am and then another 2 puffs about 12 hours later. 10.2 g 11   No current facility-administered medications for this visit.    Allergies   Allergies as of 02/20/2024 - Review Complete 12/08/2023  Allergen Reaction Noted   Bee venom Anaphylaxis and Swelling 04/19/2013   Diclofenac Anaphylaxis 11/25/2020   Salami [pickled meat] Shortness Of Breath and Swelling 04/19/2013   Aspirin Other (See Comments) 03/02/2012   Compazine [prochlorperazine] Hives 03/02/2012   Imitrex [sumatriptan] Hives 03/02/2012   Lamotrigine Other (See Comments) 02/17/2022   Lisinopril  03/15/2022   Nubain [nalbuphine hcl] Swelling and Other (See  Comments) 03/02/2012   Thorazine [chlorpromazine] Hives 03/02/2012   Topamax [topiramate] Other (See Comments) 07/06/2021   Voltaren [diclofenac sodium] Swelling 03/02/2012   Zofran  [ondansetron  hcl] Nausea Only 05/04/2013     Past Medical History   Past Medical History:  Diagnosis Date   Anxiety    Arthritis    Asthma    Back pain, chronic    Depression    Diabetes mellitus without complication (HCC)    GERD (gastroesophageal reflux disease)    High cholesterol    Hypertension    IBS (irritable bowel syndrome)    Migraine     Past Surgical History   Past Surgical History:  Procedure Laterality  Date   APPENDECTOMY     CESAREAN SECTION     CHOLECYSTECTOMY     COLONOSCOPY  2003   poor prep, grossly normal rectum, colon, and TI. Path not available.    ESOPHAGOGASTRODUODENOSCOPY  2003   Dr. Riley Cheadle:  normal esophagus, stomach, duodenum s/p small bowel biopsy.    HERNIA REPAIR     TEE WITHOUT CARDIOVERSION N/A 03/03/2022   Procedure: TRANSESOPHAGEAL ECHOCARDIOGRAM (TEE);  Surgeon: Lenise Quince, MD;  Location: [facility] ENDOSCOPY;  Service: Cardiovascular;  Laterality: N/A;   TUBAL LIGATION      Past Family History   Family History  Problem Relation Age of Onset   Seizures Mother    Heart failure Mother    Heart failure Father    Colon cancer Neg Hx    Colon polyps Neg Hx     Past Social History   Social History   Socioeconomic History   Marital status: Divorced    Spouse name: Not on file   Number of children: Not on file   Years of education: Not on file   Highest education level: Not on file  Occupational History   Not on file  Tobacco Use   Smoking status: Some Days    Current packs/day: 0.00    Average packs/day: 1 pack/day for 7.0 years (7.0 ttl pk-yrs)    Types: Cigarettes    Start date: 02/02/2013    Last attempt to quit: 02/03/2020    Years since quitting: 4.0   Smokeless tobacco: Never  Vaping Use   Vaping status: Never Used  Substance and Sexual Activity   Alcohol use: No   Drug use: No   Sexual activity: Yes    Birth control/protection: Surgical  Other Topics Concern   Not on file  Social History Narrative   Are you right handed or left handed? Right handed    Are you currently employed ? 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 no   Who lives with you? Husband    What type of home do you live in: 1 story or 2 story?  Apartment        Social Drivers of Health   Financial Resource Strain: Low Risk  (10/05/2023)   Received from [facility]   Overall Financial Resource Strain (CARDIA)    Difficulty of Paying  Living Expenses: Not very hard  Food Insecurity: No Food Insecurity (12/26/2022)   Hunger Vital Sign    Worried About Running Out of Food in the Last Year: Never true    Ran Out of Food in the Last Year: Never true  Transportation Needs: No Transportation Needs (12/26/2022)   PRAPARE - Administrator, Civil Service (Medical): No    Lack of Transportation (  Non-Medical): No  Physical Activity: Inactive (10/05/2023)   Received from [facility]   Exercise Vital Sign    Days of Exercise per Week: 0 days    Minutes of Exercise per Session: 0 min  Stress: No Stress Concern Present (10/05/2023)   Received from [facility] - Occupational Stress Questionnaire    Feeling of Stress : Not at all  Social Connections: Unknown (03/01/2022)   Received from [facility]   Social Network    Social Network: Not on file  Intimate Partner Violence: Not At Risk (12/26/2022)   Humiliation, Afraid, Rape, and Kick questionnaire    Fear of Current or Ex-Partner: No    Emotionally Abused: No    Physically Abused: No    Sexually Abused: No    Review of Systems   General: Negative for anorexia, weight loss, fever, chills, fatigue, weakness. ENT: Negative for hoarseness, difficulty swallowing , nasal congestion. CV: Negative for chest pain, angina, palpitations, dyspnea on exertion, peripheral edema.  Respiratory: Negative for dyspnea at rest, dyspnea on exertion, cough, sputum, wheezing.  GI: See history of present illness. GU:  Negative for dysuria, hematuria, urinary incontinence, urinary frequency, nocturnal urination.  Endo: Negative for unusual weight change.     Physical Exam   LMP 10/13/2015    General: Well-nourished, well-developed in no acute distress.  Eyes: No icterus. Mouth: Oropharyngeal mucosa moist and pink , no lesions erythema or exudate. Lungs: Clear to auscultation bilaterally.  Heart: Regular rate and  rhythm, no murmurs rubs or gallops.  Abdomen: Bowel sounds are normal, nontender, nondistended, no hepatosplenomegaly or masses,  no abdominal bruits or hernia , no rebound or guarding.  Rectal: ***  Extremities: No lower extremity edema. No clubbing or deformities. Neuro: Alert and oriented x 4   Skin: Warm and dry, no jaundice.   Psych: Alert and cooperative, normal mood and affect.  Labs   *** Imaging Studies   No results found.  Assessment       PLAN   ***   Trudie Fuse. Harles Lied, MHS, PA-C [facility]

## 2024-02-21 ENCOUNTER — Encounter: Payer: Self-pay | Admitting: Gastroenterology

## 2024-03-19 NOTE — Progress Notes (Signed)
 Subjective:    Patient ID: Beverly Tran, female    DOB: 16-Aug-1971, 53 y.o.   MRN: [id number]  HPI HPI  Beverly Tran is a 53 y.o. year old female  who  has a past medical history of Anxiety, Arthritis, Asthma, Back pain, chronic, Depression, Diabetes mellitus without complication (HCC), GERD (gastroesophageal reflux disease), High cholesterol, Hypertension, IBS (irritable bowel syndrome), and Migraine.   They are presenting to PM&R clinic as a new patient for pain management evaluation. They were referred by Daved Eriksson, NP for treatment of chronic back and hip pain. Reports her old pain management doctor is retiring.   Source: Bilateral low back and hip, extending into right posterior thigh Inciting incident: none  Description of pain: comes and goes "quick" based on her activity; sharp, stabbing - denies burning, numbness, or tingling  Exacerbating factors: standing and activity Remitting factors: relaxation, pain medication, and lying down Red flag symptoms: No red flags for back pain endorsed in Hx or ROS   Medications tried: Topical medications (no effect) : Cortisone, voltaren, salon pas, lidocaine  patches  Nsaids (no effect) : Was taking Ibuprofen 800 mg 4x daily - uses rarely for her R thumb, no effect on hip/back. She has a family Hx of heart issues and just found out she has fluid around her heart.    Tylenol   (no effect) : Was taking 1000 mg  4x daily Opiates  (mild effect) :   For chronic pain, used to see: [facility], Marieta Shorten. [address] [place] [address]   Tramadol  50 mg #7 filled 5/9 - no effect Was on Percocet 10 mg #120 per month through 07/2023 - was taking 4x daily - was helping "some", would bring her pain from 8->6/10 and would last between doses.  Stopped because she could not make it to her docotor's office for refills due to transportation problems.  Butrans 5 mcg patch - "it didn't go well", no  effect. Didn't try to dose increase.   Gabapentin  / Lyrica   (mild effect) :  Gabapentin  600 mg TID -> only taking twice a day right now because she can't get refills -> helps "somewhat", does not make her tired. Was on Lyrica  but it made her fall.  TCAs  (no effect) : On pamelor  100 mg, increased over the last 3 months   SNRIs  (no effect) : Was on cymbalta  a long time ago; no side effects, was started by psych - quit seeing that doctor and had her regimen restarted.  Other  (no effect) :  Flexeril  10 mg daily - unsure  Baclofen  10 mg - "I only take it if my blood pressure eis up enough for me to take it, but my BP bottomed out to 68/51 at the doctor's office so I have only take 2 in the last 10 months". It helps a little with her shoulder pain but not her back.  clonazepam  - for anxiety   Recently was denied medication for dyskinesia - Ingrezza.   Other treatments: PT/OT  (unsure of effect) : Has not tried in several years; she does a HEP with her fiance who encourages her to be active. She tries to walk <0.5 miles per day, but it causes severe pain.   Accupuncture/chiropractor/massage  (never tried) :  TENs unit (never tried) :  Injections (never tried) : Was offered once and she saw the needle and got scared - "I can't tolerate pain very  much". She does not typically have an issue with needles but had an epidural with her first child, which was very painful and has left residual swelling in her back.   Surgery (never tried) : Was told she was not a good candidate due to her respiratory issues.   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  Goals for pain control: Be able to walk longer, to tolerate standing longer periods for cooking.   Prior UDS results: No smoking (quit 2 years ago, 13 pack years), no drinking, and recreational drug use.      Component Value Date/Time   LABOPIA NONE DETECTED 12/30/2022 0920    COCAINSCRNUR NONE DETECTED 12/30/2022 0920   LABBENZ POSITIVE (A) 12/30/2022 0920   AMPHETMU NONE DETECTED 12/30/2022 0920   THCU NONE DETECTED 12/30/2022 0920   LABBARB NONE DETECTED 12/30/2022 0920     Pain Inventory Average Pain 8 Pain Right Now 9 My pain is intermittent and sharp  In the last 24 hours, has pain interfered with the following? General activity 9 Relation with others 2 Enjoyment of life 3 What TIME of day is your pain at its worst? night and varies Sleep (in general) Poor  Pain is worse with: walking, bending, sitting, standing, and some activites Pain improves with: rest and medication Relief from Meds: 5  walk without assistance use a walker how many minutes can you walk? 5 ability to climb steps?  no do you drive?  no  disabled: date disabled 09/01/2023 I need assistance with the following:  dressing, bathing, meal prep, household duties, and shopping  bowel control problems weakness tremor tingling trouble walking spasms anxiety  Any changes since last visit?  no  Any changes since last visit?  no    Family History  Problem Relation Age of Onset   Seizures Mother    Heart failure Mother    Heart failure Father    Colon cancer Neg Hx    Colon polyps Neg Hx    Social History   Socioeconomic History   Marital status: Divorced    Spouse name: Not on file   Number of children: Not on file   Years of education: Not on file   Highest education level: Not on file  Occupational History   Not on file  Tobacco Use   Smoking status: Some Days    Current packs/day: 0.00    Average packs/day: 1 pack/day for 7.0 years (7.0 ttl pk-yrs)    Types: Cigarettes    Start date: 02/02/2013    Last attempt to quit: 02/03/2020    Years since quitting: 4.1   Smokeless tobacco: Never  Vaping Use   Vaping status: Never Used  Substance and Sexual Activity   Alcohol use: No   Drug use: No   Sexual activity: Yes    Birth control/protection: Surgical   Other Topics Concern   Not on file  Social History Narrative   Are you right handed or left handed? Right handed    Are you currently employed ? 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 no   Who lives with you? Husband    What type of home do you live in: 1 story or 2 story?  Apartment        Social Drivers of Health   Financial Resource Strain: Low Risk  (10/05/2023)  Received from [facility]   Overall Financial Resource Strain (CARDIA)    Difficulty of Paying Living Expenses: Not very hard  Food Insecurity: Low Risk  (03/13/2024)   Received from [facility]   Hunger Vital Sign    Worried About Running Out of Food in the Last Year: Never true    Ran Out of Food in the Last Year: Never true  Transportation Needs: No Transportation Needs (03/13/2024)   Received from [facility]    In the past 12 months, has lack of reliable transportation kept you from medical appointments, meetings, work or from getting things needed for daily living? : No  Physical Activity: Inactive (10/05/2023)   Received from [facility]   Exercise Vital Sign    Days of Exercise per Week: 0 days    Minutes of Exercise per Session: 0 min  Stress: No Stress Concern Present (10/05/2023)   Received from [facility] of Occupational Health - Occupational Stress Questionnaire    Feeling of Stress : Not at all  Social Connections: Unknown (03/01/2022)   Received from [facility]   Social Network    Social Network: Not on file   Past Surgical History:  Procedure Laterality Date   APPENDECTOMY     CESAREAN SECTION     CHOLECYSTECTOMY     COLONOSCOPY  2003   poor prep, grossly normal rectum, colon, and TI. Path not available.    ESOPHAGOGASTRODUODENOSCOPY  2003   Dr. Riley Cheadle:  normal esophagus, stomach, duodenum s/p small bowel biopsy.    HERNIA REPAIR     TEE WITHOUT CARDIOVERSION N/A 03/03/2022   Procedure:  TRANSESOPHAGEAL ECHOCARDIOGRAM (TEE);  Surgeon: Lenise Quince, MD;  Location: [facility] ENDOSCOPY;  Service: Cardiovascular;  Laterality: N/A;   TUBAL LIGATION     Past Medical History:  Diagnosis Date   Anxiety    Arthritis    Asthma    Back pain, chronic    Depression    Diabetes mellitus without complication (HCC)    GERD (gastroesophageal reflux disease)    High cholesterol    Hypertension    IBS (irritable bowel syndrome)    Migraine    BP 111/62 (BP Location: Left Arm, Patient Position: Sitting)   Pulse (!) 112   Ht 5\' 3"  (1.6 m)   Wt 202 lb (91.6 kg)   LMP 10/13/2015   SpO2 97%   BMI 35.78 kg/m   Opioid Risk Score:   Fall Risk Score:  `1  Depression screen [facility] 2/9     03/20/2024   11:25 AM 03/17/2022    9:35 AM  Depression screen PHQ 2/9  Decreased Interest 0 0  Down, Depressed, Hopeless 0 1  PHQ - 2 Score 0 1  Altered sleeping 1   Tired, decreased energy 0   Change in appetite 0   Feeling bad or failure about yourself  0   Trouble concentrating 0   Moving slowly or fidgety/restless 1   Suicidal thoughts 0   PHQ-9 Score 2   Difficult doing work/chores Not difficult at all      Review of Systems  All other systems reviewed and are negative.      Objective:   Physical Exam   PE: Constitution: Appropriate appearance for age. No apparent distress  +Obese Resp: No respiratory distress. No accessory muscle usage. on RA Cardio:  BL LE delayed capillary refill 8 seconds b/l Palpable DP 1+ LLE,  not palpable RLE  Abdomen: Nondistended. Nontender.   Psych: Appropriate mood and affect. Neuro: AAOx4. No apparent cognitive deficits  Skin: Changes in bilateral nailbeds  Neurologic Exam:   DTRs: Reflexes were 2+ in bilateral achilles, patella, biceps, BR and triceps. Babinsky: flexor responses b/l.   Hoffmans: negative b/l Sensory exam: revealed normal sensation in all dermatomal regions in bilateral upper extremities and bilateral lower extremities Motor  exam: strength 5/5 throughout bilateral upper extremities and bilateral lower extremities Coordination: Fine motor coordination was normal.   Gait:  antalgic, stiff, with use of narrow-base quad cane.  MSK: B back + exquitie TTP bilateal lumbar paraspoinals, quadratus, L hip/GTB - SI joint testing b/l + facet loading bilaterally + slump bilaterally       Assessment & Plan:   CHER FRANZONI is a 53 y.o. year old female  who  has a past medical history of Anxiety, Arthritis, Asthma, Back pain, chronic, Depression, Diabetes mellitus without complication (HCC), GERD (gastroesophageal reflux disease), High cholesterol, Hypertension, IBS (irritable bowel syndrome), and Migraine.   They are presenting to PM&R clinic as a new patient for treatment of chronic pain of the bilateral low back and hip, extending into RIGHT posterior thigh.  Chronic pain syndrome Encounter for opiate analgesic use agreement -     Drug Tox Alc Metab w/Con, Oral Fld -     Drug Tox Monitor 1 w/Conf, Oral Fld  I am re-ordering you gabapentin  at prior 600 mg three times daily dosing for 6 months; I have reviewed your renal function tests from last month and they look OK for this dose.  Patient has failed duloxetine , tylenol , and lyrica . I would stop advil/ibuprofen due to recent concerns of heart disease.  We discussed starting low dose naltrexone vs. Butrans patch starting at 5 mcg and increasing as needed; patient has opted for butrans patch. I will send a script for this once we have gotten drug screening results AND records from [place] confirming you were not discharged from their practice.    Follow-up with my nurse practitioner Emilia Harbour in 1 month.  If pain is well-controlled on your current regimen, you will see her every other month and me every 6 months.  If not, we will follow-up more frequently.  Feel free to use MyChart between appointments to discuss any acute issues, making usually get back to within 48  hours.   Chronic bilateral low back pain with right-sided sciatica MRI lumbar spine 2022 reviewed IMPRESSION: 1. Degenerative changes of the lumbar spine, more pronounced at the level of the facet joints at L4-5 where there is mild spinal canal stenosis, mild right and moderate left neural foraminal narrowing.  2. Mild bilateral neural foraminal narrowing at L3-4.   We discussed how epidural steroid injections are likely to give you the most benefit, however patient declines referral at this time due to fear of needles  Myofascial low back pain  We discussed trigger point injections for myofascial pain; patient will think about this and re-address next visit.  I am referring you back to aquatherapy for gentle ROM and increased activity tolerance.  Extremity cyanosis -     US  ARTERIAL LOWER EXTREMITY DUPLEX BILATERAL; Future  I am getting an ABI to test for vascular disease in your legs.    Other orders -     Gabapentin ; Take 2 capsules (600 mg total) by mouth 3 (three) times daily.  Dispense: 180 capsule; Refill: 5

## 2024-03-20 ENCOUNTER — Encounter: Payer: Self-pay | Admitting: Physical Medicine and Rehabilitation

## 2024-03-20 ENCOUNTER — Encounter: Attending: Physical Medicine and Rehabilitation | Admitting: Physical Medicine and Rehabilitation

## 2024-03-20 VITALS — BP 111/62 | HR 112 | Ht 63.0 in | Wt 202.0 lb

## 2024-03-20 DIAGNOSIS — G8929 Other chronic pain: Secondary | ICD-10-CM

## 2024-03-20 DIAGNOSIS — R230 Cyanosis: Secondary | ICD-10-CM | POA: Insufficient documentation

## 2024-03-20 DIAGNOSIS — G894 Chronic pain syndrome: Secondary | ICD-10-CM

## 2024-03-20 DIAGNOSIS — Z029 Encounter for administrative examinations, unspecified: Secondary | ICD-10-CM | POA: Diagnosis not present

## 2024-03-20 DIAGNOSIS — G8911 Acute pain due to trauma: Secondary | ICD-10-CM | POA: Insufficient documentation

## 2024-03-20 DIAGNOSIS — M545 Low back pain, unspecified: Secondary | ICD-10-CM | POA: Diagnosis present

## 2024-03-20 DIAGNOSIS — M5441 Lumbago with sciatica, right side: Secondary | ICD-10-CM | POA: Diagnosis present

## 2024-03-20 MED ORDER — GABAPENTIN 300 MG PO CAPS: 600.0000 mg | ORAL_CAPSULE | Freq: Three times a day (TID) | ORAL | 5 refills | Status: DC

## 2024-03-20 NOTE — Patient Instructions (Signed)
 I am re-ordering you gabapentin  at prior 600 mg three times daily dosing for 6 months; I have reviewed your renal function tests from last month and they look OK for this dose.  We discussed how epidural steroid injections are likely to give you the most benefit, however patient declines referral at this time due to fear of needles  We discussed trigger point injections for myofascial pain; patient will think about this and re-address next visit.  Patient has failed duloxetine , tylenol , and lyrica . I would stop advil/ibuprofen due to recent concerns of heart disease.  We discussed starting low dose naltrexone vs. Butrans patch starting at 5 mcg and increasing as needed; patient has opted for butrans patch. I will send a script for this once we have gotten drug screening results AND records from [place] confirming you were not discharged from their practice.   I am referring you back to aquatherapy for gentle ROM and increased activity tolerance.  I am getting an ABI to test for vascular disease in your legs.   Follow-up with my nurse practitioner Emilia Harbour in 1 month.  If pain is well-controlled on your current regimen, you will see her every other month and me every 6 months.  If not, we will follow-up more frequently.  Feel free to use MyChart between appointments to discuss any acute issues, making usually get back to within 48 hours.

## 2024-03-25 ENCOUNTER — Other Ambulatory Visit: Payer: Self-pay | Admitting: Physical Medicine and Rehabilitation

## 2024-03-25 DIAGNOSIS — R230 Cyanosis: Secondary | ICD-10-CM

## 2024-03-27 LAB — DRUG TOX ALC METAB W/CON, ORAL FLD: Alcohol Metabolite: NEGATIVE ng/mL (ref <25)

## 2024-04-02 ENCOUNTER — Inpatient Hospital Stay: Admission: RE | Admit: 2024-04-02 | Source: Ambulatory Visit

## 2024-04-02 ENCOUNTER — Other Ambulatory Visit

## 2024-04-04 ENCOUNTER — Other Ambulatory Visit: Payer: Self-pay | Admitting: Neurology

## 2024-04-04 MED ORDER — CLONAZEPAM 0.5 MG PO TABS: 0.5000 mg | ORAL_TABLET | Freq: Two times a day (BID) | ORAL | 2 refills | Status: DC

## 2024-04-04 NOTE — Telephone Encounter (Signed)
 Pt just wanted us  to know that she will need new RX to last until her appt,

## 2024-04-04 NOTE — Telephone Encounter (Signed)
 Pt.cld needs to discuss Rx KLONOPIN , please call back

## 2024-04-08 ENCOUNTER — Telehealth: Payer: Self-pay | Admitting: Physical Medicine and Rehabilitation

## 2024-04-08 NOTE — Telephone Encounter (Signed)
 Pt called in requesting a call back she wants to discuss about her last pain doctor

## 2024-04-09 ENCOUNTER — Encounter: Payer: Self-pay | Admitting: Physical Medicine and Rehabilitation

## 2024-04-09 NOTE — Telephone Encounter (Signed)
 Called patient back, have not received records from [facility] yet.  She states she was discharged from their practice due to Valium  being found in aberrant UDS, but claims she was on a different benzodiazepine at that time and does not know how this resulted.  Discussed that there is some cross-reactivity with drug screen results depending on what she was taking at the time, and that we would need to see the actual results to determine next steps.  She has the records request form for [facility] at home; advised her to either bring that into our office, or call their office and asked that records be transmitted to Dr. Emeline at [phone]. Patient understanding.

## 2024-04-10 ENCOUNTER — Ambulatory Visit: Admitting: Allergy & Immunology

## 2024-04-10 ENCOUNTER — Other Ambulatory Visit: Payer: Self-pay | Admitting: Neurology

## 2024-04-10 NOTE — Telephone Encounter (Signed)
 Calling to give verbal orders for a refill of Rx clonazePAM  0.5 mg [facility] in [place]

## 2024-04-12 ENCOUNTER — Other Ambulatory Visit: Payer: Self-pay | Admitting: Neurology

## 2024-04-12 MED ORDER — CLONAZEPAM 0.5 MG PO TABS: 0.5000 mg | ORAL_TABLET | Freq: Two times a day (BID) | ORAL | 5 refills | Status: DC

## 2024-04-12 NOTE — Telephone Encounter (Signed)
 Pt. Needs Rx clonazePAM  (KLONOPIN ) 0.5 MG tablet [facility] [phone] [place]

## 2024-04-12 NOTE — Telephone Encounter (Signed)
 Pharmacy called pt picked up 03/22/2024 next refill due 04/21/2024

## 2024-04-17 ENCOUNTER — Encounter: Attending: Registered Nurse | Admitting: Registered Nurse

## 2024-04-17 DIAGNOSIS — G8929 Other chronic pain: Secondary | ICD-10-CM | POA: Insufficient documentation

## 2024-04-17 DIAGNOSIS — G894 Chronic pain syndrome: Secondary | ICD-10-CM | POA: Insufficient documentation

## 2024-04-17 DIAGNOSIS — M5441 Lumbago with sciatica, right side: Secondary | ICD-10-CM | POA: Insufficient documentation

## 2024-04-17 DIAGNOSIS — R000 Tachycardia, unspecified: Secondary | ICD-10-CM | POA: Insufficient documentation

## 2024-04-18 ENCOUNTER — Telehealth: Payer: Self-pay | Admitting: Neurology

## 2024-04-18 MED ORDER — PRAMIPEXOLE DIHYDROCHLORIDE 1 MG PO TABS: ORAL_TABLET | ORAL | 3 refills | Status: DC

## 2024-04-18 NOTE — Telephone Encounter (Signed)
 Pt needs a refill on her medication  that Dr Georjean for  [facility]  [facility]

## 2024-04-18 NOTE — Telephone Encounter (Signed)
 Rx sent, thanks

## 2024-04-24 ENCOUNTER — Telehealth: Payer: Self-pay

## 2024-04-24 ENCOUNTER — Other Ambulatory Visit (HOSPITAL_COMMUNITY): Payer: Self-pay

## 2024-04-24 NOTE — Telephone Encounter (Signed)
 Pharmacy Patient Advocate Encounter   Received notification from CoverMyMeds that prior authorization for Nortriptyline  HCl 50MG  capsules is required/requested.   Insurance verification completed.   The patient is insured through [employer] .   Per test claim: PA required; PA submitted to above mentioned insurance via CoverMyMeds Key/confirmation #/EOC [id number] Status is pending

## 2024-04-25 NOTE — Telephone Encounter (Signed)
 Pharmacy Patient Advocate Encounter  Received notification from [facility] that Prior Authorization for Nortriptyline  HCl 50MG  capsule has been CANCELLED due to this medication is on patient formulary and does not require a prior authorization. Please have filling pharmacy contact the pharmacy services number if need assistance in billing.   PA #/Case ID/Reference #: [id number]

## 2024-04-29 ENCOUNTER — Encounter: Admitting: Registered Nurse

## 2024-04-29 ENCOUNTER — Encounter: Payer: Self-pay | Admitting: Registered Nurse

## 2024-04-29 VITALS — BP 129/83 | HR 107 | Ht 63.0 in | Wt 212.0 lb

## 2024-04-29 DIAGNOSIS — G894 Chronic pain syndrome: Secondary | ICD-10-CM

## 2024-04-29 DIAGNOSIS — R000 Tachycardia, unspecified: Secondary | ICD-10-CM | POA: Diagnosis not present

## 2024-04-29 DIAGNOSIS — G8929 Other chronic pain: Secondary | ICD-10-CM

## 2024-04-29 DIAGNOSIS — M5441 Lumbago with sciatica, right side: Secondary | ICD-10-CM

## 2024-04-29 NOTE — Progress Notes (Signed)
 Subjective:    Patient ID: Beverly Tran, female    DOB: 11-01-1970, 53 y.o.   MRN: [id number]  HPI: Beverly Tran is a 53 y.o. female who returns for follow up appointment for chronic pain and medication refill. She states her pain is located in her lower back and occasionally radiating into her right lower extremity with activity. She rates her pain 10. Her current exercise regime is walking and performing stretching exercises.  Dr. Emeline note was reviewed, PMP was reviewed, in the past she was prescribed clonazepam  since 07/15/2021, Lorazepam  on 09/24/20222 and diazepam  on 07/06/21, search was reviewed  for the last 5 years. The above will be discussed with Dr Emeline.  Ill await Dr Emeline input regarding the above, and her recommendations. Beverly Tran is aware of the above and verbalizes understanding.   She arrived to office tachycardic, apical pulse checked and medication list was reviewed. She refuses ED or Urgent Care evaluation, she will keep a vital journal and follow up with her PCP. She has a scheduled appointment with cardiology next month.      Pain Inventory Average Pain 6 Pain Right Now 10 My pain is sharp, dull, and aching  In the last 24 hours, has pain interfered with the following? General activity 4 Relation with others 2 Enjoyment of life 2 What TIME of day is your pain at its worst? morning  and night Sleep (in general) Fair  Pain is worse with: walking, bending, standing, and some activites Pain improves with: rest, heat/ice, and therapy/exercise Relief from Meds: .  Family History  Problem Relation Age of Onset   Seizures Mother    Heart failure Mother    Heart failure Father    Colon cancer Neg Hx    Colon polyps Neg Hx    Social History   Socioeconomic History   Marital status: Divorced    Spouse name: Not on file   Number of children: Not on file   Years of education: Not on file   Highest education level: Not on file   Occupational History   Not on file  Tobacco Use   Smoking status: Some Days    Current packs/day: 0.00    Average packs/day: 1 pack/day for 7.0 years (7.0 ttl pk-yrs)    Types: Cigarettes    Start date: 02/02/2013    Last attempt to quit: 02/03/2020    Years since quitting: 4.2   Smokeless tobacco: Never  Vaping Use   Vaping status: Never Used  Substance and Sexual Activity   Alcohol use: No   Drug use: No   Sexual activity: Yes    Birth control/protection: Surgical  Other Topics Concern   Not on file  Social History Narrative   Are you right handed or left handed? Right handed    Are you currently employed ? 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 no   Who lives with you? Husband    What type of home do you live in: 1 story or 2 story?  Apartment        Social Drivers of Health   Financial Resource Strain: Low Risk  (10/05/2023)   Received from [facility]   Overall Financial Resource Strain (CARDIA)    Difficulty of Paying Living Expenses: Not very hard  Food Insecurity: Low Risk  (03/13/2024)   Received from [facility]   Hunger Vital Sign    Within the  past 12 months, you worried that your food would run out before you got money to buy more: Never true    Within the past 12 months, the food you bought just didn't last and you didn't have money to get more. : Never true  Transportation Needs: No Transportation Needs (03/13/2024)   Received from [facility]    In the past 12 months, has lack of reliable transportation kept you from medical appointments, meetings, work or from getting things needed for daily living? : No  Physical Activity: Inactive (10/05/2023)   Received from [facility]   Exercise Vital Sign    On average, how many days per week do you engage in moderate to strenuous exercise (like a brisk walk)?: 0 days    On average, how many minutes do you engage in exercise at this level?: 0 min  Stress:  No Stress Concern Present (10/05/2023)   Received from [facility] - Occupational Stress Questionnaire    Feeling of Stress : Not at all  Social Connections: Unknown (03/01/2022)   Received from [facility]   Social Network    Social Network: Not on file   Past Surgical History:  Procedure Laterality Date   APPENDECTOMY     CESAREAN SECTION     CHOLECYSTECTOMY     COLONOSCOPY  2003   poor prep, grossly normal rectum, colon, and TI. Path not available.    ESOPHAGOGASTRODUODENOSCOPY  2003   Dr. Shaaron:  normal esophagus, stomach, duodenum s/p small bowel biopsy.    HERNIA REPAIR     TEE WITHOUT CARDIOVERSION N/A 03/03/2022   Procedure: TRANSESOPHAGEAL ECHOCARDIOGRAM (TEE);  Surgeon: Pietro Redell RAMAN, MD;  Location: [facility] ENDOSCOPY;  Service: Cardiovascular;  Laterality: N/A;   TUBAL LIGATION     Past Surgical History:  Procedure Laterality Date   APPENDECTOMY     CESAREAN SECTION     CHOLECYSTECTOMY     COLONOSCOPY  2003   poor prep, grossly normal rectum, colon, and TI. Path not available.    ESOPHAGOGASTRODUODENOSCOPY  2003   Dr. Shaaron:  normal esophagus, stomach, duodenum s/p small bowel biopsy.    HERNIA REPAIR     TEE WITHOUT CARDIOVERSION N/A 03/03/2022   Procedure: TRANSESOPHAGEAL ECHOCARDIOGRAM (TEE);  Surgeon: Pietro Redell RAMAN, MD;  Location: [facility] ENDOSCOPY;  Service: Cardiovascular;  Laterality: N/A;   TUBAL LIGATION     Past Medical History:  Diagnosis Date   Anxiety    Arthritis    Asthma    Back pain, chronic    Depression    Diabetes mellitus without complication (HCC)    GERD (gastroesophageal reflux disease)    High cholesterol    Hypertension    IBS (irritable bowel syndrome)    Migraine    BP 129/83 (BP Location: Right Arm, Patient Position: Sitting, Cuff Size: Large)   Pulse (!) 107   Ht 5' 3 (1.6 m)   Wt 212 lb (96.2 kg)   LMP 10/13/2015   SpO2 (!) 89%   BMI 37.55 kg/m   Opioid Risk Score:   Fall  Risk Score:  `1  Depression screen [facility] 2/9     03/20/2024   11:25 AM 03/17/2022    9:35 AM  Depression screen PHQ 2/9  Decreased Interest 0 0  Down, Depressed, Hopeless 0 1  PHQ - 2 Score 0 1  Altered sleeping 1   Tired, decreased energy 0   Change in  appetite 0   Feeling bad or failure about yourself  0   Trouble concentrating 0   Moving slowly or fidgety/restless 1   Suicidal thoughts 0   PHQ-9 Score 2   Difficult doing work/chores Not difficult at all       Review of Systems  Musculoskeletal:  Positive for back pain.       Pain in upper left leg  All other systems reviewed and are negative.      Objective:   Physical Exam Vitals and nursing note reviewed.  Constitutional:      Appearance: Normal appearance. She is obese.  Cardiovascular:     Rate and Rhythm: Tachycardia present.     Pulses: Normal pulses.     Heart sounds: Normal heart sounds.  Pulmonary:     Effort: Pulmonary effort is normal.     Breath sounds: Normal breath sounds.  Musculoskeletal:     Comments: Normal Muscle Bulk and Muscle Testing Reveals:  Upper Extremities: Full ROM and Muscle Strength  5/5  Lumbar Paraspinal Tenderness: L-3-L-5 Lower Extremities : Full ROM and Muscle Strength 5/5 Arises from Table slowly using walker for support Antalgic Gait     Skin:    General: Skin is warm and dry.  Neurological:     Mental Status: She is alert and oriented to person, place, and time.  Psychiatric:        Mood and Affect: Mood normal.        Behavior: Behavior normal.           Assessment & Plan:  Right Lumbar Radiculitis: Continue HEP as tolerated. Continue current medication regimen. Continue to monitor.  Tachycardia: Apical Pulse checked. She refuses Ed or Urgent Care evaluation. She will keep a vital journal and follow up with her PCP, she verbalizes understanding. She has a scheduled appointment with Cardiology next month.  Chronic Pain Syndrome: Will discuss with Dr Emeline,  regarding recommendation. Beverly Tran is aware and this provider will give her a call with Dr Emeline recommendation. She verbalizes understanding.   F/U in 2 months

## 2024-04-30 NOTE — Progress Notes (Deleted)
 CARDIOLOGY CONSULT NOTE       Patient ID: Beverly Tran MRN: [id number] DOB/AGE: October 21, 1970 53 y.o.  Admit date: (Not on file) Referring Physician: Bucio Primary Physician: Bucio, Elsa C, FNP Primary Cardiologist: New Reason for Consultation: CHF  Active Problems:   * No active hospital problems. *   HPI:  53 y.o. referred by DR Bucio for cardiomegaly and dyspnea. Noted CXR done [facility] 02/26/24 with hazy opacity and interstitial thickening with small bilateral effusions. CTA with no PE also consistent with CHF Echo with normal LV size and wall thickness EF >55% RV normal no significant valve abnormalities  Pro BNP mildly elevated 750 Hct and Cr normal Poorly controlled diabetic with A1c 10.6, LDL is 13 with TC 82 TSH normal She has oxygen dependant COPD with asthma and frequent pneumonia  Seen by pulmonary Dr Deatrice Campanile 03/13/24 Noted chronic 4L [place] oxygen She is a current every day smoker  ***  ROS All other systems reviewed and negative except as noted above  Past Medical History:  Diagnosis Date   Anxiety    Arthritis    Asthma    Back pain, chronic    Depression    Diabetes mellitus without complication (HCC)    GERD (gastroesophageal reflux disease)    High cholesterol    Hypertension    IBS (irritable bowel syndrome)    Migraine     Family History  Problem Relation Age of Onset   Seizures Mother    Heart failure Mother    Heart failure Father    Colon cancer Neg Hx    Colon polyps Neg Hx     Social History   Socioeconomic History   Marital status: Divorced    Spouse name: Not on file   Number of children: Not on file   Years of education: Not on file   Highest education level: Not on file  Occupational History   Not on file  Tobacco Use   Smoking status: Some Days    Current packs/day: 0.00    Average packs/day: 1 pack/day for 7.0 years (7.0 ttl pk-yrs)    Types: Cigarettes    Start date: 02/02/2013    Last attempt to quit: 02/03/2020    Years  since quitting: 4.2   Smokeless tobacco: Never  Vaping Use   Vaping status: Never Used  Substance and Sexual Activity   Alcohol use: No   Drug use: No   Sexual activity: Yes    Birth control/protection: Surgical  Other Topics Concern   Not on file  Social History Narrative   Are you right handed or left handed? Right handed    Are you currently employed ? 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 no   Who lives with you? Husband    What type of home do you live in: 1 story or 2 story?  Apartment        Social Drivers of Health   Financial Resource Strain: Low Risk  (10/05/2023)   Received from [facility]   Overall Financial Resource Strain (CARDIA)    Difficulty of Paying Living Expenses: Not very hard  Food Insecurity: Low Risk  (03/13/2024)   Received from [facility]   Hunger Vital Sign    Within the past 12 months, you worried that your food would run out before you got money to buy more: Never true    Within the past 12 months, the  food you bought just didn't last and you didn't have money to get more. : Never true  Transportation Needs: No Transportation Needs (03/13/2024)   Received from [facility]   Transportation    In the past 12 months, has lack of reliable transportation kept you from medical appointments, meetings, work or from getting things needed for daily living? : No  Physical Activity: Inactive (10/05/2023)   Received from [facility]   Exercise Vital Sign    On average, how many days per week do you engage in moderate to strenuous exercise (like a brisk walk)?: 0 days    On average, how many minutes do you engage in exercise at this level?: 0 min  Stress: No Stress Concern Present (10/05/2023)   Received from [facility] - Occupational Stress Questionnaire    Feeling of Stress : Not at all  Social Connections: Unknown (03/01/2022)   Received from [facility]    Social Network    Social Network: Not on file  Intimate Partner Violence: Not At Risk (12/26/2022)   Humiliation, Afraid, Rape, and Kick questionnaire    Fear of Current or Ex-Partner: No    Emotionally Abused: No    Physically Abused: No    Sexually Abused: No    Past Surgical History:  Procedure Laterality Date   APPENDECTOMY     CESAREAN SECTION     CHOLECYSTECTOMY     COLONOSCOPY  2003   poor prep, grossly normal rectum, colon, and TI. Path not available.    ESOPHAGOGASTRODUODENOSCOPY  2003   Dr. Shaaron:  normal esophagus, stomach, duodenum s/p small bowel biopsy.    HERNIA REPAIR     TEE WITHOUT CARDIOVERSION N/A 03/03/2022   Procedure: TRANSESOPHAGEAL ECHOCARDIOGRAM (TEE);  Surgeon: Pietro Redell RAMAN, MD;  Location: [facility] ENDOSCOPY;  Service: Cardiovascular;  Laterality: N/A;   TUBAL LIGATION        Current Outpatient Medications:    albuterol  (VENTOLIN  HFA) 108 (90 Base) MCG/ACT inhaler, Inhale 2 puffs into the lungs every 4 (four) hours as needed for wheezing or shortness of breath., Disp: 18 g, Rfl: 1   clonazePAM  (KLONOPIN ) 0.5 MG tablet, Take 1 tablet (0.5 mg total) by mouth 2 (two) times daily., Disp: 60 tablet, Rfl: 5   EPINEPHrine 0.3 mg/0.3 mL IJ SOAJ injection, Inject 0.3 mg into the muscle as needed for anaphylaxis., Disp: , Rfl:    famotidine  (PEPCID ) 20 MG tablet, Take 1 tablet (20 mg total) by mouth daily. (Patient taking differently: Take 40 mg by mouth daily.), Disp: 30 tablet, Rfl: 2   gabapentin  (NEURONTIN ) 300 MG capsule, Take 2 capsules (600 mg total) by mouth 3 (three) times daily., Disp: 180 capsule, Rfl: 5   ipratropium-albuterol  (DUONEB) 0.5-2.5 (3) MG/3ML SOLN, Take 3 mLs by nebulization as needed (shortness of breath or wheezing)., Disp: , Rfl:    JARDIANCE 10 MG TABS tablet, Take 10 mg by mouth daily., Disp: , Rfl:    LANTUS SOLOSTAR 100 UNIT/ML Solostar Pen, Inject 25 Units into the skin 2 (two) times daily., Disp: , Rfl:    metFORMIN  (GLUCOPHAGE ) 500 MG  tablet, Take 1,000 mg by mouth 2 (two) times daily with a meal., Disp: , Rfl:    MOUNJARO 7.5 MG/0.5ML Pen, Inject 7.5 mg into the skin once a week., Disp: , Rfl:    naloxone (NARCAN) nasal spray 4 mg/0.1 mL, Place 0.4 mg into the nose once., Disp: , Rfl:  nortriptyline  (PAMELOR ) 50 MG capsule, Take 2 capsules every night, Disp: 60 capsule, Rfl: 11   olmesartan -hydrochlorothiazide  (BENICAR  HCT) 20-12.5 MG tablet, Take 1 tablet by mouth daily., Disp: , Rfl:    oxyCODONE -acetaminophen  (PERCOCET) 10-325 MG tablet, Take 1 tablet by mouth every 6 (six) hours as needed for pain. (Patient not taking: Reported on 12/08/2023), Disp: , Rfl:    pantoprazole  (PROTONIX ) 40 MG tablet, TAKE ONE TABLET BY MOUTH DAILY (MORNING) (Patient taking differently: Take 40 mg by mouth daily.), Disp: 30 tablet, Rfl: 5   pramipexole  (MIRAPEX ) 1 MG tablet, Take 1/2 tablet 2 hours before bedtime, then 1/2 tablet at bedtime, Disp: 90 tablet, Rfl: 3   rosuvastatin  (CRESTOR ) 10 MG tablet, Take 10 mg by mouth at bedtime., Disp: , Rfl:    SYMBICORT  80-4.5 MCG/ACT inhaler, Take 2 puffs first thing in am and then another 2 puffs about 12 hours later., Disp: 10.2 g, Rfl: 11    Physical Exam: Last menstrual period 10/13/2015.    Affect appropriate Healthy:  appears stated age HEENT: normal Neck supple with no adenopathy JVP normal no bruits no thyromegaly Lungs clear with no wheezing and good diaphragmatic motion Heart:  S1/S2 no murmur, no rub, gallop or click PMI normal Abdomen: benighn, BS positve, no tenderness, no AAA no bruit.  No HSM or HJR Distal pulses intact with no bruits No edema Neuro non-focal Skin warm and dry No muscular weakness   Labs:   Lab Results  Component Value Date   WBC 8.1 01/01/2023   HGB 12.9 01/01/2023   HCT 39.0 01/01/2023   MCV 97.3 01/01/2023   PLT 158 01/01/2023   No results for input(s): NA, K, CL, CO2, BUN, CREATININE, CALCIUM , PROT, BILITOT, ALKPHOS, ALT,  AST, GLUCOSE in the last 168 hours.  Invalid input(s): LABALBU Lab Results  Component Value Date   CKTOTAL 202 12/30/2022   No results found for: CHOL No results found for: HDL No results found for: LDLCALC No results found for: TRIG No results found for: CHOLHDL No results found for: LDLDIRECT    Radiology: No results found.  EKG: ***   ASSESSMENT AND PLAN:   CHF:  recent evaluation at [facility]. BNP elevated with vascular congestion on CTA/CXR. No CE and TTE with normal EF and no valve dx. Suspect diastolic dysfunction due to HTN and poorly controlled DM> *** HTN:  *** DM:  *** COPD/Asthma:  f/u pulmonary d/c smoking Use inhalers Sleep study ans OSA   Signed: Maude Emmer 04/30/2024, 4:36 PM

## 2024-05-03 ENCOUNTER — Ambulatory Visit (HOSPITAL_BASED_OUTPATIENT_CLINIC_OR_DEPARTMENT_OTHER): Admitting: Physical Therapy

## 2024-05-06 ENCOUNTER — Telehealth: Payer: Self-pay | Admitting: Neurology

## 2024-05-06 NOTE — Telephone Encounter (Signed)
 Pt. Having stroke issues, heart rate was high and was referred to hospital with one fall overnight stay and would like to speak about a possible appt

## 2024-05-07 ENCOUNTER — Inpatient Hospital Stay: Admission: RE | Admit: 2024-05-07 | Source: Ambulatory Visit

## 2024-05-07 ENCOUNTER — Other Ambulatory Visit

## 2024-05-07 ENCOUNTER — Telehealth: Payer: Self-pay | Admitting: *Deleted

## 2024-05-07 NOTE — Telephone Encounter (Signed)
 Left message on machine for patient to call back.

## 2024-05-07 NOTE — Telephone Encounter (Signed)
 Pt called stated--went to ED for mini -stroke and heat exhaustion 05/02/24--both hands tremors, legs weakness, and Stuttering. Pt stated ED change the Gabapentin  300mg  1 capsule BID, and nortriptyline  50mg  1 capsule at bedtime. Pt stated needed f/u hosp f/u. Please advise

## 2024-05-07 NOTE — Telephone Encounter (Signed)
 Already routed to Dr. Georjean

## 2024-05-09 ENCOUNTER — Ambulatory Visit: Admitting: Cardiovascular Disease

## 2024-05-13 ENCOUNTER — Encounter: Payer: Self-pay | Admitting: Cardiology

## 2024-05-13 ENCOUNTER — Ambulatory Visit: Attending: Cardiology | Admitting: Cardiology

## 2024-05-13 VITALS — BP 124/70 | HR 89 | Ht 63.0 in | Wt 210.4 lb

## 2024-05-13 DIAGNOSIS — R0602 Shortness of breath: Secondary | ICD-10-CM | POA: Diagnosis not present

## 2024-05-13 DIAGNOSIS — Z79899 Other long term (current) drug therapy: Secondary | ICD-10-CM | POA: Insufficient documentation

## 2024-05-13 DIAGNOSIS — I10 Essential (primary) hypertension: Secondary | ICD-10-CM | POA: Insufficient documentation

## 2024-05-13 MED ORDER — HYDROCHLOROTHIAZIDE 12.5 MG PO CAPS
12.5000 mg | ORAL_CAPSULE | Freq: Every day | ORAL | 6 refills | Status: AC
Start: 2024-05-13 — End: ?

## 2024-05-13 MED ORDER — FAMOTIDINE 40 MG PO TABS: 40.0000 mg | ORAL_TABLET | Freq: Every day | ORAL | Status: AC

## 2024-05-13 NOTE — Patient Instructions (Addendum)
 Medication Instructions:   Stop Benicar  - Hydrochlorothiazide  Begin Hydrochlorothiazide  12.5mg  daily  Continue all other medications.     Labwork:  BMET, Mg - orders given today Please do in 2 weeks Office will contact with results via phone, letter or mychart.     Testing/Procedures:  none  Follow-Up:  6 months   Any Other Special Instructions Will Be Listed Below (If Applicable).   If you need a refill on your cardiac medications before your next appointment, please call your pharmacy.

## 2024-05-13 NOTE — Progress Notes (Signed)
 Dorn PHEBE Ross, M.D., F.A.C.C.     Clinical Summary Beverly Tran is a 53 y.o.female seen today as a new consult, referred by NP Bucio for the following medical problems.   1.SOB -admission 02/2024 to [facility] with SOb - from notes history of COPD on 4L [place]. Diagnosed with pneumonia, COPD exacerbation - BNP was elevated 750 during admission - CT PE negative for PE, + pulm edema and consolidative changes - 02/2024 echo: LVEF > 55%, normal diastolic fxn, normal RV function - 04/2024 CXR mild interstitial edema.   - can have some LE edema - recently breathing has been doing as well - no specific orthopnea or PND   2. CVA - followed by pcp  Past Medical History:  Diagnosis Date   Anxiety    Arthritis    Asthma    Back pain, chronic    Depression    Diabetes mellitus without complication (HCC)    GERD (gastroesophageal reflux disease)    High cholesterol    Hypertension    IBS (irritable bowel syndrome)    Migraine      Allergies  Allergen Reactions   Bee Venom Anaphylaxis and Swelling    WASP/HORNET   Diclofenac Anaphylaxis   Salami [Pickled Meat] Shortness Of Breath and Swelling    Also Allergic to POPCORN: same reaction   Aspirin Other (See Comments)    Nose bleeds   Compazine [Prochlorperazine] Hives   Imitrex [Sumatriptan] Hives   Lamotrigine Other (See Comments)    confusion   Lisinopril      Unknown   Nubain [Nalbuphine Hcl] Swelling and Other (See Comments)    Eye swelling   Thorazine [Chlorpromazine] Hives   Topamax [Topiramate] Other (See Comments)    Confusion and out of it   Voltaren [Diclofenac Sodium] Swelling   Zofran  [Ondansetron  Hcl] Nausea Only     Current Outpatient Medications  Medication Sig Dispense Refill   albuterol  (VENTOLIN  HFA) 108 (90 Base) MCG/ACT inhaler Inhale 2 puffs into the lungs every 4 (four) hours as needed for wheezing or shortness of breath. 18 g 1   clonazePAM  (KLONOPIN ) 0.5 MG tablet Take 1 tablet (0.5  mg total) by mouth 2 (two) times daily. 60 tablet 5   EPINEPHrine 0.3 mg/0.3 mL IJ SOAJ injection Inject 0.3 mg into the muscle as needed for anaphylaxis.     famotidine  (PEPCID ) 20 MG tablet Take 1 tablet (20 mg total) by mouth daily. (Patient taking differently: Take 40 mg by mouth daily.) 30 tablet 2   gabapentin  (NEURONTIN ) 300 MG capsule Take 2 capsules (600 mg total) by mouth 3 (three) times daily. 180 capsule 5   ipratropium-albuterol  (DUONEB) 0.5-2.5 (3) MG/3ML SOLN Take 3 mLs by nebulization as needed (shortness of breath or wheezing).     JARDIANCE 10 MG TABS tablet Take 10 mg by mouth daily.     LANTUS SOLOSTAR 100 UNIT/ML Solostar Pen Inject 25 Units into the skin 2 (two) times daily.     metFORMIN  (GLUCOPHAGE ) 500 MG tablet Take 1,000 mg by mouth 2 (two) times daily with a meal.     MOUNJARO 7.5 MG/0.5ML Pen Inject 7.5 mg into the skin once a week.     naloxone (NARCAN) nasal spray 4 mg/0.1 mL Place 0.4 mg into the nose once.     nortriptyline  (PAMELOR ) 50 MG capsule Take 2 capsules every night 60 capsule 11   olmesartan -hydrochlorothiazide  (BENICAR  HCT) 20-12.5 MG tablet Take 1 tablet by mouth daily.  oxyCODONE -acetaminophen  (PERCOCET) 10-325 MG tablet Take 1 tablet by mouth every 6 (six) hours as needed for pain. (Patient not taking: Reported on 12/08/2023)     pantoprazole  (PROTONIX ) 40 MG tablet TAKE ONE TABLET BY MOUTH DAILY (MORNING) (Patient taking differently: Take 40 mg by mouth daily.) 30 tablet 5   pramipexole  (MIRAPEX ) 1 MG tablet Take 1/2 tablet 2 hours before bedtime, then 1/2 tablet at bedtime 90 tablet 3   rosuvastatin  (CRESTOR ) 10 MG tablet Take 10 mg by mouth at bedtime.     SYMBICORT  80-4.5 MCG/ACT inhaler Take 2 puffs first thing in am and then another 2 puffs about 12 hours later. 10.2 g 11   No current facility-administered medications for this visit.     Past Surgical History:  Procedure Laterality Date   APPENDECTOMY     CESAREAN SECTION      CHOLECYSTECTOMY     COLONOSCOPY  2003   poor prep, grossly normal rectum, colon, and TI. Path not available.    ESOPHAGOGASTRODUODENOSCOPY  2003   Dr. Shaaron:  normal esophagus, stomach, duodenum s/p small bowel biopsy.    HERNIA REPAIR     TEE WITHOUT CARDIOVERSION N/A 03/03/2022   Procedure: TRANSESOPHAGEAL ECHOCARDIOGRAM (TEE);  Surgeon: Pietro Redell RAMAN, MD;  Location: [facility] ENDOSCOPY;  Service: Cardiovascular;  Laterality: N/A;   TUBAL LIGATION       Allergies  Allergen Reactions   Bee Venom Anaphylaxis and Swelling    WASP/HORNET   Diclofenac Anaphylaxis   Salami [Pickled Meat] Shortness Of Breath and Swelling    Also Allergic to POPCORN: same reaction   Aspirin Other (See Comments)    Nose bleeds   Compazine [Prochlorperazine] Hives   Imitrex [Sumatriptan] Hives   Lamotrigine Other (See Comments)    confusion   Lisinopril      Unknown   Nubain [Nalbuphine Hcl] Swelling and Other (See Comments)    Eye swelling   Thorazine [Chlorpromazine] Hives   Topamax [Topiramate] Other (See Comments)    Confusion and out of it   Voltaren [Diclofenac Sodium] Swelling   Zofran  [Ondansetron  Hcl] Nausea Only      Family History  Problem Relation Age of Onset   Seizures Mother    Heart failure Mother    Heart failure Father    Colon cancer Neg Hx    Colon polyps Neg Hx      Social History Beverly Tran reports that she has been smoking cigarettes. She started smoking about 11 years ago. She has a 7 pack-year smoking history. She has never used smokeless tobacco. Beverly Tran reports no history of alcohol use.    Physical Examination Today's Vitals   05/13/24 0849  BP: 124/70  Pulse: 89  SpO2: 96%  Weight: 210 lb 6.4 oz (95.4 kg)  Height: 5' 3 (1.6 m)   Body mass index is 37.27 kg/m.  Gen: resting comfortably, no acute distress HEENT: no scleral icterus, pupils equal round and reactive, no palptable cervical adenopathy,  CV: RRR, no m/rg, no jvd Resp: Clear to  auscultation bilaterally GI: abdomen is soft, non-tender, non-distended, normal bowel sounds, no hepatosplenomegaly MSK: extremities are warm, no edema.  Skin: warm, no rash Neuro:  no focal deficits Psych: appropriate affect    Assessment and Plan  1.SOB - appears to be primarily lung related given her O2 dependent COPD - at prior admissions some evidence of pulmonary edema by imaging, elevated bnp. Mild edema by recent f/u CXR - 02/2024 echo at [facility] was benign -  does have some occasoinal LE edema at times thought not present today - would start low dose diuretic. She stopper her benica HCT due to bp's being at goal, start back the hydrochlorothiazide  12.5mg  daily component and check a bmet/mg in 2 weeks - EKG today shows NSR, no ischemic changes  2. HTN - at goal, she stopped her benicar  HCT previously  - add back hydrochlorothiazide  12.5mg  daily as outlined above  F/u 6 months      Dorn PHEBE Ross, M.D.

## 2024-05-17 NOTE — Telephone Encounter (Signed)
Ok for waitlist, thanks

## 2024-05-23 NOTE — Progress Notes (Signed)
 This encounter was created in error - please disregard.

## 2024-05-25 ENCOUNTER — Other Ambulatory Visit: Payer: Self-pay | Admitting: Neurology

## 2024-06-05 ENCOUNTER — Ambulatory Visit
Admission: RE | Admit: 2024-06-05 | Discharge: 2024-06-05 | Disposition: A | Discharge status: Home / Self Care | Source: Ambulatory Visit | Attending: Physical Medicine and Rehabilitation | Admitting: Physical Medicine and Rehabilitation

## 2024-06-05 ENCOUNTER — Inpatient Hospital Stay
Admission: RE | Admit: 2024-06-05 | Discharge: 2024-06-05 | Discharge status: Home / Self Care | Source: Ambulatory Visit | Attending: Physical Medicine and Rehabilitation

## 2024-06-05 DIAGNOSIS — R230 Cyanosis: Secondary | ICD-10-CM

## 2024-06-10 ENCOUNTER — Ambulatory Visit: Admitting: Internal Medicine

## 2024-06-11 ENCOUNTER — Ambulatory Visit: Payer: Self-pay | Admitting: Physical Medicine and Rehabilitation

## 2024-06-24 ENCOUNTER — Telehealth: Payer: Self-pay | Admitting: Physical Medicine and Rehabilitation

## 2024-06-24 NOTE — Telephone Encounter (Signed)
 Patient called stating she in in an extreme amount of pain and would like to speak with a nurse

## 2024-07-05 ENCOUNTER — Ambulatory Visit: Payer: Medicaid Other | Admitting: Neurology

## 2024-07-08 ENCOUNTER — Encounter: Attending: Registered Nurse | Admitting: Physical Medicine and Rehabilitation

## 2024-07-08 ENCOUNTER — Encounter: Payer: Self-pay | Admitting: Physical Medicine and Rehabilitation

## 2024-07-08 VITALS — BP 137/84 | HR 108 | Ht 63.0 in | Wt 205.2 lb

## 2024-07-08 DIAGNOSIS — M545 Low back pain, unspecified: Secondary | ICD-10-CM | POA: Diagnosis present

## 2024-07-08 DIAGNOSIS — M5441 Lumbago with sciatica, right side: Secondary | ICD-10-CM | POA: Diagnosis present

## 2024-07-08 DIAGNOSIS — M7062 Trochanteric bursitis, left hip: Secondary | ICD-10-CM | POA: Diagnosis not present

## 2024-07-08 DIAGNOSIS — G894 Chronic pain syndrome: Secondary | ICD-10-CM | POA: Diagnosis present

## 2024-07-08 DIAGNOSIS — G8929 Other chronic pain: Secondary | ICD-10-CM | POA: Insufficient documentation

## 2024-07-08 MED ORDER — TRAMADOL HCL 50 MG PO TABS: 50.0000 mg | ORAL_TABLET | Freq: Two times a day (BID) | ORAL | 2 refills | Status: DC | PRN

## 2024-07-08 NOTE — Patient Instructions (Addendum)
 Follow up in 1-2 weeks for left greater trochanteric bursa injection. You can also use heat on your hip if needed.   I will look into aquatherapy in [place] to work on your back and legs; if not, we will send you to regular PT  I will prescribe 1 month of tramadol  50 mg twice daily as needed   Continue gabapentin , flexeril , and meloxicam  Follow up via telehealth in 1  month.

## 2024-07-08 NOTE — Progress Notes (Signed)
 Subjective:    Patient ID: Beverly Tran, female    DOB: 06-17-1971, 53 y.o.   MRN: [id number]  HPI   Beverly Tran is a 53 y.o. year old female  who  has a past medical history of Anxiety, Arthritis, Asthma, Back pain, chronic, Depression, Diabetes mellitus without complication (HCC), GERD (gastroesophageal reflux disease), High cholesterol, Hypertension, IBS (irritable bowel syndrome), and Migraine.   They are presenting to PM&R clinic for follow up related to bilateral low back and hip, extending into RIGHT posterior thigh.  .  Plan from last visit:  Chronic pain syndrome Encounter for opiate analgesic use agreement -     Drug Tox Alc Metab w/Con, Oral Fld -     Drug Tox Monitor 1 w/Conf, Oral Fld   I am re-ordering you gabapentin  at prior 600 mg three times daily dosing for 6 months; I have reviewed your renal function tests from last month and they look OK for this dose.   Patient has failed duloxetine , tylenol , and lyrica . I would stop advil/ibuprofen due to recent concerns of heart disease.   We discussed starting low dose naltrexone vs. Butrans patch starting at 5 mcg and increasing as needed; patient has opted for butrans patch. I will send a script for this once we have gotten drug screening results AND records from [place] confirming you were not discharged from their practice.      Follow-up with my nurse practitioner Beverly Tran in 1 month.  If pain is well-controlled on your current regimen, you will see her every other month and me every 6 months.  If not, we will follow-up more frequently.  Feel free to use MyChart between appointments to discuss any acute issues, making usually get back to within 48 hours.    Chronic bilateral low back pain with right-sided sciatica MRI lumbar spine 2022 reviewed IMPRESSION: 1. Degenerative changes of the lumbar spine, more pronounced at the level of the facet joints at L4-5 where there is mild spinal canal stenosis, mild right  and moderate left neural foraminal narrowing.   2. Mild bilateral neural foraminal narrowing at L3-4.   We discussed how epidural steroid injections are likely to give you the most benefit, however patient declines referral at this time due to fear of needles   Myofascial low back pain   We discussed trigger point injections for myofascial pain; patient will think about this and re-address next visit.   I am referring you back to aquatherapy for gentle ROM and increased activity tolerance.   Extremity cyanosis -     US  ARTERIAL LOWER EXTREMITY DUPLEX BILATERAL; Future   I am getting an ABI to test for vascular disease in your legs.      Interval Hx:  - Therapies: She is starting to go to the gym slowly on good days; she is going an hour 2 days a week. Usually biking. She is doing 5 lb dumbbells as well.    - Follow ups:   Bilateral ABIs negative.   Dr. Georjean managing pramipexole  for restless legs and neuropathy - patient frustrated she will not increase this.   Her sleep doctor recently took her off of seroquel  and replaced with with Ambien because Seroquel  aggrivates her restless legs  She was hospitalized with encephalopathy 05/02/24 for 1 day; was due to pneumonia    - Falls:none   - DME: none   - Medications:   Pamelor  50 mg at bedtime  Zolpidem 5 mg at bedtime +  clonazepam  - both with current scripts; she says these are for anxiety and muscle spasms - has gotten through Neurology Dr. Georjean   Gabapentin  600 mg TID  -  was stopped when she was in the hospital but she resumed.   Still taking ibuprofen for pain PRN 4 times daily per her PCP  Flexeril  10 mg using TID   - Other concerns: She says since last visit, pain has worsened. Her main pain is in her left hip and lower back - usually her pain is on the right - started a week and a half ago on the left. No trauma.    MRI Lumbar spine 03/2021: IMPRESSION: 1. Degenerative changes of the lumbar spine, more  pronounced at the level of the facet joints at L4-5 where there is mild spinal canal stenosis, mild right and moderate left neural foraminal narrowing. 2. Mild bilateral neural foraminal narrowing at L3-4.   Pain Inventory Average Pain 7 Pain Right Now 9 My pain is sharp, dull, and aching  In the last 24 hours, has pain interfered with the following? General activity 6 Relation with others 3 Enjoyment of life 2 What TIME of day is your pain at its worst? daytime and night Sleep (in general) NA  Pain is worse with: walking and standing Pain improves with: medication Relief from Meds: 7  Family History  Problem Relation Age of Onset   Seizures Mother    Heart failure Mother    Heart failure Father    Colon cancer Neg Hx    Colon polyps Neg Hx    Social History   Socioeconomic History   Marital status: Divorced    Spouse name: Not on file   Number of children: Not on file   Years of education: Not on file   Highest education level: Not on file  Occupational History   Not on file  Tobacco Use   Smoking status: Some Days    Current packs/day: 0.00    Average packs/day: 1 pack/day for 7.0 years (7.0 ttl pk-yrs)    Types: Cigarettes    Start date: 02/02/2013    Last attempt to quit: 02/03/2020    Years since quitting: 4.4   Smokeless tobacco: Never  Vaping Use   Vaping status: Never Used  Substance and Sexual Activity   Alcohol use: No   Drug use: No   Sexual activity: Yes    Birth control/protection: Surgical  Other Topics Concern   Not on file  Social History Narrative   Are you right handed or left handed? Right handed    Are you currently employed ? 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 no   Who lives with you? Husband    What type of home do you live in: 1 story or 2 story?  Apartment        Social Drivers of Health   Financial Resource Strain: Low Risk  (10/05/2023)   Received from [facility]   Overall Financial  Resource Strain (CARDIA)    Difficulty of Paying Living Expenses: Not very hard  Food Insecurity: Low Risk  (05/31/2024)   Received from [facility]   Hunger Vital Sign    Within the past 12 months, you worried that your food would run out before you got money to buy more: Never true    Within the past 12 months, the food you bought just didn't last and you didn't have  money to get more. : Never true  Transportation Needs: No Transportation Needs (05/31/2024)   Received from [facility]    In the past 12 months, has lack of reliable transportation kept you from medical appointments, meetings, work or from getting things needed for daily living? : No  Physical Activity: Inactive (10/05/2023)   Received from [facility]   Exercise Vital Sign    On average, how many days per week do you engage in moderate to strenuous exercise (like a brisk walk)?: 0 days    On average, how many minutes do you engage in exercise at this level?: 0 min  Stress: No Stress Concern Present (10/05/2023)   Received from [facility] - Occupational Stress Questionnaire    Feeling of Stress : Not at all  Social Connections: Unknown (03/01/2022)   Received from [facility]   Social Network    Social Network: Not on file   Past Surgical History:  Procedure Laterality Date   APPENDECTOMY     CESAREAN SECTION     CHOLECYSTECTOMY     COLONOSCOPY  2003   poor prep, grossly normal rectum, colon, and TI. Path not available.    ESOPHAGOGASTRODUODENOSCOPY  2003   Dr. Shaaron:  normal esophagus, stomach, duodenum s/p small bowel biopsy.    HERNIA REPAIR     TEE WITHOUT CARDIOVERSION N/A 03/03/2022   Procedure: TRANSESOPHAGEAL ECHOCARDIOGRAM (TEE);  Surgeon: Pietro Redell RAMAN, MD;  Location[facility] ENDOSCOPY;  Service: Cardiovascular;  Laterality: N/A;   TUBAL LIGATION     Past Surgical History:  Procedure Laterality Date   APPENDECTOMY     CESAREAN  SECTION     CHOLECYSTECTOMY     COLONOSCOPY  2003   poor prep, grossly normal rectum, colon, and TI. Path not available.    ESOPHAGOGASTRODUODENOSCOPY  2003   Dr. Shaaron:  normal esophagus, stomach, duodenum s/p small bowel biopsy.    HERNIA REPAIR     TEE WITHOUT CARDIOVERSION N/A 03/03/2022   Procedure: TRANSESOPHAGEAL ECHOCARDIOGRAM (TEE);  Surgeon: Pietro Redell RAMAN, MD;  Location: [facility] ENDOSCOPY;  Service: Cardiovascular;  Laterality: N/A;   TUBAL LIGATION     Past Medical History:  Diagnosis Date   Anxiety    Arthritis    Asthma    Back pain, chronic    Depression    Diabetes mellitus without complication (HCC)    GERD (gastroesophageal reflux disease)    High cholesterol    Hypertension    IBS (irritable bowel syndrome)    Migraine    BP 137/84   Pulse (!) 108   Ht 5' 3 (1.6 m)   Wt 205 lb 3.2 oz (93.1 kg)   LMP 10/13/2015   SpO2 93%   BMI 36.35 kg/m   Opioid Risk Score:   Fall Risk Score:  `1  Depression screen [facility] 2/9     07/08/2024   10:44 AM 03/20/2024   11:25 AM 03/17/2022    9:35 AM  Depression screen PHQ 2/9  Decreased Interest 0 0 0  Down, Depressed, Hopeless 0 0 1  PHQ - 2 Score 0 0 1  Altered sleeping  1   Tired, decreased energy  0   Change in appetite  0   Feeling bad or failure about yourself   0   Trouble concentrating  0   Moving slowly or fidgety/restless  1   Suicidal thoughts  0   PHQ-9 Score  2   Difficult doing work/chores  Not difficult at all      Review of Systems  All other systems reviewed and are negative.      Objective:   Physical Exam   PE: Constitution: Appropriate appearance for age. No apparent distress  +Obese Resp: No respiratory distress. No accessory muscle usage. on RA Cardio:  BL LE delayed capillary refill 5 seconds b/l    Abdomen: Nondistended. Nontender.   Psych: Appropriate mood and affect. Neuro: AAOx4. No apparent cognitive deficits  Skin: Changes in bilateral nailbeds   Neurologic Exam:    DTRs: Reflexes were 2+ in bilateral achilles, patella, biceps, BR and triceps. Babinsky: flexor responses b/l.   Hoffmans: negative b/l Sensory exam: revealed normal sensation in all dermatomal regions in bilateral upper extremities and bilateral lower extremities except in L medial malleoli Motor exam: strength 5/5 throughout bilateral upper extremities and bilateral lower extremities Coordination: Fine motor coordination was normal.   Gait:  antalgic, stiff, without asisstive device.    MSK: B back +  TTP L  lumbar paraspinals, L hip/GTB --ongoing - SI joint testing b/l + facet loading L>R bilaterally  + slump L>R bilaterally      Assessment & Plan:   Beverly Tran is a 53 y.o. year old female  who  has a past medical history of Anxiety, Arthritis, Asthma, Back pain, chronic, Depression, Diabetes mellitus without complication (HCC), GERD (gastroesophageal reflux disease), High cholesterol, Hypertension, IBS (irritable bowel syndrome), and Migraine.   They are presenting to PM&R clinic for follow up related to bilateral low back and hip, extending into RIGHT posterior thigh; left trochanteric bursitis.  Chronic pain syndrome Chronic bilateral low back pain with right-sided sciatica Right sided radicular pains now following L4 dermatome  MRI lumbar spine 2022 showing degenerative changes most pronounced at the facet joints of L4-5 with mild spinal canal stenosis, mild right and moderate left neuroforaminal narrowing, and mild bilateral neuroforaminal narrowing at L3-4.  I will prescribe 1 month of tramadol  50 mg twice daily as needed   Continue gabapentin , flexeril , and meloxicam  Follow up via telehealth in 1  month.  Myofascial low back pain I will look into aquatherapy in [place] to work on your back and legs; if not, we will send you to regular PT --found that Protherapy in [place] [employer] with pool at [facility], sent referral for evaluation.  Greater trochanteric bursitis of  left hip Follow up in 1-2 weeks for left greater trochanteric bursa injection. You can also use heat on your hip if needed.   Other orders -     traMADol  HCl; Take 1 tablet (50 mg total) by mouth every 12 (twelve) hours as needed for severe pain (pain score 7-10).  Dispense: 60 tablet; Refill: 2

## 2024-07-22 ENCOUNTER — Encounter: Attending: Registered Nurse | Admitting: Physical Medicine and Rehabilitation

## 2024-07-22 DIAGNOSIS — M545 Low back pain, unspecified: Secondary | ICD-10-CM | POA: Insufficient documentation

## 2024-07-22 DIAGNOSIS — G8929 Other chronic pain: Secondary | ICD-10-CM | POA: Insufficient documentation

## 2024-07-22 DIAGNOSIS — Z029 Encounter for administrative examinations, unspecified: Secondary | ICD-10-CM | POA: Insufficient documentation

## 2024-07-22 DIAGNOSIS — M5441 Lumbago with sciatica, right side: Secondary | ICD-10-CM | POA: Insufficient documentation

## 2024-07-22 DIAGNOSIS — G894 Chronic pain syndrome: Secondary | ICD-10-CM | POA: Insufficient documentation

## 2024-08-05 ENCOUNTER — Encounter: Admitting: Physical Medicine and Rehabilitation

## 2024-08-05 DIAGNOSIS — L600 Ingrowing nail: Secondary | ICD-10-CM | POA: Insufficient documentation

## 2024-08-05 DIAGNOSIS — G894 Chronic pain syndrome: Secondary | ICD-10-CM

## 2024-08-05 DIAGNOSIS — Z029 Encounter for administrative examinations, unspecified: Secondary | ICD-10-CM | POA: Diagnosis not present

## 2024-08-05 DIAGNOSIS — G8929 Other chronic pain: Secondary | ICD-10-CM

## 2024-08-05 DIAGNOSIS — M5441 Lumbago with sciatica, right side: Secondary | ICD-10-CM | POA: Diagnosis not present

## 2024-08-05 DIAGNOSIS — M545 Low back pain, unspecified: Secondary | ICD-10-CM

## 2024-08-05 MED ORDER — TRAMADOL HCL 50 MG PO TABS: 50.0000 mg | ORAL_TABLET | Freq: Three times a day (TID) | ORAL | 2 refills | Status: DC

## 2024-08-05 NOTE — Progress Notes (Signed)
 Virtual Visit via Video Note  I connected with Beverly Tran on 08/05/24 at 10:40 AM EDT by a video enabled telemedicine application and verified that I am speaking with the correct person using two identifiers.  Location: Patient: home Provider: office   I discussed the limitations of evaluation and management by telemedicine and the availability of in person appointments. The patient expressed understanding and agreed to proceed.    The patient was advised to call back or seek an in-person evaluation if the symptoms worsen or if the condition fails to improve as anticipated.  Transitioned to phone visit after 1 minute due to technical difficulties.   I provided 15 minutes of non-face-to-face time during this encounter.                                                           PROGRESS NOTE   Subjective/Complaints:  Beverly Tran is a 53 y.o. year old female  who  has a past medical history of Anxiety, Arthritis, Asthma, Back pain, chronic, Depression, Diabetes mellitus without complication (HCC), GERD (gastroesophageal reflux disease), High cholesterol, Hypertension, IBS (irritable bowel syndrome), and Migraine.    They are presenting to PM&R clinic for follow up related to bilateral low back and hip, extending into RIGHT posterior thigh; left trochanteric bursitis  Plan from last visit:    Chronic pain syndrome Chronic bilateral low back pain with right-sided sciatica Right sided radicular pains now following L4 dermatome   MRI lumbar spine 2022 showing degenerative changes most pronounced at the facet joints of L4-5 with mild spinal canal stenosis, mild right and moderate left neuroforaminal narrowing, and mild bilateral neuroforaminal narrowing at L3-4.   I will prescribe 1 month of tramadol  50 mg twice daily as needed    Continue gabapentin , flexeril , and meloxicam   Follow up via telehealth in 1  month.   Myofascial low back pain I will look into  aquatherapy in [place] to work on your back and legs; if not, we will send you to regular PT --found that Protherapy in [place] [employer] with pool at [facility], sent referral for evaluation.   Greater trochanteric bursitis of left hip Follow up in 1-2 weeks for left greater trochanteric bursa injection. You can also use heat on your hip if needed.   Interval Hx:  She was in the hospital a few weeks ago for pneumonia and then 4-5 days ago she went back in for respiratory failure. She was prescribed an antibiotic and that has caused severe diarrhea. She says her pneumonia is totally gone according to recent labs.   Tramadol  50 mg taking as prescribed - last 2 weeks her pain has been severe. She says it is getting worse in the middle to lower part of her back and both hips; L still hurts worse than the left. She missed her GTB injection due to hospitalizations.   She has not started PT in [place]; she says she never got a call but she recently did change phone #s. She says her lower back is hurting so bad she can barely walk down the hall.  She heard her infection came from a bad ingrown toenail. Her PCP is managing it currenty; She denies sensory loss or other Hx wounds in her feet.   ROS: Denies fevers, chills, N/V,  abdominal pain, constipation, diarrhea, SOB, cough, chest pain, new weakness or paraesthesias.    Objective:   No results found. No results for input(s): WBC, HGB, HCT, PLT in the last 72 hours. No results for input(s): NA, K, CL, CO2, GLUCOSE, BUN, CREATININE, CALCIUM  in the last 72 hours. @INTAKEOUTPUTBRIEF @      Physical Exam: PE: Constitution: Appropriate appearance for age. No apparent distress  +Obese Resp: No respiratory distress. No accessory muscle usage.  Cardio: Well perfused appearance.  Abdomen: Nondistended. Psych: Appropriate mood and affect. Neuro: AAOx4. No apparent cognitive deficits   Neurologic Exam:   Moving all 4 extremities antigravity  and against resistance Coordination: Fine motor coordination was normal.    Assessment/Plan:  Assessment and Plan  Chronic Pain Low back pain Hip Pain  Reschedule GTB injection with the office  We will have PT call your new phone #  Increase tramadol  to 50 mg TID PRN; prescribed 3 months today  Follow up with Fidela in 3 monts      Joesph JAYSON Likes 08/05/2024, 11:30 AM

## 2024-08-05 NOTE — Patient Instructions (Signed)
  Reschedule GTB injection with the office  We will have PT call your new phone #  Increase tramadol  to 50 mg TID PRN; prescribed 3 months today  Follow up with Fidela in 3 monts

## 2024-08-13 NOTE — Addendum Note (Signed)
 Addended by: EMELINE SEARCH on: 08/13/2024 02:58 PM   Modules accepted: Orders

## 2024-08-19 ENCOUNTER — Encounter: Admitting: Physical Medicine and Rehabilitation

## 2024-08-22 ENCOUNTER — Telehealth: Payer: Self-pay | Admitting: Neurology

## 2024-08-22 NOTE — Telephone Encounter (Signed)
 Pt call in this afternoon and she is concern because , she keeps  falling down a lot. Please call . Thanks

## 2024-08-22 NOTE — Telephone Encounter (Signed)
 Pt went to see her PCP yesterday she seen the PA when she was leaving she said her legs gave out at the doctors office and she fell scraped her knees no other injuries. She said she was told to let her neurologist know about the fall. When she got home she said the legs gave out again no injuries from that fall. Pt stated that she was not dizzy with either fall her legs just gave out,

## 2024-08-28 ENCOUNTER — Telehealth: Payer: Self-pay | Admitting: Neurology

## 2024-08-28 NOTE — Telephone Encounter (Signed)
 Patient advised of Dr.Aquino note below, Patient scheduled for 11/14 at 1:30 she will call transportation now. To make sure they  can bring her.

## 2024-08-28 NOTE — Telephone Encounter (Signed)
 I have an opening tomorrow at 8:30am or Friday at 1:30pm to evaluate her symptoms, thanks

## 2024-08-28 NOTE — Telephone Encounter (Signed)
 See other phone note, patient scheduled for 11/14

## 2024-08-28 NOTE — Telephone Encounter (Signed)
 Pt called in this morning. Pt stated that she wants [name] to return her call about her falling. Thanks

## 2024-08-30 ENCOUNTER — Ambulatory Visit: Admitting: Neurology

## 2024-08-30 ENCOUNTER — Encounter: Payer: Self-pay | Admitting: Neurology

## 2024-08-30 VITALS — BP 142/86 | HR 104 | Ht 63.0 in | Wt 198.8 lb

## 2024-08-30 DIAGNOSIS — E0842 Diabetes mellitus due to underlying condition with diabetic polyneuropathy: Secondary | ICD-10-CM

## 2024-08-30 DIAGNOSIS — M545 Low back pain, unspecified: Secondary | ICD-10-CM

## 2024-08-30 DIAGNOSIS — G8929 Other chronic pain: Secondary | ICD-10-CM

## 2024-08-30 DIAGNOSIS — G2581 Restless legs syndrome: Secondary | ICD-10-CM

## 2024-08-30 DIAGNOSIS — R296 Repeated falls: Secondary | ICD-10-CM

## 2024-08-30 MED ORDER — PRAMIPEXOLE DIHYDROCHLORIDE 1 MG PO TABS: ORAL_TABLET | ORAL | 3 refills | Status: AC

## 2024-08-30 MED ORDER — CLONAZEPAM 0.5 MG PO TABS: 0.5000 mg | ORAL_TABLET | Freq: Two times a day (BID) | ORAL | 5 refills | Status: AC

## 2024-08-30 MED ORDER — NORTRIPTYLINE HCL 50 MG PO CAPS: ORAL_CAPSULE | ORAL | 11 refills | Status: DC
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
## 2024-08-30 NOTE — Patient Instructions (Signed)
 Good to see you.  Schedule MRI lumbar spine without contrast at [facility]  2. Depending on MRI results, we may do a nerve and muscle test  3. Continue on all your medications. Discuss continued pain with restless legs with your Pain specialist  4. Please discuss Physical therapy with your Pain specialist for the frequent falls  5. Follow-up in 6 months, call for any changes

## 2024-09-16 ENCOUNTER — Other Ambulatory Visit: Payer: Self-pay | Admitting: Physical Medicine and Rehabilitation

## 2024-09-17 ENCOUNTER — Ambulatory Visit: Admitting: Podiatry

## 2024-09-18 ENCOUNTER — Encounter: Payer: Self-pay | Admitting: Physical Medicine and Rehabilitation

## 2024-09-18 ENCOUNTER — Encounter: Attending: Registered Nurse | Admitting: Physical Medicine and Rehabilitation

## 2024-09-18 VITALS — BP 136/81 | HR 107 | Ht 63.0 in | Wt 202.4 lb

## 2024-09-18 DIAGNOSIS — G8929 Other chronic pain: Secondary | ICD-10-CM | POA: Diagnosis present

## 2024-09-18 DIAGNOSIS — G8911 Acute pain due to trauma: Secondary | ICD-10-CM | POA: Diagnosis not present

## 2024-09-18 DIAGNOSIS — M5441 Lumbago with sciatica, right side: Secondary | ICD-10-CM | POA: Insufficient documentation

## 2024-09-18 DIAGNOSIS — G894 Chronic pain syndrome: Secondary | ICD-10-CM | POA: Insufficient documentation

## 2024-09-18 DIAGNOSIS — M7062 Trochanteric bursitis, left hip: Secondary | ICD-10-CM | POA: Diagnosis not present

## 2024-09-18 DIAGNOSIS — M545 Low back pain, unspecified: Secondary | ICD-10-CM | POA: Insufficient documentation

## 2024-09-18 MED ORDER — LIDOCAINE HCL 1 % IJ SOLN
2.0000 mL | Freq: Once | INTRAMUSCULAR | Status: AC
  Administered 2024-09-18: 2 mL

## 2024-09-18 MED ORDER — TRAMADOL HCL 50 MG PO TABS: 100.0000 mg | ORAL_TABLET | Freq: Two times a day (BID) | ORAL | 2 refills | Status: AC | PRN

## 2024-09-18 MED ORDER — TRIAMCINOLONE ACETONIDE 40 MG/ML IJ SUSP
40.0000 mg | Freq: Once | INTRAMUSCULAR | Status: AC
  Administered 2024-09-18: 40 mg

## 2024-09-18 NOTE — Progress Notes (Signed)
 HPI: Beverly Tran is a 53 y.o. female with PMHx has Upper airway cough syndrome vs AB; Essential hypertension; Chronic diarrhea; Dysphagia; Gastroesophageal reflux disease without esophagitis; Multifocal pneumonia; Leukocytosis; Type 2 diabetes mellitus with hyperlipidemia (HCC); Hypoalbuminemia due to protein-calorie malnutrition; Lactic acidosis; Elevated brain natriuretic peptide (BNP) level; COPD with acute exacerbation (HCC); Acute on chronic respiratory failure with hypoxia and hypercapnia (HCC); Chronic obstructive pulmonary disease (HCC); Anxiety; Empyema (HCC); Class 2 obesity; Iron deficiency anemia; Hypokalemia; Empyema lung (HCC); Encounter for opiate analgesic use agreement; MRSA bacteremia; Rib pain on right side; Chronic respiratory failure with hypoxia and hypercapnia (HCC); Unresolved grief; Seizure (HCC); Acute metabolic encephalopathy; AKI (acute kidney injury); Transient hypotension; Hypocalcemia; Chronic back pain; RLS (restless legs syndrome); Tobacco abuse; Chronic pain syndrome; Myofascial low back pain; Extremity cyanosis; and Ingrown toenail on their problem list. who presents to clinic for treatment of pain related to left hip  via injection as described below.   Pt states she fell a week ago and had increased pain in her low back. No radicular symptoms or weakness. Landed on her butt. Neurology ordered Mri of her back which is pending. She says they wanted to ask if our office can switch her to methadone for pain management; she says tramadol  helps but not enough. She uses flexeril  consistently.   Physical Exam:  General: Appropriate appearance for age.  Mental Status: Appropriate mood and affect.  Cardiovascular: RRR, no m/r/g.  Respiratory: CTAB, no rales/rhonchi/wheezing.  Skin: No apparent rashes or lesions.  Neuro: Awake, alert, and oriented x3. No apparent deficits. No sensory loss, reflexes intct.  MSK:   Moving all 4 limbs antigravity and against resistance. +  TTP L lateral hip, localized over midline ~l4-5  PROCEDURE:  Left  great er trochanteric bursa injection Diagnosis:    ICD-10-CM   1. Greater trochanteric bursitis of left hip  M70.62 lidocaine  (XYLOCAINE ) 1 % (with pres) injection 2 mL    triamcinolone acetonide (KENALOG-40) injection 40 mg    2. Chronic pain syndrome  G89.4     3. Chronic bilateral low back pain with right-sided sciatica  G89.29    M54.41     4. Acute low back pain due to trauma  M54.50    G89.11       Goals with treatment: [ x ] Decrease pain [ x ] Improve Active / Passive ROM [ x ] Improve ADLs [ x ] Improve functional mobility  MEDICATION:  [ x ] Kenalog 40 mg/mL  [ X ] Lidocaine  1%    CONSENT: Obtained in writing per policy. Consent uploaded to chart.  Benefits discussed.  Risks discussed included, but were not limited to, pain and discomfort, bleeding, bruising, allergic reaction, infection. All questions answered to patient/family member/guardian/ caregiver satisfaction. They would like to proceed with procedure. There are no noted contraindications to procedure.  PROCEDURE Time out was preformed No heat sources No antibiotics  The patient was explained about both the benefits and risks of a Left  Greater trochanteric bursa injection. After the patient acknowledged an understanding of the risks and benefits, the patient agreed to proceed. The area was first marked and then prepped in an aseptic fashion with betadine / alcohol. A 25 g, 2 inch needle was directed via a direct approach into the Left  GTB. The injection was completed with Kenalog 40 mg/ml 1 cc mixed with 2 cc of 1% lidocaine  after no blood was aspirated on pull back.  No complications were encountered. The patient  tolerated the procedure well.  Impression: HPI: Beverly Tran is a 53 y.o. female with PMHx has Upper airway cough syndrome vs AB; Essential hypertension; Chronic diarrhea; Dysphagia; Gastroesophageal reflux disease  without esophagitis; Multifocal pneumonia; Leukocytosis; Type 2 diabetes mellitus with hyperlipidemia (HCC); Hypoalbuminemia due to protein-calorie malnutrition; Lactic acidosis; Elevated brain natriuretic peptide (BNP) level; COPD with acute exacerbation (HCC); Acute on chronic respiratory failure with hypoxia and hypercapnia (HCC); Chronic obstructive pulmonary disease (HCC); Anxiety; Empyema (HCC); Class 2 obesity; Iron deficiency anemia; Hypokalemia; Empyema lung (HCC); Encounter for opiate analgesic use agreement; MRSA bacteremia; Rib pain on right side; Chronic respiratory failure with hypoxia and hypercapnia (HCC); Unresolved grief; Seizure (HCC); Acute metabolic encephalopathy; AKI (acute kidney injury); Transient hypotension; Hypocalcemia; Chronic back pain; RLS (restless legs syndrome); Tobacco abuse; Chronic pain syndrome; Myofascial low back pain; Extremity cyanosis; and Ingrown toenail on their problem list. who presents to clinic for treatment of left hip pain . They received a  Left  GTB injection as above.   PLAN: Left GTB injection - Resume Usual Activities. Notify Physician of any unusual bleeding, erythema or concern for side effects as reviewed above. - Apply ice prn for pain - Tylenol  prn for pain  Recent fall, increased low back pain - Increased Tramadol  to 100 mg BID for pain; follow up with Fidela in 1 month - Get MRI of low back for pain s/p fall; no acute findings on exam concerning for fracture or trauma  - Continue flexeril  at current dose - Our office does not do cardiac monitoring necessary for methadone, but we can consider buprenorphine for pain control if no improvements with increased tramadol    Patient/Care Giver was ready to learn without apparent learning barriers. Education was provided on diagnosis, treatment options/plan according to patient's preferred learning style. Patient/Care Giver verbalized understanding and agreement with the above plan.   Joesph JAYSON Likes, DO 09/18/2024

## 2024-09-18 NOTE — Patient Instructions (Addendum)
-   Resume Usual Activities. Notify Physician of any unusual bleeding, erythema or concern for side effects as reviewed above. - Apply ice prn for pain - Tylenol  prn for pain - Increased Tramadol  to 100 mg BID for pain; follow up with Fidela in 1 month - Get MRI of low back for pain s/p fall; no acute findings on exam concerning for fracture or trauma  Continue flexeril  at current dose - Our office does not do cardiac monitoring necessary for methadone, but we can consider buprenorphine for pain control if no improvements with increased tramadol

## 2024-10-02 ENCOUNTER — Telehealth: Payer: Self-pay | Admitting: Neurology

## 2024-10-02 NOTE — Telephone Encounter (Signed)
 PCP office called as Pt would like to rqst MRI order to be sent to [facility] and contact Pt once rerouted

## 2024-10-03 ENCOUNTER — Telehealth: Payer: Self-pay

## 2024-10-03 ENCOUNTER — Telehealth: Payer: Self-pay | Admitting: Physical Medicine and Rehabilitation

## 2024-10-03 DIAGNOSIS — M545 Low back pain, unspecified: Secondary | ICD-10-CM

## 2024-10-03 MED ORDER — CYCLOBENZAPRINE HCL 10 MG PO TABS: 10.0000 mg | ORAL_TABLET | Freq: Three times a day (TID) | ORAL | 2 refills | Status: AC | PRN

## 2024-10-03 NOTE — Telephone Encounter (Signed)
 Patient calling into the office to request an rx for Flexeril .  Patient states refilling this medication was discussed during last office visit and was told to call office is she felt a refill was needed.  Patient advised that Dr. Emeline is not in the office today and I will send a message to the Provider for review.  Patient verbalized understanding.

## 2024-10-03 NOTE — Telephone Encounter (Signed)
 refilled

## 2024-10-04 NOTE — Telephone Encounter (Signed)
 Order faxed to [facility] pt called

## 2024-10-07 ENCOUNTER — Telehealth: Payer: Self-pay | Admitting: Physical Medicine and Rehabilitation

## 2024-10-07 NOTE — Telephone Encounter (Signed)
 It's been almost 3 weeks since the injection - please call to clarify  1) if it improved initially and is now worsening (which would be normal) OR if it has been worsening from the day of injection  2) if it's the same pain as she had prior or different (ex any associated weakness, burning, rash, sensitivity, etc. Developing after injection).

## 2024-10-07 NOTE — Telephone Encounter (Signed)
 Pt called and said the injection you gave her left hip is not working and making it worse.

## 2024-10-09 NOTE — Telephone Encounter (Signed)
 Called back and discussed concerns with patient, she states that approximately 2 weeks after her hip injection she started to have increased burning pain running down the lateral part of her thigh and into the lower part of her leg.  She denies any injuries or falls.  Has not had this before.  She does have an MRI pending of her low back already.  I advised symptoms are likely unrelated to the injection and sound more radicular from her back, agree with getting an MRI and reviewed her medications.  She is already on 600 mg gabapentin  3 times daily, so recommend adding over-the-counter lidocaine  cream up to 4 times daily for localized symptomatic control until etiology can be determined.  She denied any concerning symptoms such as falls, weakness, or loss of bowel or bladder control.  No additional workup needed at this time.

## 2024-10-11 ENCOUNTER — Telehealth: Payer: Self-pay

## 2024-10-11 ENCOUNTER — Other Ambulatory Visit: Payer: Self-pay

## 2024-10-11 MED ORDER — NORTRIPTYLINE HCL 50 MG PO CAPS: ORAL_CAPSULE | ORAL | 11 refills | Status: AC

## 2024-10-11 NOTE — Telephone Encounter (Signed)
 PT MRI PA approved  Auth Number [id number] Date 10/04/24-11/18/2024

## 2024-10-29 ENCOUNTER — Encounter: Admitting: Registered Nurse

## 2024-10-31 ENCOUNTER — Encounter: Admitting: Registered Nurse

## 2024-11-06 ENCOUNTER — Encounter: Attending: Registered Nurse | Admitting: Registered Nurse

## 2024-11-06 DIAGNOSIS — M5441 Lumbago with sciatica, right side: Secondary | ICD-10-CM | POA: Insufficient documentation

## 2024-11-06 DIAGNOSIS — G8911 Acute pain due to trauma: Secondary | ICD-10-CM | POA: Insufficient documentation

## 2024-11-06 DIAGNOSIS — M7062 Trochanteric bursitis, left hip: Secondary | ICD-10-CM | POA: Insufficient documentation

## 2024-11-06 DIAGNOSIS — G8929 Other chronic pain: Secondary | ICD-10-CM | POA: Insufficient documentation

## 2024-11-06 DIAGNOSIS — M545 Low back pain, unspecified: Secondary | ICD-10-CM | POA: Insufficient documentation

## 2024-11-06 DIAGNOSIS — G894 Chronic pain syndrome: Secondary | ICD-10-CM | POA: Insufficient documentation

## 2024-11-14 ENCOUNTER — Ambulatory Visit: Attending: Nurse Practitioner | Admitting: Nurse Practitioner

## 2024-11-14 NOTE — Progress Notes (Unsigned)
" °  Cardiology Office Note   Date:  11/14/2024  ID:  Beverly Tran, DOB 1970/10/22, MRN [id number] PCP: Alston Silvio BROCKS, FNP  [facility] Providers Cardiologist:  Alvan Carrier, MD { Click to update primary MD,subspecialty MD or APP then REFRESH:1}    History of Present Illness Beverly Tran is a 54 y.o. female with a PMH of shortness of breath, hx of CVA, T2DM, hypercholesterolemia, GERD, IBS, anxiety, depression, chronic back pain, COPD, and asthma, who presents today for scheduled follow-up.   Last seen by Dr. Alvan on 05/13/2024. It was stated that her SOB appeared to be related to her COPD, was noted to be O2 dependent. Was noted to have some evidence of pulmonary edema by imaging at prior admissions. Noted to have some occasional leg edema at times, though was doing well at the time. Echo 02/2024 at [facility] was benign. Pt stopped benicar  hydrochlorothiazide  due to BP's at [facility], was started back on hydrochlorothiazide  12.5 mg daily.   Here for 6 month follow-up appt.   ROS: ***  Studies Reviewed      *** Risk Assessment/Calculations {Does this patient have ATRIAL FIBRILLATION?:[phone]} No BP recorded.  {Refresh Note OR Click here to enter BP  :1}***       Physical Exam VS:  LMP 10/13/2015        Wt Readings from Last 3 Encounters:  09/18/24 202 lb 6.4 oz (91.8 kg)  08/30/24 198 lb 12.8 oz (90.2 kg)  07/08/24 205 lb 3.2 oz (93.1 kg)    GEN: Well nourished, well developed in no acute distress NECK: No JVD; No carotid bruits CARDIAC: ***RRR, no murmurs, rubs, gallops RESPIRATORY:  Clear to auscultation without rales, wheezing or rhonchi  ABDOMEN: Soft, non-tender, non-distended EXTREMITIES:  No edema; No deformity   ASSESSMENT AND PLAN ***    {Are you ordering a CV Procedure (e.g. stress test, cath, DCCV, TEE, etc)?   Press F2        :[id number]}  Dispo: ***  Signed, Almarie Crate, NP   "

## 2025-01-27 ENCOUNTER — Ambulatory Visit: Admitting: Neurology

## 2025-03-04 ENCOUNTER — Ambulatory Visit: Admitting: Neurology
